# Patient Record
Sex: Female | Born: 1990
Health system: Southern US, Community
[De-identification: ages and names within clinical notes are randomized; demographics above are authoritative.]

## PROBLEM LIST (undated history)

## (undated) DIAGNOSIS — J45909 Unspecified asthma, uncomplicated: Secondary | ICD-10-CM

## (undated) DIAGNOSIS — G43909 Migraine, unspecified, not intractable, without status migrainosus: Secondary | ICD-10-CM

## (undated) DIAGNOSIS — J329 Chronic sinusitis, unspecified: Secondary | ICD-10-CM

## (undated) DIAGNOSIS — J189 Pneumonia, unspecified organism: Secondary | ICD-10-CM

## (undated) DIAGNOSIS — R569 Unspecified convulsions: Secondary | ICD-10-CM

## (undated) DIAGNOSIS — I509 Heart failure, unspecified: Secondary | ICD-10-CM

## (undated) DIAGNOSIS — R112 Nausea with vomiting, unspecified: Secondary | ICD-10-CM

## (undated) DIAGNOSIS — F419 Anxiety disorder, unspecified: Secondary | ICD-10-CM

## (undated) DIAGNOSIS — Z9889 Other specified postprocedural states: Secondary | ICD-10-CM

## (undated) DIAGNOSIS — K589 Irritable bowel syndrome without diarrhea: Secondary | ICD-10-CM

## (undated) DIAGNOSIS — I639 Cerebral infarction, unspecified: Secondary | ICD-10-CM

## (undated) DIAGNOSIS — F32A Depression, unspecified: Secondary | ICD-10-CM

## (undated) DIAGNOSIS — Z8679 Personal history of other diseases of the circulatory system: Secondary | ICD-10-CM

## (undated) DIAGNOSIS — D509 Iron deficiency anemia, unspecified: Secondary | ICD-10-CM

## (undated) DIAGNOSIS — Z8619 Personal history of other infectious and parasitic diseases: Secondary | ICD-10-CM

## (undated) DIAGNOSIS — N9489 Other specified conditions associated with female genital organs and menstrual cycle: Secondary | ICD-10-CM

## (undated) HISTORY — DX: Personal history of other diseases of the circulatory system: Z86.79

## (undated) HISTORY — PX: OTHER SURGICAL HISTORY: SHX169

## (undated) HISTORY — DX: Irritable bowel syndrome, unspecified: K58.9

## (undated) HISTORY — DX: Chronic sinusitis, unspecified: J32.9

## (undated) HISTORY — DX: Unspecified convulsions: R56.9

## (undated) HISTORY — PX: NO PAST SURGERIES: SHX2092

---

## 2013-10-22 ENCOUNTER — Emergency Department: Payer: No Typology Code available for payment source

## 2013-10-22 ENCOUNTER — Emergency Department
Admission: EM | Admit: 2013-10-22 | Discharge: 2013-10-22 | Disposition: A | Discharge status: Home / Self Care | Payer: No Typology Code available for payment source | Attending: Emergency Medicine | Admitting: Emergency Medicine

## 2013-10-22 DIAGNOSIS — F17200 Nicotine dependence, unspecified, uncomplicated: Secondary | ICD-10-CM | POA: Insufficient documentation

## 2013-10-22 DIAGNOSIS — N83209 Unspecified ovarian cyst, unspecified side: Secondary | ICD-10-CM | POA: Insufficient documentation

## 2013-10-22 DIAGNOSIS — N739 Female pelvic inflammatory disease, unspecified: Secondary | ICD-10-CM | POA: Insufficient documentation

## 2013-10-22 DIAGNOSIS — N949 Unspecified condition associated with female genital organs and menstrual cycle: Secondary | ICD-10-CM | POA: Insufficient documentation

## 2013-10-22 LAB — URINE HCG QUALITATIVE: Urine HCG Qualitative: NEGATIVE

## 2013-10-22 LAB — URINALYSIS, REFLEX TO MICROSCOPIC EXAM IF INDICATED
Bilirubin, UA: NEGATIVE
Blood, UA: NEGATIVE
Glucose, UA: NEGATIVE
Ketones UA: NEGATIVE
Leukocyte Esterase, UA: NEGATIVE
Nitrite, UA: NEGATIVE
Protein, UR: 30 — AB
Specific Gravity UA: 1.025 (ref 1.001–1.035)
Urine pH: 6 (ref 5.0–8.0)
Urobilinogen, UA: NORMAL mg/dL

## 2013-10-22 LAB — CBC AND DIFFERENTIAL
Basophils Absolute Automated: 0.03 (ref 0.00–0.20)
Basophils Automated: 0 %
Eosinophils Absolute Automated: 0.09 (ref 0.00–0.70)
Eosinophils Automated: 1 %
Hematocrit: 37.4 % (ref 37.0–47.0)
Hgb: 12.6 g/dL (ref 12.0–16.0)
Lymphocytes Absolute Automated: 2.46 (ref 0.50–4.40)
Lymphocytes Automated: 30 %
MCH: 30.1 pg (ref 28.0–32.0)
MCHC: 33.7 g/dL (ref 32.0–36.0)
MCV: 89.3 fL (ref 80.0–100.0)
MPV: 11.4 fL (ref 9.4–12.3)
Monocytes Absolute Automated: 0.79 (ref 0.00–1.20)
Monocytes: 10 %
Neutrophils Absolute: 4.75 (ref 1.80–8.10)
Neutrophils: 58 %
Platelets: 237 (ref 140–400)
RBC: 4.19 — ABNORMAL LOW (ref 4.20–5.40)
RDW: 13 % (ref 12–15)
WBC: 8.12 (ref 3.50–10.80)

## 2013-10-22 LAB — LIPASE: Lipase: 28 U/L (ref 8–78)

## 2013-10-22 LAB — COMPREHENSIVE METABOLIC PANEL
ALT: 18 U/L (ref 0–55)
AST (SGOT): 22 U/L (ref 5–34)
Albumin/Globulin Ratio: 1.3 (ref 0.9–2.2)
Albumin: 4.1 g/dL (ref 3.5–5.0)
Alkaline Phosphatase: 45 U/L (ref 40–150)
Anion Gap: 10 (ref 5.0–15.0)
BUN: 8 mg/dL (ref 7.0–19.0)
Bilirubin, Total: 0.3 mg/dL (ref 0.2–1.2)
CO2: 26 mEq/L (ref 22–29)
Calcium: 9.5 mg/dL (ref 8.5–10.5)
Chloride: 104 mEq/L (ref 98–107)
Creatinine: 0.9 mg/dL (ref 0.6–1.0)
Globulin: 3.1 g/dL (ref 2.0–3.6)
Glucose: 84 mg/dL (ref 70–100)
Potassium: 3.6 mEq/L (ref 3.5–5.1)
Protein, Total: 7.2 g/dL (ref 6.0–8.3)
Sodium: 140 mEq/L (ref 136–145)

## 2013-10-22 LAB — GFR: EGFR: >60

## 2013-10-22 MED ORDER — DOXYCYCLINE HYCLATE 100 MG PO TABS
100.0000 mg | ORAL_TABLET | Freq: Once | ORAL | Status: AC
Start: 2013-10-22 — End: 2013-10-22
  Administered 2013-10-22: 100 mg via ORAL
  Filled 2013-10-22: qty 1

## 2013-10-22 MED ORDER — IOHEXOL 350 MG/ML IV SOLN
100.0000 mL | Freq: Once | INTRAVENOUS | Status: AC | PRN
Start: 2013-10-22 — End: 2013-10-22
  Administered 2013-10-22: 100 mL via INTRAVENOUS

## 2013-10-22 MED ORDER — HYDROMORPHONE HCL PF 1 MG/ML IJ SOLN
1.0000 mg | Freq: Once | INTRAMUSCULAR | Status: AC
Start: 2013-10-22 — End: 2013-10-22
  Administered 2013-10-22: 1 mg via INTRAVENOUS
  Filled 2013-10-22: qty 1

## 2013-10-22 MED ORDER — DOXYCYCLINE HYCLATE 100 MG PO TABS
100.0000 mg | ORAL_TABLET | Freq: Two times a day (BID) | ORAL | Status: AC
Start: 2013-10-22 — End: 2013-11-05

## 2013-10-22 MED ORDER — ONDANSETRON HCL 4 MG/2ML IJ SOLN
4.0000 mg | Freq: Once | INTRAMUSCULAR | Status: AC
Start: 2013-10-22 — End: 2013-10-22
  Administered 2013-10-22: 4 mg via INTRAVENOUS
  Filled 2013-10-22: qty 2

## 2013-10-22 MED ORDER — IBUPROFEN 600 MG PO TABS
600.0000 mg | ORAL_TABLET | Freq: Four times a day (QID) | ORAL | Status: DC | PRN
Start: 2013-10-22 — End: 2014-09-20

## 2013-10-22 MED ORDER — SODIUM CHLORIDE 0.9 % IV BOLUS
1000.0000 mL | Freq: Once | INTRAVENOUS | Status: AC
Start: 2013-10-22 — End: 2013-10-22
  Administered 2013-10-22: 1000 mL via INTRAVENOUS
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
## 2013-10-22 NOTE — ED Notes (Signed)
Rectal pain for 3 days, last BM on Friday or Saturday,no rectal bleeding, pain in right lower abd for 2 days,, vomiting x1, LMP dec 13, pain with urination

## 2013-10-22 NOTE — Discharge Instructions (Signed)
Please follow up with OB/GYN on Monday.   Please return to ED in case of new or worsening symptoms.       Ovarian Cyst    You have been diagnosed with an ovarian cyst.    Ovarian cysts are a common problem in women with abdominal (belly) pain, pelvic pain, or cramping.    Ovarian cysts are a like small bubbles filled with fluid. These cysts, also known as ovarian follicles, form in the ovary during a normal menstrual cycle (period). During your cycle, an egg ripens inside the cyst to get ready for ovulation. Sometimes a follicle becomes large or many follicles form. This can cause pelvic pain or cramps.    If the cyst gets too big, it may break open. When this happens, it releases blood and fluid into the abdomen (belly). This causes sudden severe pain. This pain usually improves within a few hours without treatment. Some people need pain medication.    A few women have a condition called polycystic ovarian disease. In these women, multiple cysts form on both ovaries during the menstrual cycle. This causes irregular (unpredictable) menstrual periods. This condition is caused by hormones.    To diagnose polycystic ovarian disease, a doctor usually will examine a woman's pelvis and ask questions about her symptoms. If the doctor is unable to feel the cysts with his or her hands, an ultrasound may be necessary.    Ultrasound testing for cysts is rarely needed on an emergency basis. Instead, the test is usually done in the radiology department during regular weekday office hours. The medical staff will schedule this test in the next few days. If you prefer, you may see your regular doctor to schedule the ultrasound.    The physician has determined that it is safe for you to go home.    You may need to return here or go to the nearest Emergency Department if your symptoms signal that you are having complications like infection or bleeding.    Be sure to follow up with your gynecologist or regular doctor in the  next few days. Tell your doctor about this visit.   If you do not have a doctor or clinic to follow up with, make sure you tell the medical staff prior to leaving here today. We can help make these arrangements for you.    YOU SHOULD SEEK MEDICAL ATTENTION IMMEDIATELY, EITHER HERE OR AT THE NEAREST EMERGENCY DEPARTMENT, IF ANY OF THE FOLLOWING OCCURS:   Increasingly severe pain in the abdomen (belly), pelvis or back.   Increasingly large amounts of vaginal discharge or bleeding, or passage of large blood clots.   Fever, chills, nausea, vomiting.   Dizziness, lightheadedness, or passing out.    Pelvic Pain    You have been diagnosed with pelvic pain.    Pelvic pain affects many women who come here for evaluation. Your doctor has checked for the most dangerous causes of pelvic pain, such as appendicitis, pelvic abscess ectopic (tubal) pregnancy, or other complications of pregnancy. You do not have any of these problems.    There are many other causes of pelvic pain that cannot be detected with the type of testing that can be done here today. Some diseases, such as uterine fibroids or endometriosis, may cause abnormal vaginal bleeding along with pain. These problems often require the doctor to look at the pelvic organs in the operating room under general anesthesia.    After examining you today and conducting a few tests,  the doctor may suspect a specific cause of your pelvic pain, like infection of the uterus or fallopian tubes (known as pelvic inflammatory disease or PID). If so, treatment can be started immediately, even though it might take a few days to get all the test results back.    Cancer is a rare cause of acute pelvic pain, but it still needs to be considered as a possible cause of your symptoms. The emergency department or urgent care clinic is rarely able to diagnose or rule out cancer as the cause of pelvic pain. Cancer screening is not part of a routine emergency evaluation.    It is VERY  IMPORTANT that you follow up with your regular doctor who can evaluate all possible causes of your pain.    At this time it is safe for you to go home.    You may need to return here or go to the nearest Emergency Department if you develop symptoms that might indicate you are having complications, such as worsening infection, vaginal bleeding, or some other problem.    Be sure to follow up with your gynecologist or regular doctor in the next few days. Tell your doctor about this visit.   If you do not have a regular doctor, make sure you tell the medical staff prior to leaving here, so that we can help make these arrangements for you.    YOU SHOULD SEEK MEDICAL ATTENTION IMMEDIATELY, EITHER HERE OR AT THE NEAREST EMERGENCY DEPARTMENT, IF ANY OF THE FOLLOWING OCCURS:   Increasingly severe pain in the abdomen (belly), pelvis or back.   Increasingly large amounts of vaginal discharge or bleeding (more than one pad per hour), or passage of large blood clots.   Fever, chills, nausea, vomiting.   Dizziness, lightheadedness, or passing out.        Pelvic Inflammatory Disease    You have been diagnosed with Pelvic Inflammatory Disease, or "PID."    This is a common infection of the pelvic organs, including the uterus, fallopian tubes, ovaries and surrounding tissues.    Symptoms include irregular or no periods, pelvic pain or cramps, back pain, vaginal discharge or bleeding, nausea, vomiting, fever, chills, and urinary problems. After the infection is over you may still experience scarring of the fallopian tubes (the tubes that connect the ovaries to the uterus), ongoing pelvic pain, infertility (inability to get pregnant), or increased risk for ectopic (tubal) pregnancy.    The diagnosis is based on your symptoms and physical exam, especially your pelvic examination. Lab tests may be done to confirm the diagnosis, but often the results of these tests will not be available for several days. It is common for the  medical staff to start treatment before all lab test results are known. This is done in order to avoid a delay in your recovery. The final cause of infection will be known when all lab tests have come back.    The most common cause of "PID" is a sexually transmitted disease (STD) such as chlamydia or gonorrhea. These diseases are spread during sexual intercourse with a partner who already has the disease. If you have a female partner, he may have no symptoms or only a mild discharge from his penis. He may not even know he is infected!    Some women must be treated more than once to cure PID. Sometimes the PID is cured, but the woman becomes sick again because her sexual partner is still infected or she becomes involved with  a new partner who is also infected. If your tests indicate you do not have an STD, or if you do not get better after treatment, you may have another, less common cause of PID. It is VERY IMPORTANT that you get follow-up care. This will ensure your full recovery and allow you to discuss all the lab test results with your doctor.    If the tests confirm that you do have an STD, you will need to tell all current or recent sexual partners that they need to be treated. Avoid any sexual contact with an infected partner until he has been fully treated. Otherwise, you may become infected again.    Use "safe sex" precautions during sexual intercourse. Use a condom that contains a spermicidal jelly, in order to reduce the chance of further infections. Remember that condoms help but they cannot guarantee you will not get an infection or become pregnant.    You should be tested for other STDs that may not have been checked for today. These include syphilis, hepatitis B, and HIV (AIDS virus). You can have this testing done by your regular doctor or clinic or by going to your local community health department.    At this time it is safe for you to go home.    You may need to return here or go to the  nearest Emergency Department if you develop further symptoms, including worsening of the infection, dehydration, fevers or bleeding.    Be sure to follow up with your gynecologist or family doctor in a few days and tell your doctor about this visit.   If you do not have a regular doctor, make sure you tell the medical staff prior to leaving here today. We can help make these arrangements for you.    YOU SHOULD SEEK MEDICAL ATTENTION IMMEDIATELY, EITHER HERE OR AT THE NEAREST EMERGENCY DEPARTMENT, IF ANY OF THE FOLLOWING OCCURS:   Severe pain in the abdomen (belly), pelvis or back.   Large amounts of vaginal bleeding, soaking of pads/tampons (more than one pad per hour), or passage of large clots.   Fever, chills, nausea and vomiting.   Dizziness, lightheadedness, or passing out.

## 2013-10-27 NOTE — Progress Notes (Signed)
Quick Note:    Negative culture. No follow-up needed.  ______

## 2014-01-06 ENCOUNTER — Inpatient Hospital Stay: Payer: No Typology Code available for payment source | Admitting: Cardiovascular Disease

## 2014-01-06 ENCOUNTER — Observation Stay
Admission: EM | Admit: 2014-01-06 | Discharge: 2014-01-09 | DRG: 552 | Disposition: A | Discharge status: Home / Self Care | Payer: No Typology Code available for payment source | Attending: Internal Medicine | Admitting: Internal Medicine

## 2014-01-06 DIAGNOSIS — M79609 Pain in unspecified limb: Secondary | ICD-10-CM | POA: Diagnosis present

## 2014-01-06 DIAGNOSIS — W208XXA Other cause of strike by thrown, projected or falling object, initial encounter: Secondary | ICD-10-CM | POA: Diagnosis present

## 2014-01-06 DIAGNOSIS — G43909 Migraine, unspecified, not intractable, without status migrainosus: Secondary | ICD-10-CM | POA: Diagnosis present

## 2014-01-06 DIAGNOSIS — Y9229 Other specified public building as the place of occurrence of the external cause: Secondary | ICD-10-CM

## 2014-01-06 DIAGNOSIS — F17200 Nicotine dependence, unspecified, uncomplicated: Secondary | ICD-10-CM | POA: Diagnosis present

## 2014-01-06 DIAGNOSIS — R112 Nausea with vomiting, unspecified: Secondary | ICD-10-CM | POA: Diagnosis present

## 2014-01-06 DIAGNOSIS — R519 Headache, unspecified: Secondary | ICD-10-CM

## 2014-01-06 DIAGNOSIS — S139XXA Sprain of joints and ligaments of unspecified parts of neck, initial encounter: Principal | ICD-10-CM | POA: Diagnosis present

## 2014-01-06 DIAGNOSIS — H532 Diplopia: Secondary | ICD-10-CM | POA: Diagnosis present

## 2014-01-06 DIAGNOSIS — Z880 Allergy status to penicillin: Secondary | ICD-10-CM

## 2014-01-06 DIAGNOSIS — S0990XA Unspecified injury of head, initial encounter: Secondary | ICD-10-CM | POA: Diagnosis present

## 2014-01-06 NOTE — ED Notes (Signed)
Frontal headache, started on Saturday, + nausea, vomited on Sunday, pt reports left sided neck pain that shoots down to shoulder started today, noted episode of double vision around 1200

## 2014-01-07 ENCOUNTER — Inpatient Hospital Stay: Payer: No Typology Code available for payment source

## 2014-01-07 ENCOUNTER — Emergency Department: Payer: No Typology Code available for payment source

## 2014-01-07 DIAGNOSIS — H532 Diplopia: Secondary | ICD-10-CM | POA: Diagnosis present

## 2014-01-07 LAB — CBC AND DIFFERENTIAL
Basophils Absolute Automated: 0.03 10*3/uL (ref 0.00–0.20)
Basophils Automated: 0 %
Eosinophils Absolute Automated: 0.2 10*3/uL (ref 0.00–0.70)
Eosinophils Automated: 2 %
Hematocrit: 35.2 % — ABNORMAL LOW (ref 37.0–47.0)
Hgb: 12.3 g/dL (ref 12.0–16.0)
Lymphocytes Absolute Automated: 4.06 10*3/uL (ref 0.50–4.40)
Lymphocytes Automated: 51 %
MCH: 31.2 pg (ref 28.0–32.0)
MCHC: 34.9 g/dL (ref 32.0–36.0)
MCV: 89.3 fL (ref 80.0–100.0)
MPV: 11.8 fL (ref 9.4–12.3)
Monocytes Absolute Automated: 0.66 10*3/uL (ref 0.00–1.20)
Monocytes: 8 %
Neutrophils Absolute: 2.97 10*3/uL (ref 1.80–8.10)
Neutrophils: 38 %
Platelets: 205 10*3/uL (ref 140–400)
RBC: 3.94 10*6/uL — ABNORMAL LOW (ref 4.20–5.40)
RDW: 13 % (ref 12–15)
WBC: 7.92 10*3/uL (ref 3.50–10.80)

## 2014-01-07 LAB — URINALYSIS, REFLEX TO MICROSCOPIC EXAM IF INDICATED
Bilirubin, UA: NEGATIVE
Blood, UA: NEGATIVE
Glucose, UA: NEGATIVE
Ketones UA: NEGATIVE
Leukocyte Esterase, UA: NEGATIVE
Nitrite, UA: NEGATIVE
Protein, UR: NEGATIVE
Specific Gravity UA: 1.015 (ref 1.001–1.035)
Urine pH: 6 (ref 5.0–8.0)
Urobilinogen, UA: NORMAL mg/dL

## 2014-01-07 LAB — COMPREHENSIVE METABOLIC PANEL
ALT: 14 U/L (ref 0–55)
AST (SGOT): 17 U/L (ref 5–34)
Albumin/Globulin Ratio: 1.3 (ref 0.9–2.2)
Albumin: 3.7 g/dL (ref 3.5–5.0)
Alkaline Phosphatase: 50 U/L (ref 40–150)
Anion Gap: 7 (ref 5.0–15.0)
BUN: 13 mg/dL (ref 7.0–19.0)
Bilirubin, Total: 0.2 mg/dL (ref 0.2–1.2)
CO2: 25 mEq/L (ref 22–29)
Calcium: 8.9 mg/dL (ref 8.5–10.5)
Chloride: 108 mEq/L — ABNORMAL HIGH (ref 98–107)
Creatinine: 0.9 mg/dL (ref 0.6–1.0)
Globulin: 2.8 g/dL (ref 2.0–3.6)
Glucose: 85 mg/dL (ref 70–100)
Potassium: 3.9 mEq/L (ref 3.5–5.1)
Protein, Total: 6.5 g/dL (ref 6.0–8.3)
Sodium: 140 mEq/L (ref 136–145)

## 2014-01-07 LAB — HCG, SERUM, QUALITATIVE: Hcg Qualitative: NEGATIVE

## 2014-01-07 LAB — GFR: EGFR: >60

## 2014-01-07 MED ORDER — ONDANSETRON HCL 4 MG/2ML IJ SOLN
4.0000 mg | Freq: Once | INTRAMUSCULAR | Status: AC
Start: 2014-01-07 — End: 2014-01-07
  Administered 2014-01-07: 4 mg via INTRAVENOUS
  Filled 2014-01-07: qty 2

## 2014-01-07 MED ORDER — GADOBUTROL 1 MMOL/ML IV SOLN
6.0000 mL | Freq: Once | INTRAVENOUS | Status: AC | PRN
Start: 2014-01-07 — End: 2014-01-07
  Administered 2014-01-07: 6 mmol via INTRAVENOUS

## 2014-01-07 MED ORDER — IOHEXOL 350 MG/ML IV SOLN
100.0000 mL | Freq: Once | INTRAVENOUS | Status: AC | PRN
Start: 2014-01-07 — End: 2014-01-07
  Administered 2014-01-07: 100 mL via INTRAVENOUS

## 2014-01-07 MED ORDER — ONDANSETRON HCL 4 MG/2ML IJ SOLN
4.0000 mg | Freq: Three times a day (TID) | INTRAMUSCULAR | Status: AC | PRN
Start: 2014-01-07 — End: 2014-01-08
  Administered 2014-01-07 – 2014-01-08 (×2): 4 mg via INTRAVENOUS
  Filled 2014-01-07 (×2): qty 2

## 2014-01-07 MED ORDER — HYDROMORPHONE HCL PF 1 MG/ML IJ SOLN
0.5000 mg | Freq: Once | INTRAMUSCULAR | Status: AC
Start: 2014-01-07 — End: 2014-01-07
  Administered 2014-01-07: 0.5 mg via INTRAVENOUS
  Filled 2014-01-07: qty 1

## 2014-01-07 MED ORDER — HYDROMORPHONE HCL PF 1 MG/ML IJ SOLN
1.0000 mg | INTRAMUSCULAR | Status: AC | PRN
Start: 2014-01-07 — End: 2014-01-08
  Administered 2014-01-07 – 2014-01-08 (×3): 1 mg via INTRAVENOUS
  Filled 2014-01-07 (×3): qty 1

## 2014-01-07 MED ORDER — BUTALBITAL-APAP-CAFFEINE 50-325-40 MG PO TABS
1.0000 | ORAL_TABLET | Freq: Four times a day (QID) | ORAL | Status: DC | PRN
Start: 2014-01-07 — End: 2014-01-09
  Administered 2014-01-07 – 2014-01-09 (×4): 1 via ORAL
  Filled 2014-01-07 (×5): qty 1

## 2014-01-07 MED ORDER — HYDROMORPHONE HCL PF 1 MG/ML IJ SOLN
1.0000 mg | Freq: Once | INTRAMUSCULAR | Status: AC
Start: 2014-01-07 — End: 2014-01-07
  Administered 2014-01-07: 1 mg via INTRAVENOUS
  Filled 2014-01-07: qty 1

## 2014-01-07 MED ORDER — KETOROLAC TROMETHAMINE 30 MG/ML IJ SOLN
30.0000 mg | Freq: Once | INTRAMUSCULAR | Status: AC
Start: 2014-01-07 — End: 2014-01-07
  Administered 2014-01-07: 30 mg via INTRAVENOUS
  Filled 2014-01-07: qty 1

## 2014-01-07 NOTE — Treatment Plan (Signed)
VTE/PE Risk Screening  Complete Upon Admission and Transfer to Different Level of Care  Completed by nurse: Bevely Hackbart 01/07/2014 4:34 AM   -----------------------------------------------------------------------------------------------------------  SECTION 1 - Risk Screening     [x]   Patient currently receiving anticoagulation therapy (Heparin, Lovenox, Coumadin, Pradaxa, Xarelto, Eliquis or Arixtra Only) and received 1 dose within 24 hours of admission STOP HERE   []   VTE/PE prophylaxis currently prescribed elsewhere - STOP HERE   []   Comfort Care - STOP HERE   []   Clinical Trials - STOP HERE    Contraindications: Patients with a history of the following conditions cannot haveSequential compression devices (SCD     []  Any of these conditions present , Call MD for pharmacological prophylaxis or ask MD to document reason for not having both mechanical and pharmacologic VTE prophylaxis   []  Post-op vein ligation   []  Suspected VTE   []  Cellulitis/Dermatitis of the leg   []  Severe ischemic Vascular disease   []  Edema related to Congestive Heart Faliure   []  Gangrene   []  Recent skin graft  -----------------------------------------------------------------------------------------------------------  SECTION 2 - Risk Factors (Check all that apply)    Moderate Risk Factors   []   Heart Failure (current or history of)   []   Respiratory Failure   []   Acute Myocardial Infarction (AMI)   []   Acute Infection   []   Rheumatologic Disorder   []   Elderly age (23 years old)   []   Ongoing hormonal treatment / estrogen use (including Tamoxifen, Raloxifene)   []   Obesity (BMI >/= 30kg/m2)    High Risk Factors   []   Recent (</= 1 month) trauma/surgery    Highest Risk Factors   []   Active Cancer   []   Previous VTE   [x]   Reduced mobility (>24 hrs; current or anticipated)   []   Known thrombophilic condition (hematological disorders that promote thrombosis)      * No surgery found * []   No boxes checked in this section indicate patient is  at low risk for VTE. No VTE Prophylaxis indicated.      * No surgery found * [x]   One or more risk factors present, enter an EPIC order for Sequential compression devices (SCD). Use per protocol, MD signature required.

## 2014-01-07 NOTE — Consults (Signed)
Service Date: 01/07/2014     Patient Type: I     CONSULTING PHYSICIAN: Cathe Mons MD     REFERRING PHYSICIAN:      REASON FOR CONSULTATION:  1.  Diplopia  2.  Headache.  3.  Neck pain.  4.  Arm pain, all after recent head trauma.     HISTORY OF PRESENT ILLNESS:  The patient is a 23 year old African American lady, who got injured at her  workplace after about 10 to 12 shoe boxes fell onto her head.  At that  time, she did not feel much of symptoms.  She felt fine for the rest of the  day.  She did not lose consciousness.  The next day she started developing  bitemporal severe throbbing headache, which persistently worsened, and then  she started developing neck pain shooting into her left arm.  In addition,  she started getting diplopia and some balance and gait issues, and she came  into the emergency department.  Currently she reports no numbness or  tingling, paresthesias.  She continues to experience all the above symptoms  that I have detailed.  She reports no bowel or bladder incontinence.  She  also has some nausea and vomiting.     PAST SURGICAL HISTORY:  Nothing significant.     PAST SURGICAL HISTORY:  Nothing significant.     DRUG ALLERGIES:  PENICILLIN.     FAMILY HISTORY:  No family history of stroke or seizures.     SOCIAL HISTORY:  Light smoker.     REVIEW OF SYSTEMS:  CONSTITUTIONAL:  Fatigue.  OPHTHALMOLOGICAL:  Diplopia.  ENT:  Neck pain.  CARDIAC:  No chest pain.  RESPIRATORY:  No shortness of breath.  GASTROINTESTINAL:  Nausea, vomiting.  GENITOURINARY:  No incontinence.  MUSCULOSKELETAL:  Neck pain.  DERMATOLOGICAL:  No rashes.  HEMATOLOGICAL:  No bleeding.  ONCOLOGICAL:  Denies.  PSYCHIATRIC:  Denies.  NEUROLOGIC:  As above.     PHYSICAL EXAMINATION:  GENERAL:  Pleasant lady lying in bed in no distress.  VITAL SIGNS:  Blood pressure 122/77, pulse rate is 62, temperature 98.4,  pulse oximetry 97%.  HEENT:  No bruits.  LUNGS:  Clear.  HEART:  Heart sounds normal.  ABDOMEN:   Soft.  EXTREMITIES:  No edema.  Fundus examination was attempted, could not be  done because of photophobia.  NEUROLOGIC:  Awake, alert, oriented x3.  Speech is fluent.  Repetition and  comprehension normal.  Cranial nerves:  Eye movements normal.  Pupils  equal, reactive, though difficult to see the reaction because of her  photophobia.  Tongue midline.  Facial sensation reduced on the left side.   Motor examination reveals no drift, no dysmetria.  She was missing the  target though when checking for finger-to-nose testing.  Good strength in  the lower extremities.  Sensory exam reduced to light touch, temperature in  the left arm and the left leg.  Deep tendon reflexes are much brisker on  the left as compared to the right side.  Gait was deferred.  Toes were  downgoing.  MUSCULOSKELETAL:  Reveals mild tenderness on palpation of the paracervical  regions on the left side.  Spurling sign was negative.     IMPRESSION:  1.  Minor head trauma, closed head injury resulting in multiple  neurological symptoms.  Could be concussion, could be due to focal  neurological issue such as dissection resulting in stroke, could also be  due to HNP resulting in spinal  radiculopathy, cervical radiculopathy  primarily.  2.  Diplopia.  3.  Headaches.     RECOMMENDATIONS:  1.  Symptomatic therapy for nausea, vomiting, and headache.  2.  We will obtain CT angiogram of the head and neck to rule out  dissection.  3.  We will obtain MRI of the cervical spine to rule out HNP, resulting in  radiculopathy, or disk herniation resulting in nerve root compression.  4.  MRI of the brain has been done, and results are still pending.  We will  follow up on those results.     Thank you for the consultation.           D:  01/07/2014 08:53 AM by Dr. Cathe Mons, MD ([id number])  T:  01/07/2014 09:17 AM by Netta Cedars      Everlean Cherry: [id number]) (Doc ID: [id number])

## 2014-01-07 NOTE — ED Provider Notes (Signed)
EMERGENCY DEPARTMENT HISTORY AND PHYSICAL EXAM     Physician/Midlevel provider first contact with patient: 01/07/14 0121         Date: 01/06/2014  Patient Name: Wendy Ortiz    History of Presenting Illness     Chief Complaint   Patient presents with   . Headache   . Neck Pain       History Provided By: pt    Chief Complaint: HA  Onset: x3 nights   Timing: constant  Location: bilateral temporal area  Severity: moderate  Modifying Factors: none  Associated Symptoms: nv, double vision, photophobia    Additional History: Wendy Ortiz is a 23 y.o. female presenting to the ED c/o a bilateral temporal HA x3 days. While the pt was at work 3 days ago, a shelf full of ~10 shoeboxes fell on her head. She denies LOC and states she didn't have pain immediately after the incident. Her HA developed later that night and has been constant ever since. Her sxs are also associated with pain shooting down the L side of her neck, photophobia, intermittent double vision, and 1 episode of emesis 2 days ago. She denies back pain, numbness or weakness in her extremities, CP, SOB, abdominal pain, or any other sxs. Pt took Tylenol yesterday with no relief from her pain.      PCP: Pcp, Noneorunknown, MD      Current Facility-Administered Medications   Medication Dose Route Frequency Provider Last Rate Last Dose   . [COMPLETED] HYDROmorphone (DILAUDID) injection 0.5 mg  0.5 mg Intravenous Once Elliot Cousin, MD   0.5 mg at 01/07/14 0206   . [COMPLETED] HYDROmorphone (DILAUDID) injection 1 mg  1 mg Intravenous Once Elliot Cousin, MD   1 mg at 01/07/14 0232   . [COMPLETED] ketorolac (TORADOL) injection 30 mg  30 mg Intravenous Once Elliot Cousin, MD   30 mg at 01/07/14 0232   . [COMPLETED] ondansetron (ZOFRAN) injection 4 mg  4 mg Intravenous Once Elliot Cousin, MD   4 mg at 01/07/14 0206     Current Outpatient Prescriptions   Medication Sig Dispense Refill   . HydrOXYzine Pamoate (VISTARIL PO) Take by mouth.       Marland Kitchen ibuprofen  (ADVIL,MOTRIN) 600 MG tablet Take 1 tablet (600 mg total) by mouth every 6 (six) hours as needed for Pain or Fever.  30 tablet  0       Past History     Past Medical History:  History reviewed. No pertinent past medical history.    Past Surgical History:  History reviewed. No pertinent past surgical history.    Family History:  History reviewed. No pertinent family history.    Social History:  History   Substance Use Topics   . Smoking status: Light Tobacco Smoker   . Smokeless tobacco: Not on file   . Alcohol Use: Yes      Comment: socially        Allergies:  Allergies   Allergen Reactions   . Penicillins Anaphylaxis             Review of Systems     Review of Systems   Constitutional: Negative for fever.   Eyes: Positive for double vision and photophobia.   Respiratory: Negative for shortness of breath.    Cardiovascular: Negative for chest pain.   Gastrointestinal: Positive for nausea and vomiting. Negative for abdominal pain.   Genitourinary: Negative for hematuria.   Musculoskeletal: Negative for neck  pain.   Skin: Negative for rash.   Neurological: Positive for headaches. Negative for loss of consciousness.        -numbness/weakness   Endo/Heme/Allergies:        Penicillin allergy   Psychiatric/Behavioral: Negative for suicidal ideas.     Physical Exam   BP 122/77  Pulse 62  Temp 98.4 F (36.9 C)  Resp 16  Ht 1.651 m  Wt 66.679 kg  BMI 24.46 kg/m2  SpO2 97%  LMP 01/02/2014    Physical Exam   Constitutional: She is oriented to person, place, and time and well-developed, well-nourished, and in no distress. No distress.   HENT:   Head: Normocephalic and atraumatic.   Eyes: Conjunctivae normal and EOM are normal. Pupils are equal, round, and reactive to light. Right eye exhibits no discharge. Left eye exhibits no discharge. No scleral icterus.   Neck:        Left side paraspinal tenderness c-spine. No bony tenderness   Cardiovascular: Normal rate, regular rhythm, normal heart sounds and intact distal  pulses.    Pulmonary/Chest: Effort normal and breath sounds normal. No respiratory distress. She has no wheezes. She has no rales.   Abdominal: Soft. Bowel sounds are normal. She exhibits no distension. There is no tenderness. There is no rebound and no guarding.   Neurological: She is alert and oriented to person, place, and time. GCS score is 15.        Sensorimotor 5/5 b/l upper and lower extremities. Clear and fluent speech, no pronator drift, no facial asymmetry   Skin: Skin is warm and dry. She is not diaphoretic.   Psychiatric: Mood and affect normal.       Diagnostic Study Results     Labs -     Results     Procedure Component Value Units Date/Time    Beta HCG, Drema Dallas, Serum [[id number]] Collected:01/07/14 0150    Specimen Information:Blood Updated:01/07/14 0234     Hcg Qualitative Negative     Comprehensive Metabolic Panel (CMP) [[id number]]  (Abnormal) Collected:01/07/14 0151    Specimen Information:Blood Updated:01/07/14 0229     Glucose 85 mg/dL      BUN 78.2 mg/dL      Creatinine 0.9 mg/dL      Sodium 956 mEq/L      Potassium 3.9 mEq/L      Chloride 108 (H) mEq/L      CO2 25 mEq/L      CALCIUM 8.9 mg/dL      Protein, Total 6.5 g/dL      Albumin 3.7 g/dL      AST (SGOT) 17 U/L      ALT 14 U/L      Alkaline Phosphatase 50 U/L      Bilirubin, Total 0.2 mg/dL      Globulin 2.8 g/dL      Albumin/Globulin Ratio 1.3      Anion Gap 7.0     GFR [[id number]] Collected:01/07/14 0151     EGFR >60.0 Updated:01/07/14 0229    UA, Reflex to Microscopic [[id number]] Collected:01/07/14 0151    Specimen Information:Urine Updated:01/07/14 0222     Urine Type Clean Catch      Color, UA Yellow      Clarity, UA Clear      Specific Gravity UA 1.015      Urine pH 6.0      Leukocyte Esterase, UA Negative      Nitrite, UA Negative      Protein, UR Negative  Glucose, UA Negative      Ketones UA Negative      Urobilinogen, UA Normal mg/dL      Bilirubin, UA Negative      Blood, UA Negative     CBC and differential [[id number]]   (Abnormal) Collected:01/07/14 0151    Specimen Information:Blood / Blood Updated:01/07/14 0217     WBC 7.92 x10 3/uL      RBC 3.94 (L) x10 6/uL      Hgb 12.3 g/dL      Hematocrit 19.1 (L) %      MCV 89.3 fL      MCH 31.2 pg      MCHC 34.9 g/dL      RDW 13 %      Platelets 205 x10 3/uL      MPV 11.8 fL      Neutrophils 38 %      Lymphocytes Automated 51 %      Monocytes 8 %      Eosinophils Automated 2 %      Basophils Automated 0 %      Immature Granulocyte Unmeasured %      Neutrophils Absolute 2.97 x10 3/uL      Abs Lymph Automated 4.06 x10 3/uL      Abs Mono Automated 0.66 x10 3/uL      Abs Eos Automated 0.20 x10 3/uL      Absolute Baso Automated 0.03 x10 3/uL      Absolute Immature Granulocyte Unmeasured x10 3/uL           Radiologic Studies -   Radiology Results (24 Hour)     Procedure Component Value Units Date/Time    CT Cervical Spine without Contrast [[id number]] Collected:01/07/14 0153    Order Status:Completed  Updated:01/07/14 0201    Narrative:    EXAM:    CT Cervical Spine Without Intravenous Contrast.    CLINICAL HISTORY:    23 years old, female; Neck pain    TECHNIQUE:    Axial computed tomography images of the cervical spine without intravenous   contrast.    Coronal and sagittal reformatted images were created and reviewed.    EXAM DATE/TIME:    Exam ordered 01/07/2014 1:53 AM    COMPARISON:    No relevant prior studies available.    FINDINGS:    No acute fracture seen.      No listhesis.    Spinal laminal lines are not disrupted.    Facets anatomically aligned.    Cervical lordosis is maintained.    Mild curve concave right.    C1 posterior midline congenital fusion defect.      Impression:    IMPRESSION:       No acute fracture.      CT Head without Contrast [[id number]] Collected:01/07/14 0152    Order Status:Completed  Updated:01/07/14 0159    Narrative:    EXAM:    CT Head Without Intravenous Contrast.    CLINICAL HISTORY:    23 years old, female; Headache,  neck pain    TECHNIQUE:    Axial  computed tomography images of the head/brain without intravenous   contrast.    EXAM DATE/TIME:    Exam ordered 01/07/2014 1:52 AM    COMPARISON:    No relevant prior studies available.    FINDINGS:    Hemorrhage:  No intracranial hemorrhage.    Brain:  Unremarkable for age.    Ventricles:  No hydrocephalus. Basal cisterns preserved.  No midline shift.  Bones:  Congenital posterior fusion defect C1.    Sinuses:  Visualized portions unremarkable.    Mastoid air cells:  Aerated.      Impression:    IMPRESSION:         No acute findings.        .      Medical Decision Making   I am the first provider for this patient.    I reviewed the vital signs, available nursing notes, past medical history, past surgical history, family history and social history.    Vital Signs-Reviewed the patient's vital signs.     Patient Vitals for the past 12 hrs:   BP Temp Pulse Resp   01/07/14 0241 122/77 mmHg - 62  16    01/06/14 2232 134/72 mmHg 98.4 F (36.9 C) 76  16        Pulse Oximetry Analysis - Normal 98% on RA    Old Medical Records: Old medical records. Nursing notes.     ED Course:   2:51 AM - Spoke with Dr. Delena Bali regarding pt's case. Will accept.    3:05 AM - spoke with Dr. Francesco Sor regarding pt's case. Will consult.    Provider Notes:     Diagnosis     Clinical Impression:   1. Diplopia    2. HA (headache)        _______________________________    Attestations:  This note is prepared by Eugene Gavia, acting as a Scribe for Tomasa Rand, MD.    Tomasa Rand, MD: The scribe's documentation has been prepared under my direction and personally reviewed by me in its entirety.  I confirm that the note above accurately reflects all work, treatment, procedures, and medical decision making performed by me.        _______________________________          Elliot Cousin, MD  01/07/14 [phone]

## 2014-01-07 NOTE — Treatment Plan (Signed)
Severe Sepsis Screen  Date: 01/07/2014 Time: 4:34 AM  Nurse Signature: Chinyere Galiano    A. Infection:    Does your patient have ONE or more of the following infection criteria?     []  Documented Infection - Does the patient have positive culture results (from blood,        sputum, urine, etc)?   []  Anti-Infective Therapy - Is the patient receiving antibiotic, antifungal, or other                anti-infective therapy?   []  Pneumonia - Is there documentation of pneumonia (X Ray, etc)?   []  WBC's - Have WBC's been found in normally sterile fluid (urine, CSF, etc.)?   []  Perforated Viscus -Does the patient have a perforated hollow organ (bowel)?    A.  Did you check any of the boxes above?    [x]  No  If No, Stop Here and Sepsis Screen Negative.               []  Yes, continue to section B      B. SIRS:     Does your patient have TWO or more of the following SIRS criteria (ensure vital signs & temperature are within 1 hour of this screening)?    []  Temperature - Is the patient's temperature: Temp: 98.4 F (36.9 C) (01/06/14 2232)   - Greater than or equal to 38.3 degrees C (greater than 100.9 degrees F)?   - Less than or equal to 36 degrees C (less than or equal to 96.8 degrees F)?    []  Heart Rate: Heart Rate: 62  (01/07/14 0241)   - Is the patient's heart rate greater than or equal to 90 bpm?    []  Respiratory: Resp Rate: 16  (01/07/14 0241)   - Is the patient's respiratory rate greater than or equal to 20?    []  WBC Count - Is the patient's WBC count:   Lab 01/07/14 0151   WBC 7.92      - Greater than or equal to 12,000/mm3 OR   - Less than or equal to 4,000/mm3 OR    - Are there greater than 10% immature neutrophils (bands)?    []  Glucose >140 without diabetes?   Lab 01/07/14 0151   GLU 85       []  Significant edema?    B.  Did you check two or more of the boxes above?     []  No, Stop Here and Sepsis Screen Negative   []  Yes, continue to section C    C.  Acute Organ Dysfunction     Does your patient have ONE or more  of the following organ dysfunction? (May need to wait for lab results for assessment - see below) Organ dysfunction must be a result of the sepsis not chronic conditions.    []  Cardiovascular - Does the patient have a: BP: 122/77 mmHg (01/07/14 0241)   -systolic Blood pressure less than or equal to 90 mmHg OR   -systolic blood pressure has dropped 40 mmHg or more from baseline OR   -mean arterial pressure less than or equal to 70 mmHg (for at least one hour   despite fluid resuscitation OR require vasopressor support?  []  Respiratory - Does the patient have new hypoxia defined by any of the following"   -A sustained increase in oxygen requirements by at least 2L/min on NC or 28%    FiO2 within the last 24 hrs OR   -  A persistent decrease in oxygen saturation of greater than or equal to 5% lasting   at least four or more hours and occurring within the last 24 hours  []  Renal - Does the patient have:   -low urine output (e.g. Less than 0.3mL/kg/HR for one hour despite adequate fluid    resuscitation, OR   -Increased creatinine (greater than 50% increase from baseline) OR   -require acute dialysis?  []  Hematologic - Does the patient have a:   -Low platelet count (less than 100,000 mm3)   Lab 01/07/14 0151   PLT 205   OR   -INR/aPTT greater than upper limit of normal?No results found for this basename: INR:1 in the last 168 hours or                No results found for this basename: APTT:1 in the last 168 hours  []  Metabolic - Does the patient have a high lactate (plasma lactate greater than or equal to 2.4 mMol/L)? No results found for this basename: LACTATE:5 in the last 168 hours    []  Hepatic - Are the patient's liver enzymes elevated (ALT greater than 72 IU/L or Total      Bilirubin greater than 2 MG/dL)?    Lab 01/07/14 0151   BILITOTAL 0.2   ALT 14     []  CNS - Does the patient have altered consciousness or reduced Glasgow Coma      Scale?    C.  Did you check any of the boxes above?     []  No, Sepsis Screen  Negative   []  YES:  A) Infection + B) SIRS + C) Organ Dysfunction = Positive Screen for Severe Sepsis     Notify MD and document in Complex Assessment under provider notification   - Name of physician notified:                                           - Date/Time Notifiied:                                             Document actions: Following must be completed within 1 hour of positive sepsis screen   []  Lactate drawn (if initial lactate > , repeat lactate in 2 hours for goal decrease 10-20%)   []  Blood Cultures obtained (prior to antibiotic administration; if not done within the last 48 hours)   []  Antibiotics initiated or modified    []   IV Fluid administered 0.9% NS __________ mLs given (Initial Bolus of 30 ml/kg if SBP < 90 or MAP < 65 or lactate greater than 4 mmol/dl)  Nursing Comments?:     _________________________________________________________________    Patients meeting the following criteria are excluded from screening (check if applicable):   []  [place] hypothermia protocol   []  Comfort Care orders   []  Surgery- No screening for 24 hours after surgery (48 hours after CV surgery)       -If Yes. Date of surgery:______________________   []  Admitted with sepsis and until 72 hours after admission with documented sepsis (RESUME SEPSIS SCREEN AFTER 72 hour window!!!)      -If Yes, Date of Documented Sepsis:______________________   []  Positive screen AND Completed sepsis bundle during previous 24 hours       -  If Yes, Date/Time of positive severe sepsis screen:_______________________

## 2014-01-07 NOTE — Plan of Care (Signed)
Problem: Neurological Deficit  Goal: Neurological status is stable or improving  Outcome: Progressing  Admitted from [facility] at 0400 with complaint of double vision and headache; light sensitive; assisted to the bathroom; holding on to wall and furnitures; aware not to get out of bed without calling for help; bed alarm activated due to high fall risk; no medical history; for MRI head this morning

## 2014-01-07 NOTE — Plan of Care (Signed)
Problem: Pain  Goal: Patient's pain/discomfort is manageable  Outcome: Progressing  Patient complaint of headache. level of discomfort assessed. Patient rate the pain 7/10. Medicated with Dilaudid 1mg  given at 1124. Pain did not resolved with medication, still 7/10 mostly felt at the ride side of the head. Dr Arletha Pili aware. Verbalized will order new pain medication.

## 2014-01-07 NOTE — Treatment Plan (Signed)
Severe Sepsis Screen  Date: 01/07/2014 Time: 9:01 AM  Nurse Signature: Ethelene Hal Fankem    A. Infection:    Does your patient have ONE or more of the following infection criteria?     []  Documented Infection - Does the patient have positive culture results (from blood,        sputum, urine, etc)?   []  Anti-Infective Therapy - Is the patient receiving antibiotic, antifungal, or other                anti-infective therapy?   []  Pneumonia - Is there documentation of pneumonia (X Ray, etc)?   []  WBC's - Have WBC's been found in normally sterile fluid (urine, CSF, etc.)?   []  Perforated Viscus -Does the patient have a perforated hollow organ (bowel)?    A.  Did you check any of the boxes above?    [x]  No  If No, Stop Here and Sepsis Screen Negative.               []  Yes, continue to section B      B. SIRS:     Does your patient have TWO or more of the following SIRS criteria (ensure vital signs & temperature are within 1 hour of this screening)?    []  Temperature - Is the patient's temperature: Temp: 98.4 F (36.9 C) (01/07/14 0846)   - Greater than or equal to 38.3 degrees C (greater than 100.9 degrees F)?   - Less than or equal to 36 degrees C (less than or equal to 96.8 degrees F)?    []  Heart Rate: Heart Rate: 59  (01/07/14 0846)   - Is the patient's heart rate greater than or equal to 90 bpm?    []  Respiratory: Resp Rate: 18  (01/07/14 0846)   - Is the patient's respiratory rate greater than or equal to 20?    []  WBC Count - Is the patient's WBC count:   Lab 01/07/14 0151   WBC 7.92      - Greater than or equal to 12,000/mm3 OR   - Less than or equal to 4,000/mm3 OR    - Are there greater than 10% immature neutrophils (bands)?    []  Glucose >140 without diabetes?   Lab 01/07/14 0151   GLU 85       []  Significant edema?    B.  Did you check two or more of the boxes above?     []  No, Stop Here and Sepsis Screen Negative   []  Yes, continue to section C    C.  Acute Organ Dysfunction     Does your patient have ONE  or more of the following organ dysfunction? (May need to wait for lab results for assessment - see below) Organ dysfunction must be a result of the sepsis not chronic conditions.    []  Cardiovascular - Does the patient have a: BP: 107/61 mmHg (01/07/14 0846)   -systolic Blood pressure less than or equal to 90 mmHg OR   -systolic blood pressure has dropped 40 mmHg or more from baseline OR   -mean arterial pressure less than or equal to 70 mmHg (for at least one hour   despite fluid resuscitation OR require vasopressor support?  []  Respiratory - Does the patient have new hypoxia defined by any of the following"   -A sustained increase in oxygen requirements by at least 2L/min on NC or 28%    FiO2 within the last 24 hrs OR   -  A persistent decrease in oxygen saturation of greater than or equal to 5% lasting   at least four or more hours and occurring within the last 24 hours  []  Renal - Does the patient have:   -low urine output (e.g. Less than 0.71mL/kg/HR for one hour despite adequate fluid    resuscitation, OR   -Increased creatinine (greater than 50% increase from baseline) OR   -require acute dialysis?  []  Hematologic - Does the patient have a:   -Low platelet count (less than 100,000 mm3)   Lab 01/07/14 0151   PLT 205   OR   -INR/aPTT greater than upper limit of normal?No results found for this basename: INR:1 in the last 168 hours or                No results found for this basename: APTT:1 in the last 168 hours  []  Metabolic - Does the patient have a high lactate (plasma lactate greater than or equal to 2.4 mMol/L)? No results found for this basename: LACTATE:5 in the last 168 hours    []  Hepatic - Are the patient's liver enzymes elevated (ALT greater than 72 IU/L or Total      Bilirubin greater than 2 MG/dL)?    Lab 01/07/14 0151   BILITOTAL 0.2   ALT 14     []  CNS - Does the patient have altered consciousness or reduced Glasgow Coma      Scale?    C.  Did you check any of the boxes above?     []  No, Sepsis  Screen Negative   []  YES:  A) Infection + B) SIRS + C) Organ Dysfunction = Positive Screen for Severe Sepsis     Notify MD and document in Complex Assessment under provider notification   - Name of physician notified:                                           - Date/Time Notifiied:                                             Document actions: Following must be completed within 1 hour of positive sepsis screen   []  Lactate drawn (if initial lactate > , repeat lactate in 2 hours for goal decrease 10-20%)   []  Blood Cultures obtained (prior to antibiotic administration; if not done within the last 48 hours)   []  Antibiotics initiated or modified    []   IV Fluid administered 0.9% NS __________ mLs given (Initial Bolus of 30 ml/kg if SBP < 90 or MAP < 65 or lactate greater than 4 mmol/dl)  Nursing Comments?:     _________________________________________________________________    Patients meeting the following criteria are excluded from screening (check if applicable):   []  [place] hypothermia protocol   []  Comfort Care orders   []  Surgery- No screening for 24 hours after surgery (48 hours after CV surgery)       -If Yes. Date of surgery:______________________   []  Admitted with sepsis and until 72 hours after admission with documented sepsis (RESUME SEPSIS SCREEN AFTER 72 hour window!!!)      -If Yes, Date of Documented Sepsis:______________________   []  Positive screen AND Completed sepsis bundle during previous 24 hours       -  If Yes, Date/Time of positive severe sepsis screen:_______________________

## 2014-01-07 NOTE — H&P (Signed)
History and Physical Exam  Date Time: 01/07/2014 6:48 PM  Patient Name: Wendy Ortiz  Attending Physician: Romelle Starcher, *    Assessment:   1. Diplopia  2. Injury to head with shoe boxes at work  3. Headache  4. Neck pain, likely muscular strain  5. Nausea and vomiting  Plan:   1. Clear liquid diet  2. MRI brain done, MRI neck per Neurology  3. Pain medications  4. Neurology follow up  5. Continue home medications  6. DVT/GI prophylaxis  7. Discussed with patient and staff in detail.    Chief Complaint:   "I am having double vision"    History of Presenting Illness:   Wendy Ortiz is a 23 y.o. female  presented  to the Emergency room with above complaints.  States, the symptoms started 2 days ago. States, she works at the [employer], and got injured while at work, where she got injured by falling [employer]. States, she developed headache and neck pain that evening. Was having nausea and vomiting subsequently. Developed "double vision", the following day. States, she currently has sever headache, which gets worse by light and sound. Has history of headaches as a child. No history of migraine. No fevers or chills. Poor appetite.  No sick contacts.  Denies cough, cold, chest pain, shortness of breath. Some dizziness on standing.  Was seen and evaluated in the ER. Seen by Neurology and admitted for further workup and management.      Past Medical History:   History reviewed. No pertinent past medical history.    Past Surgical History:   History reviewed. No pertinent past surgical history.    Family History:   History reviewed. No pertinent family history.    Social History:     History     Social History   . Marital Status: Single     Spouse Name: N/A     Number of Children: N/A   . Years of Education: N/A     Social History Main Topics   . Smoking status: Light Tobacco Smoker   . Smokeless tobacco: Not on file   . Alcohol Use: Yes      Comment: socially    . Drug Use: No   . Sexually Active: Not  on file     Other Topics Concern   . Not on file     Social History Narrative   . No narrative on file     History   Smoking status   . Light Tobacco Smoker   Smokeless tobacco   . Not on file     History   Alcohol Use   . Yes     Comment: socially      History   Drug Use No       Allergies:     Allergies   Allergen Reactions   . Penicillins Anaphylaxis             Medications:     Prescriptions prior to admission   Medication Sig   . HydrOXYzine Pamoate (VISTARIL PO) Take by mouth.   Marland Kitchen ibuprofen (ADVIL,MOTRIN) 600 MG tablet Take 1 tablet (600 mg total) by mouth every 6 (six) hours as needed for Pain or Fever.  Current/Home Medications    HYDROXYZINE PAMOATE (VISTARIL PO)    Take by mouth.    IBUPROFEN (ADVIL,MOTRIN) 600 MG TABLET    Take 1 tablet (600 mg total) by mouth every 6 (six) hours as needed for Pain or Fever.      Review of Systems:   Constitutional: Negative for fever, chills, weight loss, malaise/fatigue and diaphoresis.   HEENT: double vision, headache, neck pain  Respiratory: Denies cough or shortness of breath  Cardiovascular: No Chest pain. No shortness of breath. Negative for palpitations.   Gastrointestinal: No abdominal pain. Nausea, vomiting, 2 days ago. No diarrhea or  constipation.   Genitourinary: No burning micturition.   Musculoskeletal: Negative.   Skin: Negative.   Neurological: headache, nausea and double vision. Negative for focal  weakness.   Endo/Heme/Allergies: Does not bruise/bleed easily.  Physical Exam:     Filed Vitals:    01/07/14 1834   BP: 109/52   Pulse: 48   Temp: 97.3 F (36.3 C)   Resp: 19   SpO2: 99%        Temp (24hrs), Avg:97.6 F (36.4 C), Min:96.8 F (36 C), Max:98.4 F (36.9 C)    Patient Vitals for the past 24 hrs:   BP Temp Temp src Pulse Resp SpO2 Height Weight   01/07/14 1834 109/52 mmHg 97.3 F (36.3 C) Oral 48  19  99 % - -   01/07/14 1537 104/59 mmHg 96.8 F (36 C) Oral 53  18  96 % - -   01/07/14 1106  110/59 mmHg 97.2 F (36.2 C) Oral 66  18  100 % - -   01/07/14 0846 107/61 mmHg 98.4 F (36.9 C) Oral 59  18  98 % - -   01/07/14 0519 120/68 mmHg 97.2 F (36.2 C) - 56  18  97 % - -   01/07/14 0241 122/77 mmHg - - 62  16  97 % - -   01/06/14 2232 134/72 mmHg 98.4 F (36.9 C) - 76  16  98 % 1.651 m (5\' 5" ) 66.679 kg (147 lb)     Body mass index is 24.46 kg/(m^2).  No intake or output data in the 24 hours ending 01/07/14 1848  General: awake, alert, oriented x 3; no acute distress.  HEENT: perrla, eomi, sclera anicteric  oropharynx clear without lesions, mucous membranes moist  Neck: supple, no lymphadenopathy, no thyromegaly, no JVD, no carotid bruits  Cardiovascular: regular rate and rhythm, no murmurs, rubs or gallops  Lungs: clear to auscultation bilaterally, without wheezing, rhonchi, or rales  Abdomen: soft, non-tender, non-distended; no palpable masses.  Extremities: no clubbing, cyanosis, or edema  Neuro: cranial nerves grossly intact, strength 5/5 in upper and lower extremities, sensation intact,   Skin: no rashes or lesions noted  Labs:.      Results     Procedure Component Value Units Date/Time    Beta HCG, Drema Dallas, Serum [[id number]] Collected:01/07/14 0150    Specimen Information:Blood Updated:01/07/14 0234     Hcg Qualitative Negative     Comprehensive Metabolic Panel (CMP) [[id number]]  (Abnormal) Collected:01/07/14 0151    Specimen Information:Blood Updated:01/07/14 0229     Glucose 85 mg/dL      BUN 78.2 mg/dL      Creatinine 0.9 mg/dL      Sodium 956 mEq/L      Potassium 3.9 mEq/L      Chloride 108 (H) mEq/L      CO2 25 mEq/L      CALCIUM  8.9 mg/dL      Protein, Total 6.5 g/dL      Albumin 3.7 g/dL      AST (SGOT) 17 U/L      ALT 14 U/L      Alkaline Phosphatase 50 U/L      Bilirubin, Total 0.2 mg/dL      Globulin 2.8 g/dL      Albumin/Globulin Ratio 1.3      Anion Gap 7.0     GFR [[id number]] Collected:01/07/14 0151     EGFR >60.0 Updated:01/07/14 0229    UA, Reflex to Microscopic [[id number]]  Collected:01/07/14 0151    Specimen Information:Urine Updated:01/07/14 0222     Urine Type Clean Catch      Color, UA Yellow      Clarity, UA Clear      Specific Gravity UA 1.015      Urine pH 6.0      Leukocyte Esterase, UA Negative      Nitrite, UA Negative      Protein, UR Negative      Glucose, UA Negative      Ketones UA Negative      Urobilinogen, UA Normal mg/dL      Bilirubin, UA Negative      Blood, UA Negative     CBC and differential [[id number]]  (Abnormal) Collected:01/07/14 0151    Specimen Information:Blood / Blood Updated:01/07/14 0217     WBC 7.92 x10 3/uL      RBC 3.94 (L) x10 6/uL      Hgb 12.3 g/dL      Hematocrit 08.6 (L) %      MCV 89.3 fL      MCH 31.2 pg      MCHC 34.9 g/dL      RDW 13 %      Platelets 205 x10 3/uL      MPV 11.8 fL      Neutrophils 38 %      Lymphocytes Automated 51 %      Monocytes 8 %      Eosinophils Automated 2 %      Basophils Automated 0 %      Immature Granulocyte Unmeasured %      Neutrophils Absolute 2.97 x10 3/uL      Abs Lymph Automated 4.06 x10 3/uL      Abs Mono Automated 0.66 x10 3/uL      Abs Eos Automated 0.20 x10 3/uL      Absolute Baso Automated 0.03 x10 3/uL      Absolute Immature Granulocyte Unmeasured x10 3/uL         Rads:     Radiology Results (24 Hour)     Procedure Component Value Units Date/Time    CT Angiogram Head [[id number]] Collected:01/07/14 1124    Order Status:Completed  Updated:01/07/14 1130    Narrative:    INDICATION: Head trauma. Headache. Diplopia. Possible dissection.    TECHNIQUE: A spiral acquisition of the head was obtained following  administration of intravenous contrast. The dataset was reconstructed  into a CT angiogram. 3-Ortiz volume-rendered images were obtained . The  patient was injected with 100 cc of contrast.    FINDINGS: The anterior and middle cerebral arteries are within normal  limits. The basilar artery and posterior cerebral arteries are  unremarkable. No vascular stenosis or occlusion is noted. No aneurysm  or  arteriovenous malformation is identified.      Impression:     Unremarkable CTA of the head. No dissection is  demonstrated.    Merri Ray, MD   01/07/2014 11:26 AM      CT Angiogram Neck [[id number]] Collected:01/07/14 1122    Order Status:Completed  Updated:01/07/14 1128    Narrative:    INDICATION: Neck pain. Diplopia. Possible dissection.    TECHNIQUE: A spiral acquisition of the neck was obtained following the  administration of intravenous contrast. The data set was reconstructed  into a CT angiogram. 3-Ortiz volume-rendered images were obtained. The  patient was injected with 100 cc of contrast.    FINDINGS: No stenosis is demonstrated involving the right common carotid  artery, right internal carotid artery and right external carotid artery.    There is no evidence of stenosis involving the left common carotid  artery, left internal carotid artery and left external carotid artery.    The vertebral arteries are similar in size bilaterally. No vertebral  artery stenosis is noted.      Impression:     No vascular stenosis is demonstrated. No dissection is  identified.    Merri Ray, MD   01/07/2014 11:24 AM      CT Head W WO Contrast [[id number]] Collected:01/07/14 1058    Order Status:Completed  Updated:01/07/14 1105    Narrative:    INDICATION: Diplopia.    TECHNIQUE: Multiple transaxial images of head were obtained before and  after the administration intravenous contrast.  The patient was injected  with 100 cc of contrast.    FINDINGS: No intracranial mass lesion, hemorrhage, or territorial  infarction is demonstrated. The ventricular system and subarachnoid  spaces are within normal limits. No regions of abnormal density are  noted within the brain parenchyma. Following contrast administration, no  abnormal enhancement is identified.     There is a 7 mm soft tissue density in the right sphenoid sinus. This  may represent a mucous retention cyst or polyp.      Impression:     Unremarkable CT of the brain  with and without contrast.     Merri Ray, MD   01/07/2014 11:00 AM      MRI Brain with/without Contrast [[id number]] Collected:01/07/14 0847    Order Status:Completed  Updated:01/07/14 2355    Narrative:    INDICATION: Headache. Diplopia.    TECHNIQUE: Axial T1-weighted, FLAIR, T2*, and  T2-weighted images of the  brain were obtained along with sagittal T1-weighted images. An axial  diffusion-weighted sequence was obtained along with an ADC map.  Following  the administration of 6 cc of gadolinium, T1-weighted axial  and coronal images were obtained.    FINDINGS: No intracranial mass lesion, hemorrhage, or territorial  infarction is demonstrated. The ventricular system and subarachnoid  spaces are within normal limits. No regions of abnormal signal intensity  are noted within the brain parenchyma. Diffusion-weighted images show no  regions of restricted diffusion.  Following contrast administration, no  abnormal enhancement is noted. Mucous retention cysts are noted in the  right maxillary sinus and the right sphenoid sinus.      Impression:     Unremarkable MRI of the brain with and without contrast.    Merri Ray, MD   01/07/2014 8:52 AM      CT Cervical Spine without Contrast [[id number]] Collected:01/07/14 0153    Order Status:Completed  Updated:01/07/14 0757    Narrative:    Clinical history:neck pain    TECHNIQUE: CT images of the cervical spine were performed with  contiguous sections and multiplanar reconstructions were performed.    FINDINGS: CT  scan of the cervical spine demonstrates good alignment  cervical vertebral bodies without evidence of fracture dislocation or  subluxation. The precervical soft tissues appear to be unremarkable. The  morphology of the thyroid appears to be normal. 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      Impression:      Normal CT scan of the cervical spine and multiplanar reconstructions  were included in the analysis.  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    Hal Hope, MD   01/07/2014 7:53 AM      CT Head without Contrast [[id number]] Collected:01/07/14 0152    Order Status:Completed  Updated:01/07/14 5366    Narrative:    CT HEAD    CLINICAL STATEMENT:headache, neck pain     COMPARISON: No prior studies are available for comparison.     TECHNIQUE: Multiple 5 mm thickness axial images were obtained from the  skull base to the vertex without IV contrast administration.    FINDINGS: Gray-white matter junction differentiation is appropriate. No  intracranial hemorrhage is identified. The ventricles are appropriate in  size, shape and are midline. There is no intracranial mass, mass-effect  or midline shift. No abnormal intra or extra-axial fluid collections are  identified.     The visualized paranasal sinuses and mastoid air cells are well-aerated.  The osseous calvarium is intact.      Impression:      No CT evidence of an acute intracranial abnormality.    Hal Hope, MD   01/07/2014 7:50 AM            Signed by: Romelle Starcher, MD

## 2014-01-07 NOTE — Treatment Plan (Signed)
Severe Sepsis Screen  Date: 01/07/2014 Time: 7:44 PM  Nurse Signature: Bernhardt Riemenschneider    A. Infection:    Does your patient have ONE or more of the following infection criteria?     []  Documented Infection - Does the patient have positive culture results (from blood,        sputum, urine, etc)?   []  Anti-Infective Therapy - Is the patient receiving antibiotic, antifungal, or other                anti-infective therapy?   []  Pneumonia - Is there documentation of pneumonia (X Ray, etc)?   []  WBC's - Have WBC's been found in normally sterile fluid (urine, CSF, etc.)?   []  Perforated Viscus -Does the patient have a perforated hollow organ (bowel)?    A.  Did you check any of the boxes above?    [x]  No  If No, Stop Here and Sepsis Screen Negative.               []  Yes, continue to section B      B. SIRS:     Does your patient have TWO or more of the following SIRS criteria (ensure vital signs & temperature are within 1 hour of this screening)?    []  Temperature - Is the patient's temperature: Temp: 97.3 F (36.3 C) (01/07/14 1834)   - Greater than or equal to 38.3 degrees C (greater than 100.9 degrees F)?   - Less than or equal to 36 degrees C (less than or equal to 96.8 degrees F)?    []  Heart Rate: Heart Rate: 48  (01/07/14 1834)   - Is the patient's heart rate greater than or equal to 90 bpm?    []  Respiratory: Resp Rate: 19  (01/07/14 1834)   - Is the patient's respiratory rate greater than or equal to 20?    []  WBC Count - Is the patient's WBC count:   Lab 01/07/14 0151   WBC 7.92      - Greater than or equal to 12,000/mm3 OR   - Less than or equal to 4,000/mm3 OR    - Are there greater than 10% immature neutrophils (bands)?    []  Glucose >140 without diabetes?   Lab 01/07/14 0151   GLU 85       []  Significant edema?    B.  Did you check two or more of the boxes above?     []  No, Stop Here and Sepsis Screen Negative   []  Yes, continue to section C    C.  Acute Organ Dysfunction     Does your patient have ONE or more  of the following organ dysfunction? (May need to wait for lab results for assessment - see below) Organ dysfunction must be a result of the sepsis not chronic conditions.    []  Cardiovascular - Does the patient have a: BP: 109/52 mmHg (01/07/14 1834)   -systolic Blood pressure less than or equal to 90 mmHg OR   -systolic blood pressure has dropped 40 mmHg or more from baseline OR   -mean arterial pressure less than or equal to 70 mmHg (for at least one hour   despite fluid resuscitation OR require vasopressor support?  []  Respiratory - Does the patient have new hypoxia defined by any of the following"   -A sustained increase in oxygen requirements by at least 2L/min on NC or 28%    FiO2 within the last 24 hrs OR   -  A persistent decrease in oxygen saturation of greater than or equal to 5% lasting   at least four or more hours and occurring within the last 24 hours  []  Renal - Does the patient have:   -low urine output (e.g. Less than 0.76mL/kg/HR for one hour despite adequate fluid    resuscitation, OR   -Increased creatinine (greater than 50% increase from baseline) OR   -require acute dialysis?  []  Hematologic - Does the patient have a:   -Low platelet count (less than 100,000 mm3)   Lab 01/07/14 0151   PLT 205   OR   -INR/aPTT greater than upper limit of normal?No results found for this basename: INR:1 in the last 168 hours or                No results found for this basename: APTT:1 in the last 168 hours  []  Metabolic - Does the patient have a high lactate (plasma lactate greater than or equal to 2.4 mMol/L)? No results found for this basename: LACTATE:5 in the last 168 hours    []  Hepatic - Are the patient's liver enzymes elevated (ALT greater than 72 IU/L or Total      Bilirubin greater than 2 MG/dL)?    Lab 01/07/14 0151   BILITOTAL 0.2   ALT 14     []  CNS - Does the patient have altered consciousness or reduced Glasgow Coma      Scale?    C.  Did you check any of the boxes above?     []  No, Sepsis Screen  Negative   []  YES:  A) Infection + B) SIRS + C) Organ Dysfunction = Positive Screen for Severe Sepsis     Notify MD and document in Complex Assessment under provider notification   - Name of physician notified:                                           - Date/Time Notifiied:                                             Document actions: Following must be completed within 1 hour of positive sepsis screen   []  Lactate drawn (if initial lactate > , repeat lactate in 2 hours for goal decrease 10-20%)   []  Blood Cultures obtained (prior to antibiotic administration; if not done within the last 48 hours)   []  Antibiotics initiated or modified    []   IV Fluid administered 0.9% NS __________ mLs given (Initial Bolus of 30 ml/kg if SBP < 90 or MAP < 65 or lactate greater than 4 mmol/dl)  Nursing Comments?:     _________________________________________________________________    Patients meeting the following criteria are excluded from screening (check if applicable):   []  [place] hypothermia protocol   []  Comfort Care orders   []  Surgery- No screening for 24 hours after surgery (48 hours after CV surgery)       -If Yes. Date of surgery:______________________   []  Admitted with sepsis and until 72 hours after admission with documented sepsis (RESUME SEPSIS SCREEN AFTER 72 hour window!!!)      -If Yes, Date of Documented Sepsis:______________________   []  Positive screen AND Completed sepsis bundle during previous 24 hours       -  If Yes, Date/Time of positive severe sepsis screen:_______________________

## 2014-01-07 NOTE — Progress Notes (Signed)
Patient seen and examined. Full consult dictated.pt with multiple neur symptms and abnormal neurological exam --will need CTA head and neck and C spine MRI as well       Cathe Mons, MD

## 2014-01-08 MED ORDER — MAGNESIUM SULFATE IN D5W 10-5 MG/ML-% IV SOLN
1.0000 g | Freq: Two times a day (BID) | INTRAVENOUS | Status: DC
Start: 2014-01-08 — End: 2014-01-09
  Administered 2014-01-08 – 2014-01-09 (×3): 1 g via INTRAVENOUS
  Filled 2014-01-08 (×3): qty 100

## 2014-01-08 MED ORDER — VALPROATE SODIUM 100 MG/ML IV SOLN
500.0000 mg | Freq: Three times a day (TID) | INTRAVENOUS | Status: DC
Start: 2014-01-08 — End: 2014-01-09
  Administered 2014-01-08 – 2014-01-09 (×3): 500 mg via INTRAVENOUS
  Filled 2014-01-08 (×4): qty 5

## 2014-01-08 NOTE — Plan of Care (Signed)
Problem: Safety  Goal: Patient will be free from injury during hospitalization  Outcome: Progressing  Repeatedly instructed not to get out of bed without calling for help; bed alarm is on; assisted to the bathroom x2; reports dizziness; went for cervical MRI last night    Problem: Neurological Deficit  Goal: Neurological status is stable or improving  Outcome: Progressing  Medicated 2x with fioricet and dilaudid x1 for headache;  zofran given for nausea from dilaudid; states vision still blurry but double vision is getting better; still gets lightheaded when getting up or laying down

## 2014-01-08 NOTE — Progress Notes (Signed)
Progress Note    Date Time: 01/08/2014 11:29 AM  Patient Name: Wendy Ortiz  Attending Physician: Romelle Starcher, *      Assessment & Plan:   23 yo female with hx migraine headaches now s/p head trauma with associated headaches, diplopia, poor balance.  Pt is now c/o photophobia as well.  MRI brain and cervical spine are normal    -  Will treat as migraine given now photophobia  -  Start Depacon and magnesium sulfate  -  Check standing BP    Subjective:   Patient Seen and Examined. The notes from the last 24 hours were reviewed.   Pt still with diplopia.  Also says she is LH upon standing    Review of Systems:   No headache, eye, ear nose, throat problems; no coughing or wheezing or shortness of breath, No chest pain or orthopnea, no abdominal pain, nausea or vomiting, No pain in the body or extremities, no psychiatric, neurological, endocrine, hematological or cardiac complaints except as noted above.     Physical Exam:   Blood pressure 109/69, pulse 48, temperature 97.3 F (36.3 C), temperature source Oral, resp. rate 18, height 1.651 m (5\' 5" ), weight 66.679 kg (147 lb), last menstrual period 01/02/2014, SpO2 99.00%.    HEENT: Normocephalic. No icter or congestion  Neck: supple, no lymphadenopathy, no thyromegaly, no JVD  Extremities: no clubbing, cyanosis, or edema  Skin: no rashes or lesions noted    Neuro:  Level of consciousness:  Alert but hypophonic  C/o diplopia with gaze in all direction - no change in degree  Facial Movements: symmetric  Strength:  No upper extremity drift  Sensation to light touch: Intact bilaterally      Meds:      Scheduled Meds: PRN Meds:         Continuous Infusions:         butalbital-acetaminophen-caffeine 1 tablet Q6H PRN   [EXPIRED] HYDROmorphone 1 mg Q4H PRN   [EXPIRED] ondansetron 4 mg Q8H PRN           I personally reviewed all of the medications    Labs:     Lab 01/07/14 0151   GLU 85   BUN 13.0   CREAT 0.9   CA 8.9   NA 140   K 3.9   CL 108*   CO2 25   ALB  3.7   PHOS --   MG --   AST 17   ALT 14   BILITOTAL 0.2   ALKPHOS 50       Lab 01/07/14 0151   WBC 7.92   HGB 12.3   HCT 35.2*   MCV 89.3   MCH 31.2   MCHC 34.9   PLT 205         No results found for this basename: PTT:2,PT:2,INR:2 in the last 72 hours       Radiology Results (24 Hour)     Procedure Component Value Units Date/Time    MRI Cervical Spine WO Contrast [[id number]] Collected:01/08/14 0840    Order Status:Completed  Updated:01/08/14 0846    Narrative:    MRI CERVICAL SPINE:     INDICATION: head trauma and neck pain    TECHNIQUE: Multisequence, multiplanar MRI of the cervical spine was  performed without intravenous contrast administration.  Obtained  sequences include sagittal T1, sagittal T2, sagittal STIR, and axial T2  weighted.      COMPARISON: None.     CORRELATION: CT cervical spine dated  01/07/2014.    FINDINGS: The study is limited by artifacts related patient motion.  There is diffusely intermediate T1 marrow signal, nonspecific, but most  commonly reflects prominent red marrow which can be seen in a variety of  clinical states such as obesity and anemia.  Straightening of the cervical spine is noted and may be positional or  due to spasm. No significant disc height loss is demonstrated. No cord  signal abnormality is present. No significant neuroforaminal narrowing  or central canal stenosis demonstrated. No significant disc protrusion  or disc bulges are present. The vertebral body heights are maintained.  Visualized portions of the posterior fossa are unremarkable. Retention  cyst or polyp is noted within the right maxillary sinus.      Impression:     No acute findings. No disc protrusions, neuroforaminal  narrowing, or central canal stenosis.      CT Angiogram Head [[id number]] Collected:01/07/14 1124    Order Status:Completed  Updated:01/07/14 1130    Narrative:    INDICATION: Head trauma. Headache. Diplopia. Possible dissection.    TECHNIQUE: A spiral acquisition of the head was obtained  following  administration of intravenous contrast. The dataset was reconstructed  into a CT angiogram. 3-Ortiz volume-rendered images were obtained . The  patient was injected with 100 cc of contrast.    FINDINGS: The anterior and middle cerebral arteries are within normal  limits. The basilar artery and posterior cerebral arteries are  unremarkable. No vascular stenosis or occlusion is noted. No aneurysm or  arteriovenous malformation is identified.      Impression:     Unremarkable CTA of the head. No dissection is demonstrated.    Merri Ray, MD   01/07/2014 11:26 AM             All recent brain and spine imaging (MRI, CT) personally reviewed.    Case discussed with: Dr. Francesco Sor, patient, family    20 minutes; >50% time spent in counseling or coordination of care    Signed by: Ardelle Anton, MD  Spectralink: [phone]       Answering Service: [phone]

## 2014-01-08 NOTE — Progress Notes (Signed)
Progress Notes    Date Time: 01/08/2014 8:30 PM  Patient Name: Wendy Ortiz, Wendy Ortiz     Patient Active Problem List   Diagnosis   . Diplopia     Assessment/Plan:   1. Migraine headaches  2. Diplopia/Photophobia  3. Injury to head with shoe boxes at work  4. Neck pain, likely muscular strain  5. Nausea and vomiting, resolved  Plan:    1. Clear liquid diet  2. MRI brain and  neck, negative  3. Neurology follow up  4. Continue home medications  5. DVT/GI prophylaxis  6. Discussed with patient and staff in detail.      Plan discussed with Patient/Family/Staff/Consultants involved.  Subjective:   Doing well  Headache better  Appetite poor    Review of Systems:   Constitutional: Negative for fever, chills, weight loss, malaise/fatigue and diaphoresis.   HEENT: No headache, dizziness, nausea or  vomiting.   Respiratory: Denies cough, shortness of breath.   Cardiovascular: No Chest pain. No shortness of breath. Negative for palpitations.   Gastrointestinal: No abdominal pain, No nausea, vomiting, diarrhea or  constipation.   Genitourinary: No burning micturition.   Musculoskeletal: Negative.   Neurological:  Headache +, no nausea.Negative for weakness.   Endo/Heme/Allergies: Does not bruise/bleed easily.  Medications:     Current Facility-Administered Medications   Medication Dose Route Frequency Provider Last Rate Last Dose   . butalbital-acetaminophen-caffeine (FIORICET, ESGIC) per tablet 1 tablet  1 tablet Oral Q6H PRN Fawaz Borquez R, MD   1 tablet at 01/08/14 1017   . [EXPIRED] HYDROmorphone (DILAUDID) injection 1 mg  1 mg Intravenous Q4H PRN Elliot Cousin, MD   1 mg at 01/08/14 0039   . magnesium sulfate 1g in dextrose 5% IVPB (premix)  1 g Intravenous Q12H [facility] Ardelle Anton, MD 100 mL/hr at 01/08/14 1255 1 g at 01/08/14 1255   . [EXPIRED] ondansetron (ZOFRAN) injection 4 mg  4 mg Intravenous Q8H PRN Elliot Cousin, MD   4 mg at 01/08/14 0039   . valproate (DEPACON) 500 mg in sodium chloride 0.9 % 100 mL  IVPB  500 mg Intravenous Q8H Ardelle Anton, MD 100 mL/hr at 01/08/14 1438 500 mg at 01/08/14 1438       Physical Exam:     Filed Vitals:    01/08/14 1943   BP: 117/59   Pulse: 53   Temp: 97 F (36.1 C)   Resp: 18   SpO2: 100%     Temp (24hrs), Avg:97.2 F (36.2 C), Min:96.7 F (35.9 C), Max:98.1 F (36.7 C)    Intake and Output Summary (Last 24 hours) at Date Time  No intake or output data in the 24 hours ending 01/08/14 2030  General Appearance: No acute distress.  Head:  Normocephalic  Eyes:  No pallor, cyanosis, icterus, PERLA, EOM's intact.  Neck:  Supple, No JVD  Lungs:  Clear to auscultation bilaterally, no wheezes, rhonchi or rales.  Heart:  S1, S2 normal, no murmurs.  Abdomen:  Soft, non-tender, non-distended, positive bowel sounds.  Extremities:  No cyanosis, clubbing or edema.  Neurologic:  Alert and oriented x3, mood and affect normal. Cranial Nerves II-XII grossly intact.    Labs:     Results     ** No Results found for the last 24 hours. **          Rads:     Radiology Results (24 Hour)     Procedure Component Value Units Date/Time  MRI Cervical Spine WO Contrast [[id number]] Collected:01/08/14 0840    Order Status:Completed  Updated:01/08/14 1614    Narrative:    MRI CERVICAL SPINE:     INDICATION: head trauma and neck pain    TECHNIQUE: Multisequence, multiplanar MRI of the cervical spine was  performed without intravenous contrast administration.  Obtained  sequences include sagittal T1, sagittal T2, sagittal STIR, and axial T2  weighted.      COMPARISON: None.     CORRELATION: CT cervical spine dated 01/07/2014.    FINDINGS: The study is limited by artifacts related patient motion.  There is diffusely intermediate T1 marrow signal, nonspecific, but most  commonly reflects prominent red marrow which can be seen in a variety of  clinical states such as obesity and anemia.  Straightening of the cervical spine is noted and may be positional or  due to spasm. No significant disc height loss is  demonstrated. No cord  signal abnormality is present. No significant neuroforaminal narrowing  or central canal stenosis demonstrated. No significant disc protrusion  or disc bulges are present. The vertebral body heights are maintained.  Visualized portions of the posterior fossa are unremarkable. Retention  cyst or polyp is noted within the right maxillary sinus.      Impression:     No acute findings. No disc protrusions, neuroforaminal  narrowing, or central canal stenosis. Reversal of the cervical lordosis  which may be positional or due to spasm.    Gustavus Messing, MD   01/08/2014 4:10 PM            Signed by: Romelle Starcher

## 2014-01-08 NOTE — Plan of Care (Signed)
Problem: Health Promotion  Goal: Vaccination Screening  All patients will be screened for current vaccination status on each admission.   Outcome: Completed Date Met:  01/08/14  Vaccine screening completed.  No vaccines needed.    Problem: Pain  Goal: Patient's pain/discomfort is manageable  Outcome: Progressing  Reports continued headache.    Problem: Moderate/High Fall Risk Score >/=15  Goal: Patient will remain free of falls  Outcome: Progressing  Free from falls.  Bed alarm on.  Intentional hourly rounding used.  Call bell within reach    Problem: Neurological Deficit  Goal: Neurological status is stable or improving  Outcome: Progressing  Reports continued light sensitivity, double vission and headache.  Balance off.  Will continue to monitor.

## 2014-01-08 NOTE — Progress Notes (Signed)
Case Management Initial Discharge Planning Assessment    PLAN OF CARE:   3/11 Note from yesterday: Pt from home/self-care, admitted for headache and neck pain. Per initial assessment, pt appears to have child-like behaviors, possible underlying psych issues? According to the pt she works 1 full time and 2 part-time jobs and currently resides with her mother. Pt does not have a PCP, but lives in [place]. CM will investigate to see which PCP in her area is closest and accepts her insurance. CM will continue to follow.       Psychosocial/Demographic Information   Name of interviewee: Wendy Ortiz, Wendy Ortiz   Healthcare Decision Maker (HDM) (if other than the patient) Riesa Pope   HDM - Relationship to Patient Mother   HDM - Contact Information [phone]   Pt lives with Mother   Type of residence where patient lives Living Arrangements: Parent]   ]   ]  House w/mother   Prior level of functioning (ambulation & ADLs)  ]   ]  Ambulatory   Correct Insurance listed on face sheet - verified with the patient/HDM [facility]   Any additional emergency contacts? Extended Emergency Contact Information  Primary Emergency Contact: Riesa Pope  Address: [address]           [place]  [address] [place]  Home Phone: [phone]  Relation: Mother  [phone]   Does the patient have an Advance Directive? <no information>  Advance Directive: Patient does not have advance directive]     Is the POA/Guardianship documentation in shadow chart? (if applicable)  Healthcare Agent Appointed: Yes]  Healthcare Agent's Name: Agustin Cree (mom)]  Healthcare Agent's Phone Number: [phone]]   Source of Income (SSDI. SSI. Social Security, pension, employment, Catering manager) Full time and 2 part-time jobs     [employer]  Name of Primary Care Physician verified in patient banner (update in patient banner if not listed).  If no PCP, call [phone] with patient in room to get them connected with a  PCP. Pcp, Noneorunknown, [place]  None  Pt states she does not have a PCP, CM will investigate a PCP under her insurance and schedule a follow up appointment.    What DME does the patient currently own/have at home? (rolling walker, hospital bed, home O2, BiPAP/CPAP, bedside commode, cane, hoyer lift)  ]  Assistive Devices: None]   ]   Has the patient been to an Acute Rehab or SNF in the past?  If so, where? No   Does the patient currently have home health or hospice/palliative services in place?  If so, list agency name. No   Would the patient benefit from a PT/OT order? If so, is it ordered? No   Does the patient already have community dialysis set up?  If so, where? No     Readmission Assessment  Current LACE Score Reviewed? Yes/No Yes 8   Is this patient an inpatient to inpatient 30 day readmission? No   Does the patient have difficulty obtaining his/her medications? No   Follow-up appt made with: [facility], [facility] or private pcp? CM will make an appt in [place] for the pt to follow up.   Does the patient have difficulty getting to his/her appointment? No       Anticipated Discharge Plan  Discussed Anticipated Discharge Date and Discharge Disposition Possibilities with: _X__Patient   ___Healthcare Decision Maker  ___Other   Anticipated Disposition: Option A Return to home  Anticipated Disposition: Option B    Who will transport the patient when ready for discharge? (offer wheelchair Zenaida Niece service if patient/family cannot identify transport plan) Pt's mother.    If applicable, were SNF or Hospice choices provided? No   Palliative Care Consult needed? (if yes, contact attending [place])  No   Geriatrics Consult needed? (if yes, contact attending [place]) No   Elderlink Referral needed? (if yes, refer through [facility]) No   TCM Referral needed? (if yes, refer through [facility]) No   PACE Referral needed? (if yes, refer through [facility]) No   Are there any potential barriers to discharge identified?      ___Lack of  Insurance  ___Lack of Health Literacy  ___Undocumented  ___No resources for meds or medical care  ___Transportation issues  ___Language/Cultural/Spiritual  _X__Cognitive level / capacity  ___Psychiatric or substance abuse issues  _X__Co-morbidities  ___Potential abuse or neglect  ___Safety issues in the home  ___Potential placement issues  ___Pt / family disagreement with d/c plan  ___Lack of family support  ___Lack of extended family / friend support  ___Home Estate agent (multi-level home/access          issues)   ___ NONE     Inpatient Medicare/Medicare HMO Patients Only  Was an initial IMM signed within 24 hours of admission?  (Look in Media Tab, Documents Table or Shadow Chart)  ]       Uninsured Patients Only  If patient has a spouse, does your spouse have insurance under his/her place of employment?    Did the patient sign up for insurance through the Affordable Care Act?        Wendi Maya, MSW  Ext. [phone]

## 2014-01-09 NOTE — Plan of Care (Signed)
Problem: Physical Therapy  Goal: Patient condition is improving per Physical Therapy Treatment Plan  Outcome: Progressing  Discharge Recommendation: Home with home health PT (if pt is able to have assistance at home.)  DME Recommended for Discharge: Front wheel walker    Treatment/Interventions: Exercise;Gait training;Functional transfer training;LE strengthening/ROM;Endurance training;Patient/family training;Equipment eval/education;Bed mobility  PT Frequency: 4-5x/wk     Early/Progressive Mobility Protocol Level: Step 8: Ambulate in Hallway (2x a day with assistance)    Goals:   Goals  Goal Formulation: With patient  Time for Goal Acheivement: 7 visits  Goals: Select goal  Pt Will Ambulate: > 200 feet;with rolling walker;Independently  Pt Will Go Up / Down Stairs: 6-10 stairs;Independently (Pt has two flights of stairs at home.)  Pt Will Perform Home Exer Program: Independently

## 2014-01-09 NOTE — Progress Notes (Signed)
Progress Note    Date Time: 01/09/2014 1:35 PM  Patient Name: Wendy Ortiz  Attending Physician: Romelle Starcher, *      Assessment & Plan:   23 yo female with hx migraine headaches now s/p head trauma with associated headaches, diplopia, poor balance.  Pt is now c/o photophobia as well.  MRI brain and cervical spine are normal.  Pt with no further diplopia but is c/o gait disorder.  Walking with PT has very slow, methodical gait (using walker)    -  [place] Depakote as this can worsen gait  -  OK to [place] [facility] with 24 hour supervision versus rehab placement  -  Prednisone taper (60mg  x 3 days, 40mg  x 3 days, 20mg  x 3 days)    Subjective:   Patient Seen and Examined. The notes from the last 24 hours were reviewed.   Pt denies diplopia - c/o poor balance.    Review of Systems:   No headache, eye, ear nose, throat problems; no coughing or wheezing or shortness of breath, No chest pain or orthopnea, no abdominal pain, nausea or vomiting, No pain in the body or extremities, no psychiatric, neurological, endocrine, hematological or cardiac complaints except as noted above.     Physical Exam:   Blood pressure 123/67, pulse 53, temperature 97.3 F (36.3 C), temperature source Oral, resp. rate 18, height 1.651 m (5\' 5" ), weight 66.679 kg (147 lb), last menstrual period 01/02/2014, SpO2 99.00%.    HEENT: Normocephalic. No icter or congestion  Neck: supple, no lymphadenopathy, no thyromegaly, no JVD  Extremities: no clubbing, cyanosis, or edema  Skin: no rashes or lesions noted    Neuro:  Level of consciousness:    Alert, moving all 4 ext  EOMI  Walking with PT and walker - very slow, methodical gait      Meds:      Scheduled Meds: PRN Meds:           magnesium sulfate 1 g Intravenous Q12H [facility]   [DISCONTINUED] valproate sodium 500 mg Intravenous Q8H       Continuous Infusions:         butalbital-acetaminophen-caffeine 1 tablet Q6H PRN           I personally reviewed all of the medications    Labs:       Lab 01/07/14  0151   GLU 85   BUN 13.0   CREAT 0.9   CA 8.9   NA 140   K 3.9   CL 108*   CO2 25   ALB 3.7   PHOS --   MG --   AST 17   ALT 14   BILITOTAL 0.2   ALKPHOS 50       Lab 01/07/14 0151   WBC 7.92   HGB 12.3   HCT 35.2*   MCV 89.3   MCH 31.2   MCHC 34.9   PLT 205         No results found for this basename: PTT:2,PT:2,INR:2 in the last 72 hours       Radiology Results (24 Hour)     Procedure Component Value Units Date/Time    MRI Cervical Spine WO Contrast [[id number]] Collected:01/08/14 0840    Order Status:Completed  Updated:01/08/14 1614    Narrative:    MRI CERVICAL SPINE:     INDICATION: head trauma and neck pain    TECHNIQUE: Multisequence, multiplanar MRI of the cervical spine was  performed without intravenous contrast administration.  Obtained  sequences include sagittal T1, sagittal T2, sagittal STIR, and axial T2  weighted.      COMPARISON: None.     CORRELATION: CT cervical spine dated 01/07/2014.    FINDINGS: The study is limited by artifacts related patient motion.  There is diffusely intermediate T1 marrow signal, nonspecific, but most  commonly reflects prominent red marrow which can be seen in a variety of  clinical states such as obesity and anemia.  Straightening of the cervical spine is noted and may be positional or  due to spasm. No significant disc height loss is demonstrated. No cord  signal abnormality is present. No significant neuroforaminal narrowing  or central canal stenosis demonstrated. No significant disc protrusion  or disc bulges are present. The vertebral body heights are maintained.  Visualized portions of the posterior fossa are unremarkable. Retention  cyst or polyp is noted within the right maxillary sinus.      Impression:     No acute findings. No disc protrusions, neuroforaminal  narrowing, or central canal stenosis. Reversal of the cervical lordosis  which may be positional or due to spasm.    Gustavus Messing, MD   01/08/2014 4:10 PM             All recent brain and spine imaging  (MRI, CT) personally reviewed.    Case discussed with: Dr. Arletha Pili, patient, family    20 minutes; >50% time spent in counseling or coordination of care    Signed by: Ardelle Anton, MD  Spectralink: [phone]       Answering Service: [phone]

## 2014-01-09 NOTE — Plan of Care (Signed)
Patient alert and oriented X 4, vitals stable, stated feeling less pain due to earlier headache. No PRN medications given. Orthostatic BP -, pt reported feeling dizzy with change of position from sitting to standing and with ambulation. Gait ataxic, contact guard provided for safety. Pt verbalized not feeling stressed, currently having 3 jobs and caring for retired mother. Stated seeing therapist once a week due to feeling of anxiety. Light sensitivity improving. Bed alarm on for continued safety. Will continue to monitor

## 2014-01-09 NOTE — Discharge Instructions (Signed)
Double Vision  Double Vision means that you are seeing one object as two. There are many causes for this condition including:   Disease inside the eye   Problem with the muscles and nerves that control the movement of the eye   Injury to the eye or bones around the eye   Disease in the brain   Other causes  Further testing is needed to determine the cause of your double vision. Because a serious disease may be present, it is important that you follow up as directed.  Home Care:  1) Double vision will affect your ability to judge distance. This means it will be harder to drive. Do not drive until the problem is corrected.  2) If you were given a removable eye patch, wear it while awake. You may remove it at night to sleep.  Follow Up  with your doctor or as advised by our staff.  Get Prompt Medical Attention  if any of the following occur:  -- Redness, rash or swelling around the eye  -- Sinus or facial pain  -- Severe headache, drowsiness or confusion  -- Vertigo (spinning sensation)  -- Weakness in the muscles of the face, arms or legs; or, difficulty with vision, speech or walking   [address], [address], [place], [place] [address]. All rights reserved. This information is not intended as a substitute for professional medical care. Always follow your healthcare professional's instructions.      Headache [Unspecified]    The cause of your headache today is not clear, but it does not appear to be the sign of any serious illness.  Under stress, some people tense the muscles of their shoulder, neck and scalp without knowing it. If this condition lasts long enough, a TENSION HEADACHE can occur.  A MIGRAINE HEADACHE is caused by changes in blood flow to the brain. A migraine attack may be triggered by emotional stress, hormone changes during the menstrual cycle, oral contraceptives, alcohol use, certain foods containing tyramine, eye strain, weather changes, missing meals, lack of sleep or  oversleeping.  Other causes of headache include a viral illness with high fever, head injury with concussion, sinus, ear or throat infection, dental pain and TMJ (jaw joint) pain. More serious but less common causes of headache include stroke, brain hemorrhage, brain tumor, meningitis and encephalitis.  Home Care:   If you were given pain medicine for this headache, do not drive yourself home. Arrange for a ride, instead. When you get home, try to sleep. You should feel much better when you wake up.   Apply heat to the back of your neck to relieve neck muscle spasm. Migraine headaches may respond best to an ice pack on the forehead or at the base of the skull.   If you are having nausea or vomiting, follow a light diet until your headache is relieved.   If you have a migraine type headache, use sunglasses when in the daylight or around bright indoor lighting until symptoms improve. Bright glaring light can worsen this kind of headache.  Follow Up  with your doctor if the headache is not better within the next 24 hours. If you have frequent headaches you should discuss a treatment plan with your primary care doctor. By being aware of the earliest signs of headache, and starting treatment right away, you may be able to stop the pain yourself.  Get Prompt Medical Attention  if any of the following occur:   Worsening of  your head pain or no improvement within 24 hours   Repeated vomiting (unable to keep liquids down)   Fever of 100.110F (38C) or higher, or as directed by your healthcare provider   Stiff neck   Extreme drowsiness, confusion or fainting   Dizziness, vertigo (dizziness with spinning sensation)   Weakness of an arm or leg or one side of the face   Difficulty with speech or vision   [address] [place], [place] [address]. All rights reserved. This information is not intended as a substitute for professional medical care. Always follow your healthcare professional's  instructions.      Self-Care for Headaches  Most headaches aren't serious and can be relieved with self-care. But some headaches may be a sign of another health problem like eye trouble or high blood pressure. To find the best treatment, learn what kind of headaches you get. For tension headaches, self-care will usually help. To treat migraines, ask your doctor for advice. It is also possible to get both tension and migraine headaches. Self-care involves relieving the pain and avoiding headache "triggers" if you can.    Ways to Reduce Pain and Tension   Apply a cold compress or ice pack to the pain site.   Drink fluids. If nausea makes it hard to drink, try sucking on ice.   Rest. Protect yourself from bright light and loud noises.   Calm your emotions by imagining a peaceful scene.   Massage tight neck, shoulder, and head muscles.   To relax muscles, soak in a hot bath or use a hot shower.  Use Medications  Aspirin or aspirin substitutes, such as ibuprofen and acetaminophen, can relieve headache. Remember: Never give aspirin to anyone 59 or younger.  Track Your Headaches  Keeping a headache diary can help you and your doctor identify what's causing your headaches.   Note when each headache occurs.   Identify your activities and the foods you've eaten 6-8 hours before the headache began.   Look for any trends or "triggers."  Signs of Tension Headache   Dull pain or feeling of pressure in a tight band around your head   Pain in your neck or shoulders   Headache without a definite beginning or end   Headache after an activity such as driving or working on a computer  Signs of Migraine   Throbbing pain on one or both sides of your head   Nausea or vomiting   Extreme sensitivity to light, sound, and smells   Bright spots, flashes, or other visual changes   Pain or nausea so severe that you can't continue your daily activities  Call Your Doctor If You Have:   A headache that lingers after a recent  injury or bump to the head.   A fever with a stiff neck or pain when you bend your head toward your chest.   A headache along with slurred speech, changes in your vision, or numbness or weakness in your arms or legs.   A headache for longer than 3 days.   Headaches often, especially in the morning.    [address], [address], [place], [place] [address]. All rights reserved. This information is not intended as a substitute for professional medical care. Always follow your healthcare professional's instructions.      Understanding Headache Pain   Headache pain can start in different structures in the head. The brain itself doesn't hurt, but other parts of the head  do. Headache is a common symptom of illness, such as a cold or the flu. At other times, headaches occur without seeming to be connected to any illness. Very rarely are headaches a sign of a serious medical problem.     What Is Referred Pain?   Referred pain has its source in one place but is felt in another. For example, pain behind the eyes may actually be caused by tense muscles in the neck and shoulders. This means that the place that hurts may not be the part of the head that needs treatment.    [address], [place], [place] [address]. All rights reserved. This information is not intended as a substitute for professional medical care. Always follow your healthcare professional's instructions.      Migraine Headache: Stages and Treatment  A migraine headache tends to progress in stages. Learning these stages can help you better understand what is happening. Then you can learn ways to reduce pain and relieve other symptoms. Methods for relieving your symptoms include self-care and medications.  Migraine Stages  Migraines tend to progress through 4 stages. Many people don't have all stages, and stages may differ with each headache.   Prodrome. A few hours to a day or so before the headache, you may feel  tired, uneasy, or moody. You may also feel bloated or crave certain foods.   Aura. Up to an hour before the headache starts, some migraine sufferers experience aura--flashing lights, blind spots, other vision problems, confusion, or difficulty speaking.   Headache. Mild to severe pain affects one or both sides of the head, often along with nausea. You may be highly sensitive to light, sound, and odors. Vomiting or diarrhea may also occur. This stage lasts 4 to 72 hours.   Postdrome. After your headache ends, you may feel tired, achy, and "washed out." This may last for a day or so.    Self-Care During a Migraine   Use a cold compress. Wrap a thin cloth around a cold pack, a cold can of soda, or a bag of frozen vegetables. Apply this to your temple or other pain site.   Drink fluids. If nausea makes it hard to drink, try sucking on ice.   Rest. If possible, lie down. Try not to bend over, as this may increase your pain.   Try caffeine. Some people find that drinking fluids with caffeine, such as coffee or tea, helps to lessen migraine pain.  Using Medications  Work with your healthcare provider to find the right medications for you. Medications for migraine may relieve pain (analgesics), relieve nausea, or attack the migraine's root causes (migraine-specific medications).  Rebound Headache  Taking analgesics each day, or even several times a week, may lead to more frequent and severe headaches. These are called rebound headaches. If you think you're having rebound headaches, tell your healthcare provider. He or she can help you safely decrease your medication.    [address], [address], [place], [place] [address]. All rights reserved. This information is not intended as a substitute for professional medical care. Always follow your healthcare professional's instructions.

## 2014-01-09 NOTE — Plan of Care (Signed)
At around 2010 hrs, discharged home via wheelchair with mother.

## 2014-01-09 NOTE — Progress Notes (Signed)
Home health referral received and discussed with patient. Per agreement with patient, HHPT orders sent out via [facility].  Will follow up with patient when agency identified.

## 2014-01-09 NOTE — Treatment Plan (Signed)
Severe Sepsis Screen  Date: 01/09/2014 Time: 1:10 PM  Nurse Signature: Alizea Pell    A. Infection:    Does your patient have ONE or more of the following infection criteria?     []  Documented Infection - Does the patient have positive culture results (from blood,        sputum, urine, etc)?   []  Anti-Infective Therapy - Is the patient receiving antibiotic, antifungal, or other                anti-infective therapy?   []  Pneumonia - Is there documentation of pneumonia (X Ray, etc)?   []  WBC's - Have WBC's been found in normally sterile fluid (urine, CSF, etc.)?   []  Perforated Viscus -Does the patient have a perforated hollow organ (bowel)?    A.  Did you check any of the boxes above?    [x]  No  If No, Stop Here and Sepsis Screen Negative.               []  Yes, continue to section B      B. SIRS:     Does your patient have TWO or more of the following SIRS criteria (ensure vital signs & temperature are within 1 hour of this screening)?    []  Temperature - Is the patient's temperature: Temp: 97.3 F (36.3 C) (01/09/14 1500)   - Greater than or equal to 38.3 degrees C (greater than 100.9 degrees F)?   - Less than or equal to 36 degrees C (less than or equal to 96.8 degrees F)?    []  Heart Rate: Heart Rate: 57  (01/09/14 1500)   - Is the patient's heart rate greater than or equal to 90 bpm?    []  Respiratory: Resp Rate: 18  (01/09/14 1500)   - Is the patient's respiratory rate greater than or equal to 20?    []  WBC Count - Is the patient's WBC count:   Lab 01/07/14 0151   WBC 7.92      - Greater than or equal to 12,000/mm3 OR   - Less than or equal to 4,000/mm3 OR    - Are there greater than 10% immature neutrophils (bands)?    []  Glucose >140 without diabetes?   Lab 01/07/14 0151   GLU 85       []  Significant edema?    B.  Did you check two or more of the boxes above?     []  No, Stop Here and Sepsis Screen Negative   []  Yes, continue to section C    C.  Acute Organ Dysfunction     Does your patient have ONE or more  of the following organ dysfunction? (May need to wait for lab results for assessment - see below) Organ dysfunction must be a result of the sepsis not chronic conditions.    []  Cardiovascular - Does the patient have a: BP: 126/71 mmHg (01/09/14 1500)   -systolic Blood pressure less than or equal to 90 mmHg OR   -systolic blood pressure has dropped 40 mmHg or more from baseline OR   -mean arterial pressure less than or equal to 70 mmHg (for at least one hour   despite fluid resuscitation OR require vasopressor support?  []  Respiratory - Does the patient have new hypoxia defined by any of the following"   -A sustained increase in oxygen requirements by at least 2L/min on NC or 28%    FiO2 within the last 24 hrs OR   -  A persistent decrease in oxygen saturation of greater than or equal to 5% lasting   at least four or more hours and occurring within the last 24 hours  []  Renal - Does the patient have:   -low urine output (e.g. Less than 0.48mL/kg/HR for one hour despite adequate fluid    resuscitation, OR   -Increased creatinine (greater than 50% increase from baseline) OR   -require acute dialysis?  []  Hematologic - Does the patient have a:   -Low platelet count (less than 100,000 mm3)   Lab 01/07/14 0151   PLT 205   OR   -INR/aPTT greater than upper limit of normal?No results found for this basename: INR:1 in the last 168 hours or                No results found for this basename: APTT:1 in the last 168 hours  []  Metabolic - Does the patient have a high lactate (plasma lactate greater than or equal to 2.4 mMol/L)? No results found for this basename: LACTATE:5 in the last 168 hours    []  Hepatic - Are the patient's liver enzymes elevated (ALT greater than 72 IU/L or Total      Bilirubin greater than 2 MG/dL)?    Lab 01/07/14 0151   BILITOTAL 0.2   ALT 14     []  CNS - Does the patient have altered consciousness or reduced Glasgow Coma      Scale?    C.  Did you check any of the boxes above?     []  No, Sepsis Screen  Negative   []  YES:  A) Infection + B) SIRS + C) Organ Dysfunction = Positive Screen for Severe Sepsis     Notify MD and document in Complex Assessment under provider notification   - Name of physician notified:                                           - Date/Time Notifiied:                                             Document actions: Following must be completed within 1 hour of positive sepsis screen   []  Lactate drawn (if initial lactate > , repeat lactate in 2 hours for goal decrease 10-20%)   []  Blood Cultures obtained (prior to antibiotic administration; if not done within the last 48 hours)   []  Antibiotics initiated or modified    []   IV Fluid administered 0.9% NS __________ mLs given (Initial Bolus of 30 ml/kg if SBP < 90 or MAP < 65 or lactate greater than 4 mmol/dl)  Nursing Comments?:     _________________________________________________________________    Patients meeting the following criteria are excluded from screening (check if applicable):   []  [place] hypothermia protocol   []  Comfort Care orders   []  Surgery- No screening for 24 hours after surgery (48 hours after CV surgery)       -If Yes. Date of surgery:______________________   []  Admitted with sepsis and until 72 hours after admission with documented sepsis (RESUME SEPSIS SCREEN AFTER 72 hour window!!!)      -If Yes, Date of Documented Sepsis:______________________   []  Positive screen AND Completed sepsis bundle during previous 24 hours       -  If Yes, Date/Time of positive severe sepsis screen:_______________________

## 2014-01-09 NOTE — OT Eval Note (Signed)
[facility]   Occupational Therapy Evaluation and Treatment    Patient: Wendy Ortiz     MRN#: [id number]   Unit: [facility] INTERMEDIATE CARE  Bed: A2820/A2820-01    Time of treatment: Time Calculation  OT Received On: 01/09/14  Start Time: 8416  Stop Time: 1014  Time Calculation (min): 32 min    Eval: 11 minutes  Treat 21 minutes (TA 10, SC 11)    Consult received for Rafael Bihari for OT Evaluation and Treatment.  Patient's medical condition is appropriate for Occupational therapy intervention at this time.    Activity Orders: BR orders EXP 3/12, RN okay'ed eval  Precautions and Contraindications:   Precautions  Weight Bearing Status: no restrictions  Other Precautions: Falls    Medical Diagnosis: Diplopia [368.2]  HA (headache) [784.0]  Diplopia    History of Present Illness: Wendy Ortiz is a 23 y.o. female admitted on 01/06/2014 with complaint of double vision and headache; light sensitivity. Patient with recent head injury. Reports multiple boxes of shoes fell off shelf onto head.     Patient Active Problem List   Diagnosis   . Diplopia        Past Medical/Surgical History:  History reviewed. No pertinent past medical history.   History reviewed. No pertinent past surgical history.      X-Rays/Tests  Lab Results   Component Value Date/Time    HGB 12.3 01/07/2014  1:51 AM    HCT 35.2* 01/07/2014  1:51 AM    K 3.9 01/07/2014  1:51 AM    NA 140 01/07/2014  1:51 AM     CT Head (-)  CT C-Spine: Negative for acute  MRI Brain (-)  CTA Head (-)  CT Angio neck (-)  MRI C spine: reversal of cervical lordosis    Social History:  Prior Level of Function  Prior level of function: Ambulates independently;Independent with ADLs  Baseline Activity Level: Community ambulation  Driving: independent  Dressing - Upper Body: independent  Dressing - Lower Body: independent  Cooking: Yes  Feeding: independent  Bathing: independent  Grooming: independent  Toileting: independent  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 Nursing clears patient for therapy.   Patient Goal: Get back to work  Pain Assessment  Pain Assessment: No/denies pain      Objective:      Observation of Patient/Vital Signs:VSS     Patient received in bed with IV, Sequential Compression Device (SCD) and bed alarm in place.    Cognitive Status and Neuro Exam:  Cognition  Arousal/Alertness: Appropriate responses to stimuli  Attention Span: Appears intact  Orientation Level: Oriented X4  Memory: Appears intact  Following Commands: independent  Safety Awareness: minimal verbal instruction  Insights: Fully aware of deficits  Neuro Status  Behavior: cooperative;flat affect  Motor Planning: intact  Coordination: GMC impaired       Musculoskeletal Examination  Gross ROM  Right Upper Extremity ROM: within functional limits  Left Upper Extremity ROM: within functional  limits  Gross Strength  Right Upper Extremity Strength: within functional limits  Left Upper Extremity Strength: within functional limits       Sensory/Oculomotor Examination  Sensory  Auditory: intact  Tactile - Light Touch: intact  Visual Acuity: intact         Activities of Daily Living  Self-care and Home Management  Eating: independently  Grooming:  (CGA at sink)  UE Dressing: stand by assistance  LE Dressing: standby assistance  Toileting: supervison/safety  Functional Transfers:  (CGA)    Functional Mobility:  Mobility and Transfers  Supine to Sit: Stand by assistance  Sit to Supine: Stand by assistance  Sit to Stand:   (CGA)  Bed to Chair:  (CGA)     Balance  Balance  Static Sitting Balance: good  Dyanamic Sitting Balance: fair    Participation and Activity Tolerance  Participation and Endurance  Participation Effort: fair  Endurance: Tolerates < 10 min exercise, no significant change in vital signs    G codes: not indicated                 Educated the patient to role of occupational therapy, plan of care, goals of therapy and discharge planning. Patient verbalized understanding.      Patient left in bed with SCDs and bed alarm in place and call bell within reach. RN notified of session outcome    Assessment: Wendy Ortiz is a 23 y.o. female admitted on 01/06/2014. Pt presents with Assessment: balance deficits;decreased independence with ADLs  Pt would benefit from skilled Occupational Therapy to maximize safety and return to prior level of independence with ADLs.    Treatment: Patient requesting to ambulate to rest room to "get cleaned up." Patient required CGA for mobility 2/2 to mutiple LOBs. Patient attempts to self correct by holding onto walls. Patient stood at sink to brush teeth and wash face, leaned trunk against sink, but still had occasional buckle. Patient returned to bed, however therapist noted with incidental cervical extension patients symptoms increased, patient closed her eyes. Attempted to assess for nystagmus however was unable to do so. Patient describes symptoms as a sudden "rush", possibly vertigo. Because of patients frequent LOBs recommending 24' supervision and PT evaluation. Discussed with RN and CM.     Rehabilitation Potential: Prognosis: Ongoing OT assessment needed;24 hour supervision recommended    Plan: OT Frequency Recommended: 2-3x/wk   Treatment Interventions: ADL retraining;Functional transfer training;Patient/Family training;Compensatory technique education     Risks/benefits/POC discussed with patient    Goals: Time For Goal Achievement: 2 visits  ADL Goals  Patient will groom self:  modified independently;at sinkside  Patient will dress lower body: modified independently  Patient will toilet: modified independently  Mobility and Transfer Goals  Pt will perform functional transfers: modified independently     DME Recommended for Discharge:  (defer to PT at this time)  Discharge Recommendation: Home with supervision (24 hour supervision, PT evaluation 2/2 to frequent LOBs)    Midge Aver OTR/L #[id number]  01/09/2014 10:54 AM

## 2014-01-09 NOTE — Progress Notes (Signed)
CM set up pt w/f/u appointment w/a new PCP since pt does not have a PCP:      Date: 01/09/14    Dear Wendy Ortiz Patient,    In order to assist with your transition from the hospital to home, we have made an appointment for you at the [facility] for further follow-up.        Appointment date & time:  Wednesday January 14, 2014 at 12:30pm    Discharge Clinic address & phone:  [address]  [address] [place]  [place]  [address]  [phone]    If you are unable to make your scheduled appointment, please call the number above to reschedule.  Otherwise, please arrive 20 minutes early and brings all medications.      Thank you for choosing [facility] for your health care needs.          __ _________  Case Manager    Unit [address]_  [facility]  [phone]

## 2014-01-09 NOTE — Progress Notes (Signed)
CM contacted pt's mother, Agustin Cree who confirmed that she will provide 24 hours supervision for the pt once she returns home, at least for the weekend.     Pt will speak w/pt's mother to coordinate transportation.     Wendi Maya, MSW  Ext. [phone]

## 2014-01-09 NOTE — Plan of Care (Signed)
Problem: Occupational Therapy  Goal: Patient condition is improving per Occupational Therapy Treatment Plan  Outcome: Progressing  Discharge Recommendation: Home with supervision (24 hour supervision, PT evaluation 2/2 to frequent LOBs)   DME Recommended for Discharge:  (defer to PT at this time)    OT Frequency Recommended: 2-3x/wk     Early/Progressive Mobility Protocol Level: Step 6: Ambulate in Room with supervision    Treatment Interventions: ADL retraining;Functional transfer training;Patient/Family training;Compensatory technique education       Goal Formulation: Patient  Time For Goal Achievement: 2 visits  ADL Goals  Patient will groom self: modified independently;at sinkside  Patient will dress lower body: modified independently  Patient will toilet: modified independently  Mobility and Transfer Goals  Pt will perform functional transfers: modified independently

## 2014-01-09 NOTE — PT Eval Note (Signed)
[facility]  Physical Therapy Evaluation and Treatment    Patient: Wendy Ortiz     MRN#: [id number]   Unit: [place] [facility] INTERMEDIATE CARE  Bed: A2820/A2820-01    Time of Evaluation:  Time Calculation  PT Received On: 01/09/2014  Start Time: 1310  Stop Time: 1320  Time Calculation (min): 10    Time of Treatment:  Time Calculation  PT Received On: 01/09/2014  Start Time: 1320  Stop Time: 1340  Time Calculation (min): 20    Consult received for Rafael Bihari for PT Evaluation and Treatment.  Patient's medical condition is appropriate for Physical therapy intervention at this time.    Activity Orders: None specified.    Precautions and Contraindications:   Precautions  Weight Bearing Status: no restrictions  Other Precautions: Falls    Medical Diagnosis: Diplopia [368.2]  HA (headache) [784.0]  Diplopia    History of Present Illness: Wendy Ortiz is a 23 y.o. female admitted on 01/06/2014 with s/p head trauma with associated headaches, diplopia, poor balance.       Patient Active Problem List   Diagnosis   . Diplopia        Past Medical/Surgical History:  History reviewed. No pertinent past medical history.   History reviewed. No pertinent past surgical history.      X-Rays/Tests/Labs:  Lab Results   Component Value Date/Time    HGB 12.3 01/07/2014  1:51 AM    HCT 35.2* 01/07/2014  1:51 AM    K 3.9 01/07/2014  1:51 AM    NA 140 01/07/2014  1:51 AM     MRI Cervical Spine 01/07/2014: No acute findings. No disc protrusions, neuroforaminal narrowing, or central canal stenosis. Reversal of the cervical lordosis which may be positional or due to spasm.    CT Angiogram 01/07/2014:   Unremarkable CTA of the head. No dissection is demonstrated.    CT Head 01/07/2014: No vascular stenosis is demonstrated. No dissection is identified.       Social History:  Prior Level of Function  Prior level of function: Ambulates independently;Independent with ADLs  Baseline Activity Level: Community ambulation  Driving:  independent  Cooking: Yes  Employment: FT    Home Living Arrangements  Living Arrangements: Parent (Lives with mom)  Type of Home: House  Home Layout: Multi-level;Stairs to enter with rails (add number in comment) (Would be able to live on 1 floor except kitchen.)  Bathroom Shower/Tub: Tub/shower unit    Subjective:  Pt states "Doing ok right now."  During ambulation c/o "flash feeling."   Patient is agreeable to participation in the therapy session. Nursing clears patient for therapy.      Pain Assessment  Pain Assessment: No/denies pain    Objective:  Observation of Patient/Vital Signs:  Filed Vitals:    01/09/14 1100   BP: 123/67   Pulse: 53   Temp: 97.3 F (36.3 C)   Resp: 18         Patient received in bed with Telemetry, SCD's and Intravenous (IV) in place.    Inspection/Posture: Supine in reclined bed    Cognitive Status and Neuro Exam:  Cognition  Arousal/Alertness: Appropriate responses to stimuli  Attention Span: Appears intact  Orientation Level: Oriented X4  Memory: Appears intact  Following Commands: Follows all commands and directions without difficulty  Safety Awareness: minimal verbal instruction  Insights: Decreased awareness of deficits  Problem Solving: Assistance required to identify errors made;Assistance required to generate solutions  Neuro Status  Behavior: attentive;calm;cooperative  Motor Planning: decreased processing speed;decreased initiation  Coordination: intact  Safety Awareness: intact    Musculoskeletal Examination  Gross ROM  Neck/Trunk ROM: within functional limits  Right Lower Extremity ROM: within functional limits  Left Lower Extremity ROM: within functional limits  Gross Strength  Neck/Trunk Strength: 5/5  Right Lower Extremity Strength: within functional limits  Left Lower Extremity Strength: within functional limits       Functional Mobility:  Functional Mobility  Supine to Sit: Independent  Scooting to EOB: Independent  Sit to Stand: Stand by assistance (Cues to not grab  walker, but push from bed.)  Stand to Sit: Stand by assistance (Cues to reach for bed not RW, keep COG on BOS to lower safe)     Locomotion  Ambulation: with front-wheeled walker (CGA to Min assist. cues to swing foot with toes up)  Ambulation Distance (Feet): 20 Feet  Pattern: scissoring;Step through;L foot drop;R foot drop     Balance  Balance  Balance: needs focused assessment  Sitting - Static: Good  Sitting - Dynamic: Good  Standing - Static: Good  Standing - Dynamic: Poor    Participation and Activity Tolerance  Participation and Endurance  Participation Effort: excellent  Endurance: Tolerates 10 - 20 min exercise with multiple rests    Educated the patient to role of physical therapy, plan of care, goals of therapy and HEP, safety with mobility and ADLs, home safety.   Patient left in bed with SCDs in place and call bell within reach. RN notified of session outcome.     Assessment: Wendy Ortiz is a 23 y.o. female admitted 01/06/2014 .    Pt presents with the following impairments: Assessment: Decreased LE strength;Decreased safety/judgement during functional mobility;Decreased endurance/activity tolerance;Impaired coordination;Impaired motor control;Decreased functional mobility;Decreased balance;Gait impairment.  Pt gait with scissoring, and apparent foot drop that is correctable with cues.  Pt needs frequent reminders of gait cues.   Pt would benefit from Physical Therapy to increase strength, stability and coordination with ambulation and functional activities safely.    Treatment: Sit to stand with SBA and cues to push from bed, not grab RW.  Ambulation with cues to shorten stride, focus on dorsiflexion and toe extension with swing of gait for BLE.  Cues to stand b/w back legs of RW to erect posture.  When pt c/o "flash" cued to stop and rest.  Pt able to return to room without return of "flash".  Stand to sit with cues to reach back for bed and release RW for safety.      Plan:  Treatment/Interventions: Exercise;Gait training;Functional transfer training;LE strengthening/ROM;Endurance training;Patient/family training;Equipment eval/education;Bed mobility   PT Frequency: 4-5x/wk   Risks/Benefits/POC Discussed with Pt/Family: With patient     G codes: not indicated             Goals:   Goals  Goal Formulation: With patient  Time for Goal Acheivement: 7 visits  Goals: Select goal  Pt Will Ambulate: > 200 feet;with rolling walker;Independently  Pt Will Go Up / Down Stairs: 6-10 stairs;Independently (Pt has two flights of stairs at home.)  Pt Will Perform Home Exer Program: Independently        DME Recommended for Discharge: Front wheel walker  Discharge Recommendation: Home with home health PT (if pt is able to have assistance at home.)    Marland Kitchen, SPT  Student Physical Therapist    Inda Castle, PT, DPT  Spectralink [phone]  3:10 PM 01/09/2014

## 2014-01-09 NOTE — Discharge Summary (Signed)
DISCHARGE SUMMARY    Date Time: 01/09/2014 11:16 AM  Patient Name: Wendy Ortiz  Attending Physician: Romelle Starcher, *    Date of Admission - Discharge:   01/06/2014 - 01/09/2014     Discharge Dx:   1. Migraine headaches  2. Diplopia/Photophobia  3. Head Injury  4. Neck pain  5. Nausea and vomiting    Consultations:   Neurology Dr. Francesco Sor    Admission H&P:   Please see the initial H& P notes for this admission.  Hospital Course:   Wendy Ortiz is a 23 y.o. female  admitted  with headache and diplopia. Was consulted to Neurology. Had extensive workup done including MRI/MRA of brain, neck. Was given pain medications and depacon in the hospital for suspected migraine. Improved symptomatically. Discharged home to follow up with neurology as outpatient.     Patient was discharged home in stable vital condition with recommendation to follow up with PMD and above consultants as recommended in the medication reconciliation.     Discharge Medications:     Current Discharge Medication List      CONTINUE these medications which have NOT CHANGED    Details   HydrOXYzine Pamoate (VISTARIL PO) Take by mouth.      ibuprofen (ADVIL,MOTRIN) 600 MG tablet Take 1 tablet (600 mg total) by mouth every 6 (six) hours as needed for Pain or Fever.  Qty: 30 tablet, Refills: 0               Discharge Instructions:   Follow-up with Consultants and Primary care MD , including outpatient lab work and or specific tests as recommended in the specific follow up instructions.      Signed by: Romelle Starcher, MD  CC: PCP, Consultants for this admission.

## 2014-01-12 NOTE — Progress Notes (Signed)
No home health agency will accept pt without a PCP.  [facility] noted that pt has appt with a new MD on 3/18.

## 2014-09-19 ENCOUNTER — Emergency Department: Payer: No Typology Code available for payment source

## 2014-09-19 ENCOUNTER — Emergency Department
Admission: EM | Admit: 2014-09-19 | Discharge: 2014-09-19 | Disposition: A | Discharge status: Home / Self Care | Payer: No Typology Code available for payment source | Attending: Emergency Medicine | Admitting: Emergency Medicine

## 2014-09-19 DIAGNOSIS — R519 Headache, unspecified: Secondary | ICD-10-CM

## 2014-09-19 DIAGNOSIS — R51 Headache: Secondary | ICD-10-CM | POA: Insufficient documentation

## 2014-09-19 HISTORY — DX: Unspecified asthma, uncomplicated: J45.909

## 2014-09-19 MED ORDER — KETOROLAC TROMETHAMINE 30 MG/ML IJ SOLN
30.0000 mg | Freq: Once | INTRAMUSCULAR | Status: AC
Start: 2014-09-19 — End: 2014-09-19
  Administered 2014-09-19: 30 mg via INTRAVENOUS
  Filled 2014-09-19: qty 1

## 2014-09-19 MED ORDER — METOCLOPRAMIDE HCL 5 MG/ML IJ SOLN
10.0000 mg | Freq: Once | INTRAMUSCULAR | Status: AC
Start: 2014-09-19 — End: 2014-09-19
  Administered 2014-09-19: 21:00:00 10 mg via INTRAVENOUS
  Filled 2014-09-19: qty 2

## 2014-09-19 MED ORDER — MAGNESIUM SULFATE IN D5W 10-5 MG/ML-% IV SOLN
1.0000 g | Freq: Once | INTRAVENOUS | Status: AC
Start: 2014-09-19 — End: 2014-09-19
  Administered 2014-09-19: 1 g via INTRAVENOUS
  Filled 2014-09-19: qty 100

## 2014-09-19 MED ORDER — METOCLOPRAMIDE HCL 10 MG PO TABS
10.0000 mg | ORAL_TABLET | Freq: Three times a day (TID) | ORAL | Status: DC | PRN
Start: 2014-09-19 — End: 2014-12-18

## 2014-09-19 MED ORDER — SODIUM CHLORIDE 0.9 % IV BOLUS
1000.0000 mL | Freq: Once | INTRAVENOUS | Status: AC
Start: 2014-09-19 — End: 2014-09-19
  Administered 2014-09-19: 1000 mL via INTRAVENOUS

## 2014-09-19 MED ORDER — IBUPROFEN 600 MG PO TABS
600.0000 mg | ORAL_TABLET | Freq: Four times a day (QID) | ORAL | Status: DC | PRN
Start: 2014-09-19 — End: 2014-12-18

## 2014-09-19 NOTE — ED Provider Notes (Signed)
Physician/Midlevel provider first contact with patient: 09/19/14 2100           EMERGENCY DEPARTMENT NOTE    Physician/Midlevel provider first contact with patient: 09/19/14 2100         HISTORY OF PRESENT ILLNESS   Historian: Patient  Translator Used: None    23 y.o. female reports frontal HA beginning today with associated photophobia, phonophobia and nausea. Denies vomiting, focal weakness, speech changes, f/c, neck pain/stiffness. Pt reports prior headaches which were different due to light sensitivity and this was more severe. No known history or FH of migraine    1. Location of symptoms: Head  2. Onset of symptoms: today  3. What was patient doing when symptoms started (Context): At work  4. Severity: Moderate  5. Timing: Constant  6. Activities that worsen symptoms: Light, Sound  7. Activities that improve symptoms: None. Pt tried motrin without improvement  8. Quality: Ache   9. Radiation of symptoms: None  10. Associated signs and Symptoms: As above   11. Are symptoms worsening? Yes          MEDICAL HISTORY     Past Medical History:  Past Medical History   Diagnosis Date   . Asthma        Past Surgical History:  History reviewed. No pertinent past surgical history.    Social History:  History     Social History   . Marital Status: Single     Spouse Name: N/A     Number of Children: N/A   . Years of Education: N/A     Occupational History   . Not on file.     Social History Main Topics   . Smoking status: Light Tobacco Smoker   . Smokeless tobacco: Not on file   . Alcohol Use: Yes      Comment: socially    . Drug Use: No   . Sexual Activity: Not on file     Other Topics Concern   . Not on file     Social History Narrative       Family History:  No family history on file.    Outpatient Medication:  Discharge Medication List as of 09/19/2014 11:35 PM      CONTINUE these medications which have NOT CHANGED    Details   HydrOXYzine Pamoate (VISTARIL PO) Take by mouth., Until Discontinued, Historical Med              Allergies:  Allergies   Allergen Reactions   . Penicillins Anaphylaxis               REVIEW OF SYSTEMS   Review of Systems   Constitutional: Negative for fever and chills.   Eyes: Positive for photophobia.   Gastrointestinal: Positive for nausea. Negative for vomiting and abdominal pain.   Neurological: Positive for headaches. Negative for dizziness, sensory change, speech change and focal weakness.   All other systems reviewed and are negative.        PHYSICAL EXAM     Filed Vitals:    09/19/14 2026   BP: 131/73   Pulse: 76   Temp: 98.6 F (37 C)   Resp: 17   SpO2: 99%         Nursing note and vitals reviewed.  Constitutional:  Well developed, well nourished. Awake & Oriented x3. Painful distress  Head:  Atraumatic. Normocephalic.    Eyes:  PERRL. EOMI. Conjunctivae are not pale.  ENT:  Mucous membranes are  moist and intact. Oropharynx is clear and symmetric.  Patent airway.  Neck:  Supple. Full ROM.  No nuchal rigidity   Cardiovascular:  Regular rate. Regular rhythm. No murmurs, rubs, or gallops.  Pulmonary/Chest:  No evidence of respiratory distress. Clear to auscultation bilaterally.  No wheezing, rales or rhonchi.   Abdominal:  Soft and non-distended. There is no tenderness. No rebound, guarding, or rigidity.  Extremities:  No edema. No cyanosis. No clubbing. Full range of motion in all extremities.  Skin:  Skin is warm and dry.  No diaphoresis. No rash.   Neurological:  Alert, awake, and appropriate. Normal speech. Motor normal. CN 2-12 intact No facial droop   Psychiatric:  Good eye contact. Normal interaction, affect, and behavior.          MEDICAL DECISION MAKING     Patient denies sudden onset, neck pain/stiffness, head injury, anticoagulation use, focal weakness, f/c. Symptoms sound like potential migraine HA. Denies prior history of similar. Pt ordered for IVF, Reglan, Toradol    1020P Pt re-evaluated and appears in no distress, reports HA is improving however still with mild pain. Magnesium 1G  added     On re-evaluation after meds, ivf pt is sleep, appears in no distress. Follow up with a primary physician recommended. Return if sudden onset HA, worsening symptoms, weakness      DISCUSSION      Vital Signs: Reviewed the patient?s vital signs.   Nursing Notes: Reviewed and utilized available nursing notes.  Medical Records Reviewed: Reviewed available past medical records.  Counseling: The emergency provider has spoken with the patient and discussed today?s findings, in addition to providing specific details for the plan of care.  Questions are answered and there is agreement with the plan.    IMAGING STUDIES    The following imaging studies were independently interpreted by the Emergency Medicine Physician.  For full imaging study results please see chart.    CARDIAC STUDIES     The following cardiac studies were independently interpreted by the Emergency Medicine Physician. For full cardiac study results please see chart       PULSE OXIMETRY    Oxygen Saturation by Pulse Oximetry: 99%  Interventions:   Interpretation: Normal    EMERGENCY DEPT. MEDICATIONS      ED Medication Orders     Start     Status Ordering Provider    09/19/14 2224  magnesium sulfate 1g in dextrose 5% IVPB (premix)   Once     Route: Intravenous  Ordered Dose: 1 g     Last Wendy action:  Stopped Wendy Ortiz    09/19/14 2103  sodium chloride 0.9 % bolus 1,000 mL   Once     Route: Intravenous  Ordered Dose: 1,000 mL     Last Wendy action:  Stopped Wendy Ortiz    09/19/14 2103  metoclopramide (REGLAN) injection 10 mg   Once     Route: Intravenous  Ordered Dose: 10 mg     Last Wendy action:  Given Wendy Ortiz    09/19/14 2103  ketorolac (TORADOL) injection 30 mg   Once     Route: Intravenous  Ordered Dose: 30 mg     Last Wendy action:  Given Wendy Ortiz          LABORATORY RESULTS    Ordered and independently interpreted AVAILABLE laboratory tests. Please see results section in chart for full  details.  Results for orders placed or performed  during the hospital encounter of 01/06/14   CBC and differential   Result Value Ref Range    WBC 7.92 3.50 - 10.80 x10 3/uL    RBC 3.94 (L) 4.20 - 5.40 x10 6/uL    Hgb 12.3 12.0 - 16.0 g/dL    Hematocrit 16.1 (L) 37.0 - 47.0 %    MCV 89.3 80.0 - 100.0 fL    MCH 31.2 28.0 - 32.0 pg    MCHC 34.9 32.0 - 36.0 g/dL    RDW 13 12 - 15 %    Platelets 205 140 - 400 x10 3/uL    MPV 11.8 9.4 - 12.3 fL    Neutrophils 38 None %    Lymphocytes Automated 51 None %    Monocytes 8 None %    Eosinophils Automated 2 None %    Basophils Automated 0 None %    Immature Granulocyte Unmeasured None %    Neutrophils Absolute 2.97 1.80 - 8.10 x10 3/uL    Abs Lymph Automated 4.06 0.50 - 4.40 x10 3/uL    Abs Mono Automated 0.66 0.00 - 1.20 x10 3/uL    Abs Eos Automated 0.20 0.00 - 0.70 x10 3/uL    Absolute Baso Automated 0.03 0.00 - 0.20 x10 3/uL    Absolute Immature Granulocyte Unmeasured 0 x10 3/uL   Comprehensive Metabolic Panel (CMP)   Result Value Ref Range    Glucose 85 70 - 100 mg/dL    BUN 09.6 7.0 - 04.5 mg/dL    Creatinine 0.9 0.6 - 1.0 mg/dL    Sodium 409 811 - 914 mEq/L    Potassium 3.9 3.5 - 5.1 mEq/L    Chloride 108 (H) 98 - 107 mEq/L    CO2 25 22 - 29 mEq/L    CALCIUM 8.9 8.5 - 10.5 mg/dL    Protein, Total 6.5 6.0 - 8.3 g/dL    Albumin 3.7 3.5 - 5.0 g/dL    AST (SGOT) 17 5 - 34 U/L    ALT 14 0 - 55 U/L    Alkaline Phosphatase 50 40 - 150 U/L    Bilirubin, Total 0.2 0.2 - 1.2 mg/dL    Globulin 2.8 2.0 - 3.6 g/dL    Albumin/Globulin Ratio 1.3 0.9 - 2.2    Anion Gap 7.0 5.0 - 15.0   UA, Reflex to Microscopic   Result Value Ref Range    Urine Type Clean Catch     Color, UA Yellow Clear - Yellow    Clarity, UA Clear Clear - Hazy    Specific Gravity UA 1.015 1.001-1.035    Urine pH 6.0 5.0-8.0    Leukocyte Esterase, UA Negative Negative    Nitrite, UA Negative Negative    Protein, UR Negative Negative    Glucose, UA Negative Negative    Ketones UA Negative Negative    Urobilinogen, UA  Normal 0.2  -  2.0 mg/dL    Bilirubin, UA Negative Negative    Blood, UA Negative Negative   GFR   Result Value Ref Range    EGFR >60.0    Beta HCG, Qual, Serum   Result Value Ref Range    Hcg Qualitative Negative Negative       CONSULTATIONS        CRITICAL CARE        ATTESTATIONS        Physician Attestation: I, Dr Marnee Spring DO, have been the primary provider for Wendy Ortiz during this Emergency Dept visit and  have reviewed the chart for accuracy and agree with its content.       DIAGNOSIS      Diagnosis:  Final diagnoses:   Nonintractable headache, unspecified chronicity pattern, unspecified headache type       Disposition:  ED Disposition     Discharge Wendy Ortiz discharge to home/self care.    Condition at disposition: Stable            Prescriptions:  Discharge Medication List as of 09/19/2014 11:35 PM      START taking these medications    Details   metoclopramide (REGLAN) 10 MG tablet Take 1 tablet (10 mg total) by mouth 3 (three) times daily as needed (Nausea, vomiting, headache)., Starting 09/19/2014, Until Discontinued, Print         CONTINUE these medications which have CHANGED    Details   ibuprofen (ADVIL,MOTRIN) 600 MG tablet Take 1 tablet (600 mg total) by mouth every 6 (six) hours as needed for Pain or Fever., Starting 09/19/2014, Until Discontinued, Print         CONTINUE these medications which have NOT CHANGED    Details   HydrOXYzine Pamoate (VISTARIL PO) Take by mouth., Until Discontinued, Historical Med               Wendy Ip, DO  09/20/14 [phone]

## 2014-09-19 NOTE — Discharge Instructions (Signed)
Headache    You have been treated for a headache.    Headaches are very common. Most of the time they are benign (not harmful). Some headaches can be very serious. Your headache appears to be benign. The doctor feels it is OK for you to go home.    If you continue to have headaches, or if this headache does not resolve over the next few days, you should be evaluated by your regular doctor or a neurologist. Keep a "headache diary." This may help your doctor learn the cause of your headaches.    Take your headache medication as directed. This is especially important if your doctor has placed you on a daily medication to prevent headaches.    YOU SHOULD SEEK MEDICAL ATTENTION IMMEDIATELY, EITHER HERE OR AT THE NEAREST EMERGENCY DEPARTMENT, IF ANY OF THE FOLLOWING OCCURS:   Your headache gets worse.   You have a severe headache that occurs suddenly.   Your head pain is different from your normal headache.   You have a fever (temperature higher than 100.4F / 38C), especially with a stiff neck.   You feel numbness, tingling, or weakness in your arms or legs.   You pass out.   You have problems with your vision.   You vomit and have trouble taking medication or keeping it down.            [facility]  [address]  [place], [place] [address]  [phone]  Call for 1st available appt    [facility] [place] [facility]  [phone]   [address],  [address]  [place], [place] [address]  (FAX: [phone])    [facility] [place] ([place]  [phone]   [address],  [address]  [place], [place] [address]  (FAX: [phone])    [facility]  [phone]   [address]  [place], [place] [address]  (FAX: [phone])    Clinics: [facility] [place].  FREE CLINICS  (most have residency or income requirements)  [place]  [facility]  (must be a [place] resident for 9 months)  [facility] - [phone]  [place] - [phone]   [place] - [phone]  Women's Health Clinic available at [place] [facility] [place] only  [facility] - [phone]  Clinic in [facility] in [place]  [facility] ([address] [place]) - [phone]  [email or web address]; $45.00 fee  [facility], [place], [place] - [phone]  [facility]  Adult Medicine and Women's Health - [phone]  Pediatrics - [phone]  Family Support & Mental Health Services - [phone]  [facility] [employer] ([address] [phone]  [email or web address]; $20 initial visit; dental hygiene available  [phone]    [place]  [facility] - [phone]  [email or web address]; must be an [place] resident for 1 year    [place]  [facility] - [phone]  [place]  [facility]  Must live in [place]  [place] ([address] - [phone]  open Thursdays 4:30PM-9:00PM (may line up at 4:15PM)  dental clinic available  [place] ([address] - [phone]  open Tuesdays 4:30PM-9:00PM  [facility] - [phone]  Must live in [place]    [facility]    [facility]  [address]    [place], [place] [address]  Phone: [phone]      Adult Medicine & Women's Health, Mental Health & Family Support, Medication Assistance Program  [address]  [place], [place] [address]  Phone: [phone]    [facility] Clinic, Child Health Insurance Enrollment, Mental Health & Family Support  [address]  [place], [place] [address]  Phone: [phone]      Primary Care for Adults & Newborns at the [facility], [facility], [facility], Medication Assistance  Program  [address]  [place], [place] [address]  Phone:[phone]      Primary Care for Adults AT [place], Behavioral Health Division  [place], [address]  [place], [place] [address]  Phone: [phone]      Primary Care for Adults at the [facility]  [address]  [place], [place] [address]  Phone: [phone]    Dental Care at the [facility]  [address]  , [place] [address]  Phone: [phone]    Dental Care at the [place], Division of Public Health  [address]  [place], [place] [address]  Phone: [phone]

## 2014-09-19 NOTE — ED Notes (Signed)
Pt. C/o headache pain with nausea for 1 day. Unrelieved with ibuprofen

## 2014-12-18 ENCOUNTER — Emergency Department: Payer: Medicaid Other

## 2014-12-18 ENCOUNTER — Emergency Department: Payer: No Typology Code available for payment source

## 2014-12-18 ENCOUNTER — Emergency Department
Admission: EM | Admit: 2014-12-18 | Discharge: 2014-12-18 | Disposition: A | Discharge status: Home / Self Care | Payer: Medicaid Other | Attending: Emergency Medicine | Admitting: Emergency Medicine

## 2014-12-18 DIAGNOSIS — R197 Diarrhea, unspecified: Secondary | ICD-10-CM | POA: Insufficient documentation

## 2014-12-18 DIAGNOSIS — J45909 Unspecified asthma, uncomplicated: Secondary | ICD-10-CM | POA: Insufficient documentation

## 2014-12-18 DIAGNOSIS — F17210 Nicotine dependence, cigarettes, uncomplicated: Secondary | ICD-10-CM | POA: Insufficient documentation

## 2014-12-18 DIAGNOSIS — R1110 Vomiting, unspecified: Secondary | ICD-10-CM | POA: Insufficient documentation

## 2014-12-18 LAB — CBC AND DIFFERENTIAL
Basophils Absolute Automated: 0.01 10*3/uL (ref 0.00–0.20)
Basophils Automated: 0 %
Eosinophils Absolute Automated: 0 10*3/uL (ref 0.00–0.70)
Eosinophils Automated: 0 %
Hematocrit: 36 % — ABNORMAL LOW (ref 37.0–47.0)
Hgb: 12.3 g/dL (ref 12.0–16.0)
Lymphocytes Absolute Automated: 0.54 10*3/uL (ref 0.50–4.40)
Lymphocytes Automated: 7 %
MCH: 30.2 pg (ref 28.0–32.0)
MCHC: 34.2 g/dL (ref 32.0–36.0)
MCV: 88.5 fL (ref 80.0–100.0)
MPV: 11.6 fL (ref 9.4–12.3)
Monocytes Absolute Automated: 0.38 10*3/uL (ref 0.00–1.20)
Monocytes: 5 %
Neutrophils Absolute: 6.61 10*3/uL (ref 1.80–8.10)
Neutrophils: 88 %
Platelets: 204 10*3/uL (ref 140–400)
RBC: 4.07 10*6/uL — ABNORMAL LOW (ref 4.20–5.40)
RDW: 13 % (ref 12–15)
WBC: 7.54 10*3/uL (ref 3.50–10.80)

## 2014-12-18 LAB — COMPREHENSIVE METABOLIC PANEL
ALT: 18 U/L (ref 0–55)
AST (SGOT): 27 U/L (ref 5–34)
Albumin/Globulin Ratio: 1.1 (ref 0.9–2.2)
Albumin: 3.6 g/dL (ref 3.5–5.0)
Alkaline Phosphatase: 57 U/L (ref 37–106)
Anion Gap: 10 (ref 5.0–15.0)
BUN: 8 mg/dL (ref 7.0–19.0)
Bilirubin, Total: 0.2 mg/dL (ref 0.2–1.2)
CO2: 23 mEq/L (ref 22–29)
Calcium: 9.1 mg/dL (ref 8.5–10.5)
Chloride: 102 mEq/L (ref 100–111)
Creatinine: 0.8 mg/dL (ref 0.6–1.0)
Globulin: 3.2 g/dL (ref 2.0–3.6)
Glucose: 116 mg/dL — ABNORMAL HIGH (ref 70–100)
Potassium: 3.5 mEq/L (ref 3.5–5.1)
Protein, Total: 6.8 g/dL (ref 6.0–8.3)
Sodium: 135 mEq/L — ABNORMAL LOW (ref 136–145)

## 2014-12-18 LAB — URINALYSIS, REFLEX TO MICROSCOPIC EXAM IF INDICATED
Bilirubin, UA: NEGATIVE
Blood, UA: NEGATIVE
Glucose, UA: NEGATIVE
Ketones UA: NEGATIVE
Leukocyte Esterase, UA: NEGATIVE
Nitrite, UA: NEGATIVE
Protein, UR: NEGATIVE
Specific Gravity UA: 1.005 (ref 1.001–1.035)
Urine pH: 6 (ref 5.0–8.0)
Urobilinogen, UA: NORMAL mg/dL

## 2014-12-18 LAB — I-STAT LACTIC ACID: Lactic Acid I-Stat: 0.7 mmol/L (ref 0.2–2.0)

## 2014-12-18 LAB — PT AND APTT
PT INR: 0.9 (ref 0.9–1.1)
PT: 12.3 s — ABNORMAL LOW (ref 12.6–15.0)
PTT: 28 s (ref 23–37)

## 2014-12-18 LAB — GFR: EGFR: >60

## 2014-12-18 LAB — URINE HCG QUALITATIVE: Urine HCG Qualitative: NEGATIVE

## 2014-12-18 MED ORDER — METRONIDAZOLE 500 MG PO TABS
500.0000 mg | ORAL_TABLET | Freq: Three times a day (TID) | ORAL | Status: AC
Start: 2014-12-18 — End: 2014-12-28

## 2014-12-18 MED ORDER — ONDANSETRON HCL 4 MG/2ML IJ SOLN
4.0000 mg | Freq: Once | INTRAMUSCULAR | Status: AC
Start: 2014-12-18 — End: 2014-12-18
  Administered 2014-12-18: 4 mg via INTRAVENOUS
  Filled 2014-12-18: qty 2

## 2014-12-18 MED ORDER — SODIUM CHLORIDE 0.9 % IV BOLUS
30.0000 mL/kg | Freq: Once | INTRAVENOUS | Status: AC
Start: 2014-12-18 — End: 2014-12-18
  Administered 2014-12-18: 2244 mL via INTRAVENOUS

## 2014-12-18 MED ORDER — ACETAMINOPHEN 500 MG PO TABS
1000.0000 mg | ORAL_TABLET | Freq: Once | ORAL | Status: AC
Start: 2014-12-18 — End: 2014-12-18
  Administered 2014-12-18: 1000 mg via ORAL
  Filled 2014-12-18: qty 2

## 2014-12-18 MED ORDER — CIPROFLOXACIN HCL 500 MG PO TABS
500.0000 mg | ORAL_TABLET | Freq: Two times a day (BID) | ORAL | Status: AC
Start: 2014-12-18 — End: 2014-12-28

## 2014-12-18 MED ORDER — METRONIDAZOLE 250 MG PO TABS
500.0000 mg | ORAL_TABLET | Freq: Once | ORAL | Status: AC
Start: 2014-12-18 — End: 2014-12-18
  Administered 2014-12-18: 500 mg via ORAL
  Filled 2014-12-18: qty 2

## 2014-12-18 MED ORDER — DICYCLOMINE HCL 10 MG PO CAPS
10.0000 mg | ORAL_CAPSULE | Freq: Four times a day (QID) | ORAL | Status: AC | PRN
Start: 2014-12-18 — End: 2015-01-02

## 2014-12-18 MED ORDER — CIPROFLOXACIN HCL 500 MG PO TABS
500.0000 mg | ORAL_TABLET | Freq: Once | ORAL | Status: AC
Start: 2014-12-18 — End: 2014-12-18
  Administered 2014-12-18: 500 mg via ORAL
  Filled 2014-12-18: qty 1

## 2014-12-18 MED ORDER — MORPHINE SULFATE 2 MG/ML IJ/IV SOLN (WRAP)
4.0000 mg | Freq: Once | Status: DC
Start: 2014-12-18 — End: 2014-12-19
  Filled 2014-12-18: qty 2

## 2014-12-18 MED ORDER — SODIUM CHLORIDE 0.9 % IV BOLUS
30.0000 mL/kg | Freq: Once | INTRAVENOUS | Status: DC
Start: 2014-12-18 — End: 2014-12-18

## 2014-12-18 MED ORDER — ONDANSETRON 4 MG PO TBDP
4.0000 mg | ORAL_TABLET | Freq: Four times a day (QID) | ORAL | Status: DC | PRN
Start: 2014-12-18 — End: 2015-03-09

## 2014-12-18 NOTE — Discharge Instructions (Signed)
Diarrhea    You have been diagnosed with diarrhea.    You have diarrhea when you have stools (bowel movements) that are soft or liquid, or when you have too many stools in a day. You may also have stomach cramps/ pains, nausea (feeling sick to your stomach), vomiting and fever (temperature higher than 100.51F / 38C).    Diarrhea can be caused by bacteria, viruses and parasites. People sometimes get "traveler s diarrhea." They get it when they go to other countries. It is caused by bacteria.    People with diarrhea often lose a lot of body fluids. This causes dehydration. Drink a lot of water or other fluids to stay hydrated. You may also be given medicine for your diarrhea. It will slow the diarrhea and stop the vomiting (if you have this symptom).    You should drink lots of natural juices or a sports-type drink that has electrolytes (sodium, potassium, etc). Do not drink a lot of plain water. Sugary drinks like apple and pear juice might make the diarrhea worse.    You might be given medicine to help you with your diarrhea, nausea (feeling sick), vomiting and stomach cramps. This will depend on your age, medical history and symptoms.    It is OK for you to go home today.    Good hygiene (keeping clean) helps to keep the problem from spreading. Please wash your hands often, using soap and water, especially after using the bathroom. Do not prepare food or share food, drinks or utensils (forks, knives, etc.) with other people.    While you were here today your stool may have been sent for special tests. In a few days, you will need to follow up with your regular doctor. You will get the test results and find out if you need any changes in your treatment. We will try to reach you if there are any results that need attention right away.    It is very important that you follow up with your regular doctor or a specialist. The doctor will tell you how soon this follow-up needs to be.    YOU SHOULD SEEK MEDICAL  ATTENTION IMMEDIATELY, EITHER HERE OR AT THE NEAREST EMERGENCY DEPARTMENT, IF ANY OF THE FOLLOWING OCCURS:   You are vomiting and cannot keep down fluids.   There is blood, pus or mucous in your stool.   You have symptoms of dehydration. These include dry mouth, not urinating (peeing) at least once every eight hours, feeling dizzy/lightheaded, severe weakness or passing out.              Dehydration    You have been diagnosed with dehydration.    Dehydration is when your body is low on fluids (liquids). Dehydration has a variety of causes. These range from vomiting and diarrhea, to excessive (a lot of) sweating and poor appetite.    You have received intravenous (IV) fluids. These are to help fix your dehydration. It is important to keep hydrating yourself at home. Drink plenty of fluids frequently. Drink fluids that won t upset your stomach. This includes water and juice. It also includes drinks like Gatorade. Stay away from beverages like soda pop and tea. They may make you worse. Avoid caffeine and alcohol. They may cause you to lose more fluid.    YOU SHOULD SEEK MEDICAL ATTENTION IMMEDIATELY, EITHER HERE OR AT THE NEAREST EMERGENCY DEPARTMENT, IF ANY OF THE FOLLOWING OCCURS:   Fever (temperature higher than 100.51F / 38C) or shaking  chills.   Constant vomiting and/or diarrhea.   Lightheadedness or fainting.   If you do not urinate (pee) for 8 or more hours.              Vomiting    You have been seen for vomiting.    Vomiting (throwing-up) can be caused by many different things. Most of the time the cause IS NOT serious. The doctor feels it is OK for you to go home today.    Common causes of vomiting include the following:   Gastroenteritis (stomach flu), usually with diarrhea.   Other illnesses. Sometimes medical conditions like diabetes, heart problems, headaches, or infections can make someone throw up.    Bowel obstructions (blockages) can cause vomiting and make patients unable to have  bowel movements (stool) or pass gas.   Vomiting can be a symptom of appendicitis, especially if there is also pain in the right lower abdomen (belly).    Sometimes it is hard to find out what is causing the vomiting. Vomiting can be treated with anti-nausea medicines like promethazine (Phenergan), prochlorperazine (Compazine) or ondansetron (Zofran).    Try to drink liquids to avoid dehydration. Don't drink a lot of fluid all at once. Take small sips throughout the day.    YOU SHOULD SEEK MEDICAL ATTENTION IMMEDIATELY, EITHER HERE OR AT THE NEAREST EMERGENCY DEPARTMENT, IF ANY OF THE FOLLOWING OCCURS:   You can't stop vomiting or your vomiting doesn't get better with medication.   You cannot keep liquids down.   You have severe sudden chest or belly pain after vomiting.   You have abdominal pain.          Abdominal Pain, Appendicitis     You have been seen for Abdominal Pain.    The doctor that saw you today was worried that your abdominal pain could be from appendicitis. Appendicitis is when a small part of your bowel gets inflamed and infected. It can happen at any age, but often happens to people 7-53 years old. People with appendicitis usually need surgery to get the appendix out. Not getting a surgery can be dangerous.    The tests you had today were not able to tell us if you have appendicitis or not. Appendicitis develops along a spectrum. This means that appendicitis may look like a minor problem at first but then it progresses and gets worse over time. You will need another exam within 12-24 hours to see how your symptoms change over time. At this time you do not need to stay in the hospital.    We don't believe your condition is dangerous right now. However, you need to be careful. Symptoms of appendicitis can progress. If you have these symptoms, come back sooner before your scheduled reexamination.     Abdominal pain that centers in the right lower part of the belly.   Loss of  appetite.   Throwing up a lot.   You have a fever (temperature higher than 100.18F or 38C) or chills.   Abdominal Pain that gets worse with coughing or any sudden movement.    Follow the instructions for any medication you get prescribed.     It is VERY IMPORTANT to come back here (or to your doctor if instructed) to get your belly re-checked. The doctor should tell you if he/she wants you to return in 12 or 24 hours.    YOU SHOULD SEEK MEDICAL ATTENTION IMMEDIATELY, EITHER HERE OR AT THE NEAREST EMERGENCY DEPARTMENT, IF ANY OF  THE FOLLOWING OCCURS:     You have belly pain that centers in the right lower part of the belly.   Your pain does not go away or gets worse.   You have a loss of appetite.   Throwing up a lot where you cannot keep fluids down or your vomit is dark green.    Any other symptoms, concerns, or not getting better as expected.   You get worse or feel you can t wait until your follow-up appointment to get treated.    If you can t follow up with your doctor, or if at any time you feel you need to be rechecked or seen again, come back here or go to the nearest emergency department.

## 2014-12-18 NOTE — ED Notes (Signed)
See quick triage

## 2014-12-18 NOTE — ED Provider Notes (Signed)
EMERGENCY DEPARTMENT HISTORY AND PHYSICAL EXAM     Physician/Midlevel provider first contact with patient: 12/18/14 2031         Date: 12/18/2014  Patient Name: Wendy Ortiz    History of Presenting Illness     Chief Complaint   Patient presents with   . Nausea   . Diarrhea       History Provided By: Pt    Chief Complaint: Diarrhea  Onset: 3 weeks ago  Timing: Intermittent  Location: GI  Quality: Watery, today with small amount of blood   Severity: Moderate  Modifying Factors:  Right after eating or drinking   Associated Symptoms: Nausea, vomiting, one noted occurrence of hematochezia.     Additional History: Wendy Ortiz is a 24 y.o. female c/o diarrhea with nausea and vomiting since 3 weeks ago. She also states she saw a small amount of blood in her stool today. Pt states she has diarrhea any time after eating or drinking and vomiting after any positional change. Pt was found to have C.diff in October 2013 after being admitted for psychiatric issues. She had been on abx at the time but states her sx do not resemble those from when she had C.diff. She does state that she has had intermittent, on and off diarrhea since being d/c'd from the hospital at that time.     PCP: Wendy See, MD   PCP - Dr. Vanna Ortiz (in [place].)       Current Facility-Administered Medications   Medication Dose Route Frequency Provider Last Rate Last Dose   . morphine injection 4 mg  4 mg Intravenous Once Louis Matte, MD   4 mg at 12/18/14 2110     Current Outpatient Prescriptions   Medication Sig Dispense Refill   . FLUoxetine HCl (PROZAC PO) Take by mouth.     Marland Kitchen LORazepam (ATIVAN PO) Take by mouth.     . Zolpidem Tartrate (AMBIEN PO) Take by mouth.     . ciprofloxacin (CIPRO) 500 MG tablet Take 1 tablet (500 mg total) by mouth 2 (two) times daily. 20 tablet 0   . dicyclomine (BENTYL) 10 MG capsule Take 1 capsule (10 mg total) by mouth every 6 (six) hours as needed (abdominal pain). 18 capsule 0   . metroNIDAZOLE  (FLAGYL) 500 MG tablet Take 1 tablet (500 mg total) by mouth 3 (three) times daily. 30 tablet 0   . ondansetron (ZOFRAN ODT) 4 MG disintegrating tablet Take 1 tablet (4 mg total) by mouth every 6 (six) hours as needed for Nausea. 15 tablet 0       Past History     Past Medical History:  Past Medical History   Diagnosis Date   . Asthma        Past Surgical History:  History reviewed. No pertinent past surgical history.    Family History:  History reviewed. No pertinent family history.    Social History:  History   Substance Use Topics   . Smoking status: Heavy Tobacco Smoker -- 0.50 packs/day     Types: Cigarettes   . Smokeless tobacco: Not on file   . Alcohol Use: Yes      Comment: socially        Allergies:  Allergies   Allergen Reactions   . Penicillins Anaphylaxis             Review of Systems   Review of Systems   Constitutional: Negative for chills and diaphoresis.   HENT:  Negative for congestion.    Respiratory: Negative for shortness of breath.    Cardiovascular: Negative for leg swelling.   Gastrointestinal: Positive for nausea, vomiting, diarrhea and blood in stool (1 noted occurrence).   Genitourinary: Negative for hematuria and difficulty urinating.   Musculoskeletal: Negative for arthralgias.   Skin: Negative for rash.   Neurological: Negative for dizziness.   Hematological: Does not bruise/bleed easily.          Physical Exam   BP 115/63 mmHg  Pulse 84  Temp(Src) 99.4 F (37.4 C) (Tympanic)  Resp 14  Ht 5\' 4"  (1.626 m)  Wt 74.844 kg  BMI 28.31 kg/m2  SpO2 100%  LMP 11/30/2014 (Approximate)    Physical Exam   Constitutional: She is oriented to person, place, and time and well-developed, well-nourished, and in no distress. No distress.   HENT:   Head: Normocephalic and atraumatic.   Nose: Nose normal.   Mouth/Throat: Oropharynx is clear and moist.   Eyes: Conjunctivae are normal. Right eye exhibits no discharge. Left eye exhibits no discharge.   Neck: Normal range of motion. Neck supple.    Cardiovascular: Normal rate, regular rhythm, normal heart sounds and intact distal pulses.  Exam reveals no gallop and no friction rub.    No murmur heard.  Pulmonary/Chest: Effort normal and breath sounds normal. No respiratory distress. She has no wheezes. She has no rales. She exhibits no tenderness.   Abdominal: Soft. Bowel sounds are normal. She exhibits no distension and no mass. There is no tenderness. There is no rebound and no guarding.   Musculoskeletal: Normal range of motion. She exhibits no edema or tenderness.   Lymphadenopathy:     She has no cervical adenopathy.   Neurological: She is alert and oriented to person, place, and time. Gait normal. GCS score is 15.   Skin: Skin is warm and dry. No rash noted. She is not diaphoretic. No erythema.   Psychiatric: Memory and affect normal.   Nursing note and vitals reviewed.        Diagnostic Study Results     Labs -     Results     Procedure Component Value Units Date/Time    Stool Culture [[id number]] Collected:  12/18/14 2202    Specimen Information:  Stool / Stool Updated:  12/18/14 2202    Narrative:      DELAY AFFECTS RESULT    Stool for C. Diff. [[id number]] Collected:  12/18/14 2202    Specimen Information:  Stool / Stool Updated:  12/18/14 2202    Narrative:      Patient's current admission began:->less than or equal to 3  days ago  Indication:->History of C diff  DELAY AFFECTS RESULT    Beta HCG, Qual, Urine [[id number]] Collected:  12/18/14 2052    Specimen Information:  Urine Updated:  12/18/14 2128     Urine HCG Qualitative Negative     PT/APTT [[id number]]  (Abnormal) Collected:  12/18/14 2051     PT 12.3 (L) sec Updated:  12/18/14 2128     PT INR 0.9      PT Anticoag. Given Within 48 hrs. None      PTT 28 sec     UA with reflex to micro (all hospital ED's and [facility]) [[id number]] Collected:  12/18/14 2043    Specimen Information:  Urine Updated:  12/18/14 2128     Urine Type Clean Catch      Color, UA Straw      Clarity,  UA Clear       Specific Gravity UA 1.005      Urine pH 6.0      Leukocyte Esterase, UA Negative      Nitrite, UA Negative      Protein, UR Negative      Glucose, UA Negative      Ketones UA Negative      Urobilinogen, UA Normal mg/dL      Bilirubin, UA Negative      Blood, UA Negative     Comprehensive metabolic panel (CMP) [[id number]]  (Abnormal) Collected:  12/18/14 2051    Specimen Information:  Blood Updated:  12/18/14 2119     Glucose 116 (H) mg/dL      BUN 8.0 mg/dL      Creatinine 0.8 mg/dL      Sodium 644 (L) mEq/L      Potassium 3.5 mEq/L      Chloride 102 mEq/L      CO2 23 mEq/L      CALCIUM 9.1 mg/dL      Protein, Total 6.8 g/dL      Albumin 3.6 g/dL      AST (SGOT) 27 U/L      ALT 18 U/L      Alkaline Phosphatase 57 U/L      Bilirubin, Total 0.2 mg/dL      Globulin 3.2 g/dL      Albumin/Globulin Ratio 1.1      Anion Gap 10.0     GFR [[id number]] Collected:  12/18/14 2051     EGFR >60.0 Updated:  12/18/14 2119    CBC with differential [[id number]]  (Abnormal) Collected:  12/18/14 2051    Specimen Information:  Blood / Blood Updated:  12/18/14 2113     WBC 7.54 x10 3/uL      Hgb 12.3 g/dL      Hematocrit 29.5 (L) %      Platelets 204 x10 3/uL      RBC 4.07 (L) x10 6/uL      MCV 88.5 fL      MCH 30.2 pg      MCHC 34.2 g/dL      RDW 13 %      MPV 11.6 fL      Neutrophils 88 %      Lymphocytes Automated 7 %      Monocytes 5 %      Eosinophils Automated 0 %      Basophils Automated 0 %      Immature Granulocyte Unmeasured %      Neutrophils Absolute 6.61 x10 3/uL      Abs Lymph Automated 0.54 x10 3/uL      Abs Mono Automated 0.38 x10 3/uL      Abs Eos Automated 0.00 x10 3/uL      Absolute Baso Automated 0.01 x10 3/uL      Absolute Immature Granulocyte Unmeasured x10 3/uL     i-Stat Lactic AcID [[id number]] Collected:  12/18/14 2042     i-STAT Lactic acid 0.7 mmol/L Updated:  12/18/14 2111    Blood Culture #2 [[id number]] Collected:  12/18/14 2108    Specimen Information:  Blood, Intravenous Line Updated:  12/18/14 2108     Narrative:      1 BLUE+1 PURPLE    Blood Culture #1 [[id number]] Collected:  12/18/14 2051    Specimen Information:  Blood, Intravenous Line Updated:  12/18/14 2051    Narrative:      1 BLUE+1 PURPLE  Urine culture [[id number]] Collected:  12/18/14 2043    Specimen Information:  Urine / Urine, Clean Catch Updated:  12/18/14 2044          Radiologic Studies -   Radiology Results (24 Hour)     Procedure Component Value Units Date/Time    Chest AP Portable [[id number]] Collected:  12/18/14 2157    Order Status:  Completed Updated:  12/18/14 2201    Narrative:      CLINICAL INDICATION: vomiting and diarrhea     COMPARISON: None available    INTERPRETATION:   A single frontal view of the chest was obtained. .     The cardiomediastinal contour  is within normal limits for age. The  lungs are clear and the sulci are sharp.         Impression:        No acute cardiopulmonary process.    Wyatt Portela, MD   12/18/2014 9:57 PM        .      Medical Decision Making   I am the first provider for this patient.    I reviewed the vital signs, available nursing notes, past medical history, past surgical history, family history and social history.    Vital Signs-Reviewed the patient's vital signs.     Patient Vitals for the past 12 hrs:   BP Temp Pulse Resp   12/18/14 2224 115/63 mmHg 99.4 F (37.4 C) 84 14   12/18/14 2105 130/68 mmHg - 88 (!) 33   12/18/14 2012 123/71 mmHg (!) 102.2 F (39 C) (!) 107 16       Pulse Oximetry Analysis - Normal 100% on RA    Cardiac Monitor:  Rate: 93  Rhythm:  Normal Sinus Rhythm     EKG:  Interpreted by the EP.   Time Interpreted:  2059   Rate: 88   Rhythm: Normal Sinus Rhythm    Interpretation: Normal axis. Normal intervals. Normal ST segments. Normal t-waves. Normal EKG.   Comparison: No prior study is available for comparison.    Old Medical Records: Nursing notes.     ED Course: 2026 - Discussed with pt plan for labs, CXR, EKG, urine. Pt agreeable.   10:22 PM - Pt tolerated PO well.  10:44 PM -  Pt informed of all results. Discussed with pt plan for medication use at home, f/u plans, return precautions. She agrees with plan and is stable and amenable to discharge. Also discussed results of stool culture when they come back.     Provider Notes:   23yo female with several weeks of vomiting and diarrhea. She was febrile and tachycardic in triage which initiated a sepsis work up. She is actually very well appearing on exam. She has reassuring labs including lactate here. She provided stool sample which was sent to the lab. She was tolerating PO here without difficulty. Will treat her diarrhea with abx given her length of symptoms and fever here. Her abdomen was soft and not tender. She does not have clinical appendicitis, but I can not completely exclude early disease. I discussed this with her and gave strict return precautions. [place] home. PCP follow up directed. GI referral given.    Diagnosis     Clinical Impression:   1. Vomiting and diarrhea        Disposition:   ED Disposition     Discharge Wendy Ortiz discharge to home/self care.    Condition at disposition: Stable  _______________________________    Attestations:  This note is prepared by Latrelle Dodrill acting as scribe for Louis Matte, MD.   Louis Matte, MD - The scribe's documentation has been prepared under my direction and personally reviewed by me in its entirety.  I confirm that the note above accurately reflects all work, treatment, procedures, and medical decision making performed by me.    _______________________________          Louis Matte, MD  12/19/14 [id number]

## 2014-12-19 NOTE — Progress Notes (Signed)
Quick Note:    Negative culture. No follow-up needed.  ______

## 2014-12-20 LAB — ECG 12-LEAD
Atrial Rate: 88 {beats}/min
P Axis: 44 degrees
P-R Interval: 148 ms
Q-T Interval: 344 ms
QRS Duration: 92 ms
QTC Calculation (Bezet): 416 ms
R Axis: -20 degrees
T Axis: 61 degrees
Ventricular Rate: 88 {beats}/min

## 2014-12-20 NOTE — Progress Notes (Signed)
Quick Note:    Negative culture. No follow-up needed.  ______

## 2015-03-09 ENCOUNTER — Emergency Department: Payer: Self-pay

## 2015-03-09 ENCOUNTER — Emergency Department
Admission: EM | Admit: 2015-03-09 | Discharge: 2015-03-09 | Disposition: A | Discharge status: Home / Self Care | Payer: Medicaid Other | Attending: Emergency Medicine | Admitting: Emergency Medicine

## 2015-03-09 DIAGNOSIS — F17210 Nicotine dependence, cigarettes, uncomplicated: Secondary | ICD-10-CM | POA: Insufficient documentation

## 2015-03-09 DIAGNOSIS — M2662 Arthralgia of temporomandibular joint: Secondary | ICD-10-CM | POA: Insufficient documentation

## 2015-03-09 DIAGNOSIS — M26629 Arthralgia of temporomandibular joint, unspecified side: Secondary | ICD-10-CM

## 2015-03-09 DIAGNOSIS — R197 Diarrhea, unspecified: Secondary | ICD-10-CM | POA: Insufficient documentation

## 2015-03-09 HISTORY — DX: Migraine, unspecified, not intractable, without status migrainosus: G43.909

## 2015-03-09 MED ORDER — HYDROCODONE-ACETAMINOPHEN 5-325 MG PO TABS
1.0000 | ORAL_TABLET | Freq: Once | ORAL | Status: AC
Start: 2015-03-09 — End: 2015-03-09
  Administered 2015-03-09: 1 via ORAL
  Filled 2015-03-09: qty 1

## 2015-03-09 MED ORDER — IBUPROFEN 600 MG PO TABS
600.0000 mg | ORAL_TABLET | Freq: Once | ORAL | Status: AC
Start: 2015-03-09 — End: 2015-03-09
  Administered 2015-03-09: 600 mg via ORAL
  Filled 2015-03-09: qty 1

## 2015-03-09 MED ORDER — IBUPROFEN 600 MG PO TABS
600.0000 mg | ORAL_TABLET | Freq: Four times a day (QID) | ORAL | Status: DC | PRN
Start: 2015-03-09 — End: 2016-04-25

## 2015-03-09 MED ORDER — HYDROCODONE-ACETAMINOPHEN 5-325 MG PO TABS
1.0000 | ORAL_TABLET | Freq: Four times a day (QID) | ORAL | Status: DC | PRN
Start: 2015-03-09 — End: 2015-07-24

## 2015-03-09 NOTE — ED Notes (Signed)
Patient c/o migraine x 4 days. Reports photosensitivity/n/v/decreased appetite. Also reports L otalgia and fever at home. Reports taking tylenol around 1900 and Imitrex for migraine with no relief. BP 127/74 mmHg  Pulse 88  Temp(Src) 99.3 F (37.4 C)  Resp 16  SpO2 98%

## 2015-03-09 NOTE — Discharge Instructions (Signed)
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    This is a common condition. Estimates show that it affects up to 10 million people in the [place].    TMJ syndrome is caused by pain in and around the jaw joint. The pain may be caused by overuse (teeth clenching). It can also be caused by teeth grinding, muscle pain or injury to the joint. Jaw dislocation and joint degeneration (breakdown) are also causes.    Some TMJ symptoms are:   Dull pain in the jaw and more pain with chewing. The jaw may click or it may be hard to open the mouth.   Earache, headache and/or neck pain.   The mouth may not open all the way. The TMJ and muscles may be tender (painful).    TMJ is generally treated with pain medicine (like Advil or Motrin) and warm compresses. Narcotic pain medicines and benzodiazepines (Valium-like drugs) are sometimes used to treat more severe (serious) TMJ.   Non-prescription medicines like Advil or Motrin are usually effective. The recommended dose for ibuprofen is 600mg every 6-8 hours (three 200mg tablets). Ibuprofen is the active ingredient in Advil and Motrin.   Avoid stress and teeth clenching or grinding. Moist compresses (such a warm, wet washcloth) and massage of the muscles around the jaw may be helpful.    Follow-up with your family doctor, an Ear, Nose, and Throat, (ENT) doctor or oral surgeon is usually recommended.    YOU SHOULD SEEK MEDICAL ATTENTION IMMEDIATELY, EITHER HERE OR AT THE NEAREST EMERGENCY DEPARTMENT, IF ANY OF THE FOLLOWING OCCURS:   Any major change in your symptoms.   Swelling and redness to the side of your face, severe (very bad) headache, neck stiffness, fever (temperature higher than 100.4F / 38C).

## 2015-03-09 NOTE — ED Provider Notes (Signed)
EMERGENCY DEPARTMENT HISTORY AND PHYSICAL EXAM    Date: 03/09/2015   Physician/Midlevel provider first contact with patient: 03/09/15 2227       Patient Name: Wendy Ortiz  Attending Physician: Harless Litten, MD  Mid-level: Tula Nakayama, PA-C      History of Presenting Illness     Chief Complaint   Patient presents with   . Migraine   . Otalgia     Left       History Provided By: patient    Chief Complaint: Lt ear pain     Wendy Ortiz is a 24 y.o. female complaining of Lt ear and head pain for the past 4 days, worse with chewing.  Pt states she has had a clicking in her jaw with the pain.  Pt states she thought she was having a migraine and took Imitrex with no change in her sx.  Pt has had some congestion and has taken Motrin and Tylenol with no change in pain.  Pt with ongoing diarrhea and has scheduled colonoscopy next week.  Pt denies any MS or neuro changes, neck, back, or joint pain.  Pt denies any injury or trauma.    PCP: Pcp, Noneorunknown, MD    No current facility-administered medications for this encounter.     Current Outpatient Prescriptions   Medication Sig Dispense Refill   . FLUoxetine HCl (PROZAC PO) Take 40 mg by mouth.        Marland Kitchen HYDROcodone-acetaminophen (NORCO) 5-325 MG per tablet Take 1 tablet by mouth every 6 (six) hours as needed for Pain. 20 tablet 0   . ibuprofen (ADVIL,MOTRIN) 600 MG tablet Take 1 tablet (600 mg total) by mouth every 6 (six) hours as needed for Pain. 60 tablet 1   . LORazepam (ATIVAN PO) Take 0.5 mg by mouth.        . Zolpidem Tartrate (AMBIEN PO) Take 10 mg by mouth.            Past Medical History     Past Medical History   Diagnosis Date   . Asthma    . Migraines      History reviewed. No pertinent past surgical history.    Family History     History reviewed. No pertinent family history.    Social History     History     Social History   . Marital Status: Single     Spouse Name: N/A   . Number of Children: N/A   . Years of Education: N/A     Social History  Main Topics   . Smoking status: Heavy Tobacco Smoker -- 0.50 packs/day     Types: Cigarettes   . Smokeless tobacco: Not on file   . Alcohol Use: Yes      Comment: socially    . Drug Use: No   . Sexual Activity: Not on file     Other Topics Concern   . Not on file     Social History Narrative         Allergies     Allergies   Allergen Reactions   . Penicillins Anaphylaxis             Review of Systems     Review of Systems   Constitutional: Negative for fever and chills.   HENT: Positive for congestion and ear pain. Negative for ear discharge, hearing loss, sore throat and tinnitus.    Respiratory: Negative for cough, shortness of breath and stridor.  Cardiovascular: Negative for chest pain and leg swelling.   Gastrointestinal: Positive for diarrhea (ongoing). Negative for nausea, vomiting and abdominal pain.   Musculoskeletal: Negative for neck pain.   Skin: Negative for rash.   Neurological: Positive for headaches. Negative for dizziness and weakness.   Endo/Heme/Allergies: Does not bruise/bleed easily.   All other systems reviewed and are negative.          Physical Exam   BP 127/74 mmHg  Pulse 88  Temp(Src) 99.3 F (37.4 C)  Resp 16  SpO2 98%    CONSTITUTIONAL:  Oriented to person, place, and time.  Well-developed and well-nourished.  No acute distress. Non-toxic appearance.  Vital signs reviewed.  EYES:  Bilateral pupils equal and reactive.  No injection, drainage, swelling, or erythema.  RIGHT EAR:  No lacerations. No erythema, drainage, swelling, or tenderness. No foreign bodies. No mastoid tenderness. Tympanic membrane is not injected, not scarred, not perforated, not erythematous, not retracted, and not bulging. No hemotympanum.   LEFT EAR:   No lacerations. No erythema, drainage, swelling, or tenderness. No foreign bodies. MIld to moderate RT TMJ region tenderness, worse with jaw movement.  No mastoid tenderness. Tympanic membrane is not injected, not scarred, not perforated, not erythematous, not  retracted, and not bulging. No hemotympanum.   NOSE:  Mild crusting.  No mucosal edema, rhinorrhea, nose lacerations, nasal deformity, septal deviation, or nasal septal hematoma. No epistaxis.  No foreign bodies. No maxillary or frontal sinus tenderness.  MOUTH/THROAT:  Oropharynx is clear and moist.  Mild posterior pharyngeal erythema without tonsillar exudates, masses, or lesions.  NECK:  Full active and passive range of motion without pain.  Neck supple. No spinous process tenderness and no muscular tenderness present.  No rigidity.  No lymphadenopathy.  No meningeal signs.  RESP:  No respiratory distress.  Breath sounds with no rales, wheezing, or rhonchi. No chest wall tenderness.  CARDIAC:  Regular rate and rhythm.  Heart sounds with no murmurs, gallop, or friction rub.  ABD:  Normal bowel sounds x4 quadrants.  Soft.  Nontender.  Nondistended.  No organomegaly.  No peritoneal signs.  No signs of trauma or contusions.  No palpable pulsatile masses.  BACK:  No CVA tenderness.  MS:  No lacerations, contusions, or deformity.  Full active and passive range of motion. 2+ pulses.  No edema or calf tenderness.   SKIN:  Skin is warm and dry. No rashes, lesions, contusions, or drainage.  NEURO:  A0X3.  CN II-XII grossly intact.  5/5 strength in bilateral upper and lower extremities. 3+ DTRs.  Sensory intact throughout  PSYCH:  Mood, memory, affect, and judgment normal for age.      Diagnostic Study Results     Labs -     Results     ** No results found for the last 24 hours. **          Radiologic Studies -   Radiology Results (24 Hour)     ** No results found for the last 24 hours. **          Clinical Course in the Emergency Department     10:38 PM  NOrco 5mg  PO and Motrin 600mg  PO ordered.  Pt will f/u with her dentist for re-eval.  Will also refer to [facility] for re-eval.  Soft diet, liquids.  Return for worse pain, fevers, SM or neuro changes, or problems.  Reviewed case with Dr Carolynn Comment.      Medical Decision Making  Differential diagnosis considered in this case:  TMJ, otitis      I reviewed the vital signs, available nursing notes, past medical history, past surgical history, family history and social history.    Vital Signs-Reviewed the patient's vital signs.     Patient Vitals for the past 12 hrs:   BP Temp Pulse Resp   03/09/15 2215 127/74 mmHg 99.3 F (37.4 C) 88 16         Diagnosis and Treatment Plan       Clinical Impression:   1. TMJ arthralgia            Attending's Note (MLP) Attestestation           Tula Nakayama, [place]  03/09/15 2245    Harless Litten, MD  03/10/15 [phone]

## 2015-07-24 ENCOUNTER — Emergency Department: Payer: Charity

## 2015-07-24 ENCOUNTER — Emergency Department: Payer: Medicaid Other

## 2015-07-24 ENCOUNTER — Emergency Department: Payer: Medicaid HMO

## 2015-07-24 ENCOUNTER — Emergency Department
Admission: EM | Admit: 2015-07-24 | Discharge: 2015-07-25 | Disposition: A | Discharge status: Home / Self Care | Payer: Medicaid HMO | Attending: Emergency Medicine | Admitting: Emergency Medicine

## 2015-07-24 ENCOUNTER — Emergency Department
Admission: EM | Admit: 2015-07-24 | Discharge: 2015-07-24 | Disposition: A | Discharge status: Other Acute Inpatient Hospital | Payer: Medicaid Other | Attending: Emergency Medicine | Admitting: Emergency Medicine

## 2015-07-24 DIAGNOSIS — Z3A18 18 weeks gestation of pregnancy: Secondary | ICD-10-CM | POA: Insufficient documentation

## 2015-07-24 DIAGNOSIS — O9989 Other specified diseases and conditions complicating pregnancy, childbirth and the puerperium: Secondary | ICD-10-CM | POA: Insufficient documentation

## 2015-07-24 DIAGNOSIS — R1031 Right lower quadrant pain: Secondary | ICD-10-CM | POA: Insufficient documentation

## 2015-07-24 DIAGNOSIS — O99332 Smoking (tobacco) complicating pregnancy, second trimester: Secondary | ICD-10-CM | POA: Insufficient documentation

## 2015-07-24 DIAGNOSIS — F17210 Nicotine dependence, cigarettes, uncomplicated: Secondary | ICD-10-CM | POA: Insufficient documentation

## 2015-07-24 DIAGNOSIS — O26892 Other specified pregnancy related conditions, second trimester: Secondary | ICD-10-CM

## 2015-07-24 DIAGNOSIS — R112 Nausea with vomiting, unspecified: Secondary | ICD-10-CM | POA: Insufficient documentation

## 2015-07-24 DIAGNOSIS — R109 Unspecified abdominal pain: Secondary | ICD-10-CM

## 2015-07-24 LAB — CBC WITH MANUAL DIFFERENTIAL
Band Neutrophils Absolute: 0.59 10*3/uL (ref 0.00–1.00)
Band Neutrophils: 6 %
Basophils Absolute Manual: 0.1 10*3/uL (ref 0.00–0.20)
Basophils Manual: 1 %
Cell Morphology: NORMAL
Eosinophils Absolute Manual: 0.1 10*3/uL (ref 0.00–0.70)
Eosinophils Manual: 1 %
Hematocrit: 31.9 % — ABNORMAL LOW (ref 37.0–47.0)
Hgb: 10.9 g/dL — ABNORMAL LOW (ref 12.0–16.0)
Lymphocytes Absolute Manual: 1.18 10*3/uL (ref 0.50–4.40)
Lymphocytes Manual: 12 %
MCH: 30 pg (ref 28.0–32.0)
MCHC: 34.2 g/dL (ref 32.0–36.0)
MCV: 87.9 fL (ref 80.0–100.0)
MPV: 12.2 fL (ref 9.4–12.3)
Monocytes Absolute: 0.59 10*3/uL (ref 0.00–1.20)
Monocytes Manual: 6 %
Neutrophils Absolute Manual: 7.27 10*3/uL (ref 1.80–8.10)
Nucleated RBC: 0 /100 WBC (ref 0–1)
Platelets: 185 10*3/uL (ref 140–400)
RBC: 3.63 10*6/uL — ABNORMAL LOW (ref 4.20–5.40)
RDW: 13 % (ref 12–15)
Segmented Neutrophils: 74 %
WBC: 9.82 10*3/uL (ref 3.50–10.80)

## 2015-07-24 LAB — COMPREHENSIVE METABOLIC PANEL
ALT: 15 U/L (ref 0–55)
AST (SGOT): 18 U/L (ref 5–34)
Albumin/Globulin Ratio: 1.1 (ref 0.9–2.2)
Albumin: 3.2 g/dL — ABNORMAL LOW (ref 3.5–5.0)
Alkaline Phosphatase: 49 U/L (ref 37–106)
Anion Gap: 8 (ref 5.0–15.0)
BUN: 5 mg/dL — ABNORMAL LOW (ref 7–19)
Bilirubin, Total: 0.1 mg/dL — ABNORMAL LOW (ref 0.2–1.2)
CO2: 21 mEq/L — ABNORMAL LOW (ref 22–29)
Calcium: 8.8 mg/dL (ref 8.5–10.5)
Chloride: 107 mEq/L (ref 100–111)
Creatinine: 0.7 mg/dL (ref 0.6–1.0)
Globulin: 2.8 g/dL (ref 2.0–3.6)
Glucose: 103 mg/dL — ABNORMAL HIGH (ref 70–100)
Potassium: 3.8 mEq/L (ref 3.5–5.1)
Protein, Total: 6 g/dL (ref 6.0–8.3)
Sodium: 136 mEq/L (ref 136–145)

## 2015-07-24 LAB — URINALYSIS, REFLEX TO MICROSCOPIC EXAM IF INDICATED
Bilirubin, UA: NEGATIVE
Blood, UA: NEGATIVE
Glucose, UA: NEGATIVE
Ketones UA: NEGATIVE
Leukocyte Esterase, UA: NEGATIVE
Nitrite, UA: NEGATIVE
Protein, UR: 30 — AB
Specific Gravity UA: 1.021 (ref 1.001–1.035)
Urine pH: 6 (ref 5.0–8.0)
Urobilinogen, UA: NEGATIVE mg/dL

## 2015-07-24 LAB — ABO/RH: ABO Rh: B POS

## 2015-07-24 LAB — LIPASE: Lipase: 23 U/L (ref 8–78)

## 2015-07-24 LAB — GFR: EGFR: >60

## 2015-07-24 MED ORDER — PROMETHAZINE HCL 25 MG/ML IJ SOLN
12.5000 mg | Freq: Once | INTRAMUSCULAR | Status: AC
Start: 2015-07-24 — End: 2015-07-24
  Administered 2015-07-24: 12.5 mg via INTRAVENOUS
  Filled 2015-07-24: qty 1

## 2015-07-24 MED ORDER — SODIUM CHLORIDE 0.9 % IV SOLN
INTRAVENOUS | Status: DC
Start: 2015-07-24 — End: 2015-07-24

## 2015-07-24 MED ORDER — MORPHINE SULFATE 4 MG/ML IJ/IV SOLN (WRAP)
4.0000 mg | Freq: Once | Status: AC
Start: 2015-07-24 — End: 2015-07-24
  Administered 2015-07-24: 4 mg via INTRAVENOUS
  Filled 2015-07-24: qty 1

## 2015-07-24 MED ORDER — HYDROMORPHONE HCL 1 MG/ML IJ SOLN
1.0000 mg | Freq: Once | INTRAMUSCULAR | Status: AC
Start: 2015-07-24 — End: 2015-07-24
  Administered 2015-07-24: 1 mg via INTRAVENOUS
  Filled 2015-07-24: qty 1

## 2015-07-24 NOTE — ED Notes (Signed)
Called MRI to get ETA. Per MRI tech, he has patient on the table now, will be approx to 1 hour before they send for ms. Deiss.

## 2015-07-24 NOTE — Discharge Instructions (Signed)
Study results to go  NPO  Patient to go to [facility] - ED now  Case was discussed with Dr Imogene Burn

## 2015-07-24 NOTE — ED Provider Notes (Addendum)
[facility] EMERGENCY DEPARTMENT ATTENDING PHYSICIAN NOTE     CLINICAL SUMMARY          Diagnosis:    .     Final diagnoses:   Abdominal pain in pregnancy, second trimester         Disposition:       ED Disposition     Discharge Wendy Ortiz discharge to home/self care.    Condition at disposition: Stable               Discharge Prescriptions     None                                            CLINICAL INFORMATION        HPI:       24 y.o. female [redacted] weeks pregnant, transferred from [facility], p/w RLQ abdominal pain, ongoing for the past 3 days, progressively worsening. Pain has been constant, worsens with movement and palpation. Pt with associated vomiting, ongoing for the past 3 days as well. She denies any changes to her back pain (notes that she has had back pain since 11-12 weeks of pregnancy, but told it was normal by her OB). She was evaluated at [facility] ER, underwent an U/S which did not adequately visualize appendix (showed single IUP at 19 weeks), so she was transferred to [facility] for MRI r/o appendicitis.   She has never had any abdominal surgeries.      History obtained from:  Patient and review of prior record      ROS:      All systems are negative except as noted in the HPI.        Physical Exam:      Pulse 72  BP 128/60 mmHg  Resp 17  SpO2 100 %  Temp 98 F (36.7 C)    Nursing note and vitals reviewed.    Constitutional:  Comfortable. Full sentences.  Psych:  Normal affect. Cooperative.   Eyes: No conjunctival injection. No discharge.  Ears, Nose, Mouth and Throat:Tolerating secretions.  No hoarseness.  Respiratory: No respiratory distress. No retractions.    Cardiovascular:  Abdomen: Soft. Gravid. Tender in RLQ.    Musculoskeletal:           Neck:           Back:  No CVA tenderness.           Upper Extremity:           Lower Extremity:   Neurological:   Integumentary:   GU:   Lymphatic:                PAST HISTORY        Primary Care Provider: Christa See, MD        PMH/PSH:       Rafael Bihari has a past medical history of Asthma and Migraines.  She has no past surgical history on file.      Social/Family History:      She reports that she has been smoking Cigarettes.  She has been smoking about 0.50 packs per day. She does not have any smokeless tobacco history on file. She reports that she drinks alcohol. She reports that she does not use illicit drugs.  Her family history is not on file.      Listed Medications on Arrival:    .  Home Medications                   ibuprofen (ADVIL,MOTRIN) 600 MG tablet     Take 1 tablet (600 mg total) by mouth every 6 (six) hours as needed for Pain.                                                Allergies: She is allergic to penicillins.            VISIT INFORMATION        Clinical Course & Medical Decision Making:      9:05 PM:  Discussed with radiologist. MRI approved.    At 9:08 PM:  24 y.o. female p/w abdominal pain in her 2nd trimester as described above.  IUP confirmed by ultrasound. Will check MRI to r/o appendicitis.  NPO. Re-eval.     At 1:38 AM:  MRI shows no signs of appendicitis (although appendix was not visualized w/ certainty)  Afebrile  Abdomen soft, minimal suprapubic tenderness. No RLQ tenderness. No peritoneal signs.   Will d/c  Return instructions given  Pt a/w plan.       Medications Given in the ED:    .     ED Medication Orders     Start Ordered     Status Ordering Provider    07/24/15 2255 07/24/15 2255  HYDROmorphone (DILAUDID) injection 1 mg   Once     Route: Intravenous  Ordered Dose: 1 mg     Last MAR action:  Given Russel Morain            Procedures:      Procedures      Interpretations:                  RESULTS        Lab Results:      Results     ** No results found for the last 24 hours. **              Radiology Results:      MRI Abdomen WO Contrast   Final Result     MR examination of the abdomen for appendicitis shows no   positive evidence of appendicitis.  The appendix is not visualized with   certainty.  There  is no apparent periappendiceal induration.        Minimal nonspecific pelvic ascites is seen.  The examination shows no   definite acute intra-abdominal abnormality. The examination shows no   definite bowel abnormality.           Miguel Dibble, MD    07/25/2015 1:27 AM                     Scribe Attestation:      I was acting as a Neurosurgeon for Delorse Lek, MD on [name] D  Treatment Team: Scribe: Lora Paula     I am the first provider for this patient and I personally performed the services documented. Treatment Team: Scribe: Lora Paula is scribing for me on Maestre,Felipa D. This note and the patient instructions accurately reflect work and decisions made by me.  Delorse Lek, MD               Delorse Lek, MD  07/25/15 [phone]

## 2015-07-24 NOTE — ED Notes (Signed)
Patient states she is [redacted] weeks pregnant and is having right sided sharp abdominal pain that started four days ago. Denies vaginal bleeding

## 2015-07-24 NOTE — ED Provider Notes (Signed)
Physician/Midlevel provider first contact with patient: 07/24/15 1619         History     Chief Complaint   Patient presents with   . Pregnancy Problem     HPI     Time seen: 1622 - #02    Historian: patient    Chief complaint: RLQ pain    HPI: the pt p/w the gradual onset of pain in the RLQ with nausea and vomiting, but no lack of   appetite; she is ~ [redacted] weeks pregnant without vaginal bleeding    Time of onset: three days ago    Severity: moderate-severe    Quality: sharp pain - hurts to move or cough     Improved by: nothing    Worsened by: nothing    Accompanied by: no fever/chills    Complaint experienced before: no    Past Medical History   Diagnosis Date   . Asthma    . Migraines      History reviewed. No pertinent past surgical history.    No family history on file.    Social  Social History   Substance Use Topics   . Smoking status: Heavy Tobacco Smoker -- 0.50 packs/day     Types: Cigarettes   . Smokeless tobacco: None   . Alcohol Use: Yes      Comment: socially    .   Allergies   Allergen Reactions   . Penicillins Anaphylaxis           Home Medications     Last Medication Reconciliation Action:  Complete Ellamae Sia, RN 07/24/2015  4:14 PM                                                                Flagged for Removal             ibuprofen (ADVIL,MOTRIN) 600 MG tablet     Take 1 tablet (600 mg total) by mouth every 6 (six) hours as needed for Pain.         Review of Systems   Constitutional: Negative for fever, chills and diaphoresis.   HENT: Negative for congestion and sore throat.    Respiratory: Negative for shortness of breath.    Cardiovascular: Negative for chest pain and leg swelling.   Gastrointestinal: Positive for nausea, vomiting and abdominal pain. Negative for diarrhea and constipation.   Genitourinary: Negative for dysuria, urgency, frequency, flank pain, vaginal bleeding, vaginal discharge, vaginal pain and pelvic pain.   Musculoskeletal: Negative for back pain, joint swelling,  arthralgias and neck pain.   Skin: Negative for pallor and rash.   Neurological: Negative for dizziness, weakness, light-headedness and headaches.   Psychiatric/Behavioral: Negative for confusion and decreased concentration.     Physical Exam    BP: 108/64 mmHg, Heart Rate: 97, Temp: 97 F (36.1 C), Resp Rate: 16, SpO2: 100 %    Physical Exam   Constitutional: She is oriented to person, place, and time. She appears well-developed. She appears distressed.   Distressed from lower abdominal pain - pain with   position changes on the stretcher   HENT:   Mouth/Throat: Oropharynx is clear and moist. No oropharyngeal exudate.   Neck: Normal range of motion. Neck supple.   Cardiovascular: Normal rate, regular  rhythm and normal heart sounds.    No murmur heard.  Pulmonary/Chest: Effort normal and breath sounds normal. No respiratory distress. She exhibits no tenderness.   Abdominal: She exhibits no distension. There is tenderness. There is guarding. There is no rebound.   Moderate tenderness with guarding over the RLQ with   exam x 3   Musculoskeletal: She exhibits no edema or tenderness.   Neurological: She is alert and oriented to person, place, and time. She exhibits normal muscle tone.   Skin: Skin is warm and dry. No rash noted. No erythema. No pallor.   Psychiatric: She has a normal mood and affect. Her behavior is normal. Judgment and thought content normal.   Nursing note and vitals reviewed.    MDM and ED Course     ED Medication Orders     Start Ordered     Status Ordering Provider    07/24/15 1643 07/24/15 1642  0.9%  NaCl infusion   Continuous     Route: Intravenous     Last MAR action:  Stopped Terri Skains    07/24/15 1643 07/24/15 1642  morphine injection 4 mg   Once     Route: Intravenous  Ordered Dose: 4 mg     Last MAR action:  Given Terri Skains    07/24/15 1643 07/24/15 1642  promethazine (PHENERGAN) injection 12.5 mg   Once     Route: Intravenous  Ordered Dose: 12.5 mg     Last MAR action:   Given Montay Vanvoorhis, Jacelyn Pi        I believe that the patient requires further imaging at an OB facility    Case discussed with Dr Imogene Burn at the [facility] -ED - he accepts the patient as a transfer    The patient lives in [place] and wants her car with her; she insists on driving to [facility]    MDM  Number of Diagnoses or Management Options  Abdominal pain complicating pregnancy, antepartum:   Right lower quadrant abdominal pain:      Amount and/or Complexity of Data Reviewed  Clinical lab tests: ordered and reviewed    Risk of Complications, Morbidity, and/or Mortality  Presenting problems: moderate  Diagnostic procedures: low  Management options: moderate    Patient Progress  Patient progress: stable      Procedures    Clinical Impression & Disposition     Clinical Impression  Final diagnoses:   Abdominal pain complicating pregnancy, antepartum   Right lower quadrant abdominal pain     ED Disposition     Transfer to [facility] ED          New Prescriptions    No medications on file                 Terri Skains, MD  07/26/15 [phone]

## 2015-07-25 NOTE — Discharge Instructions (Signed)
Follow up with your OB physician, Dr. Christell Constant, in 1-2 days    Abdominal Pain in Pregnancy     You have been seen for abdominal (belly) pain during pregnancy.    People often have abdominal (belly) pain during pregnancy. Most of the time this kind of pain is harmless. Sometimes, however, it can be a sign of a more serious problem.    Abdominal pain can be caused by some serious problems. These problems can include:     A pregnancy that is in the wrong place, like in your tubes instead of in the uterus.   A miscarriage (loss of the fetus).   Problems with your placenta (organ that feeds the fetus).   Early labor (preterm labor).   Bladder infections.   Appendicitis.    Your doctor today does not feel that you have any of the more serious problems. He or she believes it is safe for you to go home.     We don't believe your condition is dangerous right now. However, you need to be careful. Sometimes a problem that seems minor can become serious later. Therefore, it is very important for you to come back here or go to the nearest Emergency Department if you don t get better or your symptoms get worse. Close follow-up with your OB doctor is important.    Some treatments you can try at home are:     [facility] of rest.   Use stool (poop) softeners.   Take over-the-counter pain medications your OB doctor recommends.   Use a heating pad or take a warm bath.    YOU SHOULD SEEK MEDICAL ATTENTION IMMEDIATELY, EITHER HERE OR AT THE NEAREST EMERGENCY DEPARTMENT, IF ANY OF THE FOLLOWING OCCUR:     You have a fever (temperature higher than 100.40F or 38C).   You throw up often or repeatedly or if you throw up blood.   You have blood in your stool. Blood might be bright red or dark red. It can also be black and look like tar.   You have severe pain in your belly that does not get better with medications.   You have pain that stays in the right lower area of your belly only.   You feel like you are going to pass  out.   You have vaginal bleeding or more discharge.    If you can t follow up with your doctor, or if at any time you feel you need to be rechecked or seen again, come back here or go to the nearest emergency department.

## 2016-04-07 ENCOUNTER — Emergency Department
Admission: EM | Admit: 2016-04-07 | Discharge: 2016-04-07 | Disposition: A | Discharge status: Home / Self Care | Payer: Medicaid HMO | Attending: Emergency Medicine | Admitting: Emergency Medicine

## 2016-04-07 ENCOUNTER — Emergency Department: Payer: Medicaid HMO

## 2016-04-07 DIAGNOSIS — Z79899 Other long term (current) drug therapy: Secondary | ICD-10-CM | POA: Insufficient documentation

## 2016-04-07 DIAGNOSIS — G43909 Migraine, unspecified, not intractable, without status migrainosus: Secondary | ICD-10-CM | POA: Insufficient documentation

## 2016-04-07 DIAGNOSIS — N644 Mastodynia: Secondary | ICD-10-CM

## 2016-04-07 DIAGNOSIS — F17210 Nicotine dependence, cigarettes, uncomplicated: Secondary | ICD-10-CM | POA: Insufficient documentation

## 2016-04-07 MED ORDER — BUTALBITAL-APAP-CAFFEINE 50-325-40 MG PO TABS
1.0000 | ORAL_TABLET | ORAL | Status: AC | PRN
Start: 2016-04-07 — End: ?

## 2016-04-07 MED ORDER — FLUCONAZOLE 100 MG PO TABS
150.0000 mg | ORAL_TABLET | Freq: Once | ORAL | Status: AC
Start: 2016-04-07 — End: 2016-04-07
  Administered 2016-04-07: 150 mg via ORAL
  Filled 2016-04-07: qty 2

## 2016-04-07 NOTE — ED Provider Notes (Signed)
EMERGENCY DEPARTMENT HISTORY AND PHYSICAL EXAM    Date: 04/07/2016  Patient Name: Wendy Ortiz  Attending Physician:  Kelly Splinter, MD  Diagnosis and Treatment Plan       Clinical Impression:   1. Pain of both breasts with reduction in breast milk.    2. Migraine without status migrainosus, not intractable, unspecified migraine type        Treatment Plan:   ED Disposition     Discharge Lewanda Perea Bowen discharge to home/self care.    Condition at disposition: Stable            History of Presenting Illness     Chief Complaint   Patient presents with   . Breast Pain   . Migraine       History Provided By: Patient  Chief Complaint: Bilateral Breast Pain    Additional History: Wendy Ortiz is a 25 y.o. female who presents to the ED with shooting intermittent bilateral breast pain since one week ago with associated rash around the left breast and a stabbing headache. Pt states that her son was recently diagnosed with thrush and has not restarted breast feeding. She last breast fed 3 weeks ago. She is still pumping but notes that it has been difficult to get 2 oz of milk out for the past 1.5 weeks. Pt uses [employer] purely yours breast pump. LNMP May 2016. The headache started today, she reports no relief with Tylenol. Pt denies fever.     PCP: Pcp, Noneorunknown, MD    No current facility-administered medications for this encounter.    Current outpatient prescriptions:   .  Prenatal Vit-Fe Fumarate-FA (PRENATAL PO), Take by mouth., Disp: , Rfl:   .  butalbital-acetaminophen-caffeine (FIORICET, ESGIC) 50-325-40 MG per tablet, Take 1 tablet by mouth every 4 (four) hours as needed for Headaches., Disp: 15 tablet, Rfl: 0  .  ibuprofen (ADVIL,MOTRIN) 600 MG tablet, Take 1 tablet (600 mg total) by mouth every 6 (six) hours as needed for Pain., Disp: 60 tablet, Rfl: 1    Past Medical History     Past Medical History   Diagnosis Date   . Asthma    . Migraines      History reviewed. No pertinent past surgical  history.    Family History     History reviewed. No pertinent family history.    Social History     Social History     Social History   . Marital Status: Single     Spouse Name: N/A   . Number of Children: N/A   . Years of Education: N/A     Social History Main Topics   . Smoking status: Heavy Tobacco Smoker -- 0.50 packs/day     Types: Cigarettes   . Smokeless tobacco: Not on file   . Alcohol Use: Yes      Comment: socially    . Drug Use: No   . Sexual Activity: Not on file     Other Topics Concern   . Not on file     Social History Narrative       Allergies     Allergies   Allergen Reactions   . Penicillins Anaphylaxis             Review of Systems     Review of Systems   Constitutional: Negative for fever.   Musculoskeletal:        + Bilateral breast pain   Skin: Positive for rash (Left Breast).  Allergic/Immunologic:        Allergic to Penicillin   Neurological: Positive for headaches.   Psychiatric/Behavioral: Negative for suicidal ideas.   Patient asked and all other systems reviewed and are negative.    Physical Exam     BP 136/83 mmHg  Pulse 65  Temp(Src) 98.4 F (36.9 C) (Tympanic)  Resp 18  Ht 5\' 6"  (1.676 m)  Wt 61.689 kg  BMI 21.96 kg/m2  SpO2 99%  Breastfeeding? Unknown  Pulse Oximetry Analysis - Normal 99% On RA    Physical Exam   Constitutional: She is oriented to person, place, and time. She appears well-developed and well-nourished.   HENT:   Head: Normocephalic and atraumatic.   mmm   Eyes: Conjunctivae and EOM are normal. No scleral icterus.   Neck: Normal range of motion. Neck supple.   Cardiovascular: Normal rate, regular rhythm and intact distal pulses.    Pulmonary/Chest: Effort normal. No respiratory distress.   Abdominal: Soft. She exhibits no distension.   Musculoskeletal: Normal range of motion. She exhibits no edema.   Neurological: She is alert and oriented to person, place, and time. She exhibits normal muscle tone. Coordination normal.   Symmetric facial appearance and  movement   Skin: Skin is warm and dry.   Psychiatric: She has a normal mood and affect. Her behavior is normal. Judgment and thought content normal.   Nursing note and vitals reviewed.    Diagnostic Study Results     Labs -     Results     ** No results found for the last 24 hours. **          Radiologic Studies -   Radiology Results (24 Hour)     ** No results found for the last 24 hours. **      .    Doctor's Notes     Throughout the stay in the Emergency Department, questions and concerns surrounding pain control, care plans, diagnostic studies, effects of medications administered or prescribed, and future prognostic dilemmas were assessed and addressed.    ROS addendum: The patient and/or family was asked if they had any other complaints or concerns that we could address today and nothing of significance was noted.     VITALS:  Patient Vitals for the past 12 hrs:   BP Temp Pulse Resp   04/07/16 2205 136/83 mmHg 98.4 F (36.9 C) 65 18     OLD RECORDS: prior epic encounters    IMP & PLAN: decreased breastmilk output since her child has stopped breast feeding (using pump) with associated soreness, no signs bacterial rash but child did have thrush so we will give diflucan x1; also complaining of migraine with minimal relief with 500mg  tylenol, denies atypical sxs, has photophobia, we will treat with fioricet (rx) and patient advised to return for any unusual sxs/changes  Will refer her to lactation specialist for her to talk with them about mild production and use of pump (through peds or ob/gyn). She can try expressing milk in a warm shower after using a pump or having her child restart breastfeeding to see if this helps with pain/improves milk production.    ED COURSE:     10:17 PM - Pt is agreeable with ED plan. Recommended OB/GYN and lactation follow up. Will prescribe Fioricet for headache. Rx use and side effects, results, home self care, discharge instructions, and return precautions discussed extensively  with patient. Possibility of evolving illness reviewed. All questions solicited and addressed. Patient  is amenable to discharge.      _______________________________  Medical DeMedical Decision Makingcision Making  Attestations:     Physician/Midlevel provider first contact with patient: 04/07/16 2214         This note is prepared by Annia Belt, acting as Scribe for Kelly Splinter, MD.    Kelly Splinter, MD:  The scribe's documentation has been prepared under my direction and personally reviewed by me in its entirety.  I confirm that the note above accurately reflects all work, treatment, procedures, and medical decision making performed by me.    I am the first provider for this patient.    Kelly Splinter, MD is the primary emergency doctor of record.    I reviewed the vital signs, available nursing notes, past medical history, past surgical history, family history and social history.    _______________________________              Westley Foots, MD  04/08/16 [phone]

## 2016-04-07 NOTE — Discharge Instructions (Signed)
AFTER PUMPING,TRY SHOWERING AND MASSAGING YOU BREASTS TO ENCOURAGE CONTINUED MILK PRODUCTION. PLEASE FOLLOW UP WITH YOUR PEDIATRICIAN OR OB/GYN FOR A LACTATION CONSULT WITH YOUR BREAST PUMP. RETURN FOR ANY UNUSUAL HEADACHE FEATURES, BLOODY DISCHARGE, OR PAIN IN A PARTICULAR ARE OF YOUR BREASTS.       Headache, Migraine    You have been diagnosed with a recurrent migraine headache.    Headache is a very common complaint. Most headaches are not dangerous. Your symptoms today sound like your usual headache. It is OK for you to go home. It is important, however, to follow up with your doctor or neurologist.    Migraines are treated with medication to reduce pain. Medications specifically designed for migraine headaches are usually more effective than other pain medications. Ask your doctor about migraine medicines.    Take your medication as directed. This is especially important if your doctor has placed you on a daily medication to prevent headaches.    YOU SHOULD SEEK MEDICAL ATTENTION IMMEDIATELY, EITHER HERE OR AT THE NEAREST EMERGENCY DEPARTMENT, IF ANY OF THE FOLLOWING OCCURS:   Your headache gets worse or does not improve with medication.   You have head pain that is different from your normal headache.   You have a very severe headache that begins suddenly (like an explosion in your head or like a thunderclap).   You have a fever (temperature higher than 100.84F / 38C).   You have loss of feeling or tingling in your arms or legs.   You pass out.   You develop vision problems.   You vomit and cannot take medication or keep medication down.      Mastodynia    You have been diagnosed with mastodynia.     Mastodynia is pain in the breasts. It is common, and it has many causes. Some of the causes are cyclical. This means that they follow a pattern, most often related to your menstrual cycle (period). Other causes are noncyclical. This means that they don't follow a pattern. Mastodynia is different  than mastitis. Mastitis happens when the milk ducts of the breast get blocked, which happens in women who are breastfeeding.     Most of the time cyclical breast pain is normal. It most often happens in the week or days before your period. It usually goes away when your period starts. This pain is caused by ovulation. This is because ovulation causes your breast tissue to grow. Birth control pills and hormone therapy can also cause pain in the breasts. You will most often feel pain in both breasts. The pain is strongest at the top of the breast and the place where the breasts connect to your chest.    Noncyclical breast pain has many causes.     The hormone changes of puberty, pregnancy, and menopause can cause pain in the breasts. However, you will most often feel pain on only one side of the breast. The pain might be constant or it might come and go.      Breast size is also important. Large breasts stretch the connective tissue that support the breast (called Cooper s ligaments), which can also cause pain.        Other causes of breast pain are tumors and infections such as abscess. (An abscess is an area of infected fluid that forms under your skin.) However, with an infection, your skin will most often be red or blistered, or show some other change. Most often, breast cancer is not painful.  Your breast pain might be caused by something other than your breasts. For example, you might have pain in the muscles close to the breasts. You might also have a problem with the nerves of your spine. These things make it seem like your breast is in pain.      Also, breast pain is connected to a medical problem called ductal ectasia. With ductal ectasia, you have a fever along with swelling in your milk ducts.    Most often, the cause of your breast pain can be found through your medical history and a physical exam. You might have more tests done if your doctor thinks your pain has a dangerous cause such  as cancer. For example, you might have an ultrasound, a CAT scan, or an MRI.    Treatment usually depends on the cause of the pain. Anti-inflammatory medications are helpful. If your pain is very serious, though, you might get a medicine like tamoxifen (Nolvadex, Soltamox) or danazol (Danocrine).     Follow up with your primary care doctor or OB-GYN (women's doctor) for a breast exam every year. You should also follow up if the pain is still there.     If you have large breasts, wear a bra with good support.     Your doctor might have you take acetaminophen or an anti-inflammatory medicine such as ibuprofen. Your doctor might also give you an anti-inflammatory medicine that can be rubbed into your skin. Use the medicine as directed.     Don t drive or operate heavy machinery if you are given narcotic (opiate) pain medicine.    Though we don't believe your condition is serious right now, it is important to be careful. Sometimes a problem that seems mild can become serious later. This is why it is very important that you return here or go to the nearest Emergency Department if you are not improving or your symptoms are getting worse.    YOU SHOULD SEEK MEDICAL ATTENTION IMMEDIATELY, EITHER HERE OR AT THE NEAREST EMERGENCY DEPARTMENT, IF ANY OF THE FOLLOWING OCCUR:     Your breast pain gets worse. Something unusual comes out of your breasts.    You get a fever (temperature higher than 100.77F or 38C) or chills.    You have uncontrollable nausea (feeling sick to your stomach). You have uncontrollable vomiting (throwing up).     If you can t follow up with your doctor, or if at any time you feel you need to be rechecked or seen again, come back here or go to the nearest emergency department.

## 2016-04-24 ENCOUNTER — Emergency Department
Admission: EM | Admit: 2016-04-24 | Discharge: 2016-04-25 | Disposition: A | Discharge status: Home / Self Care | Payer: Medicaid HMO | Attending: Emergency Medicine | Admitting: Emergency Medicine

## 2016-04-24 ENCOUNTER — Emergency Department: Payer: Medicaid HMO

## 2016-04-24 DIAGNOSIS — F17210 Nicotine dependence, cigarettes, uncomplicated: Secondary | ICD-10-CM | POA: Insufficient documentation

## 2016-04-24 DIAGNOSIS — S93409A Sprain of unspecified ligament of unspecified ankle, initial encounter: Secondary | ICD-10-CM

## 2016-04-24 DIAGNOSIS — R200 Anesthesia of skin: Secondary | ICD-10-CM | POA: Insufficient documentation

## 2016-04-24 DIAGNOSIS — W11XXXA Fall on and from ladder, initial encounter: Secondary | ICD-10-CM | POA: Insufficient documentation

## 2016-04-24 DIAGNOSIS — Z880 Allergy status to penicillin: Secondary | ICD-10-CM | POA: Insufficient documentation

## 2016-04-24 DIAGNOSIS — S93402A Sprain of unspecified ligament of left ankle, initial encounter: Secondary | ICD-10-CM | POA: Insufficient documentation

## 2016-04-24 MED ORDER — IBUPROFEN 600 MG PO TABS
600.0000 mg | ORAL_TABLET | Freq: Once | ORAL | Status: AC
Start: 2016-04-24 — End: 2016-04-24
  Administered 2016-04-24: 600 mg via ORAL
  Filled 2016-04-24: qty 1

## 2016-04-24 MED ORDER — IBUPROFEN 400 MG PO TABS
400.0000 mg | ORAL_TABLET | Freq: Four times a day (QID) | ORAL | Status: AC | PRN
Start: 2016-04-24 — End: ?

## 2016-04-24 NOTE — ED Provider Notes (Signed)
Physician/Midlevel provider first contact with patient: 04/24/16 2214         EMERGENCY DEPARTMENT HISTORY AND PHYSICAL EXAM    Patient Name: Wendy Ortiz, Wendy Ortiz  Encounter Date:  04/24/2016  Attending Physician: Elray Mcgregor, MD  Patient DOB:  05/22/1991  MRN:  [id number]  Room:  23/B23    History of Presenting Illness     Historian: Patient    25 y.o. female o/w healthy p/w persistent, moderate-severe left ankle pain which began after falling from a ladder three hours PTA. The pain worsens with ambulation. Associated with numbness.     Nursing (triage) note reviewed for the following pertinent information:  pt fell off ladder; about 5-6 steps from ground. pt was not dizzy when she fell, denies dizziness now. 7/10 pain to left ankle.       PMD:  Pcp, Noneorunknown, MD    Nursing Notes Review  Nursing Notes were reviewed.     Previous Records Review  Previous records were reviewed to the extent practicable for the current presentation.    Past Medical History     Past Medical History   Diagnosis Date   . Asthma    . Migraines        No current facility-administered medications for this encounter.    Current outpatient prescriptions:   .  butalbital-acetaminophen-caffeine (FIORICET, ESGIC) 50-325-40 MG per tablet, Take 1 tablet by mouth every 4 (four) hours as needed for Headaches., Disp: 15 tablet, Rfl: 0  .  ibuprofen (ADVIL,MOTRIN) 400 MG tablet, Take 1 tablet (400 mg total) by mouth every 6 (six) hours as needed for Pain., Disp: 12 tablet, Rfl: 0  .  Prenatal Vit-Fe Fumarate-FA (PRENATAL PO), Take by mouth., Disp: , Rfl:     Allergies   Allergen Reactions   . Penicillins Anaphylaxis             Medical history, medications, and allergies reviewed.     Past Surgical History     History reviewed. No pertinent past surgical history.    Family History   The family history is not significantly contributory to current presentation.   No family history on file.    Social History   Social history is not significantly  contributory to the patient's current presentation.   Social History     Social History   . Marital Status: Single     Spouse Name: N/A   . Number of Children: N/A   . Years of Education: N/A     Social History Main Topics   . Smoking status: Heavy Tobacco Smoker -- 0.50 packs/day     Types: Cigarettes   . Smokeless tobacco: Not on file   . Alcohol Use: Yes      Comment: socially    . Drug Use: No   . Sexual Activity: Not on file     Other Topics Concern   . Not on file     Social History Narrative       Home Medications     Home medications reviewed by ED MD     Discharge Medication List as of 04/24/2016 11:17 PM      CONTINUE these medications which have NOT CHANGED    Details   butalbital-acetaminophen-caffeine (FIORICET, ESGIC) 50-325-40 MG per tablet Take 1 tablet by mouth every 4 (four) hours as needed for Headaches., Starting 04/07/2016, Until Discontinued, Print      Prenatal Vit-Fe Fumarate-FA (PRENATAL PO) Take by mouth., Until Discontinued, Historical Med  Review of Systems     See HPI for review of systems that is relevant to the current presentation.   All other systems reviewed: negative.     Physical Exam     Vital Signs  BP 115/60 mmHg  Pulse 82  Temp(Src) 98.3 F (36.8 C) (Oral)  Resp 18  Ht 5\' 6"  (1.676 m)  Wt 64.411 kg  BMI 22.93 kg/m2  SpO2 100%  LMP 03/11/2016 (Exact Date)    Review of Vital Signs  The patient's vital signs and oxygen saturation were reviewed and interpreted by me, Elray Mcgregor, MD.    Physical Exam    Constitutional:  Well developed. Well nourished.   HENT: Normocephalic. Moist mucous membranes.   Eyes: No conjunctival discharge. EOMI. Pupils reactive.   Neck: Supple, full range of motion. No tracheal deviation.   Cardiovascular: Normal capillary refill.   Pulmonary/Chest: Normal respiratory effort.   Abdominal:  No distention.   Musculoskeletal: Minimal tenderness at the ATFL. No swelling. Good range of motion.   Neurological: Alert, normal mental  status. No acute focal deficits.   Skin: Warm skin. Not ashen.   Psychiatric: Normal affect. Normal behavior.     ED Medications Administered     ED Medication Orders     Start Ordered     Status Ordering Provider    04/24/16 2222 04/24/16 2221  ibuprofen (ADVIL,MOTRIN) tablet 600 mg   Once     Route: Oral  Ordered Dose: 600 mg     Last MAR action:  Given Brihany Butch N          Orders Placed During This Encounter     Orders Placed This Encounter   Procedures   . XR Ankle Left 3+ Views   . Foot Left AP Lateral and Oblique   . Education: Walking With Ambulatory Aid   . Crutches-Provide and Teach   . Apply ace wrap       Diagnostic Study Results     The results of the diagnostic studies below were reviewed by the ED provider:    Labs  Results     ** No results found for the last 24 hours. **          Radiologic Studies  Radiology Results (24 Hour)     Procedure Component Value Units Date/Time    XR Ankle Left 3+ Views [[id number]] Collected:  04/24/16 2238    Order Status:  Completed Updated:  04/24/16 2244    Narrative:      Injury to the left leg with numbness    Findings no prior.   Left ankle 4 views. No fracture dislocation or joint effusion. No  significant focal swelling. Alignment is anatomic.      Impression:      . No acute bone injury left ankle.    Left foot. 3 views. No acute fracture dislocation. Note focal swelling.  No calcaneal spur. Joint spaces are preserved alignment anatomic. Small  corticated calcification between the first and second toes probably  secondary ossification center    . No acute bone injury left foot    Marty Heck, MD   04/24/2016 10:40 PM      Foot Left AP Lateral and Oblique [[id number]] Collected:  04/24/16 2238    Order Status:  Completed Updated:  04/24/16 2244    Narrative:      Injury to the left leg with numbness    Findings no prior.   Left ankle 4  views. No fracture dislocation or joint effusion. No  significant focal swelling. Alignment is anatomic.       Impression:      . No acute bone injury left ankle.    Left foot. 3 views. No acute fracture dislocation. Note focal swelling.  No calcaneal spur. Joint spaces are preserved alignment anatomic. Small  corticated calcification between the first and second toes probably  secondary ossification center    . No acute bone injury left foot    Marty Heck, MD   04/24/2016 10:40 PM            Scribe and MD Attestations     I, Elray Mcgregor, MD, personally performed the services documented. Orland Jarred is scribing for me on [place],Wendy Ortiz. I reviewed and confirm the accuracy of the information in this medical record.     Lorenda Cahill, am serving as a Neurosurgeon to document services personally performed by Elray Mcgregor, MD, based on the provider's statements to me.     Credentials: Orland Jarred, scribe    Rendering Provider: Elray Mcgregor, MD      MDM and Clinical Notes     Plan, Hx & Exam Synthesis, and Differential Diagnosis     Most likely ankle sprain. Unlikely fracture. No sign of fifth metatarsal injury, navicular injury, or other foot injury. Will check ankle and foot X-Rays and give supportive treatment. If ED course is unremarkable will likely discharge with close follow-up.     ED Course, Monitors, EKG, Critical Care, Splints, Consults, Reevaluation, etc     Return Precautions    I counseled extensively on nature of problem--voiced understanding, agrees to follow up. Given strict return precautions--fully understands:   Pt is to return immediately to emergency department if any worsening symptoms at all--otherwise, return to the emergency department within 2 days for a recheck or follow up with primary physician within 2 days.    Diagnosis and Disposition     Clinical Impression  1. Sprain of ankle, unspecified laterality, unspecified ligament, initial encounter        Disposition  ED Disposition     Discharge Annaliz Aven Marich discharge to home/self care.    Condition at  disposition: Stable            Prescriptions       Discharge Medication List as of 04/24/2016 11:17 PM                        Elray Mcgregor, MD  04/25/16 1453

## 2016-04-24 NOTE — Discharge Instructions (Signed)
Return immediately to the emergency room if you have any worsening symptoms!!! Otherwise, see general doctor in the next 2 days, or return to the emergency department in 2 days if you have any trouble getting an appointment.     Thank you for choosing [facility] for your emergency care needs.  We strive to provide EXCELLENT care to you and your family.      If you do not continue to improve or your condition worsens, please contact your doctor or return immediately to the Emergency Department.    The examination and treatment you have received in our Emergency Department is provided on an emergency basis, and is not intended to be a substitute for your primary care physician.  It is important that your doctor checks you again and that you report any new or remaining problems at that time.      DOCTOR REFERRALS  Call [phone] (available 24 hours a day, 7 days a week) if you need any further referrals and we can help you find a primary care doctor or specialist.  Also, available online at:  [email or web address]    YOUR CONTACT INFORMATION  Before leaving please check with registration to make sure we have an up-to-date contact number.  You can call registration at [phone] to update your information.  For questions about your hospital bill, please call [phone].  For questions about your Emergency Dept Physician bill please call [phone].      FREE HEALTH SERVICES  If you need help with health or social services, please call 2-1-1 for a free referral to resources in your area.  2-1-1 is a free service connecting people with information on health insurance, free clinics, pregnancy, mental health, dental care, food assistance, housing, and substance abuse counseling.  Also, available online at:  [email or web address]    MEDICAL RECORDS AND TESTS  Certain laboratory test results do not come back the same day, for example urine cultures.   We will contact you if  other important findings are noted.  Radiology films are often reviewed again to ensure accuracy.  If there is any discrepancy, we will notify you.      Please call [phone] to pick up a complimentary CD of any radiology studies performed.  If you or your doctor would like to request a copy of your medical records, please call [phone].      ORTHOPEDIC INJURY   Please know that significant injuries can exist even when an initial x-ray is read as normal or negative.  This can occur because some fractures (broken bones) are not initially visible on x-rays.  For this reason, close outpatient follow-up with your primary care doctor or bone specialist (orthopedist) is required.    MEDICATIONS AND FOLLOWUP  Please be aware that some prescription medications can cause drowsiness.  Use caution when driving or operating machinery.    24 HOUR PHARMACIES  [facility] - [address], [place], [place] [address] (1.4 miles, 7 minutes)  [facility] - [address], [place], [place] [address] (6.5 miles, 13 minutes)  A handout with directions is available on request.

## 2016-04-24 NOTE — ED Notes (Signed)
25 y/o female present to the Ed with a c/o of a left ankle injury. Pt reports she fell off a ladder and "her entire body weight landed on her left ankle". Pt reports pain especially when she walks on it.

## 2016-06-19 ENCOUNTER — Ambulatory Visit (HOSPITAL_COMMUNITY)
Admission: EM | Admit: 2016-06-19 | Discharge: 2016-06-19 | Disposition: A | Discharge status: Home / Self Care | Payer: Self-pay | Attending: Emergency Medicine | Admitting: Emergency Medicine

## 2016-06-19 ENCOUNTER — Encounter (HOSPITAL_COMMUNITY): Payer: Self-pay | Admitting: Family Medicine

## 2016-06-19 DIAGNOSIS — G43909 Migraine, unspecified, not intractable, without status migrainosus: Secondary | ICD-10-CM

## 2016-06-19 HISTORY — DX: Migraine, unspecified, not intractable, without status migrainosus: G43.909

## 2016-06-19 MED ORDER — DIPHENHYDRAMINE HCL 50 MG/ML IJ SOLN
INTRAMUSCULAR | Status: AC
  Filled 2016-06-19: qty 1

## 2016-06-19 MED ORDER — KETOROLAC TROMETHAMINE 60 MG/2ML IM SOLN
INTRAMUSCULAR | Status: AC
  Filled 2016-06-19: qty 2

## 2016-06-19 MED ORDER — DEXAMETHASONE SODIUM PHOSPHATE 10 MG/ML IJ SOLN
10.0000 mg | Freq: Once | INTRAMUSCULAR | Status: AC
  Administered 2016-06-19: 10 mg via INTRAMUSCULAR

## 2016-06-19 MED ORDER — DIPHENHYDRAMINE HCL 50 MG/ML IJ SOLN
50.0000 mg | Freq: Once | INTRAMUSCULAR | Status: AC
  Administered 2016-06-19: 50 mg via INTRAMUSCULAR

## 2016-06-19 MED ORDER — ONDANSETRON 4 MG PO TBDP
4.0000 mg | ORAL_TABLET | Freq: Once | ORAL | Status: AC
  Administered 2016-06-19: 4 mg via ORAL

## 2016-06-19 MED ORDER — KETOROLAC TROMETHAMINE 60 MG/2ML IM SOLN
60.0000 mg | Freq: Once | INTRAMUSCULAR | Status: AC
  Administered 2016-06-19: 60 mg via INTRAMUSCULAR

## 2016-06-19 MED ORDER — DEXAMETHASONE 10 MG/ML FOR PEDIATRIC ORAL USE
INTRAMUSCULAR | Status: AC
  Filled 2016-06-19: qty 1

## 2016-06-19 MED ORDER — ONDANSETRON 4 MG PO TBDP
ORAL_TABLET | ORAL | Status: AC
  Filled 2016-06-19: qty 1

## 2016-06-19 NOTE — ED Triage Notes (Signed)
Pt here for migraine x 1 week. sts OTC meds not working.

## 2016-06-19 NOTE — ED Provider Notes (Signed)
CSN: [id number]     Arrival date & time 06/19/16  1927 History   First MD Initiated Contact with Patient 06/19/16 2031     Chief Complaint  Patient presents with  . Migraine   (Consider location/radiation/quality/duration/timing/severity/associated sxs/prior Treatment) 25 year old female who is recently moved [place] this [place] presents with a migraine headache for one and half weeks. She states this headache is similar as one before. The headache is bitemporal is associated with nausea but no vomiting. She has photophobia as well. Denies problems with vision, speech, hearing, swallowing. She states that she has had migraine headaches for several years and it is usually relieved with hydromorphone. LMP 1 week ago.        Past Medical History:  Diagnosis Date  . Migraine    History reviewed. No pertinent surgical history. History reviewed. No pertinent family history. Social History  Substance Use Topics  . Smoking status: Never Smoker  . Smokeless tobacco: Never Used  . Alcohol use Not on file   OB History    No data available     Review of Systems  Constitutional: Positive for activity change. Negative for fever.  Eyes: Positive for photophobia. Negative for visual disturbance.  Respiratory: Negative.   Cardiovascular: Negative.   Musculoskeletal: Negative.   Skin: Negative.   Neurological: Positive for headaches. Negative for dizziness, tremors, seizures, syncope, facial asymmetry, speech difficulty, weakness, light-headedness and numbness.  Psychiatric/Behavioral: Negative.   All other systems reviewed and are negative.   Allergies  Penicillins  Home Medications   Prior to Admission medications   Not on File   Meds Ordered and Administered this Visit   Medications  ketorolac (TORADOL) injection 60 mg (not administered)  dexamethasone (DECADRON) injection 10 mg (not administered)  diphenhydrAMINE (BENADRYL) injection 50 mg (not administered)   ondansetron (ZOFRAN-ODT) disintegrating tablet 4 mg (not administered)    BP 135/78   Pulse 67   Temp 98.1 F (36.7 C)   Resp 18   SpO2 100%  No data found.   Physical Exam  Constitutional: She is oriented to person, place, and time. She appears well-developed and well-nourished. No distress.  HENT:  Head: Normocephalic and atraumatic.  Right Ear: External ear normal.  Left Ear: External ear normal.  Mouth/Throat: Oropharynx is clear and moist. No oropharyngeal exudate.  Eyes: Conjunctivae and EOM are normal. Pupils are equal, round, and reactive to light. Right eye exhibits no discharge. Left eye exhibits no discharge.  Neck: Normal range of motion. Neck supple.  Cardiovascular: Normal rate, regular rhythm, normal heart sounds and intact distal pulses.   Pulmonary/Chest: Effort normal and breath sounds normal. No respiratory distress.  Abdominal: Soft. There is no tenderness.  Musculoskeletal: Normal range of motion. She exhibits no edema or tenderness.  Lymphadenopathy:    She has no cervical adenopathy.  Neurological: She is alert and oriented to person, place, and time. She has normal strength. She displays no tremor. No cranial nerve deficit or sensory deficit. She exhibits normal muscle tone. Coordination and gait normal. GCS eye subscore is 4. GCS verbal subscore is 5. GCS motor subscore is 6.  Finger to nose nl  Skin: Skin is warm and dry. No rash noted.  Psychiatric: She has a normal mood and affect.  Nursing note and vitals reviewed.   Urgent Care Course   Clinical Course    Procedures (including critical care time)  Labs Review Labs Reviewed - No data to display  Imaging Review No results found.  Visual Acuity Review  Right Eye Distance:   Left Eye Distance:   Bilateral Distance:    Right Eye Near:   Left Eye Near:    Bilateral Near:         MDM   1. Migraine without status migrainosus, not intractable, unspecified migraine type    Meds  ordered this encounter  Medications  . ketorolac (TORADOL) injection 60 mg  . dexamethasone (DECADRON) injection 10 mg  . diphenhydrAMINE (BENADRYL) injection 50 mg  . ondansetron (ZOFRAN-ODT) disintegrating tablet 4 mg   Must seek PCP, call numbers in instructions . Go to the ED if worse or new symptoms.    Hayden Rasmussen, NP 06/19/16 2044

## 2016-07-26 ENCOUNTER — Encounter (HOSPITAL_COMMUNITY): Payer: Self-pay

## 2016-07-26 DIAGNOSIS — G43909 Migraine, unspecified, not intractable, without status migrainosus: Secondary | ICD-10-CM | POA: Insufficient documentation

## 2016-07-26 NOTE — ED Triage Notes (Signed)
Pt complains of a migraine for three days behind her eyes. Today she states that she went to the bathroom and there was blood in the toilet, bright red

## 2016-07-27 ENCOUNTER — Emergency Department (HOSPITAL_COMMUNITY)
Admission: EM | Admit: 2016-07-27 | Discharge: 2016-07-27 | Disposition: A | Discharge status: Home / Self Care | Payer: Self-pay | Attending: Emergency Medicine | Admitting: Emergency Medicine

## 2016-07-27 DIAGNOSIS — G43909 Migraine, unspecified, not intractable, without status migrainosus: Secondary | ICD-10-CM

## 2016-07-27 LAB — RAPID STREP SCREEN (MED CTR MEBANE ONLY): STREPTOCOCCUS, GROUP A SCREEN (DIRECT): NEGATIVE

## 2016-07-27 MED ORDER — TRAMADOL HCL 50 MG PO TABS: 50.0000 mg | ORAL_TABLET | Freq: Four times a day (QID) | ORAL | 0 refills | Status: DC | PRN

## 2016-07-27 MED ORDER — OXYCODONE-ACETAMINOPHEN 5-325 MG PO TABS
1.0000 | ORAL_TABLET | Freq: Once | ORAL | Status: AC
  Administered 2016-07-27: 1 via ORAL
  Filled 2016-07-27: qty 1

## 2016-07-27 MED ORDER — KETOROLAC TROMETHAMINE 60 MG/2ML IM SOLN
60.0000 mg | Freq: Once | INTRAMUSCULAR | Status: AC
  Administered 2016-07-27: 60 mg via INTRAMUSCULAR
  Filled 2016-07-27: qty 2

## 2016-07-27 MED ORDER — DIPHENHYDRAMINE HCL 25 MG PO CAPS
25.0000 mg | ORAL_CAPSULE | Freq: Once | ORAL | Status: AC
  Administered 2016-07-27: 25 mg via ORAL
  Filled 2016-07-27: qty 1

## 2016-07-27 MED ORDER — METOCLOPRAMIDE HCL 10 MG PO TABS
10.0000 mg | ORAL_TABLET | Freq: Once | ORAL | Status: AC
  Administered 2016-07-27: 10 mg via ORAL
  Filled 2016-07-27: qty 1

## 2016-07-27 NOTE — ED Notes (Signed)
Pt decided not to be seen for rectal bleeding.

## 2016-07-27 NOTE — ED Provider Notes (Signed)
[facility] Provider Note   CSN: [id number] Arrival date & time: 07/26/16  2121   By signing my name below, I, Arianna Nassar and Clarisse Gouge, attest that this documentation has been prepared under the direction and in the presence of Athenia Rys Green, PA-C.  Electronically Signed: Octavia Heir, ED Scribe. 07/27/16. 1:09 AM.   History   Chief Complaint Chief Complaint  Patient presents with  . Migraine  . Rectal Bleeding    The history is provided by the patient. No language interpreter was used.   HPI Comments: Tabitha Smith is a 25 y.o. female brought in by a family member who presents to the Emergency Department complaining of constant, gradual worsening migraine x 2 days. The pain is described as sharp and pressure-like behind her eyes. Associated left ear pain and sore throat x 1 day. Pt currently lives with 6 children at home. Pt has been around sick contacts at home. She notes two children in her household were diagnosed with strep. The pt denies history of rectal bleeding, diaphoresis, or fever.She mentioned in triage rectal bleeding but was told by staff she would need a rectal exam. Patient says that she does NOT want to be evaluated for rectal bleeding if this is true. After a discussion, the patient has decided she would prefer to see her PCP regarding what sounds like an episode of small passing of blood with straining to stool x 1.   Past Medical History:  Diagnosis Date  . Migraine     There are no active problems to display for this patient.   History reviewed. No pertinent surgical history.  OB History    No data available       Home Medications    Prior to Admission medications   Medication Sig Start Date End Date Taking? Authorizing Provider  traMADol (ULTRAM) 50 MG tablet Take 1 tablet (50 mg total) by mouth every 6 (six) hours as needed. 07/27/16   Marlon Pel, PA-C    Family History History reviewed. No pertinent family  history.  Social History Social History  Substance Use Topics  . Smoking status: Never Smoker  . Smokeless tobacco: Never Used  . Alcohol use No     Allergies   Penicillins   Review of Systems Review of Systems   A complete 10 system review of systems was obtained and all systems are negative except as noted in the HPI and PMH.    Physical Exam Updated Vital Signs BP 115/71 (BP Location: Right Arm)   Pulse 67   Temp 98.8 F (37.1 C) (Oral)   Resp 18   Ht 5\' 5"  (1.651 m)   Wt 62.6 kg   LMP 07/15/2016   SpO2 100%   BMI 22.96 kg/m   Physical Exam  Constitutional: She is oriented to person, place, and time. She appears well-developed and well-nourished. No distress.  The patient is well appearing  HENT:  Head: Normocephalic and atraumatic.  Right Ear: Tympanic membrane, external ear and ear canal normal.  Left Ear: Tympanic membrane, external ear and ear canal normal.  Nose: Nose normal. No rhinorrhea. Right sinus exhibits no maxillary sinus tenderness and no frontal sinus tenderness. Left sinus exhibits no maxillary sinus tenderness and no frontal sinus tenderness.  Mouth/Throat: Uvula is midline and mucous membranes are normal. No trismus in the jaw. Normal dentition. No dental abscesses or uvula swelling. Oropharyngeal exudate and posterior oropharyngeal edema present. No posterior oropharyngeal erythema or tonsillar abscesses.  No submental edema, tongue  not elevated, no trismus. No impending airway obstruction; Pt able to speak full sentences, swallow intact, no drooling, stridor, or tonsillar/uvula displacement. No palatal petechia  Eyes: Conjunctivae and EOM are normal.  Neck: Trachea normal, normal range of motion and full passive range of motion without pain. Neck supple. No neck rigidity. Normal range of motion present. No Brudzinski's sign noted.  Flexion and extension of neck without pain or difficulty. Able to breath without difficulty in extension.   Cardiovascular: Normal rate and regular rhythm.   Pulmonary/Chest: Effort normal and breath sounds normal. No stridor. No respiratory distress. She has no wheezes.  Abdominal: Soft. She exhibits no distension. There is no tenderness.  No obvious evidence of splenomegaly. Non ttp.   Musculoskeletal: Normal range of motion.  Lymphadenopathy:       Head (right side): No preauricular and no posterior auricular adenopathy present.       Head (left side): No preauricular and no posterior auricular adenopathy present.    She has cervical adenopathy.  Neurological: She is alert and oriented to person, place, and time.  Skin: Skin is warm and dry. No rash noted. She is not diaphoretic.  Psychiatric: She has a normal mood and affect.  Nursing note and vitals reviewed.    ED Treatments / Results  DIAGNOSTIC STUDIES: Oxygen Saturation is 100% on RA, normal by my interpretation.  COORDINATION OF CARE:  1:10 AM Discussed treatment plan which includes an offered rectal exam and referral to a specialist for the rectal bleeding. Pt accepted the referral  and migraine cocktail.  Labs (all labs ordered are listed, but only abnormal results are displayed) Labs Reviewed  RAPID STREP SCREEN (NOT AT [facility])  CULTURE, GROUP A STREP [facility])    EKG  EKG Interpretation None       Radiology No results found.  Procedures Procedures (including critical care time)  Medications Ordered in ED Medications  ketorolac (TORADOL) injection 60 mg (60 mg Intramuscular Given 07/27/16 0146)  diphenhydrAMINE (BENADRYL) capsule 25 mg (25 mg Oral Given 07/27/16 0147)  metoCLOPramide (REGLAN) tablet 10 mg (10 mg Oral Given 07/27/16 0147)  oxyCODONE-acetaminophen (PERCOCET/ROXICET) 5-325 MG per tablet 1 tablet (1 tablet Oral Given 07/27/16 0318)   Pt with negative strep. Diagnosis of viral pharyngitis. No abx indicated at this time. Discussed that results of strep culture are pending and patient will be informed if  positive result and abx will be called in at that time. Discharge with symptomatic tx. No evidence of dehydration. Pt is tolerating secretions. Presentation not concerning for peritonsillar abscess or spread of infection to deep spaces of the throat; patent airway. Specific return precautions discussed. Recommended PCP follow up. Pt appears safe for discharge.  Initial Impression / Assessment and Plan / ED Course  I have reviewed the triage vital signs and the nursing notes.  Pertinent labs & imaging results that were available during my care of the patient were reviewed by me and considered in my medical decision making (see chart for details).  Clinical Course   I personally performed the services described in this documentation, which was scribed in my presence. The recorded information has been reviewed and is accurate.  Final Clinical Impressions(s) / ED Diagnoses   Final diagnoses:  Migraine without status migrainosus, not intractable, unspecified migraine type    New Prescriptions Discharge Medication List as of 07/27/2016  3:07 AM    START taking these medications   Details  traMADol (ULTRAM) 50 MG tablet Take 1 tablet (50 mg  total) by mouth every 6 (six) hours as needed., Starting Thu 07/27/2016, Print         Marlon Pel, PA-C 08/02/16 1404    Gwyneth Sprout, MD 08/04/16 2352

## 2016-07-29 LAB — CULTURE, GROUP A STREP (THRC)

## 2016-08-03 ENCOUNTER — Encounter (HOSPITAL_COMMUNITY): Payer: Self-pay | Admitting: Emergency Medicine

## 2016-08-03 ENCOUNTER — Ambulatory Visit (HOSPITAL_COMMUNITY)
Admission: EM | Admit: 2016-08-03 | Discharge: 2016-08-03 | Disposition: A | Discharge status: Home / Self Care | Payer: Medicaid Other | Attending: Internal Medicine | Admitting: Internal Medicine

## 2016-08-03 DIAGNOSIS — B372 Candidiasis of skin and nail: Secondary | ICD-10-CM

## 2016-08-03 MED ORDER — FLUCONAZOLE 200 MG PO TABS: 200.0000 mg | ORAL_TABLET | Freq: Every day | ORAL | 0 refills | Status: AC

## 2016-08-03 MED ORDER — MUPIROCIN 2 % EX OINT: 1.0000 "application " | TOPICAL_OINTMENT | Freq: Two times a day (BID) | CUTANEOUS | 0 refills | Status: DC

## 2016-08-03 MED ORDER — CLOTRIMAZOLE 1 % EX CREA: TOPICAL_CREAM | CUTANEOUS | 0 refills | Status: DC

## 2016-08-03 NOTE — ED Provider Notes (Signed)
[facility]    CSN: [id number] Arrival date & time: 08/03/16  1842     History   Chief Complaint Chief Complaint  Patient presents with  . Thrush    HPI Tabitha Smith is a 25 y.o. female. She presents today with difficulty with nipple discomfort during breast-feeding; she and infant are both having some trouble with manifestations of yeast. The infant has thrush. She has nipple discomfort and vaginal symptoms. No fever. No malaise. Using topical nystatin at present, without relief.  HPI  Past Medical History:  Diagnosis Date  . Migraine    History reviewed. No pertinent surgical history.   Home Medications    Prior to Admission medications   Medication Sig Start Date End Date Taking? Authorizing Provider  clotrimazole (LOTRIMIN) 1 % cream Apply to affected area 2 times daily 08/03/16   Eustace Moore, MD  fluconazole (DIFLUCAN) 200 MG tablet Take 1 tablet (200 mg total) by mouth daily. 08/03/16 08/17/16  Eustace Moore, MD  mupirocin ointment (BACTROBAN) 2 % Apply 1 application topically 2 (two) times daily. To cracked areas of nipples 08/03/16   Eustace Moore, MD  traMADol (ULTRAM) 50 MG tablet Take 1 tablet (50 mg total) by mouth every 6 (six) hours as needed. 07/27/16   Marlon Pel, PA-C    Family History History reviewed. No pertinent family history.  Social History Social History  Substance Use Topics  . Smoking status: Never Smoker  . Smokeless tobacco: Never Used  . Alcohol use No     Allergies   Penicillins   Review of Systems Review of Systems  All other systems reviewed and are negative.    Physical Exam Triage Vital Signs ED Triage Vitals  Enc Vitals Group     BP 08/03/16 1931 125/75     Pulse Rate 08/03/16 1931 61     Resp 08/03/16 1931 12     Temp 08/03/16 1931 99.1 F (37.3 C)     Temp Source 08/03/16 1931 Oral     SpO2 08/03/16 1931 100 %     Weight --      Height --      Pain Score 08/03/16 2023 5   Updated Vital  Signs BP 125/75 (BP Location: Left Arm)   Pulse 61   Temp 99.1 F (37.3 C) (Oral)   Resp 12   LMP 07/15/2016   SpO2 100%  Physical Exam  Constitutional: She is oriented to person, place, and time. No distress.  Alert, nicely groomed  HENT:  Head: Atraumatic.  Eyes:  Conjugate gaze, no eye redness/drainage  Neck: Neck supple.  Cardiovascular: Normal rate.   Pulmonary/Chest: No respiratory distress.  Abdominal: She exhibits no distension.  Musculoskeletal: Normal range of motion.  No leg swelling  Neurological: She is alert and oriented to person, place, and time.  Skin: Skin is warm and dry.  No cyanosis  Nursing note and vitals reviewed.    UC Treatments / Results   Procedures Procedures (including critical care time)      None today  Final Clinical Impressions(s) / UC Diagnoses   Final diagnoses:  Candidal dermatitis   Followup with lactation consultant/pediatrician as needed.  Prescriptions for yeast medicines (clotrimazole and fluconazole) and an antibiotic ointment (mupirocin, for cracked areas on nipples) were sent to the [facility] on [address].  New Prescriptions New Prescriptions   CLOTRIMAZOLE (LOTRIMIN) 1 % CREAM    Apply to affected area 2 times daily   FLUCONAZOLE (DIFLUCAN) 200  MG TABLET    Take 1 tablet (200 mg total) by mouth daily.   MUPIROCIN OINTMENT (BACTROBAN) 2 %    Apply 1 application topically 2 (two) times daily. To cracked areas of nipples     Eustace MooreLaura W Deziya Amero, MD 08/05/16 2157

## 2016-08-03 NOTE — Discharge Instructions (Addendum)
Followup with lactation consultant/pediatrician as needed.  Prescriptions for yeast medicines (clotrimazole and fluconazole) and an antibiotic ointment (mupirocin, for cracked areas on nipples) were sent to the [facility] on [address].

## 2016-08-03 NOTE — ED Triage Notes (Signed)
Pt here b/c she is being treated for thrush of bilateral breast and is taking nystatin cream w/no relief  Also reports 74 month old child is also being treated for thrush  Pt is breast feeding  A&P x4.... NAD

## 2016-08-28 ENCOUNTER — Emergency Department (HOSPITAL_COMMUNITY): Payer: Self-pay

## 2016-08-28 ENCOUNTER — Encounter (HOSPITAL_COMMUNITY): Payer: Self-pay | Admitting: *Deleted

## 2016-08-28 ENCOUNTER — Emergency Department (HOSPITAL_COMMUNITY)
Admission: EM | Admit: 2016-08-28 | Discharge: 2016-08-28 | Disposition: A | Discharge status: Home / Self Care | Payer: Self-pay | Attending: Emergency Medicine | Admitting: Emergency Medicine

## 2016-08-28 DIAGNOSIS — M546 Pain in thoracic spine: Secondary | ICD-10-CM | POA: Insufficient documentation

## 2016-08-28 DIAGNOSIS — F17200 Nicotine dependence, unspecified, uncomplicated: Secondary | ICD-10-CM | POA: Insufficient documentation

## 2016-08-28 LAB — URINALYSIS, ROUTINE W REFLEX MICROSCOPIC
BILIRUBIN URINE: NEGATIVE
GLUCOSE, UA: NEGATIVE mg/dL
HGB URINE DIPSTICK: NEGATIVE
KETONES UR: NEGATIVE mg/dL
Nitrite: NEGATIVE
PROTEIN: 100 mg/dL — AB
Specific Gravity, Urine: 1.017 (ref 1.005–1.030)
pH: 6 (ref 5.0–8.0)

## 2016-08-28 LAB — CBC
HCT: 37.8 % (ref 36.0–46.0)
Hemoglobin: 12.6 g/dL (ref 12.0–15.0)
MCH: 29.1 pg (ref 26.0–34.0)
MCHC: 33.3 g/dL (ref 30.0–36.0)
MCV: 87.3 fL (ref 78.0–100.0)
PLATELETS: 229 10*3/uL (ref 150–400)
RBC: 4.33 MIL/uL (ref 3.87–5.11)
RDW: 13 % (ref 11.5–15.5)
WBC: 8.3 10*3/uL (ref 4.0–10.5)

## 2016-08-28 LAB — URINE MICROSCOPIC-ADD ON
Bacteria, UA: NONE SEEN
RBC / HPF: NONE SEEN RBC/hpf (ref 0–5)

## 2016-08-28 LAB — COMPREHENSIVE METABOLIC PANEL
ALK PHOS: 70 U/L (ref 38–126)
ALT: 21 U/L (ref 14–54)
AST: 21 U/L (ref 15–41)
Albumin: 4.2 g/dL (ref 3.5–5.0)
Anion gap: 9 (ref 5–15)
BUN: 8 mg/dL (ref 6–20)
CHLORIDE: 105 mmol/L (ref 101–111)
CO2: 25 mmol/L (ref 22–32)
CREATININE: 0.92 mg/dL (ref 0.44–1.00)
Calcium: 9.3 mg/dL (ref 8.9–10.3)
GFR calc Af Amer: >60 mL/min (ref >60)
Glucose, Bld: 89 mg/dL (ref 65–99)
Potassium: 3.7 mmol/L (ref 3.5–5.1)
SODIUM: 139 mmol/L (ref 135–145)
Total Bilirubin: 0.3 mg/dL (ref 0.3–1.2)
Total Protein: 7.3 g/dL (ref 6.5–8.1)

## 2016-08-28 LAB — LIPASE, BLOOD: LIPASE: 38 U/L (ref 11–51)

## 2016-08-28 LAB — I-STAT BETA HCG BLOOD, ED (MC, WL, AP ONLY): I-stat hCG, quantitative: <5 m[IU]/mL (ref <5)

## 2016-08-28 MED ORDER — OXYCODONE-ACETAMINOPHEN 5-325 MG PO TABS
ORAL_TABLET | ORAL | Status: AC
  Administered 2016-08-28: 1
  Filled 2016-08-28: qty 1

## 2016-08-28 MED ORDER — ONDANSETRON 4 MG PO TBDP
4.0000 mg | ORAL_TABLET | Freq: Once | ORAL | Status: AC
  Administered 2016-08-28: 4 mg via ORAL

## 2016-08-28 MED ORDER — NAPROXEN 500 MG PO TABS: 500.0000 mg | ORAL_TABLET | Freq: Two times a day (BID) | ORAL | 0 refills | Status: DC

## 2016-08-28 MED ORDER — OXYCODONE-ACETAMINOPHEN 5-325 MG PO TABS: 1.0000 | ORAL_TABLET | ORAL | Status: DC | PRN

## 2016-08-28 MED ORDER — NAPROXEN 250 MG PO TABS
500.0000 mg | ORAL_TABLET | Freq: Once | ORAL | Status: AC
  Administered 2016-08-28: 500 mg via ORAL
  Filled 2016-08-28: qty 2

## 2016-08-28 MED ORDER — ONDANSETRON 4 MG PO TBDP
ORAL_TABLET | ORAL | Status: AC
  Filled 2016-08-28: qty 1

## 2016-08-28 NOTE — ED Provider Notes (Signed)
[facility] Provider Note   CSN: [id number] Arrival date & time: 08/28/16  1626     History   Chief Complaint Chief Complaint  Patient presents with  . Back Pain    HPI Tabitha Smith is a 25 y.o. female.  Patient presents with bilateral flank/thoracic back pain that began on Friday with no known cause. Denies traumatic injury, twisting, or bending. Denies dysuria, fevers, diarrhea, constipation. Here with her mother who reports she felt "a knot" on her back that was mobile and is now gone.     The history is provided by the patient and a parent. No language interpreter was used.  Back Pain   This is a new problem. The current episode started more than 2 days ago (three days prior to arrival). The problem occurs daily. The problem has not changed since onset.The pain is associated with no known injury. The pain is present in the thoracic spine. The quality of the pain is described as aching and shooting. The pain does not radiate. The pain is at a severity of 5/10. The pain is moderate. Exacerbated by: Palpation. The pain is the same all the time. Pertinent negatives include no chest pain, no fever, no abdominal pain, no bowel incontinence, no pelvic pain and no weakness. She has tried NSAIDs for the symptoms. The treatment provided no relief.    Past Medical History:  Diagnosis Date  . Migraine     There are no active problems to display for this patient.   History reviewed. No pertinent surgical history.  OB History    No data available       Home Medications    Prior to Admission medications   Medication Sig Start Date End Date Taking? Authorizing Provider  clotrimazole (LOTRIMIN) 1 % cream Apply to affected area 2 times daily 08/03/16   Eustace MooreLaura W Murray, MD  mupirocin ointment (BACTROBAN) 2 % Apply 1 application topically 2 (two) times daily. To cracked areas of nipples 08/03/16   Eustace MooreLaura W Murray, MD  naproxen (NAPROSYN) 500 MG tablet Take 1 tablet (500 mg  total) by mouth 2 (two) times daily. 08/28/16   Preston FleetingAnna Eustacia Urbanek, MD  traMADol (ULTRAM) 50 MG tablet Take 1 tablet (50 mg total) by mouth every 6 (six) hours as needed. 07/27/16   Marlon Peliffany Greene, PA-C    Family History No family history on file.  Social History Social History  Substance Use Topics  . Smoking status: Current Every Day Smoker    Packs/day: 0.25  . Smokeless tobacco: Never Used  . Alcohol use No     Allergies   Penicillins   Review of Systems Review of Systems  Constitutional: Negative for fever.  HENT: Negative.   Respiratory: Negative for cough and shortness of breath.   Cardiovascular: Negative for chest pain.  Gastrointestinal: Negative for abdominal pain and bowel incontinence.  Genitourinary: Negative for pelvic pain.  Musculoskeletal: Positive for back pain. Negative for neck pain.  Skin: Negative.   Neurological: Negative for weakness.  Hematological: Does not bruise/bleed easily.  Psychiatric/Behavioral: Negative.      Physical Exam Updated Vital Signs BP 105/63 (BP Location: Left Arm)   Pulse 73   Temp 98.7 F (37.1 C) (Oral)   Resp 12   Ht 5\' 6"  (1.676 m)   Wt 66.7 kg   LMP 08/14/2016   SpO2 99%   BMI 23.73 kg/m   Physical Exam  Constitutional: She appears well-developed and well-nourished. No distress.  HENT:  Head: Normocephalic  and atraumatic.  Eyes: Conjunctivae and EOM are normal. Pupils are equal, round, and reactive to light.  Neck: Normal range of motion. Neck supple.  Cardiovascular: Normal rate, regular rhythm, normal heart sounds and intact distal pulses.  Exam reveals no gallop and no friction rub.   No murmur heard. Pulmonary/Chest: Effort normal and breath sounds normal. No respiratory distress. She has no decreased breath sounds. She has no wheezes. She has no rales. She exhibits no tenderness.  Abdominal: Soft. She exhibits no distension and no mass. There is no tenderness. There is CVA tenderness. There is no rebound  and no guarding.  Musculoskeletal:  Lateral thoracic spine tenderness, no midline tenderness or stepoff deformity.  Skin: Skin is warm and dry. Capillary refill takes less than 2 seconds. She is not diaphoretic.  Psychiatric: She has a normal mood and affect. Her behavior is normal. Judgment and thought content normal.     ED Treatments / Results  Labs (all labs ordered are listed, but only abnormal results are displayed) Labs Reviewed  URINALYSIS, ROUTINE W REFLEX MICROSCOPIC (NOT AT [facility]) - Abnormal; Notable for the following:       Result Value   APPearance CLOUDY (*)    Protein, ur 100 (*)    Leukocytes, UA MODERATE (*)    All other components within normal limits  URINE MICROSCOPIC-ADD ON - Abnormal; Notable for the following:    Squamous Epithelial / LPF TOO NUMEROUS TO COUNT (*)    All other components within normal limits  LIPASE, BLOOD  COMPREHENSIVE METABOLIC PANEL  CBC  I-STAT BETA HCG BLOOD, ED ([facility], [facility], [facility] ONLY)    EKG  EKG Interpretation None       Radiology Dg Abdomen 1 View  Result Date: 08/28/2016 CLINICAL DATA:  Left flank pain with nausea and vomiting EXAM: ABDOMEN - 1 VIEW COMPARISON:  None. FINDINGS: The bowel gas pattern is normal. No radio-opaque calculi or other significant radiographic abnormality are seen. IMPRESSION: Negative. Electronically Signed   By: Jasmine Pang M.D.   On: 08/28/2016 20:27    Procedures Procedures (including critical care time)  Medications Ordered in ED Medications  ondansetron (ZOFRAN-ODT) disintegrating tablet 4 mg (4 mg Oral Given 08/28/16 1731)  oxyCODONE-acetaminophen (PERCOCET/ROXICET) 5-325 MG per tablet (1 tablet  Given 08/28/16 1731)  naproxen (NAPROSYN) tablet 500 mg (500 mg Oral Given 08/28/16 2034)     Initial Impression / Assessment and Plan / ED Course  I have reviewed the triage vital signs and the nursing notes.  Pertinent labs & imaging results that were available during my care of the patient  were reviewed by me and considered in my medical decision making (see chart for details).  Clinical Course    Patient presents with three day history of bilateral flank/thoracic back pain without a known trigger, no known injury. She is overall well appearing and ambulatory with normal vital signs. Labs are unremarkable, she has no abdominal tenderness. UA unremarkable without signs of UTI or pyelonephritis. KUB also unremarkable. Do not suspect fracture as she has no midline tenderness and no injury. Likely her pain is muscular as it is reproducible to palpation. Was given prescription for naproxen and advised to follow up within the next several days with PCP. Given return precautions for worsening symptoms. She expressed understanding and is in good condition for discharge home.  Final Clinical Impressions(s) / ED Diagnoses   Final diagnoses:  Acute bilateral thoracic back pain    New Prescriptions Discharge Medication List as  of 08/28/2016  9:12 PM    START taking these medications   Details  naproxen (NAPROSYN) 500 MG tablet Take 1 tablet (500 mg total) by mouth 2 (two) times daily., Starting Mon 08/28/2016, Print         Preston Fleeting, MD 08/30/16 8185    Nira Conn, MD 08/31/16 6314

## 2016-08-28 NOTE — ED Triage Notes (Signed)
Pt states mid back pain with "knotts" and states the pain is "deep inside".  Denies changes in bowel/bladder habits.  Denies burning with urination or vaginal discharge.  Emesis x 2 today.

## 2016-12-09 ENCOUNTER — Emergency Department (HOSPITAL_COMMUNITY)
Admission: EM | Admit: 2016-12-09 | Discharge: 2016-12-09 | Disposition: A | Discharge status: Home / Self Care | Payer: Medicaid Other | Attending: Emergency Medicine | Admitting: Emergency Medicine

## 2016-12-09 ENCOUNTER — Encounter (HOSPITAL_COMMUNITY): Payer: Self-pay | Admitting: Nurse Practitioner

## 2016-12-09 DIAGNOSIS — Z79899 Other long term (current) drug therapy: Secondary | ICD-10-CM | POA: Insufficient documentation

## 2016-12-09 DIAGNOSIS — F17200 Nicotine dependence, unspecified, uncomplicated: Secondary | ICD-10-CM | POA: Insufficient documentation

## 2016-12-09 DIAGNOSIS — R519 Headache, unspecified: Secondary | ICD-10-CM

## 2016-12-09 DIAGNOSIS — R51 Headache: Secondary | ICD-10-CM | POA: Insufficient documentation

## 2016-12-09 MED ORDER — SODIUM CHLORIDE 0.9 % IV BOLUS (SEPSIS)
500.0000 mL | Freq: Once | INTRAVENOUS | Status: AC
Start: 2016-12-09 — End: 2016-12-09
  Administered 2016-12-09: 500 mL via INTRAVENOUS

## 2016-12-09 MED ORDER — PROMETHAZINE HCL 25 MG PO TABS: 25.0000 mg | ORAL_TABLET | Freq: Four times a day (QID) | ORAL | 0 refills | Status: DC | PRN

## 2016-12-09 MED ORDER — DEXAMETHASONE SODIUM PHOSPHATE 10 MG/ML IJ SOLN
10.0000 mg | Freq: Once | INTRAMUSCULAR | Status: AC
  Administered 2016-12-09: 10 mg via INTRAVENOUS
  Filled 2016-12-09: qty 1

## 2016-12-09 MED ORDER — DIPHENHYDRAMINE HCL 50 MG/ML IJ SOLN
25.0000 mg | Freq: Once | INTRAMUSCULAR | Status: AC
  Administered 2016-12-09: 25 mg via INTRAVENOUS
  Filled 2016-12-09: qty 1

## 2016-12-09 MED ORDER — METOCLOPRAMIDE HCL 5 MG/ML IJ SOLN
10.0000 mg | Freq: Once | INTRAMUSCULAR | Status: AC
  Administered 2016-12-09: 10 mg via INTRAVENOUS
  Filled 2016-12-09: qty 2

## 2016-12-09 MED ORDER — KETOROLAC TROMETHAMINE 15 MG/ML IJ SOLN
15.0000 mg | Freq: Once | INTRAMUSCULAR | Status: AC
  Administered 2016-12-09: 15 mg via INTRAVENOUS
  Filled 2016-12-09: qty 1

## 2016-12-09 NOTE — ED Notes (Signed)
Pt was laying under blanket when slowly pushing decadron. Pt suddenly jumped up and c/o burning all over body. RN assisted pt in deep breathing and informed pt likely SE. MD informed with verbaly order of 25mg  benadryl repeated, RN returned to pt and symptoms had subsided. MD sts to hold benadryl.

## 2016-12-09 NOTE — ED Triage Notes (Signed)
Pt presents with c/o migraine headache. The pain began 8 days ago. The pain is bilateral but worse on the right side. She reports nausea, vomiting, photo/phonobia. She has tried Excedrin migraine, bc powder, tylenol, aleve, motrin with no relief. This feels like her past migraines

## 2016-12-09 NOTE — ED Provider Notes (Signed)
[facility]-EMERGENCY DEPT Provider Note   CSN: [id number] Arrival date & time: 12/09/16  1638     History   Chief Complaint Chief Complaint  Patient presents with  . Migraine    HPI Shauniece Smith is a 26 y.o. female.  The history is provided by the patient. No language interpreter was used.   Tabitha Smith is a 26 y.o. female who presents to the Emergency Department complaining of headache. She has a history of migraine headaches and reports headache for the last 8 days typical of her migraines. It is located on bilateral sides of her head, right greater than left. She has tried multiple over-the-counter medications with no improvement in her symptoms. She has associated photophobia and nausea. No fevers, numbness, weakness. She recently moved here from [place] and has not established with a primary care provider yet. Past Medical History:  Diagnosis Date  . Migraine     There are no active problems to display for this patient.   History reviewed. No pertinent surgical history.  OB History    No data available       Home Medications    Prior to Admission medications   Medication Sig Start Date End Date Taking? Authorizing Provider  clotrimazole (LOTRIMIN) 1 % cream Apply to affected area 2 times daily 08/03/16   Eustace Moore, MD  mupirocin ointment (BACTROBAN) 2 % Apply 1 application topically 2 (two) times daily. To cracked areas of nipples 08/03/16   Eustace Moore, MD  naproxen (NAPROSYN) 500 MG tablet Take 1 tablet (500 mg total) by mouth 2 (two) times daily. 08/28/16   Preston Fleeting, MD  promethazine (PHENERGAN) 25 MG tablet Take 1 tablet (25 mg total) by mouth every 6 (six) hours as needed for nausea or vomiting. 12/09/16   Tilden Fossa, MD  traMADol (ULTRAM) 50 MG tablet Take 1 tablet (50 mg total) by mouth every 6 (six) hours as needed. 07/27/16   Marlon Pel, PA-C    Family History History reviewed. No pertinent family history.  Social History Social  History  Substance Use Topics  . Smoking status: Current Every Day Smoker    Packs/day: 0.25  . Smokeless tobacco: Never Used  . Alcohol use No     Allergies   Penicillins   Review of Systems Review of Systems  All other systems reviewed and are negative.    Physical Exam Updated Vital Signs BP 112/62 (BP Location: Right Arm)   Pulse 80   Temp 99.3 F (37.4 C) (Oral)   Resp 16   Ht 5\' 5"  (1.651 m)   Wt 160 lb (72.6 kg)   SpO2 99%   BMI 26.63 kg/m   Physical Exam  Constitutional: She is oriented to person, place, and time. She appears well-developed and well-nourished.  HENT:  Head: Normocephalic and atraumatic.  Eyes: EOM are normal. Pupils are equal, round, and reactive to light.  Photophobia  Neck: Neck supple.  Cardiovascular: Normal rate and regular rhythm.   No murmur heard. Pulmonary/Chest: Effort normal and breath sounds normal. No respiratory distress.  Abdominal: Soft. There is no tenderness. There is no rebound and no guarding.  Musculoskeletal: She exhibits no edema or tenderness.  Neurological: She is alert and oriented to person, place, and time.  Skin: Skin is warm and dry.  Psychiatric: She has a normal mood and affect. Her behavior is normal.  Nursing note and vitals reviewed.    ED Treatments / Results  Labs (all labs ordered are listed,  but only abnormal results are displayed) Labs Reviewed - No data to display  EKG  EKG Interpretation None       Radiology No results found.  Procedures Procedures (including critical care time)  Medications Ordered in ED Medications  dexamethasone (DECADRON) injection 10 mg (10 mg Intravenous Given 12/09/16 1917)  diphenhydrAMINE (BENADRYL) injection 25 mg (25 mg Intravenous Given 12/09/16 1916)  metoCLOPramide (REGLAN) injection 10 mg (10 mg Intravenous Given 12/09/16 1924)  sodium chloride 0.9 % bolus 500 mL (0 mLs Intravenous Stopped 12/09/16 1948)  ketorolac (TORADOL) 15 MG/ML injection 15 mg  (15 mg Intravenous Given 12/09/16 1915)     Initial Impression / Assessment and Plan / ED Course  I have reviewed the triage vital signs and the nursing notes.  Pertinent labs & imaging results that were available during my care of the patient were reviewed by me and considered in my medical decision making (see chart for details).     Pt with hx/o migraine headaches here with HA similar to priors.  She is nontoxic on exam and improved after headache treatment in ED.  Current clinical picture is not c/w SAH, meningitis, CVA, dural sinus thrombosis.  Discussed with patient home care for headache, outpatient follow up and return precautions.   Final Clinical Impressions(s) / ED Diagnoses   Final diagnoses:  Bad headache    New Prescriptions Discharge Medication List as of 12/09/2016  8:13 PM    START taking these medications   Details  promethazine (PHENERGAN) 25 MG tablet Take 1 tablet (25 mg total) by mouth every 6 (six) hours as needed for nausea or vomiting., Starting Sat 12/09/2016, Print         Tilden Fossa, MD 12/10/16 1251

## 2017-03-29 ENCOUNTER — Emergency Department: Payer: Self-pay

## 2017-03-29 ENCOUNTER — Emergency Department
Admission: EM | Admit: 2017-03-29 | Discharge: 2017-03-29 | Disposition: A | Discharge status: Home / Self Care | Payer: Self-pay | Attending: Emergency Medicine | Admitting: Emergency Medicine

## 2017-03-29 DIAGNOSIS — R609 Edema, unspecified: Secondary | ICD-10-CM

## 2017-03-29 DIAGNOSIS — F17200 Nicotine dependence, unspecified, uncomplicated: Secondary | ICD-10-CM | POA: Insufficient documentation

## 2017-03-29 DIAGNOSIS — R600 Localized edema: Secondary | ICD-10-CM | POA: Insufficient documentation

## 2017-03-29 DIAGNOSIS — Z79899 Other long term (current) drug therapy: Secondary | ICD-10-CM | POA: Insufficient documentation

## 2017-03-29 LAB — CBC WITH DIFFERENTIAL/PLATELET
Basophils Absolute: 0.1 10*3/uL (ref 0–0.1)
Basophils Relative: 1 %
Eosinophils Absolute: 0.1 10*3/uL (ref 0–0.7)
Eosinophils Relative: 1 %
HCT: 33 % — ABNORMAL LOW (ref 35.0–47.0)
Hemoglobin: 11.2 g/dL — ABNORMAL LOW (ref 12.0–16.0)
Lymphocytes Relative: 29 %
Lymphs Abs: 2.2 10*3/uL (ref 1.0–3.6)
MCH: 29.4 pg (ref 26.0–34.0)
MCHC: 33.8 g/dL (ref 32.0–36.0)
MCV: 87 fL (ref 80.0–100.0)
Monocytes Absolute: 0.5 10*3/uL (ref 0.2–0.9)
Monocytes Relative: 7 %
Neutro Abs: 4.8 10*3/uL (ref 1.4–6.5)
Neutrophils Relative %: 62 %
Platelets: 239 10*3/uL (ref 150–440)
RBC: 3.79 MIL/uL — ABNORMAL LOW (ref 3.80–5.20)
RDW: 13.7 % (ref 11.5–14.5)
WBC: 7.7 10*3/uL (ref 3.6–11.0)

## 2017-03-29 LAB — COMPREHENSIVE METABOLIC PANEL
ALT: 25 U/L (ref 14–54)
AST: 28 U/L (ref 15–41)
Albumin: 4.1 g/dL (ref 3.5–5.0)
Alkaline Phosphatase: 53 U/L (ref 38–126)
Anion gap: 7 (ref 5–15)
BUN: 8 mg/dL (ref 6–20)
CO2: 26 mmol/L (ref 22–32)
Calcium: 9.4 mg/dL (ref 8.9–10.3)
Chloride: 106 mmol/L (ref 101–111)
Creatinine, Ser: 0.79 mg/dL (ref 0.44–1.00)
GFR calc Af Amer: >60 mL/min (ref >60)
GFR calc non Af Amer: >60 mL/min (ref >60)
Glucose, Bld: 107 mg/dL — ABNORMAL HIGH (ref 65–99)
Potassium: 3.2 mmol/L — ABNORMAL LOW (ref 3.5–5.1)
Sodium: 139 mmol/L (ref 135–145)
Total Bilirubin: 0.6 mg/dL (ref 0.3–1.2)
Total Protein: 7.2 g/dL (ref 6.5–8.1)

## 2017-03-29 LAB — BRAIN NATRIURETIC PEPTIDE: B Natriuretic Peptide: 12 pg/mL (ref 0.0–100.0)

## 2017-03-29 NOTE — ED Notes (Signed)
Pt presents with swollen legs x 2 days. She reports that her shorts were loose round the leg band 2 days ago, and today they are tight. She says that the lower part of her legs are more swollen than the upper. She reports pain upon walking and when the legs are touched. Pt denies any other symptoms. NAD noted.

## 2017-03-29 NOTE — ED Notes (Signed)
Pt discharged home after verbalizing understanding of discharge instructions; nad noted. 

## 2017-03-29 NOTE — ED Provider Notes (Signed)
[id number] [facility] Emergency Department Provider Note  ____________________________________________  Time seen: Approximately 6:41 PM  I have reviewed the triage vital signs and the nursing notes.   HISTORY  Chief Complaint Leg Swelling and Arm Swelling    HPI Tabitha Smith is a 26 y.o. female presenting to the emergency department with self reported peripheral edema of the upper and lower extremities for the past 3 days. Patient states that she is wearing a pair of shorts that before 3 days ago, "were loose". Patient denies instances of trauma, knowledge of malignancy, chest pain, chest tightness, shortness of breath, history of heart failure, prior DVT, prolonged immobility, recent surgery, use of contraceptives, recent weight gain or recent weight loss. No history of renal disease. No night sweats or bony pain. Patient has been afebrile. She has noticed no facial swelling. Patient has not experienced similar symptoms in the past. Patient denies consuming salty foods. No alleviating measures have been attempted. Patient is a daily smoker.   Past Medical History:  Diagnosis Date  . Migraine     There are no active problems to display for this patient.   History reviewed. No pertinent surgical history.  Prior to Admission medications   Medication Sig Start Date End Date Taking? Authorizing Provider  clotrimazole (LOTRIMIN) 1 % cream Apply to affected area 2 times daily 08/03/16   Eustace Moore, MD  mupirocin ointment (BACTROBAN) 2 % Apply 1 application topically 2 (two) times daily. To cracked areas of nipples 08/03/16   Eustace Moore, MD  naproxen (NAPROSYN) 500 MG tablet Take 1 tablet (500 mg total) by mouth 2 (two) times daily. 08/28/16   Preston Fleeting, MD  promethazine (PHENERGAN) 25 MG tablet Take 1 tablet (25 mg total) by mouth every 6 (six) hours as needed for nausea or vomiting. 12/09/16   Tilden Fossa, MD  traMADol (ULTRAM) 50 MG tablet Take 1 tablet  (50 mg total) by mouth every 6 (six) hours as needed. 07/27/16   Marlon Pel, PA-C    Allergies Penicillins  No family history on file.  Social History Social History  Substance Use Topics  . Smoking status: Current Every Day Smoker    Packs/day: 0.25  . Smokeless tobacco: Never Used  . Alcohol use No     Review of Systems  Constitutional: No fever/chills Eyes: No visual changes. No discharge ENT: No upper respiratory complaints. Cardiovascular: no chest pain. Respiratory: no cough. No SOB. Gastrointestinal: No abdominal pain.  No nausea, no vomiting.  No diarrhea.  No constipation. Genitourinary: Negative for dysuria. No hematuria Musculoskeletal: Negative for musculoskeletal pain. Skin: Patient has peripheral edema of the lower extremities. Neurological: Negative for headaches, focal weakness or numbness.   ____________________________________________   PHYSICAL EXAM:  VITAL SIGNS: ED Triage Vitals [03/29/17 1611]  Enc Vitals Group     BP (!) 143/85     Pulse Rate 78     Resp 16     Temp 98.5 F (36.9 C)     Temp Source Oral     SpO2 100 %     Weight 170 lb (77.1 kg)     Height 5\' 6"  (1.676 m)     Head Circumference      Peak Flow      Pain Score 6     Pain Loc      Pain Edu?      Excl. in GC?      Constitutional: Alert and oriented. Well appearing and in no acute  distress. Eyes: Conjunctivae are normal. PERRL. EOMI. Head: Atraumatic. Hematological/Lymphatic/Immunilogical: No cervical lymphadenopathy. Cardiovascular: Normal rate, regular rhythm. Normal S1 and S2.  Good peripheral circulation. Respiratory: Normal respiratory effort without tachypnea or retractions. Lungs CTAB. Good air entry to the bases with no decreased or absent breath sounds. Gastrointestinal: Bowel sounds 4 quadrants. Soft and nontender to palpation. No guarding or rigidity. No palpable masses. No distention. No CVA tenderness. Musculoskeletal: Full range of motion to all  extremities. No gross deformities appreciated. Neurologic:  Normal speech and language. No gross focal neurologic deficits are appreciated.  Skin: Patient has 1+ edema of the ankles bilaterally. No erythema, rash or bruising of the extremities bilaterally. Palpable radial, ulnar and dorsalis pedis pulses bilaterally and symmetrically. No edema of the upper extremities. Psychiatric: Mood and affect are normal. Speech and behavior are normal. Patient exhibits appropriate insight and judgement.   ____________________________________________   LABS (all labs ordered are listed, but only abnormal results are displayed)  Labs Reviewed  CBC WITH DIFFERENTIAL/PLATELET - Abnormal; Notable for the following:       Result Value   RBC 3.79 (*)    Hemoglobin 11.2 (*)    HCT 33.0 (*)    All other components within normal limits  COMPREHENSIVE METABOLIC PANEL - Abnormal; Notable for the following:    Potassium 3.2 (*)    Glucose, Bld 107 (*)    All other components within normal limits  BRAIN NATRIURETIC PEPTIDE   ____________________________________________  EKG   ____________________________________________  RADIOLOGY Geraldo Pitter, personally viewed and evaluated these images (plain radiographs) as part of my medical decision making, as well as reviewing the written report by the radiologist.  Dg Chest 2 View  Result Date: 03/29/2017 CLINICAL DATA:  Swollen legs for 2 days, BILATERAL peripheral edema EXAM: CHEST  2 VIEW COMPARISON:  None FINDINGS: Normal heart size, mediastinal contours, and pulmonary vascularity. Lungs clear. No pleural effusion or pneumothorax. Bones unremarkable. IMPRESSION: Normal exam. Electronically Signed   By: Ulyses Southward M.D.   On: 03/29/2017 17:46    ____________________________________________    PROCEDURES  Procedure(s) performed:    Procedures    Medications - No data to display   ____________________________________________   INITIAL  IMPRESSION / ASSESSMENT AND PLAN / ED COURSE  Pertinent labs & imaging results that were available during my care of the patient were reviewed by me and considered in my medical decision making (see chart for details).  Review of the [place] CSRS was performed in accordance of the [facility] prior to dispensing any controlled drugs.     Assessment and plan: Peripheral edema Patient presents to the emergency department with 1+ edema at the ankles bilaterally. Patient has no edema of the upper extremities. CBC, CMP, chest x-ray and BNP were reassuring. Due to low risk of DVT according to [facility] criteria, further workup with bilateral lower extremity ultrasounds were not warranted at this time.Patient was advised to follow-up with primary care as needed. All patient questions were answered. Vital signs were reassuring prior to discharge.    ____________________________________________  FINAL CLINICAL IMPRESSION(S) / ED DIAGNOSES  Final diagnoses:  Peripheral edema      NEW MEDICATIONS STARTED DURING THIS VISIT:  New Prescriptions   No medications on file        This chart was dictated using voice recognition software/Dragon. Despite best efforts to proofread, errors can occur which can change the meaning. Any change was purely unintentional.    Orvil Feil, PA-C 03/29/17 1853  Loleta Rose, MD 03/29/17 [phone]

## 2017-03-29 NOTE — ED Triage Notes (Signed)
Pt started 2 days ago with swelling in her legs/ankles and arms/hands - pt denies any other symptoms

## 2017-07-19 ENCOUNTER — Emergency Department
Admission: EM | Admit: 2017-07-19 | Discharge: 2017-07-19 | Disposition: A | Discharge status: Home / Self Care | Payer: BLUE CROSS/BLUE SHIELD | Attending: Emergency Medicine | Admitting: Emergency Medicine

## 2017-07-19 ENCOUNTER — Encounter: Payer: Self-pay | Admitting: Emergency Medicine

## 2017-07-19 DIAGNOSIS — R51 Headache: Secondary | ICD-10-CM | POA: Insufficient documentation

## 2017-07-19 DIAGNOSIS — R519 Headache, unspecified: Secondary | ICD-10-CM

## 2017-07-19 DIAGNOSIS — Z8669 Personal history of other diseases of the nervous system and sense organs: Secondary | ICD-10-CM | POA: Insufficient documentation

## 2017-07-19 DIAGNOSIS — F17210 Nicotine dependence, cigarettes, uncomplicated: Secondary | ICD-10-CM | POA: Insufficient documentation

## 2017-07-19 LAB — COMPREHENSIVE METABOLIC PANEL
ALK PHOS: 50 U/L (ref 38–126)
ALT: 19 U/L (ref 14–54)
AST: 21 U/L (ref 15–41)
Albumin: 4 g/dL (ref 3.5–5.0)
Anion gap: 7 (ref 5–15)
BUN: 7 mg/dL (ref 6–20)
CALCIUM: 9.3 mg/dL (ref 8.9–10.3)
CO2: 28 mmol/L (ref 22–32)
CREATININE: 0.81 mg/dL (ref 0.44–1.00)
Chloride: 107 mmol/L (ref 101–111)
Glucose, Bld: 86 mg/dL (ref 65–99)
Potassium: 3.7 mmol/L (ref 3.5–5.1)
Sodium: 142 mmol/L (ref 135–145)
Total Bilirubin: 0.4 mg/dL (ref 0.3–1.2)
Total Protein: 7.3 g/dL (ref 6.5–8.1)

## 2017-07-19 LAB — URINALYSIS, COMPLETE (UACMP) WITH MICROSCOPIC
BILIRUBIN URINE: NEGATIVE
Glucose, UA: NEGATIVE mg/dL
Hgb urine dipstick: NEGATIVE
Ketones, ur: NEGATIVE mg/dL
Leukocytes, UA: NEGATIVE
NITRITE: NEGATIVE
PH: 7 (ref 5.0–8.0)
Protein, ur: NEGATIVE mg/dL
RBC / HPF: NONE SEEN RBC/hpf (ref 0–5)
SPECIFIC GRAVITY, URINE: 1.003 — AB (ref 1.005–1.030)

## 2017-07-19 LAB — CBC WITH DIFFERENTIAL/PLATELET
Basophils Absolute: 0 10*3/uL (ref 0–0.1)
Basophils Relative: 0 %
Eosinophils Absolute: 0.1 10*3/uL (ref 0–0.7)
Eosinophils Relative: 2 %
HEMATOCRIT: 38.6 % (ref 35.0–47.0)
HEMOGLOBIN: 12.9 g/dL (ref 12.0–16.0)
LYMPHS ABS: 2.6 10*3/uL (ref 1.0–3.6)
Lymphocytes Relative: 41 %
MCH: 28.6 pg (ref 26.0–34.0)
MCHC: 33.3 g/dL (ref 32.0–36.0)
MCV: 86 fL (ref 80.0–100.0)
Monocytes Absolute: 0.6 10*3/uL (ref 0.2–0.9)
Monocytes Relative: 9 %
NEUTROS ABS: 3.1 10*3/uL (ref 1.4–6.5)
Neutrophils Relative %: 48 %
Platelets: 227 10*3/uL (ref 150–440)
RBC: 4.49 MIL/uL (ref 3.80–5.20)
RDW: 14.6 % — ABNORMAL HIGH (ref 11.5–14.5)
WBC: 6.5 10*3/uL (ref 3.6–11.0)

## 2017-07-19 LAB — URINE DRUG SCREEN, QUALITATIVE (ARMC ONLY)
Amphetamines, Ur Screen: NOT DETECTED
BARBITURATES, UR SCREEN: NOT DETECTED
Benzodiazepine, Ur Scrn: NOT DETECTED
COCAINE METABOLITE, UR ~~LOC~~: NOT DETECTED
Cannabinoid 50 Ng, Ur ~~LOC~~: NOT DETECTED
MDMA (ECSTASY) UR SCREEN: NOT DETECTED
METHADONE SCREEN, URINE: NOT DETECTED
OPIATE, UR SCREEN: NOT DETECTED
Phencyclidine (PCP) Ur S: NOT DETECTED
TRICYCLIC, UR SCREEN: NOT DETECTED

## 2017-07-19 LAB — POCT PREGNANCY, URINE: Preg Test, Ur: NEGATIVE

## 2017-07-19 MED ORDER — KETOROLAC TROMETHAMINE 30 MG/ML IJ SOLN
30.0000 mg | Freq: Once | INTRAMUSCULAR | Status: AC
  Administered 2017-07-19: 30 mg via INTRAVENOUS
  Filled 2017-07-19: qty 1

## 2017-07-19 MED ORDER — METOCLOPRAMIDE HCL 5 MG/ML IJ SOLN
10.0000 mg | Freq: Once | INTRAMUSCULAR | Status: AC
  Administered 2017-07-19: 10 mg via INTRAVENOUS
  Filled 2017-07-19: qty 2

## 2017-07-19 MED ORDER — BUTALBITAL-APAP-CAFFEINE 50-325-40 MG PO TABS
2.0000 | ORAL_TABLET | Freq: Once | ORAL | Status: AC
  Administered 2017-07-19: 2 via ORAL
  Filled 2017-07-19: qty 2

## 2017-07-19 NOTE — ED Provider Notes (Signed)
[facility] Emergency Department Provider Note       Time seen: ----------------------------------------- 9:51 AM on 07/19/2017 -----------------------------------------     I have reviewed the triage vital signs and the nursing notes.   HISTORY   Chief Complaint Headache    HPI Tabitha Smith is a 26 y.o. female who presents to the ED for Headache that began yesterday with reportedly some confusion. She arrives alert and oriented, reports she takes Fioricet for headaches. Patient states her headache right now is right sided, 9 out of 10. She's not had any other associated symptoms. Typically she has photophobia and phonophobia.   Past Medical History:  Diagnosis Date  . Migraine     There are no active problems to display for this patient.   History reviewed. No pertinent surgical history.  Allergies Penicillins  Social History Social History  Substance Use Topics  . Smoking status: Current Every Day Smoker    Packs/day: 0.25  . Smokeless tobacco: Never Used  . Alcohol use No   Review of Systems Constitutional: Negative for fever. Eyes: Negative for vision changes ENT:  Negative for congestion, sore throat Cardiovascular: Negative for chest pain. Respiratory: Negative for shortness of breath. Gastrointestinal: Negative for abdominal pain, vomiting and diarrhea. Genitourinary: Negative for dysuria. Musculoskeletal: Negative for back pain. Skin: Negative for rash. Neurological: positive for headache, confusion  All systems negative/normal/unremarkable except as stated in the HPI  ____________________________________________   PHYSICAL EXAM:  VITAL SIGNS: ED Triage Vitals  Enc Vitals Group     BP 07/19/17 0844 (!) 145/85     Pulse Rate 07/19/17 0844 64     Resp 07/19/17 0844 20     Temp 07/19/17 0844 98.3 F (36.8 C)     Temp Source 07/19/17 0844 Oral     SpO2 07/19/17 0844 100 %     Weight 07/19/17 0844 170 lb (77.1  kg)     Height --      Head Circumference --      Peak Flow --      Pain Score 07/19/17 0843 8     Pain Loc --      Pain Edu? --      Excl. in GC? --    Constitutional: Alert and oriented. tearful, no distress Eyes: Conjunctivae are normal. Normal extraocular movements. ENT   Head: Normocephalic and atraumatic.   Nose: No congestion/rhinnorhea.   Mouth/Throat: Mucous membranes are moist.   Neck: No stridor. Cardiovascular: Normal rate, regular rhythm. No murmurs, rubs, or gallops. Respiratory: Normal respiratory effort without tachypnea nor retractions. Breath sounds are clear and equal bilaterally. No wheezes/rales/rhonchi. Gastrointestinal: Soft and nontender. Normal bowel sounds Musculoskeletal: Nontender with normal range of motion in extremities. No lower extremity tenderness nor edema. Neurologic:  Normal speech and language. No gross focal neurologic deficits are appreciated. strength, sensation, cranial nerves is normal Skin:  Skin is warm, dry and intact. No rash noted. Psychiatric: bizarre behavior at times, speech is normal ____________________________________________  EKG: Interpreted by me.sinus rhythm rate of 58 bpm, normal PR interval, normal QRS, normal QT.  ____________________________________________  ED COURSE:  Pertinent labs & imaging results that were available during my care of the patient were reviewed by me and considered in my medical decision making (see chart for details). Patient presents for headache, we will assess with labs and imaging as indicated.   Procedures ____________________________________________   LABS (pertinent positives/negatives)  Labs Reviewed  CBC WITH DIFFERENTIAL/PLATELET - Abnormal; Notable for the following:  Result Value   RDW 14.6 (*)    All other components within normal limits  URINALYSIS, COMPLETE (UACMP) WITH MICROSCOPIC - Abnormal; Notable for the following:    Color, Urine STRAW (*)     APPearance CLEAR (*)    Specific Gravity, Urine 1.003 (*)    Bacteria, UA RARE (*)    Squamous Epithelial / LPF 0-5 (*)    All other components within normal limits  COMPREHENSIVE METABOLIC PANEL  URINE DRUG SCREEN, QUALITATIVE ([facility] ONLY)  POC URINE PREG, ED  POCT PREGNANCY, URINE  ____________________________________________  FINAL ASSESSMENT AND PLAN  headache   Plan: Patient's labs and imaging were dictated above. Patient had presented for headache of uncertain etiology. She appears neurologically intact and is able to ambulate here. She was given fewer set for headache. She appears stable for outpatient follow-up.   Emily Filbert, MD   Note: This note was generated in part or whole with voice recognition software. Voice recognition is usually quite accurate but there are transcription errors that can and very often do occur. I apologize for any typographical errors that were not detected and corrected.     Emily Filbert, MD 07/19/17 [phone]

## 2017-07-19 NOTE — ED Triage Notes (Signed)
Pt states started with headache yesterday with some confusion, pt a/ox3, takes Fioricet for headaches but has been unable to take due to her job.

## 2017-09-10 ENCOUNTER — Emergency Department (HOSPITAL_COMMUNITY)
Admission: EM | Admit: 2017-09-10 | Discharge: 2017-09-11 | Disposition: A | Discharge status: Home / Self Care | Payer: BLUE CROSS/BLUE SHIELD | Attending: Emergency Medicine | Admitting: Emergency Medicine

## 2017-09-10 ENCOUNTER — Emergency Department (HOSPITAL_COMMUNITY): Payer: BLUE CROSS/BLUE SHIELD

## 2017-09-10 ENCOUNTER — Other Ambulatory Visit: Payer: Self-pay

## 2017-09-10 ENCOUNTER — Encounter (HOSPITAL_COMMUNITY): Payer: Self-pay | Admitting: Emergency Medicine

## 2017-09-10 DIAGNOSIS — R1030 Lower abdominal pain, unspecified: Secondary | ICD-10-CM | POA: Diagnosis present

## 2017-09-10 DIAGNOSIS — R109 Unspecified abdominal pain: Secondary | ICD-10-CM

## 2017-09-10 DIAGNOSIS — F17200 Nicotine dependence, unspecified, uncomplicated: Secondary | ICD-10-CM | POA: Insufficient documentation

## 2017-09-10 LAB — COMPREHENSIVE METABOLIC PANEL
ALT: 18 U/L (ref 14–54)
AST: 20 U/L (ref 15–41)
Albumin: 3.9 g/dL (ref 3.5–5.0)
Alkaline Phosphatase: 47 U/L (ref 38–126)
Anion gap: 6 (ref 5–15)
BUN: <5 mg/dL — ABNORMAL LOW (ref 6–20)
CO2: 24 mmol/L (ref 22–32)
Calcium: 8.9 mg/dL (ref 8.9–10.3)
Chloride: 108 mmol/L (ref 101–111)
Creatinine, Ser: 0.89 mg/dL (ref 0.44–1.00)
GFR calc Af Amer: >60 mL/min (ref >60)
GFR calc non Af Amer: >60 mL/min (ref >60)
Glucose, Bld: 78 mg/dL (ref 65–99)
Potassium: 3.5 mmol/L (ref 3.5–5.1)
Sodium: 138 mmol/L (ref 135–145)
Total Bilirubin: 0.4 mg/dL (ref 0.3–1.2)
Total Protein: 6.8 g/dL (ref 6.5–8.1)

## 2017-09-10 LAB — URINALYSIS, ROUTINE W REFLEX MICROSCOPIC
Bilirubin Urine: NEGATIVE
Glucose, UA: NEGATIVE mg/dL
Hgb urine dipstick: NEGATIVE
Ketones, ur: NEGATIVE mg/dL
Leukocytes, UA: NEGATIVE
Nitrite: NEGATIVE
Protein, ur: NEGATIVE mg/dL
Specific Gravity, Urine: 1.011 (ref 1.005–1.030)
pH: 7 (ref 5.0–8.0)

## 2017-09-10 LAB — I-STAT BETA HCG BLOOD, ED (MC, WL, AP ONLY): I-stat hCG, quantitative: <5 m[IU]/mL (ref <5)

## 2017-09-10 LAB — CBC
HCT: 35.5 % — ABNORMAL LOW (ref 36.0–46.0)
Hemoglobin: 11.8 g/dL — ABNORMAL LOW (ref 12.0–15.0)
MCH: 29 pg (ref 26.0–34.0)
MCHC: 33.2 g/dL (ref 30.0–36.0)
MCV: 87.2 fL (ref 78.0–100.0)
Platelets: 239 10*3/uL (ref 150–400)
RBC: 4.07 MIL/uL (ref 3.87–5.11)
RDW: 14.1 % (ref 11.5–15.5)
WBC: 12.4 10*3/uL — ABNORMAL HIGH (ref 4.0–10.5)

## 2017-09-10 LAB — LIPASE, BLOOD: Lipase: 34 U/L (ref 11–51)

## 2017-09-10 MED ORDER — IOPAMIDOL (ISOVUE-300) INJECTION 61%
INTRAVENOUS | Status: AC
  Administered 2017-09-10: 100 mL via INTRAVENOUS
  Filled 2017-09-10: qty 100

## 2017-09-10 MED ORDER — KETOROLAC TROMETHAMINE 15 MG/ML IJ SOLN
15.0000 mg | Freq: Once | INTRAMUSCULAR | Status: AC
  Administered 2017-09-10: 15 mg via INTRAVENOUS
  Filled 2017-09-10: qty 1

## 2017-09-10 MED ORDER — HYDROMORPHONE HCL 1 MG/ML IJ SOLN
1.0000 mg | Freq: Once | INTRAMUSCULAR | Status: AC
  Administered 2017-09-10: 1 mg via INTRAVENOUS
  Filled 2017-09-10: qty 1

## 2017-09-10 NOTE — ED Notes (Signed)
Dr. Criss Alvine ( EDP) notified on pt.'s condition .

## 2017-09-10 NOTE — ED Notes (Signed)
Patient transported to CT 

## 2017-09-10 NOTE — ED Triage Notes (Signed)
Patient reports RLQ pain with emesis and diarrhea onset last week , advised by her PCP to go to ER for further evaluation to rule out appendicitis. Denies fever or chills .

## 2017-09-10 NOTE — ED Provider Notes (Signed)
MSE was initiated and I personally evaluated the patient and placed orders (if any) at  8:07 PM on September 10, 2017.  Patient presents with 1 week of right lower quadrant abdominal pain.  On my exam it is more right mid abdomen that is tender.  Low-grade fever here.  Most important test will be a pregnancy test, assuming this is negative I have ordered a CT scan to help rule out appendicitis, which could be retrocecal.  Labs pending.  If pregnancy test is positive, obviously we will have to switch to an ultrasound to help rule out ectopic.  The patient appears stable so that the remainder of the MSE may be completed by another provider.   Pricilla Loveless, MD 09/10/17 2008

## 2017-09-11 MED ORDER — TRAMADOL HCL 50 MG PO TABS: 50.0000 mg | ORAL_TABLET | Freq: Four times a day (QID) | ORAL | 0 refills | Status: DC | PRN

## 2017-09-11 NOTE — ED Provider Notes (Signed)
[facility] EMERGENCY DEPARTMENT Provider Note   CSN: [id number] Arrival date & time: 09/10/17  1941     History   Chief Complaint Chief Complaint  Patient presents with  . Abdominal Pain    R/O Appendicitis    HPI Tabitha Smith is a 26 y.o. female.  HPI   26 year old female with abdominal pain.  Onset about a week ago.  Initially intermittent but has since become constant.  Pain is in the mid to lower abdomen.  Somewhat worse on the right.  Worse with certain movements, but not consistently.  No urinary complaints.  No unusual vaginal bleeding or discharge.  Past Medical History:  Diagnosis Date  . Migraine     There are no active problems to display for this patient.   History reviewed. No pertinent surgical history.  OB History    No data available       Home Medications    Prior to Admission medications   Medication Sig Start Date End Date Taking? Authorizing Provider  acetaminophen (TYLENOL) 500 MG tablet Take 500-1,000 mg every 6 (six) hours as needed by mouth for mild pain or headache.   Yes [provider]  clotrimazole (LOTRIMIN) 1 % cream Apply to affected area 2 times daily Patient not taking: Reported on 09/10/2017 08/03/16   Eustace Moore, MD  mupirocin ointment (BACTROBAN) 2 % Apply 1 application topically 2 (two) times daily. To cracked areas of nipples Patient not taking: Reported on 09/10/2017 08/03/16   Eustace Moore, MD  naproxen (NAPROSYN) 500 MG tablet Take 1 tablet (500 mg total) by mouth 2 (two) times daily. Patient not taking: Reported on 09/10/2017 08/28/16   Preston Fleeting, MD  promethazine (PHENERGAN) 25 MG tablet Take 1 tablet (25 mg total) by mouth every 6 (six) hours as needed for nausea or vomiting. Patient not taking: Reported on 09/10/2017 12/09/16   Tilden Fossa, MD  traMADol (ULTRAM) 50 MG tablet Take 1 tablet (50 mg total) by mouth every 6 (six) hours as needed. Patient not taking: Reported on  09/10/2017 07/27/16   Marlon Pel, PA-C    Family History No family history on file.  Social History Social History   Tobacco Use  . Smoking status: Current Every Day Smoker    Packs/day: 0.25  . Smokeless tobacco: Never Used  Substance Use Topics  . Alcohol use: No  . Drug use: No     Allergies   Amoxicillin and Penicillins   Review of Systems Review of Systems  All systems reviewed and negative, other than as noted in HPI.   Physical Exam Updated Vital Signs BP 104/65   Pulse (!) 59   Temp 99.7 F (37.6 C) (Oral)   Resp 18   LMP 08/27/2017 (Approximate)   SpO2 100%   Physical Exam  Constitutional: She appears well-developed and well-nourished. No distress.  HENT:  Head: Normocephalic and atraumatic.  Eyes: Conjunctivae are normal. Right eye exhibits no discharge. Left eye exhibits no discharge.  Neck: Neck supple.  Cardiovascular: Normal rate, regular rhythm and normal heart sounds. Exam reveals no gallop and no friction rub.  No murmur heard. Pulmonary/Chest: Effort normal and breath sounds normal. No respiratory distress.  Abdominal: Soft. There is tenderness.  Abdomen normal in appearance.  Soft.  No distention.  Periumbilical, suprapubic and right lower quadrant tenderness without rebound or guarding.  No CVA tenderness.  Musculoskeletal: She exhibits no edema or tenderness.  Neurological: She is alert.  Skin: Skin is  warm and dry.  Psychiatric: She has a normal mood and affect. Her behavior is normal. Thought content normal.  Nursing note and vitals reviewed.    ED Treatments / Results  Labs (all labs ordered are listed, but only abnormal results are displayed) Labs Reviewed  COMPREHENSIVE METABOLIC PANEL - Abnormal; Notable for the following components:      Result Value   BUN <5 (*)    All other components within normal limits  CBC - Abnormal; Notable for the following components:   WBC 12.4 (*)    Hemoglobin 11.8 (*)    HCT 35.5 (*)      All other components within normal limits  LIPASE, BLOOD  URINALYSIS, ROUTINE W REFLEX MICROSCOPIC  I-STAT BETA HCG BLOOD, ED ([facility], [facility], [facility] ONLY)  POC URINE PREG, ED    EKG  EKG Interpretation None       Radiology Ct Abdomen Pelvis W Contrast  Result Date: 09/11/2017 CLINICAL DATA:  Acute onset of right lower quadrant abdominal pain. Nausea, vomiting and diarrhea. EXAM: CT ABDOMEN AND PELVIS WITH CONTRAST TECHNIQUE: Multidetector CT imaging of the abdomen and pelvis was performed using the standard protocol following bolus administration of intravenous contrast. Delayed images were performed due to motion artifact on initial axial images. CONTRAST:  ISOVUE-300 IOPAMIDOL (ISOVUE-300) INJECTION 61% COMPARISON:  Abdominal radiograph performed 08/28/2016 FINDINGS: Lower chest: The visualized lung bases are grossly clear. The visualized portions of the mediastinum are unremarkable. Hepatobiliary: The liver is unremarkable in appearance. The gallbladder is unremarkable in appearance. The common bile duct remains normal in caliber. Pancreas: The pancreas is within normal limits. Spleen: The spleen is unremarkable in appearance. Adrenals/Urinary Tract: The adrenal glands are unremarkable in appearance. The kidneys are within normal limits. There is no evidence of hydronephrosis. No renal or ureteral stones are identified. No perinephric stranding is seen. Stomach/Bowel: The stomach is unremarkable in appearance. The small bowel is within normal limits. The appendix is normal in caliber, without evidence of appendicitis. The colon is unremarkable in appearance. Vascular/Lymphatic: The abdominal aorta is unremarkable in appearance, though not well assessed due to motion artifact. The inferior vena cava is grossly unremarkable. No retroperitoneal lymphadenopathy is seen. No pelvic sidewall lymphadenopathy is identified. Reproductive: The bladder is mildly distended and grossly unremarkable. A small  uterine fibroid is noted. The ovaries are relatively symmetric. No suspicious adnexal masses are seen. Other: No additional soft tissue abnormalities are seen. Musculoskeletal: No acute osseous abnormalities are identified. The visualized musculature is unremarkable in appearance. IMPRESSION: 1. No acute abnormality seen within the abdomen or pelvis. 2. Small uterine fibroid incidentally noted. Electronically Signed   By: Roanna Raider M.D.   On: 09/11/2017 00:00    Procedures Procedures (including critical care time)  Medications Ordered in ED Medications  iopamidol (ISOVUE-300) 61 % injection (100 mLs Intravenous Contrast Given 09/10/17 2240)  HYDROmorphone (DILAUDID) injection 1 mg (1 mg Intravenous Given 09/10/17 2328)  ketorolac (TORADOL) 15 MG/ML injection 15 mg (15 mg Intravenous Given 09/10/17 2327)     Initial Impression / Assessment and Plan / ED Course  I have reviewed the triage vital signs and the nursing notes.  Pertinent labs & imaging results that were available during my care of the patient were reviewed by me and considered in my medical decision making (see chart for details).     26 year old female with lower abdominal pain.  Imaging unremarkable.  Patient declines pelvic exam.  She denies any vaginal bleeding or discharge.  UA  looks fine.  Is not pregnant.  She really needs a pelvic exam if she has persistent symptoms.  Return precautions were discussed.  PCP or GYN follow-up otherwise.  Final Clinical Impressions(s) / ED Diagnoses   Final diagnoses:  Abdominal pain, unspecified abdominal location    ED Discharge Orders    None       Raeford RazorKohut, Yusra Ravert, MD 09/11/17 1100

## 2018-08-06 ENCOUNTER — Encounter: Payer: Self-pay | Admitting: Neurology

## 2018-08-06 ENCOUNTER — Ambulatory Visit: Payer: Medicaid Other | Admitting: Neurology

## 2018-08-06 ENCOUNTER — Encounter: Payer: Self-pay | Admitting: *Deleted

## 2018-08-06 ENCOUNTER — Telehealth: Payer: Self-pay | Admitting: Neurology

## 2018-08-06 VITALS — BP 119/75 | HR 78 | Ht 66.0 in | Wt 177.0 lb

## 2018-08-06 DIAGNOSIS — G43711 Chronic migraine without aura, intractable, with status migrainosus: Secondary | ICD-10-CM | POA: Diagnosis not present

## 2018-08-06 DIAGNOSIS — G8929 Other chronic pain: Secondary | ICD-10-CM

## 2018-08-06 DIAGNOSIS — H539 Unspecified visual disturbance: Secondary | ICD-10-CM | POA: Diagnosis not present

## 2018-08-06 DIAGNOSIS — R51 Headache with orthostatic component, not elsewhere classified: Secondary | ICD-10-CM

## 2018-08-06 DIAGNOSIS — R519 Headache, unspecified: Secondary | ICD-10-CM

## 2018-08-06 MED ORDER — ERENUMAB-AOOE 140 MG/ML ~~LOC~~ SOAJ: 140.0000 mg | SUBCUTANEOUS | 11 refills | Status: DC

## 2018-08-06 MED ORDER — ALPRAZOLAM 0.25 MG PO TABS: ORAL_TABLET | ORAL | 0 refills | Status: DC

## 2018-08-06 MED ORDER — ERENUMAB-AOOE 70 MG/ML ~~LOC~~ SOAJ: 70.0000 mg | SUBCUTANEOUS | 0 refills | Status: DC

## 2018-08-06 NOTE — Telephone Encounter (Signed)
medicaid order sent to [facility]. They obtain the auth and will reach out to the pt to schedule.  °

## 2018-08-06 NOTE — Patient Instructions (Addendum)
MRI of the brain    Erenumab: Patient drug information Access [employer] Online here. Copyright [phone] [employer] All rights reserved. (For additional information see "Erenumab: Drug information") Brand Names: [place]  Aimovig  Brand Names: [place]  Aimovig  What is this drug used for?   It is used to prevent migraine headaches.  What do I need to tell my doctor BEFORE I take this drug?   If you have an allergy to this drug or any part of this drug.   If you are allergic to any drugs like this one, any other drugs, foods, or other substances. Tell your doctor about the allergy and what signs you had, like rash; hives; itching; shortness of breath; wheezing; cough; swelling of face, lips, tongue, or throat; or any other signs.   This drug may interact with other drugs or health problems.   Tell your doctor and pharmacist about all of your drugs (prescription or OTC, natural products, vitamins) and health problems. You must check to make sure that it is safe for you to take this drug with all of your drugs and health problems. Do not start, stop, or change the dose of any drug without checking with your doctor.  What are some things I need to know or do while I take this drug?   Tell all of your health care providers that you take this drug. This includes your doctors, nurses, pharmacists, and dentists.   If you have a latex allergy, talk with your doctor.   Tell your doctor if you are pregnant or plan on getting pregnant. You will need to talk about the benefits and risks of using this drug while you are pregnant.   Tell your doctor if you are breast-feeding. You will need to talk about any risks to your baby.  What are some side effects that I need to call my doctor about right away?   WARNING/CAUTION: Even though it may be rare, some people may have very bad and sometimes deadly side effects when taking a drug. Tell your doctor or get medical help right away if you have any of the  following signs or symptoms that may be related to a very bad side effect:   Signs of an allergic reaction, like rash; hives; itching; red, swollen, blistered, or peeling skin with or without fever; wheezing; tightness in the chest or throat; trouble breathing, swallowing, or talking; unusual hoarseness; or swelling of the mouth, face, lips, tongue, or throat.  What are some other side effects of this drug?   All drugs may cause side effects. However, many people have no side effects or only have minor side effects. Call your doctor or get medical help if any of these side effects or any other side effects bother you or do not go away:   Redness or swelling where the shot is given.   Pain where the shot was given.   Constipation.   These are not all of the side effects that may occur. If you have questions about side effects, call your doctor. Call your doctor for medical advice about side effects.   You may report side effects to your national health agency.  How is this drug best taken?   Use this drug as ordered by your doctor. Read all information given to you. Follow all instructions closely.   It is given as a shot into the fatty part of the skin on the top of the thigh, belly area, or upper arm.  If you will be giving yourself the shot, your doctor or nurse will teach you how to give the shot.   Follow how to use as you have been told by the doctor or read the package insert.   If stored in a refrigerator, let this drug come to room temperature before using it. Leave it at room temperature for at least 30 minutes. Do not heat this drug.   Protect from heat and sunlight.   Do not shake.   Do not give into skin that is irritated, bruised, red, infected, or scarred.   Do not use if the solution is cloudy, leaking, or has particles.   Do not use if solution changes color.   Throw away after using. Do not use the device more than 1 time.   Throw away needles in a needle/sharp disposal box.  Do not reuse needles or other items. When the box is full, follow all local rules for getting rid of it. Talk with a doctor or pharmacist if you have any questions.  What do I do if I miss a dose?   Take a missed dose as soon as you think about it.   After taking a missed dose, start a new schedule based on when the dose is taken.  How do I store and/or throw out this drug?   Store in a refrigerator. Do not freeze.   Store in the carton to protect from light.   Do not use if it has been frozen.   If you drop this drug on a hard surface, do not use it.   If needed, you may store at room temperature for up to 7 days. Write down the date you take this drug out of the refrigerator. If stored at room temperature and not used within 7 days, throw this drug away.   Do not put this drug back in the refrigerator after it has been stored at room temperature.   Keep all drugs in a safe place. Keep all drugs out of the reach of children and pets.   Throw away unused or expired drugs. Do not flush down a toilet or pour down a drain unless you are told to do so. Check with your pharmacist if you have questions about the best way to throw out drugs. There may be drug take-back programs in your area.  General drug facts   If your symptoms or health problems do not get better or if they become worse, call your doctor.   Do not share your drugs with others and do not take anyone else's drugs.   Keep a list of all your drugs (prescription, natural products, vitamins, OTC) with you. Give this list to your doctor.   Talk with the doctor before starting any new drug, including prescription or OTC, natural products, or vitamins.   Some drugs may have another patient information leaflet. If you have any questions about this drug, please talk with your doctor, nurse, pharmacist, or other health care provider.   If you think there has been an overdose, call your poison control center or get medical care right away. Be  ready to tell or show what was taken, how much, and when it happened.

## 2018-08-06 NOTE — Telephone Encounter (Signed)
I tried to reach out to the patient to inform her about the MRI order. Her number said it had calling restriction and then I called the other number and the person that answered said I had to the wrong number.

## 2018-08-06 NOTE — Progress Notes (Signed)
[name]    Provider:  Dr Lucia Gaskins Referring Provider: Graylin Shiver, MD Primary Care Physician:  [facility]  CC:  Migraines and dizziness  HPI:  Tabitha Smith is a 27 y.o. female here as requested by Dr. Doran Heater for migraines. She has had them since a child. She missed lots of days of school in HS. Migraines/headaches worsening since having son 2 years ago. Migraines can be unilateral , eye pain, +photo/phonophobia, +nausea and vomiting, has to go into a dark room which helps, can't have any noise. She has 23 headache days a month.  15 migraine days a month. Anxiety and stress makes it worse. 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 She also has intermittent dizziness with and without the headache. She feels like she just got off an elevator. When she lays down she gets a rush of dizziness and headache, positionally worse. She wakes in the morning with headaches, they have woken her up in the middle of the night. She is having numbness on the left side of her face. Not having anymore children. Not sexually active and uses birth control. No aura. No medication overuse.   Medications tried: Sumatriptan, Topiramate, Propranolol, Nortriptyline, Fioricet, she has tried "everything"  Reviewed notes, labs and imaging from outside physicians, which showed:  Reviewed notes from referring physician Dr. Doran Heater.  Patient was seen in ear nose and throat for the evaluation of dizziness.  The problem intermittent.  Worse with moving around and last a few seconds.  Started 3 weeks prior to visit which was July 22, 2018.  Feels like she just got off an elevator, no hearing loss, no vertigo or room spinning, no voice changes she does report tingling in the hands and headaches with pain in the sides of the heads, with headaches there is photophobia and phonophobia, she is has history of migraines in the past.  Antinausea medications did not  help.  Reviewed exam and work-up which was unremarkable.  Patient was scheduled for Dix-Hallpike with lenses and audiometric testing, and referred to neurology suspecting likely migraine disorder.   Review of Systems: Patient complains of symptoms per HPI as well as the following symptoms: dizziness, headaches, insomnia, fatigue. Pertinent negatives and positives per HPI. All others negative.   Social History   Socioeconomic History  . Marital status: Single    Spouse name: Not on file  . Number of children: 1  . Years of education: Not on file  . Highest education level: High school graduate  Occupational History  . Not on file  Social Needs  . Financial resource strain: Not on file  . Food insecurity:    Worry: Not on file    Inability: Not on file  . Transportation needs:    Medical: Not on file    Non-medical: Not on file  Tobacco Use  . Smoking status: Current Every Day Smoker    Packs/day: 0.50    Types: Cigarettes  . Smokeless tobacco: Never Used  Substance and Sexual Activity  . Alcohol use: Yes    Comment: occasionally  . Drug use: Never  . Sexual activity: Not on file  Lifestyle  . Physical activity:    Days per week: Not on file    Minutes per session: Not on file  . Stress: Not on file  Relationships  . Social connections:    Talks on phone: Not on file    Gets together: Not on file  Attends religious service: Not on file    Active member of club or organization: Not on file    Attends meetings of clubs or organizations: Not on file    Relationship status: Not on file  . Intimate partner violence:    Fear of current or ex partner: Not on file    Emotionally abused: Not on file    Physically abused: Not on file    Forced sexual activity: Not on file  Other Topics Concern  . Not on file  Social History Narrative   Lives at home with family   Left handed   Caffeine: coffee 4-5 times weekly    Family History  Problem Relation Age of Onset  .  Diabetes Mother   . Seizures Mother     Past Medical History:  Diagnosis Date  . Migraine     Past Surgical History:  Procedure Laterality Date  . NO PAST SURGERIES      Current Outpatient Medications  Medication Sig Dispense Refill  . zolpidem (AMBIEN) 10 MG tablet Take 10 mg by mouth at bedtime.    . ALPRAZolam (XANAX) 0.25 MG tablet Take 1-2 tabs (0.25mg -0.50mg ) 30-60 minutes before procedure. May repeat if needed.Do not drive. 4 tablet 0  . Erenumab-aooe (AIMOVIG) 140 MG/ML SOAJ Inject 140 mg into the skin every 30 (thirty) days. 1 pen 11  . Erenumab-aooe (AIMOVIG) 70 MG/ML SOAJ Inject 70 mg into the skin every 30 (thirty) days. 1 pen 0   No current facility-administered medications for this visit.     Allergies as of 08/06/2018 - Review Complete 08/06/2018  Allergen Reaction Noted  . Amoxicillin Anaphylaxis 09/10/2017  . Penicillins Anaphylaxis 06/19/2016    Vitals: BP 119/75 (BP Location: Right Arm, Patient Position: Sitting)   Pulse 78   Ht 5\' 6"  (1.676 m)   Wt 177 lb (80.3 kg)   LMP 08/03/2018 (Exact Date)   BMI 28.57 kg/m  Last Weight:  Wt Readings from Last 1 Encounters:  08/06/18 177 lb (80.3 kg)   Last Height:   Ht Readings from Last 1 Encounters:  08/06/18 5\' 6"  (1.676 m)   Physical exam: Exam: Gen: NAD, conversant, well nourised, well groomed                     CV: RRR, no MRG. No Carotid Bruits. No peripheral edema, warm, nontender Eyes: Conjunctivae clear without exudates or hemorrhage  Neuro: Detailed Neurologic Exam  Speech:    Speech is normal; fluent and spontaneous with normal comprehension.  Cognition:    The patient is oriented to person, place, and time;     recent and remote memory intact;     language fluent;     normal attention, concentration,     fund of knowledge Cranial Nerves:    The pupils are equal, round, and reactive to light. The fundi are normal and spontaneous venous pulsations are present. Visual fields are full  to finger confrontation. Extraocular movements are intact. Trigeminal sensation is intact and the muscles of mastication are normal. The face is symmetric. The palate elevates in the midline. Hearing intact. Voice is normal. Shoulder shrug is normal. The tongue has normal motion without fasciculations.   Coordination:    Normal finger to nose and heel to shin. Normal rapid alternating movements.   Gait:    Heel-toe and tandem gait are normal.   Motor Observation:    No asymmetry, no atrophy, and no involuntary movements noted. Tone:  Normal muscle tone.    Posture:    Posture is normal. normal erect    Strength:    Strength is V/V in the upper and lower limbs.      Sensation: intact to LT     Reflex Exam:  DTR's:    Deep tendon reflexes in the upper and lower extremities are symmetrical bilaterally.   Toes:    The toes are downgoing bilaterally.   Clonus:    Clonus is absent.       Assessment/Plan:  27 year old with chronic intractable headaches. Most likely Chronic Migraines but needs workup for other etiologies.    - MRI brain due to concerning symptoms of morning headaches, positional headaches,vision changes  to look for space occupying mass, chiari or intracranial hypertension (pseudotumor).  - Start Aimovig. She declines Botox for migraines. Do not get pregnant.   Orders Placed This Encounter  Procedures  . MR BRAIN W WO CONTRAST  . Comprehensive metabolic panel  . CBC  . TSH   Meds ordered this encounter  Medications  . ALPRAZolam (XANAX) 0.25 MG tablet    Sig: Take 1-2 tabs (0.25mg -0.50mg ) 30-60 minutes before procedure. May repeat if needed.Do not drive.    Dispense:  4 tablet    Refill:  0  . Erenumab-aooe (AIMOVIG) 140 MG/ML SOAJ    Sig: Inject 140 mg into the skin every 30 (thirty) days.    Dispense:  1 pen    Refill:  11  . Erenumab-aooe (AIMOVIG) 70 MG/ML SOAJ    Sig: Inject 70 mg into the skin every 30 (thirty) days.    Dispense:  1 pen     Refill:  0    Lot: [id number] Exp: 12/20    Naomie Dean, MD  [facility] [address] [place], [place] [address]  Phone [phone] Fax [phone]

## 2018-08-07 LAB — COMPREHENSIVE METABOLIC PANEL
A/G RATIO: 1.9 (ref 1.2–2.2)
ALK PHOS: 47 IU/L (ref 39–117)
ALT: 18 IU/L (ref 0–32)
AST: 21 IU/L (ref 0–40)
Albumin: 4.3 g/dL (ref 3.5–5.5)
BUN/Creatinine Ratio: 6 — ABNORMAL LOW (ref 9–23)
BUN: 6 mg/dL (ref 6–20)
CALCIUM: 8.9 mg/dL (ref 8.7–10.2)
CO2: 21 mmol/L (ref 20–29)
Chloride: 105 mmol/L (ref 96–106)
Creatinine, Ser: 0.98 mg/dL (ref 0.57–1.00)
GFR calc Af Amer: 90 mL/min/{1.73_m2} (ref >59)
GFR, EST NON AFRICAN AMERICAN: 78 mL/min/{1.73_m2} (ref >59)
GLOBULIN, TOTAL: 2.3 g/dL (ref 1.5–4.5)
Glucose: 82 mg/dL (ref 65–99)
POTASSIUM: 4.1 mmol/L (ref 3.5–5.2)
SODIUM: 140 mmol/L (ref 134–144)
Total Protein: 6.6 g/dL (ref 6.0–8.5)

## 2018-08-07 LAB — CBC
Hematocrit: 34.5 % (ref 34.0–46.6)
Hemoglobin: 11.4 g/dL (ref 11.1–15.9)
MCH: 28.1 pg (ref 26.6–33.0)
MCHC: 33 g/dL (ref 31.5–35.7)
MCV: 85 fL (ref 79–97)
PLATELETS: 258 10*3/uL (ref 150–450)
RBC: 4.06 x10E6/uL (ref 3.77–5.28)
RDW: 13.3 % (ref 12.3–15.4)
WBC: 5.4 10*3/uL (ref 3.4–10.8)

## 2018-08-07 LAB — TSH: TSH: 1.5 u[IU]/mL (ref 0.450–4.500)

## 2018-08-07 NOTE — Telephone Encounter (Signed)
Dola Factor with GI have also tried to reach out to the patient and the numbers aren't working.

## 2018-08-09 ENCOUNTER — Ambulatory Visit (HOSPITAL_COMMUNITY)
Admission: EM | Admit: 2018-08-09 | Discharge: 2018-08-09 | Disposition: A | Discharge status: Home / Self Care | Payer: Medicaid Other | Attending: Family Medicine | Admitting: Family Medicine

## 2018-08-09 ENCOUNTER — Encounter (HOSPITAL_COMMUNITY): Payer: Self-pay | Admitting: Emergency Medicine

## 2018-08-09 DIAGNOSIS — F17210 Nicotine dependence, cigarettes, uncomplicated: Secondary | ICD-10-CM | POA: Diagnosis not present

## 2018-08-09 DIAGNOSIS — J029 Acute pharyngitis, unspecified: Secondary | ICD-10-CM | POA: Insufficient documentation

## 2018-08-09 DIAGNOSIS — Z880 Allergy status to penicillin: Secondary | ICD-10-CM | POA: Diagnosis not present

## 2018-08-09 DIAGNOSIS — Z79899 Other long term (current) drug therapy: Secondary | ICD-10-CM | POA: Insufficient documentation

## 2018-08-09 LAB — POCT RAPID STREP A: Streptococcus, Group A Screen (Direct): NEGATIVE

## 2018-08-09 MED ORDER — AZITHROMYCIN 250 MG PO TABS: 250.0000 mg | ORAL_TABLET | Freq: Every day | ORAL | 0 refills | Status: DC

## 2018-08-09 MED ORDER — IPRATROPIUM BROMIDE 0.06 % NA SOLN: 2.0000 | Freq: Four times a day (QID) | NASAL | 0 refills | Status: DC

## 2018-08-09 MED ORDER — PHENOL 1.4 % MT LIQD: 1.0000 | OROMUCOSAL | 0 refills | Status: DC | PRN

## 2018-08-09 NOTE — ED Triage Notes (Signed)
Pt here for sore throat

## 2018-08-09 NOTE — ED Provider Notes (Signed)
[facility]    CSN: [id number] Arrival date & time: 08/09/18  1901     History   Chief Complaint Chief Complaint  Patient presents with  . Sore Throat    HPI Tabitha Smith is a 27 y.o. female.   27 year old female comes in for 4-day history of sore throat.  3 days ago, started having hoarseness to the voice.  Denies cough, rhinorrhea, nasal congestion.  Unknown fever however with chills.  Has had trouble eating due to painful swallowing.  However, without trouble breathing, swelling of the throat, tripoding, drooling, trismus.  Has tried multiple over-the-counter cold medication without relief.  No obvious sick contact.     Past Medical History:  Diagnosis Date  . Migraine     Patient Active Problem List   Diagnosis Date Noted  . Chronic migraine without aura, with intractable migraine, so stated, with status migrainosus 08/06/2018    Past Surgical History:  Procedure Laterality Date  . NO PAST SURGERIES      OB History   None      Home Medications    Prior to Admission medications   Medication Sig Start Date End Date Taking? Authorizing Provider  ALPRAZolam (XANAX) 0.25 MG tablet Take 1-2 tabs (0.25mg -0.50mg ) 30-60 minutes before procedure. May repeat if needed.Do not drive. 08/06/18   Anson Fret, MD  azithromycin (ZITHROMAX) 250 MG tablet Take 1 tablet (250 mg total) by mouth daily. Take first 2 tablets together, then 1 every day until finished. 08/09/18   Cathie Hoops, Jesalyn Finazzo V, PA-C  Erenumab-aooe (AIMOVIG) 140 MG/ML SOAJ Inject 140 mg into the skin every 30 (thirty) days. 08/06/18   Anson Fret, MD  Erenumab-aooe (AIMOVIG) 70 MG/ML SOAJ Inject 70 mg into the skin every 30 (thirty) days. 08/06/18   Anson Fret, MD  ipratropium (ATROVENT) 0.06 % nasal spray Place 2 sprays into both nostrils 4 (four) times daily. 08/09/18   Cathie Hoops, Leilene Diprima V, PA-C  phenol (CHLORASEPTIC) 1.4 % LIQD Use as directed 1 spray in the mouth or throat as needed for throat  irritation / pain. 08/09/18   Cathie Hoops, Ai Sonnenfeld V, PA-C  zolpidem (AMBIEN) 10 MG tablet Take 10 mg by mouth at bedtime.    [provider]    Family History Family History  Problem Relation Age of Onset  . Diabetes Mother   . Seizures Mother     Social History Social History   Tobacco Use  . Smoking status: Current Every Day Smoker    Packs/day: 0.50    Types: Cigarettes  . Smokeless tobacco: Never Used  Substance Use Topics  . Alcohol use: Yes    Comment: occasionally  . Drug use: Never     Allergies   Amoxicillin and Penicillins   Review of Systems Review of Systems  Reason unable to perform ROS: See HPI as above.     Physical Exam Triage Vital Signs ED Triage Vitals  Enc Vitals Group     BP 08/09/18 1919 128/69     Pulse Rate 08/09/18 1919 87     Resp 08/09/18 1919 18     Temp 08/09/18 1919 100.1 F (37.8 C)     Temp Source 08/09/18 1919 Oral     SpO2 08/09/18 1919 100 %     Weight --      Height --      Head Circumference --      Peak Flow --      Pain Score 08/09/18 1920 10  Pain Loc --      Pain Edu? --      Excl. in GC? --    No data found.  Updated Vital Signs BP 128/69 (BP Location: Left Arm)   Pulse 87   Temp 100.1 F (37.8 C) (Oral)   Resp 18   LMP 08/03/2018 (Exact Date)   SpO2 100%   Physical Exam  Constitutional: She is oriented to person, place, and time. She appears well-developed and well-nourished.  Non-toxic appearance. She does not appear ill. No distress.  HENT:  Head: Normocephalic and atraumatic.  Right Ear: Tympanic membrane, external ear and ear canal normal. Tympanic membrane is not erythematous and not bulging.  Left Ear: Tympanic membrane, external ear and ear canal normal. Tympanic membrane is not erythematous and not bulging.  Nose: Nose normal. Right sinus exhibits no maxillary sinus tenderness and no frontal sinus tenderness. Left sinus exhibits no maxillary sinus tenderness and no frontal sinus tenderness.    Mouth/Throat: Uvula is midline, oropharynx is clear and moist and mucous membranes are normal. Tonsils are 1+ on the right. Tonsils are 2+ on the left. Tonsillar exudate (left).  Eyes: Pupils are equal, round, and reactive to light. Conjunctivae are normal.  Neck: Normal range of motion. Neck supple.  Cardiovascular: Normal rate, regular rhythm and normal heart sounds. Exam reveals no gallop and no friction rub.  No murmur heard. Pulmonary/Chest: Effort normal and breath sounds normal. She has no decreased breath sounds. She has no wheezes. She has no rhonchi. She has no rales.  Lymphadenopathy:    She has no cervical adenopathy.  Neurological: She is alert and oriented to person, place, and time.  Skin: Skin is warm and dry.  Psychiatric: She has a normal mood and affect. Her behavior is normal. Judgment normal.     UC Treatments / Results  Labs (all labs ordered are listed, but only abnormal results are displayed) Labs Reviewed  CULTURE, GROUP A STREP [facility])  POCT RAPID STREP A    EKG None  Radiology No results found.  Procedures Procedures (including critical care time)  Medications Ordered in UC Medications - No data to display  Initial Impression / Assessment and Plan / UC Course  I have reviewed the triage vital signs and the nursing notes.  Pertinent labs & imaging results that were available during my care of the patient were reviewed by me and considered in my medical decision making (see chart for details).    Rapid strep negative.  However, given history and exam, will treat empirically for strep with azithromycin.  Other symptomatic treatment discussed.  Return precautions given.  Patient expresses understanding and agrees to plan.  Final Clinical Impressions(s) / UC Diagnoses   Final diagnoses:  Pharyngitis, unspecified etiology    ED Prescriptions    Medication Sig Dispense Auth. Provider   azithromycin (ZITHROMAX) 250 MG tablet Take 1 tablet (250 mg  total) by mouth daily. Take first 2 tablets together, then 1 every day until finished. 6 tablet Loc Feinstein V, PA-C   phenol (CHLORASEPTIC) 1.4 % LIQD Use as directed 1 spray in the mouth or throat as needed for throat irritation / pain. 1 Bottle Yesha Muchow V, PA-C   ipratropium (ATROVENT) 0.06 % nasal spray Place 2 sprays into both nostrils 4 (four) times daily. 15 mL Threasa Alpha, [place] 08/09/18 [phone]

## 2018-08-09 NOTE — Discharge Instructions (Signed)
Rapid strep negative. However, given history and exam, will cover for strep with azithromycin. As discussed, symptoms could still be due to viral illness, in that case, your body will take care of it. Start phenol throat spray for sore throat. Atrovent for nasal congestion/drainage. You can use over the counter nasal saline rinse such as neti pot for nasal congestion. Monitor for any worsening of symptoms, swelling of the throat, trouble breathing, trouble swallowing, leaning forward to breath, drooling, go to the emergency department for further evaluation needed.  For sore throat/cough try using a honey-based tea. Use 3 teaspoons of honey with juice squeezed from half lemon. Place shaved pieces of ginger into 1/2-1 cup of water and warm over stove top. Then mix the ingredients and repeat every 4 hours as needed.

## 2018-08-12 LAB — CULTURE, GROUP A STREP (THRC)

## 2018-08-22 ENCOUNTER — Other Ambulatory Visit: Payer: Medicaid Other

## 2018-08-24 ENCOUNTER — Ambulatory Visit
Admission: RE | Admit: 2018-08-24 | Discharge: 2018-08-24 | Disposition: A | Discharge status: Home / Self Care | Payer: Medicaid Other | Source: Ambulatory Visit | Attending: Neurology | Admitting: Neurology

## 2018-08-24 DIAGNOSIS — R51 Headache with orthostatic component, not elsewhere classified: Secondary | ICD-10-CM

## 2018-08-24 DIAGNOSIS — G8929 Other chronic pain: Secondary | ICD-10-CM

## 2018-08-24 DIAGNOSIS — R519 Headache, unspecified: Secondary | ICD-10-CM

## 2018-08-24 DIAGNOSIS — H539 Unspecified visual disturbance: Secondary | ICD-10-CM

## 2018-08-24 MED ORDER — GADOBENATE DIMEGLUMINE 529 MG/ML IV SOLN
15.0000 mL | Freq: Once | INTRAVENOUS | Status: AC | PRN
  Administered 2018-08-24: 15 mL via INTRAVENOUS

## 2018-08-27 NOTE — Telephone Encounter (Signed)
Medicaid Berkley Harvey: [id number] (exp. 08/21/18 to 09/20/18) patient had images done at GI on 08/24/18

## 2018-09-11 ENCOUNTER — Telehealth: Payer: Self-pay | Admitting: Neurology

## 2018-09-11 NOTE — Telephone Encounter (Signed)
Pt has had a 4 day migraine, she is aware of there being medications Dr Lucia Gaskins doesn't want her to take but she is very much in need of relief and is asking for a call with a suggestion.  Pt states she is on her Aimovig but still in need of relief

## 2018-09-11 NOTE — Telephone Encounter (Signed)
I would giver her a steroid taper and also prescribe a triptan acutely like Maxalt for when she has migraines. If the steroid taper doesn't work then caome to the office. Does she have a follow up with me?

## 2018-09-12 ENCOUNTER — Other Ambulatory Visit: Payer: Self-pay | Admitting: *Deleted

## 2018-09-12 ENCOUNTER — Other Ambulatory Visit: Payer: Self-pay | Admitting: Neurology

## 2018-09-12 MED ORDER — ZOLMITRIPTAN 5 MG NA SOLN: 1.0000 | NASAL | 11 refills | Status: DC | PRN

## 2018-09-12 MED ORDER — METHYLPREDNISOLONE 4 MG PO TBPK: ORAL_TABLET | ORAL | 0 refills | Status: DC

## 2018-09-12 NOTE — Telephone Encounter (Signed)
Called pt's number on file which is her mother's phone. LVM asking for call back from patient.

## 2018-09-12 NOTE — Telephone Encounter (Signed)
Spoke with patient. Denied any new symptoms or neuro changes, typical migraine. Discussed Dr. Trevor Mace offer of a medrol dose pack. Pt agreed. Reviewed potential common side effects including but not limited to muscle weakness. Advised her to review information included with medication. Informed her to follow the pack instructions for each day but she can each day's pills each morning with food at once instead of spacing them out each day. Pt advised to please call or ask pharmacy any questions that may arise. Pt verbalized appreciation. She stated that she had taken Rizatriptan and Sumatriptan before and they did not work for her. She is interested in another acute medication if Dr. Lucia Gaskins has any suggestions. She is not taking OTC meds like BC powders, tylenol, etc per instructions by Dr. Lucia Gaskins. Pt appreciative for the call.

## 2018-09-12 NOTE — Telephone Encounter (Signed)
We can try a nasal spray zomig I will call it in and see if it is approved.

## 2018-09-12 NOTE — Addendum Note (Signed)
Addended by: Bertram Savin on: 09/12/2018 11:10 AM   Modules accepted: Orders

## 2018-09-16 NOTE — Telephone Encounter (Signed)
RN spoke with pt and discussed the Zomig 5 mg nasal spray that Dr. Lucia Gaskins ordered. Reviewed instructions, 1 spray per day as needed for migraine. Max 6 per month. Advised pt of potential side effects including nausea, dizziness, heavy, tight feeling. Advised that this is not a full list and to please take a look at the information included with the Zomig that contains more. Pt aware a PA will be needed and we are unsure if her insurance will approve. Pt stated that she had tried triptan tablets prior but no injections. Pt's questions were answered. She stated that the steroid dose pack is helping. Still has a headache but she can focus now. She is aware to finish the pack as long as tolerated as she still has a few days. She was encouraged to call back with any questions. She verbalized understanding and appreciation.

## 2018-11-10 ENCOUNTER — Emergency Department (HOSPITAL_COMMUNITY)
Admission: EM | Admit: 2018-11-10 | Discharge: 2018-11-10 | Disposition: A | Discharge status: Home / Self Care | Payer: Medicaid Other | Attending: Emergency Medicine | Admitting: Emergency Medicine

## 2018-11-10 ENCOUNTER — Other Ambulatory Visit: Payer: Self-pay

## 2018-11-10 ENCOUNTER — Encounter (HOSPITAL_COMMUNITY): Payer: Self-pay

## 2018-11-10 DIAGNOSIS — F17210 Nicotine dependence, cigarettes, uncomplicated: Secondary | ICD-10-CM | POA: Diagnosis not present

## 2018-11-10 DIAGNOSIS — Y998 Other external cause status: Secondary | ICD-10-CM | POA: Insufficient documentation

## 2018-11-10 DIAGNOSIS — Z79899 Other long term (current) drug therapy: Secondary | ICD-10-CM | POA: Insufficient documentation

## 2018-11-10 DIAGNOSIS — Y9389 Activity, other specified: Secondary | ICD-10-CM | POA: Diagnosis not present

## 2018-11-10 DIAGNOSIS — W260XXA Contact with knife, initial encounter: Secondary | ICD-10-CM | POA: Diagnosis not present

## 2018-11-10 DIAGNOSIS — Y929 Unspecified place or not applicable: Secondary | ICD-10-CM | POA: Insufficient documentation

## 2018-11-10 DIAGNOSIS — S31123A Laceration of abdominal wall with foreign body, right lower quadrant without penetration into peritoneal cavity, initial encounter: Secondary | ICD-10-CM | POA: Insufficient documentation

## 2018-11-10 DIAGNOSIS — T148XXA Other injury of unspecified body region, initial encounter: Secondary | ICD-10-CM

## 2018-11-10 MED ORDER — LIDOCAINE-EPINEPHRINE (PF) 2 %-1:200000 IJ SOLN
10.0000 mL | Freq: Once | INTRAMUSCULAR | Status: DC
  Filled 2018-11-10: qty 20

## 2018-11-10 NOTE — ED Notes (Signed)
ED Provider at bedside. 

## 2018-11-10 NOTE — ED Triage Notes (Signed)
Pt was cutting open a box on her hip, whne she couldn't get it to open, accidentally cutting her abdomen on the right side. Last tetanus approx 5 years ago

## 2018-11-10 NOTE — Discharge Instructions (Signed)
Wound Care - Dermabond  Your wound has been closed with a medical-grade glue called Dermabond. Please adhere to the following wound care instructions:  You may shower, but avoid submerging the wound. Do not scrub the wound, as this may cause the glue to wear off prematurely.    The glue will wear off on its own, usually within 5-10 days. During this time period DO NOT apply antibiotic ointments or any other ointments or lotions to the area as this can cause the glue to wear off prematurely.  After 10 days, you may apply ointments, such as Neosporin, to the area to help the remaining glue to wear off.   After the wound has healed and the glue is gone, you may apply ointments such as Aquaphor to the wound to reduce scarring.  May use ibuprofen, naproxen, or Tylenol for pain.  Return to the ED should the wound edges come apart or signs of infection arise, such as spreading redness, puffiness/swelling, pus draining from the wound, severe increase in pain, abdominal pain, vomiting, or any other major issues.  For prescription assistance, may try using prescription discount sites or apps, such as goodrx.com

## 2018-11-10 NOTE — ED Provider Notes (Signed)
[facility]-EMERGENCY DEPT Provider Note   CSN: [id number] Arrival date & time: 11/10/18  1318     History   Chief Complaint Chief Complaint  Patient presents with  . Laceration    HPI Tabitha Smith is a 28 y.o. female.  HPI   Tabitha Smith is a 28 y.o. female, with a history of migraine, presenting to the ED with a laceration to the abdomen that occurred shortly prior to arrival. States she was using a box cutter to open a box, cutting towards herself, and accidentally cut her abdomen. Tetanus updated 5 years ago.  Denies abdominal pain outside the region of the laceration, numbness, other injuries, or any other complaints.     Past Medical History:  Diagnosis Date  . Migraine     Patient Active Problem List   Diagnosis Date Noted  . Chronic migraine without aura, with intractable migraine, so stated, with status migrainosus 08/06/2018    Past Surgical History:  Procedure Laterality Date  . NO PAST SURGERIES       OB History   No obstetric history on file.      Home Medications    Prior to Admission medications   Medication Sig Start Date End Date Taking? Authorizing Provider  ALPRAZolam (XANAX) 0.25 MG tablet Take 1-2 tabs (0.25mg -0.50mg ) 30-60 minutes before procedure. May repeat if needed.Do not drive. 08/06/18   Anson FretAhern, Antonia B, MD  azithromycin (ZITHROMAX) 250 MG tablet Take 1 tablet (250 mg total) by mouth daily. Take first 2 tablets together, then 1 every day until finished. 08/09/18   Cathie HoopsYu, Amy V, PA-C  Erenumab-aooe (AIMOVIG) 140 MG/ML SOAJ Inject 140 mg into the skin every 30 (thirty) days. 08/06/18   Anson FretAhern, Antonia B, MD  Erenumab-aooe (AIMOVIG) 70 MG/ML SOAJ Inject 70 mg into the skin every 30 (thirty) days. 08/06/18   Anson FretAhern, Antonia B, MD  ipratropium (ATROVENT) 0.06 % nasal spray Place 2 sprays into both nostrils 4 (four) times daily. 08/09/18   Cathie HoopsYu, Amy V, PA-C  methylPREDNISolone (MEDROL DOSEPAK) 4 MG TBPK tablet Follow  pack instructions, but may take the daily pills at once each morning with food. 09/12/18   Anson FretAhern, Antonia B, MD  phenol (CHLORASEPTIC) 1.4 % LIQD Use as directed 1 spray in the mouth or throat as needed for throat irritation / pain. 08/09/18   Cathie HoopsYu, Amy V, PA-C  zolmitriptan (ZOMIG) 5 MG nasal solution Place 1 spray into the nose as needed for migraine. 09/12/18   Anson FretAhern, Antonia B, MD  zolpidem (AMBIEN) 10 MG tablet Take 10 mg by mouth at bedtime.    [provider]    Family History Family History  Problem Relation Age of Onset  . Diabetes Mother   . Seizures Mother     Social History Social History   Tobacco Use  . Smoking status: Current Every Day Smoker    Packs/day: 0.50    Types: Cigarettes  . Smokeless tobacco: Never Used  Substance Use Topics  . Alcohol use: Yes    Comment: occasionally  . Drug use: Never     Allergies   Amoxicillin and Penicillins   Review of Systems Review of Systems  Gastrointestinal: Negative for abdominal pain.  Skin: Positive for wound.  Neurological: Negative for numbness.     Physical Exam Updated Vital Signs BP 125/71 (BP Location: Left Arm)   Pulse 74   Temp 98.4 F (36.9 C) (Oral)   Resp 16   Ht 5\' 5"  (1.651 m)  Wt 73 kg   LMP 10/30/2018   SpO2 100%   BMI 26.79 kg/m   Physical Exam Vitals signs and nursing note reviewed.  Constitutional:      General: She is not in acute distress.    Appearance: She is well-developed. She is not diaphoretic.  HENT:     Head: Normocephalic and atraumatic.     Mouth/Throat:     Mouth: Mucous membranes are moist.     Pharynx: Oropharynx is clear.  Eyes:     Conjunctiva/sclera: Conjunctivae normal.  Neck:     Musculoskeletal: Neck supple.  Cardiovascular:     Rate and Rhythm: Normal rate and regular rhythm.     Pulses: Normal pulses.  Pulmonary:     Effort: Pulmonary effort is normal. No respiratory distress.  Abdominal:     Palpations: Abdomen is soft.     Tenderness:  There is no abdominal tenderness. There is no guarding.     Comments: 2 cm linear laceration to the right of the umbilicus.  Bottom of the wound is well visualized.  No adipose tissue noted.  No noted entry into the peritoneal space.  Hemorrhage controlled prior to arrival.  Musculoskeletal:     Right lower leg: No edema.     Left lower leg: No edema.  Lymphadenopathy:     Cervical: No cervical adenopathy.  Skin:    General: Skin is warm and dry.  Neurological:     Mental Status: She is alert.  Psychiatric:        Mood and Affect: Mood and affect normal.        Speech: Speech normal.        Behavior: Behavior normal.          ED Treatments / Results  Labs (all labs ordered are listed, but only abnormal results are displayed) Labs Reviewed - No data to display  EKG None  Radiology No results found.  Procedures .Marland KitchenLaceration Repair Date/Time: 11/10/2018 2:08 PM Performed by: Anselm Pancoast, PA-C Authorized by: Anselm Pancoast, PA-C   Consent:    Consent obtained:  Verbal   Consent given by:  Patient   Risks discussed:  Infection, need for additional repair, poor cosmetic result, pain and poor wound healing Anesthesia (see MAR for exact dosages):    Anesthesia method:  Local infiltration   Local anesthetic:  Lidocaine 2% WITH epi Laceration details:    Location:  Trunk   Trunk location:  RLQ abd (right of umbilicus)   Length (cm):  2 Repair type:    Repair type:  Intermediate Pre-procedure details:    Preparation:  Patient was prepped and draped in usual sterile fashion Exploration:    Hemostasis achieved with:  Epinephrine   Wound exploration: entire depth of wound probed and visualized   Treatment:    Area cleansed with:  Betadine and saline   Amount of cleaning:  Standard   Irrigation solution:  Sterile saline   Irrigation method:  Syringe Subcutaneous repair:    Suture material:  Chromic gut   Suture technique:  Running Skin repair:    Repair method:   Tissue adhesive Approximation:    Approximation:  Close Post-procedure details:    Dressing:  Open (no dressing)   Patient tolerance of procedure:  Tolerated well, no immediate complications   (including critical care time)  Medications Ordered in ED Medications  lidocaine-EPINEPHrine (XYLOCAINE W/EPI) 2 %-1:200000 (PF) injection 10 mL (has no administration in time range)  Initial Impression / Assessment and Plan / ED Course  I have reviewed the triage vital signs and the nursing notes.  Pertinent labs & imaging results that were available during my care of the patient were reviewed by me and considered in my medical decision making (see chart for details).     Patient presents with laceration to the abdomen.  Full depth of wound visualized.  Serial abdominal exams benign. The patient was given instructions for home care as well as return precautions. Patient voices understanding of these instructions, accepts the plan, and is comfortable with discharge.  Findings and plan of care discussed with Pricilla LovelessScott Goldston, MD.     Final Clinical Impressions(s) / ED Diagnoses   Final diagnoses:  Superficial laceration of skin    ED Discharge Orders    None       Concepcion LivingJoy, Ysabelle Goodroe C, PA-C 11/10/18 1439    Pricilla LovelessGoldston, Scott, MD 11/10/18 1637

## 2018-11-19 ENCOUNTER — Encounter: Payer: Self-pay | Admitting: Neurology

## 2018-11-19 ENCOUNTER — Ambulatory Visit: Payer: Medicaid Other | Admitting: Neurology

## 2018-11-19 VITALS — BP 104/61 | HR 65 | Ht 66.0 in | Wt 164.0 lb

## 2018-11-19 DIAGNOSIS — J320 Chronic maxillary sinusitis: Secondary | ICD-10-CM | POA: Diagnosis not present

## 2018-11-19 DIAGNOSIS — G43711 Chronic migraine without aura, intractable, with status migrainosus: Secondary | ICD-10-CM

## 2018-11-19 MED ORDER — SUMATRIPTAN SUCCINATE 6 MG/0.5ML ~~LOC~~ SOLN: SUBCUTANEOUS | 6 refills | Status: DC

## 2018-11-19 NOTE — Patient Instructions (Addendum)
Dr. Allayne Stack office will call you  Sumatriptan injections when you do have a migraine.  Sinusitis, Adult Sinusitis is inflammation of your sinuses. Sinuses are hollow spaces in the bones around your face. Your sinuses are located:  Around your eyes.  In the middle of your forehead.  Behind your nose.  In your cheekbones. Mucus normally drains out of your sinuses. When your nasal tissues become inflamed or swollen, mucus can become trapped or blocked. This allows bacteria, viruses, and fungi to grow, which leads to infection. Most infections of the sinuses are caused by a virus. Sinusitis can develop quickly. It can last for up to 4 weeks (acute) or for more than 12 weeks (chronic). Sinusitis often develops after a cold. What are the causes? This condition is caused by anything that creates swelling in the sinuses or stops mucus from draining. This includes:  Allergies.  Asthma.  Infection from bacteria or viruses.  Deformities or blockages in your nose or sinuses.  Abnormal growths in the nose (nasal polyps).  Pollutants, such as chemicals or irritants in the air.  Infection from fungi (rare). What increases the risk? You are more likely to develop this condition if you:  Have a weak body defense system (immune system).  Do a lot of swimming or diving.  Overuse nasal sprays.  Smoke. What are the signs or symptoms? The main symptoms of this condition are pain and a feeling of pressure around the affected sinuses. Other symptoms include:  Stuffy nose or congestion.  Thick drainage from your nose.  Swelling and warmth over the affected sinuses.  Headache.  Upper toothache.  A cough that may get worse at night.  Extra mucus that collects in the throat or the back of the nose (postnasal drip).  Decreased sense of smell and taste.  Fatigue.  A fever.  Sore throat.  Bad breath. How is this diagnosed? This condition is diagnosed based on:  Your  symptoms.  Your medical history.  A physical exam.  Tests to find out if your condition is acute or chronic. This may include: ? Checking your nose for nasal polyps. ? Viewing your sinuses using a device that has a light (endoscope). ? Testing for allergies or bacteria. ? Imaging tests, such as an MRI or CT scan. In rare cases, a bone biopsy may be done to rule out more serious types of fungal sinus disease. How is this treated? Treatment for sinusitis depends on the cause and whether your condition is chronic or acute.  If caused by a virus, your symptoms should go away on their own within 10 days. You may be given medicines to relieve symptoms. They include: ? Medicines that shrink swollen nasal passages (topical intranasal decongestants). ? Medicines that treat allergies (antihistamines). ? A spray that eases inflammation of the nostrils (topical intranasal corticosteroids). ? Rinses that help get rid of thick mucus in your nose (nasal saline washes).  If caused by bacteria, your health care provider may recommend waiting to see if your symptoms improve. Most bacterial infections will get better without antibiotic medicine. You may be given antibiotics if you have: ? A severe infection. ? A weak immune system.  If caused by narrow nasal passages or nasal polyps, you may need to have surgery. Follow these instructions at home: Medicines  Take, use, or apply over-the-counter and prescription medicines only as told by your health care provider. These may include nasal sprays.  If you were prescribed an antibiotic medicine, take it as  told by your health care provider. Do not stop taking the antibiotic even if you start to feel better. Hydrate and humidify   Drink enough fluid to keep your urine pale yellow. Staying hydrated will help to thin your mucus.  Use a cool mist humidifier to keep the humidity level in your home above 50%.  Inhale steam for 10-15 minutes, 3-4 times a  day, or as told by your health care provider. You can do this in the bathroom while a hot shower is running.  Limit your exposure to cool or dry air. Rest  Rest as much as possible.  Sleep with your head raised (elevated).  Make sure you get enough sleep each night. General instructions   Apply a warm, moist washcloth to your face 3-4 times a day or as told by your health care provider. This will help with discomfort.  Wash your hands often with soap and water to reduce your exposure to germs. If soap and water are not available, use hand sanitizer.  Do not smoke. Avoid being around people who are smoking (secondhand smoke).  Keep all follow-up visits as told by your health care provider. This is important. Contact a health care provider if:  You have a fever.  Your symptoms get worse.  Your symptoms do not improve within 10 days. Get help right away if:  You have a severe headache.  You have persistent vomiting.  You have severe pain or swelling around your face or eyes.  You have vision problems.  You develop confusion.  Your neck is stiff.  You have trouble breathing. Summary  Sinusitis is soreness and inflammation of your sinuses. Sinuses are hollow spaces in the bones around your face.  This condition is caused by nasal tissues that become inflamed or swollen. The swelling traps or blocks the flow of mucus. This allows bacteria, viruses, and fungi to grow, which leads to infection.  If you were prescribed an antibiotic medicine, take it as told by your health care provider. Do not stop taking the antibiotic even if you start to feel better.  Keep all follow-up visits as told by your health care provider. This is important. This information is not intended to replace advice given to you by your health care provider. Make sure you discuss any questions you have with your health care provider. Document Released: 10/16/2005 Document Revised: 03/18/2018 Document  Reviewed: 03/18/2018 Elsevier Interactive Patient Education  2019 Elsevier Inc.   Sumatriptan injection What is this medicine? SUMATRIPTAN (soo ma TRIP tan) is used to treat migraines with or without aura. An aura is a strange feeling or visual disturbance that warns you of an attack. It is not used to prevent migraines. This medicine may be used for other purposes; ask your health care provider or pharmacist if you have questions. COMMON BRAND NAME(S): Alsuma, Imitrex, Imitrex STAT dose, Sumavel DosePro System What should I tell my health care provider before I take this medicine? They need to know if you have any of these conditions: -cigarette smoker -circulation problems in fingers and toes -diabetes -heart disease -high blood pressure -high cholesterol -history of irregular heartbeat -history of stroke -kidney disease -liver disease -stomach or intestine problems -an unusual or allergic reaction to sumatriptan, latex, other medicines, foods, dyes, or preservatives -pregnant or trying to get pregnant -breast-feeding How should I use this medicine? This medicine is for injection under the skin. You will be taught how to prepare and give this medicine. Use exactly as directed.  Do not take your medicine more often than directed. Talk to your pediatrician regarding the use of this medicine in children. Special care may be needed. Overdosage: If you think you have taken too much of this medicine contact a poison control center or emergency room at once. NOTE: This medicine is only for you. Do not share this medicine with others. What if I miss a dose? This does not apply. This medicine is not for regular use. What may interact with this medicine? Do not take this medicine with any of the following medicines: -certain medicines for migraine headache like almotriptan, eletriptan, frovatriptan, naratriptan, rizatriptan, sumatriptan, zolmitriptan -ergot alkaloids like  dihydroergotamine, ergonovine, ergotamine, methylergonovine -MAOIs like Carbex, Eldepryl, Marplan, Nardil, and Parnate This medicine may also interact with the following medications: -certain medicines for depression, anxiety, or psychotic disorders This list may not describe all possible interactions. Give your health care provider a list of all the medicines, herbs, non-prescription drugs, or dietary supplements you use. Also tell them if you smoke, drink alcohol, or use illegal drugs. Some items may interact with your medicine. What should I watch for while using this medicine? Visit your healthcare professional for regular checks on your progress. Tell your healthcare professional if your symptoms do not start to get better or if they get worse. You may get drowsy or dizzy. Do not drive, use machinery, or do anything that needs mental alertness until you know how this medicine affects you. Do not stand up or sit up quickly, especially if you are an older patient. This reduces the risk of dizzy or fainting spells. Alcohol may interfere with the effect of this medicine. Tell your healthcare professional right away if you have any change in your eyesight. If you take migraine medicines for 10 or more days a month, your migraines may get worse. Keep a diary of headache days and medicine use. Contact your healthcare professional if your migraine attacks occur more frequently. What side effects may I notice from receiving this medicine? Side effects that you should report to your doctor or health care professional as soon as possible: -allergic reactions like skin rash, itching or hives, swelling of the face, lips, or tongue -changes in vision -chest pain or chest tightness -signs and symptoms of a dangerous change in heartbeat or heart rhythm like chest pain; dizziness; fast, irregular heartbeat; palpitations; feeling faint or lightheaded; falls; breathing problems -signs and symptoms of a stroke like  changes in vision; confusion; trouble speaking or understanding; severe headaches; sudden numbness or weakness of the face, arm or leg; trouble walking; dizziness; loss of balance or coordination -signs and symptoms of serotonin syndrome like irritable; confusion; diarrhea; fast or irregular heartbeat; muscle twitching; stiff muscles; trouble walking; sweating; high fever; seizures; chills; vomiting Side effects that usually do not require medical attention (report to your doctor or health care professional if they continue or are bothersome): -diarrhea -dizziness -drowsiness -dry mouth -headache -nausea, vomiting -pain, redness, or irritation at site where injected -pain, tingling, numbness in the hands or feet -stomach pain This list may not describe all possible side effects. Call your doctor for medical advice about side effects. You may report side effects to FDA at 1-800-FDA-1088. Where should I keep my medicine? Keep out of the reach of children. You will be instructed on how to store this medicine. Throw away any unused medicine after the expiration date on the label. NOTE: This sheet is a summary. It may not cover all possible information. If you  have questions about this medicine, talk to your doctor, pharmacist, or health care provider.  2019 Elsevier/Gold Standard (2018-03-01 12:58:21)

## 2018-11-19 NOTE — Progress Notes (Signed)
[name]    Provider:  Dr Lucia Gaskins Referring Provider: No ref. provider found Primary Care Physician:  [facility]  CC:  Migraines [name] has 23 headache days a month.  15 migraine days a monthnd dizziness  Interval history; Patient doing exceptionally well on Aimovig. She has had only a few migraines since starting and one was when she was last for the Aimovig. Her baseline is 23 headache days a month.  15 migraine days a month. MRi brain normal.  She has drainage and chronic sinusitis. Reviewed images with patient, she has drainage and sinus symptoms, MRI showed: "mucoperiosteal thickening and some mucous retention cysts within the left maxillary sinus and large mucous retention cyst within the right maxillary sinus."  HPI:  Tabitha Smith is a 28 y.o. female here as requested by Dr. No ref. provider found for migraines. She has had them since a child. She missed lots of days of school in HS. Migraines/headaches worsening since having son 2 years ago. Migraines can be unilateral , eye pain, +photo/phonophobia, +nausea and vomiting, has to go into a dark room which helps, can't have any noise. Marland Kitchen Anxiety and stress makes it worse. 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 She also has intermittent dizziness with and without the headache. She feels like she just got off an elevator. When she lays down she gets a rush of dizziness and headache, positionally worse. She wakes in the morning with headaches, they have woken her up in the middle of the night. She is having numbness on the left side of her face. Not having anymore children. Not sexually active and uses birth control. No aura. No medication overuse.    Medications tried: Sumatriptan, Topiramate, Propranolol, Nortriptyline, Fioricet, she has tried "everything", Zomig nasal, maxalt,  Reviewed notes, labs and imaging from outside physicians, which showed:  Reviewed notes from referring  physician Dr. Doran Heater.  Patient was seen in ear nose and throat for the evaluation of dizziness.  The problem intermittent.  Worse with moving around and last a few seconds.  Started 3 weeks prior to visit which was July 22, 2018.  Feels like she just got off an elevator, no hearing loss, no vertigo or room spinning, no voice changes she does report tingling in the hands and headaches with pain in the sides of the heads, with headaches there is photophobia and phonophobia, she is has history of migraines in the past.  Antinausea medications did not help.  Reviewed exam and work-up which was unremarkable.  Patient was scheduled for Dix-Hallpike with lenses and audiometric testing, and referred to neurology suspecting likely migraine disorder.   Review of Systems: Patient complains of symptoms per HPI as well as the following symptoms: dizziness, headaches, insomnia, fatigue. Pertinent negatives and positives per HPI. All others negative.   Social History   Socioeconomic History  . Marital status: Single    Spouse name: Not on file  . Number of children: 1  . Years of education: Not on file  . Highest education level: High school graduate  Occupational History  . Not on file  Social Needs  . Financial resource strain: Not on file  . Food insecurity:    Worry: Not on file    Inability: Not on file  . Transportation needs:    Medical: Not on file    Non-medical: Not on file  Tobacco Use  . Smoking status: Current Every Day Smoker    Packs/day:  0.50    Types: Cigarettes  . Smokeless tobacco: Never Used  Substance and Sexual Activity  . Alcohol use: Yes    Comment: occasionally  . Drug use: Never  . Sexual activity: Not on file  Lifestyle  . Physical activity:    Days per week: Not on file    Minutes per session: Not on file  . Stress: Not on file  Relationships  . Social connections:    Talks on phone: Not on file    Gets together: Not on file    Attends religious  service: Not on file    Active member of club or organization: Not on file    Attends meetings of clubs or organizations: Not on file    Relationship status: Not on file  . Intimate partner violence:    Fear of current or ex partner: Not on file    Emotionally abused: Not on file    Physically abused: Not on file    Forced sexual activity: Not on file  Other Topics Concern  . Not on file  Social History Narrative   Lives at home with family   Left handed   Caffeine: coffee 4-5 times weekly    Family History  Problem Relation Age of Onset  . Diabetes Mother   . Seizures Mother   . Migraines Neg Hx   . Headache Neg Hx     Past Medical History:  Diagnosis Date  . Migraine   . Sinusitis     Past Surgical History:  Procedure Laterality Date  . NO PAST SURGERIES    . stitches to abdomen      for a cut     Current Outpatient Medications  Medication Sig Dispense Refill  . Erenumab-aooe (AIMOVIG) 140 MG/ML SOAJ Inject 140 mg into the skin every 30 (thirty) days. 1 pen 11  . zolpidem (AMBIEN) 10 MG tablet Take 10 mg by mouth at bedtime.    Marland Kitchen. ipratropium (ATROVENT) 0.06 % nasal spray Place 2 sprays into both nostrils 4 (four) times daily. (Patient not taking: Reported on 11/19/2018) 15 mL 0  . SUMAtriptan (IMITREX) 6 MG/0.5ML SOLN injection Take one dose at headache onset, can take additional dose 2hrs later if needed. No more then 2 injections in 24hrs 6 vial 6   No current facility-administered medications for this visit.     Allergies as of 11/19/2018 - Review Complete 11/19/2018  Allergen Reaction Noted  . Amoxicillin Anaphylaxis 09/10/2017  . Penicillins Anaphylaxis 06/19/2016    Vitals: BP 104/61 (BP Location: Right Arm, Patient Position: Sitting)   Pulse 65   Ht 5\' 6"  (1.676 m)   Wt 164 lb (74.4 kg)   LMP 10/30/2018   BMI 26.47 kg/m  Last Weight:  Wt Readings from Last 1 Encounters:  11/19/18 164 lb (74.4 kg)   Last Height:   Ht Readings from Last 1  Encounters:  11/19/18 5\' 6"  (1.676 m)   Physical exam: Exam: Gen: NAD, conversant, well nourised, well groomed                     CV: RRR, no MRG. No Carotid Bruits. No peripheral edema, warm, nontender Eyes: Conjunctivae clear without exudates or hemorrhage  Neuro: Detailed Neurologic Exam  Speech:    Speech is normal; fluent and spontaneous with normal comprehension.  Cognition:    The patient is oriented to person, place, and time;     recent and remote memory intact;  language fluent;     normal attention, concentration,     fund of knowledge Cranial Nerves:    The pupils are equal, round, and reactive to light. The fundi are normal and spontaneous venous pulsations are present. Visual fields are full to finger confrontation. Extraocular movements are intact. Trigeminal sensation is intact and the muscles of mastication are normal. The face is symmetric. The palate elevates in the midline. Hearing intact. Voice is normal. Shoulder shrug is normal. The tongue has normal motion without fasciculations.   Coordination:    Normal finger to nose and heel to shin. Normal rapid alternating movements.   Gait:    Heel-toe and tandem gait are normal.   Motor Observation:    No asymmetry, no atrophy, and no involuntary movements noted. Tone:    Normal muscle tone.    Posture:    Posture is normal. normal erect    Strength:    Strength is V/V in the upper and lower limbs.      Sensation: intact to LT     Reflex Exam:  DTR's:    Deep tendon reflexes in the upper and lower extremities are symmetrical bilaterally.   Toes:    The toes are downgoing bilaterally.   Clonus:    Clonus is absent.       Assessment/Plan:  28 year old with chronic intractable headaches and acute on chronic maxillary sinusitis.   - Patient doing exceptionally well on Aimovig. She has had only a few migraines since starting and one was when she was last for the Aimovig. Her baseline is 23  headache days a month.  15 migraine days a month  -  Reviewed normal MRI  images with patient, she has drainage and sinus symptoms, MRI showed: "mucoperiosteal thickening and some mucous retention cysts within the left maxillary sinus and large mucous retention cyst within the right maxillary sinus."  - See above, will refer to Dr. Haroldine Laws for chronic sinusitis that shows on MRi with large cysts in the maxillary sinuses and thickening  - She couldn't get the zomig nasal spray. She uses BC powder but rarely bc freq is so reduced.   Orders Placed This Encounter  Procedures  . Ambulatory referral to ENT   Meds ordered this encounter  Medications  . SUMAtriptan (IMITREX) 6 MG/0.5ML SOLN injection    Sig: Take one dose at headache onset, can take additional dose 2hrs later if needed. No more then 2 injections in 24hrs    Dispense:  6 vial    Refill:  6    A total of 25 minutes was spent face-to-face with this patient. Over half this time was spent on counseling patient on the  1. Chronic migraine without aura, with intractable migraine, so stated, with status migrainosus   2. Chronic maxillary sinusitis    diagnosis and different diagnostic and therapeutic options, counseling and coordination of care, risks ans benefits of management, compliance, or risk factor reduction and education.    Naomie Dean, MD  [facility] [address] [place], [place] [address]  Phone [phone] Fax [phone]

## 2018-12-04 ENCOUNTER — Telehealth: Payer: Self-pay | Admitting: *Deleted

## 2018-12-04 NOTE — Telephone Encounter (Signed)
I spoke with the patient and discussed the sumatriptan 6 mg injection comes in a multidose 5 mL vial. I discussed that each dose will be 0.5 mL and the vial will contain 10 doses per month. I advised pt that the pharmacy will show her to draw up the medication and I encouraged pt to ask questions and to call us back if needed. Pt aware of injection sites that she can use for this subcutaneous injection and I advised on proper skin prep with alcohol swab and to place used syringe in sharps box and do not attempt to recap needle. Pt also aware to use a new needle & syringe for every injection. She verbalized understanding, appreciation and had no further questions.

## 2018-12-04 NOTE — Telephone Encounter (Signed)
We received a PA for sumatriptan 6 mg auto-injector. However Dr. Lucia Gaskins ordered the vials and this is preferred by pt's insurance. I spoke with [facility] pharmacy and clarified the order. They only have 5 mL multidose vials available and there is 6 mg/0.5 mL. Dr. Lucia Gaskins aware and authorized the change to 1 vial (10 doses) per month. Instructions as follows: Inject 0.5 mL into the skin at onset of headache. May repeat 0.5 mL in two hours. Max of 1 mL (2 doses) in 24 hours. Dispense 5 mL per month (1 vial) along with ten 25 gauge 1 inch needles & ten 1 mL syringes. Per Selena Batten, the pt's co-pay will be $3 and they will have it there between 3-4 pm tomorrow.

## 2018-12-11 ENCOUNTER — Telehealth: Payer: Self-pay | Admitting: *Deleted

## 2018-12-11 NOTE — Telephone Encounter (Signed)
PA for Sumatriptan 6mg /0.11ml inj. completed by phone with  [facility] (phone# [phone]). Dx: Chronic migraine (G43.709).  Tried and failed meds: Sumatriptan tablets, Fioricet, Tylenol, Ibuprofen, Maxalt, Zomig nasal spray, Tramadol, Naproxen, Medrol. PA# [id number]. Interaction ID: [id number]/[id number]

## 2018-12-30 ENCOUNTER — Telehealth: Payer: Self-pay | Admitting: Neurology

## 2018-12-30 ENCOUNTER — Encounter: Payer: Self-pay | Admitting: *Deleted

## 2018-12-30 NOTE — Telephone Encounter (Addendum)
Pt states she has not had her Erenumab-aooe (AIMOVIG) 140 MG/ML SOAJ in Feb and coming up on missing it for the month of March because of her pharmacy not having it.  Pt is asking for a call because she is starting to get headaches again. Pt states re:the SUMAtriptan (IMITREX) 6 MG/0.5ML SOLN injection it is difficult and uncomfortable for her to do herself.  Pt would like to know if she can come in for that to be done by and RN and if not can she be put on something else.  Please call

## 2018-12-30 NOTE — Telephone Encounter (Signed)
I called [facility] on [address] & spoke with Dorian. He was unsure why she had not been able to get it. They are out of right now but it will be in tomorrow around 3:00. He already put her in for a refill and stated it went through insurance fine. Will check with Dr. Lucia Gaskins to see what alternative medications pt can try.

## 2018-12-31 NOTE — Telephone Encounter (Signed)
I'll try maxalt. She was doing really well on the aimovig.

## 2019-01-01 ENCOUNTER — Encounter: Payer: Self-pay | Admitting: *Deleted

## 2019-01-01 NOTE — Telephone Encounter (Signed)
See mychart.  

## 2019-05-20 ENCOUNTER — Telehealth: Payer: Self-pay | Admitting: Neurology

## 2019-05-20 NOTE — Telephone Encounter (Signed)
Pt has called to inform that she has been having current headache for a couple of days and the rescue meds are not working.  Pt states she feels like her head is going to pop.  Please call

## 2019-05-20 NOTE — Telephone Encounter (Signed)
I called the pt back and LVM asking for call back. Advised on-call MD available after hours if needed. Advised ED for any emergency needs. Left office number in message with office hours.

## 2019-05-21 ENCOUNTER — Other Ambulatory Visit: Payer: Self-pay

## 2019-05-21 DIAGNOSIS — R51 Headache: Secondary | ICD-10-CM | POA: Diagnosis present

## 2019-05-21 DIAGNOSIS — Z79899 Other long term (current) drug therapy: Secondary | ICD-10-CM | POA: Diagnosis not present

## 2019-05-21 DIAGNOSIS — F17210 Nicotine dependence, cigarettes, uncomplicated: Secondary | ICD-10-CM | POA: Insufficient documentation

## 2019-05-21 DIAGNOSIS — G43009 Migraine without aura, not intractable, without status migrainosus: Secondary | ICD-10-CM | POA: Diagnosis not present

## 2019-05-22 ENCOUNTER — Other Ambulatory Visit: Payer: Self-pay

## 2019-05-22 ENCOUNTER — Encounter (HOSPITAL_COMMUNITY): Payer: Self-pay

## 2019-05-22 ENCOUNTER — Emergency Department (HOSPITAL_COMMUNITY)
Admission: EM | Admit: 2019-05-22 | Discharge: 2019-05-22 | Disposition: A | Discharge status: Home / Self Care | Payer: Medicaid Other | Attending: Emergency Medicine | Admitting: Emergency Medicine

## 2019-05-22 ENCOUNTER — Emergency Department (HOSPITAL_COMMUNITY): Payer: Medicaid Other

## 2019-05-22 DIAGNOSIS — G43009 Migraine without aura, not intractable, without status migrainosus: Secondary | ICD-10-CM

## 2019-05-22 MED ORDER — PROCHLORPERAZINE EDISYLATE 10 MG/2ML IJ SOLN
10.0000 mg | Freq: Once | INTRAMUSCULAR | Status: AC
  Administered 2019-05-22: 06:00:00 10 mg via INTRAVENOUS
  Filled 2019-05-22: qty 2

## 2019-05-22 MED ORDER — DEXAMETHASONE SODIUM PHOSPHATE 10 MG/ML IJ SOLN
10.0000 mg | Freq: Once | INTRAMUSCULAR | Status: AC
  Administered 2019-05-22: 04:00:00 10 mg via INTRAVENOUS
  Filled 2019-05-22: qty 1

## 2019-05-22 MED ORDER — METOCLOPRAMIDE HCL 5 MG/ML IJ SOLN
10.0000 mg | Freq: Once | INTRAMUSCULAR | Status: AC
  Administered 2019-05-22: 04:00:00 10 mg via INTRAVENOUS
  Filled 2019-05-22: qty 2

## 2019-05-22 MED ORDER — SODIUM CHLORIDE 0.9 % IV BOLUS
1000.0000 mL | Freq: Once | INTRAVENOUS | Status: AC
  Administered 2019-05-22: 04:00:00 1000 mL via INTRAVENOUS

## 2019-05-22 MED ORDER — KETOROLAC TROMETHAMINE 30 MG/ML IJ SOLN
30.0000 mg | Freq: Once | INTRAMUSCULAR | Status: AC
  Administered 2019-05-22: 06:00:00 30 mg via INTRAVENOUS
  Filled 2019-05-22: qty 1

## 2019-05-22 MED ORDER — DIPHENHYDRAMINE HCL 50 MG/ML IJ SOLN
25.0000 mg | Freq: Once | INTRAMUSCULAR | Status: AC
  Administered 2019-05-22: 04:00:00 25 mg via INTRAVENOUS
  Filled 2019-05-22: qty 1

## 2019-05-22 NOTE — ED Notes (Signed)
Pt ambulated to the restroom with out difficulty.

## 2019-05-22 NOTE — ED Provider Notes (Signed)
[facility]-EMERGENCY DEPT Provider Note   CSN: [id number] Arrival date & time: 05/21/19  2314    History   Chief Complaint Chief Complaint  Patient presents with  . Migraine    HPI Tabitha Smith is a 28 y.o. female.   The history is provided by the patient.  Migraine  She has a history of migraine headaches and comes in with a bilateral headache for the last 3days.  She states it feels like her head is going to explode.  Pain is rated at 7/10.  There is associated photophobia and phonophobia.  She has had nausea and vomiting.  She denies blurred vision but does see spots in front of her eyes.  She denies fever and denies weakness, numbness, tingling.  She tried taking sumatriptan, naproxen, tramadol without any relief.  She has not been able to eat anything for 3 days because of the vomiting.  Of note, she was exposed to the COVID-19 3 weeks ago, and was tested 1 week ago and was negative.  Past Medical History:  Diagnosis Date  . Migraine   . Sinusitis     Patient Active Problem List   Diagnosis Date Noted  . Chronic migraine without aura, with intractable migraine, so stated, with status migrainosus 08/06/2018    Past Surgical History:  Procedure Laterality Date  . NO PAST SURGERIES    . stitches to abdomen      for a cut      OB History   No obstetric history on file.      Home Medications    Prior to Admission medications   Medication Sig Start Date End Date Taking? Authorizing Provider  Erenumab-aooe (AIMOVIG) 140 MG/ML SOAJ Inject 140 mg into the skin every 30 (thirty) days. 08/06/18   Anson Fret, MD  ipratropium (ATROVENT) 0.06 % nasal spray Place 2 sprays into both nostrils 4 (four) times daily. Patient not taking: Reported on 11/19/2018 08/09/18   Belinda Fisher, PA-C  SUMAtriptan (IMITREX) 6 MG/0.5ML SOLN injection Take one dose at headache onset, can take additional dose 2hrs later if needed. No more then 2 injections in 24hrs  11/19/18   Anson Fret, MD  zolpidem (AMBIEN) 10 MG tablet Take 10 mg by mouth at bedtime.    [provider]    Family History Family History  Problem Relation Age of Onset  . Diabetes Mother   . Seizures Mother   . Migraines Neg Hx   . Headache Neg Hx     Social History Social History   Tobacco Use  . Smoking status: Current Every Day Smoker    Packs/day: 0.50    Types: Cigarettes  . Smokeless tobacco: Never Used  Substance Use Topics  . Alcohol use: Yes    Comment: occasionally  . Drug use: Never     Allergies   Amoxicillin and Penicillins   Review of Systems Review of Systems  All other systems reviewed and are negative.    Physical Exam Updated Vital Signs BP 130/83 (BP Location: Left Arm)   Pulse 66   Temp 98.7 F (37.1 C) (Oral)   Resp 15   Ht 5\' 6"  (1.676 m)   Wt 74.8 kg   SpO2 99%   BMI 26.63 kg/m   Physical Exam Vitals signs and nursing note reviewed.    28 year old female, resting comfortably and in no acute distress. Vital signs are normal. Oxygen saturation is 99%, which is normal. Head is  normocephalic and atraumatic. PERRLA, EOMI. Oropharynx is clear.  There is tenderness palpation over the temporalis muscle bilaterally and over the insertion of the paracervical muscles bilaterally-both of those are worse on the left. Neck is nontender and supple without adenopathy or JVD. Back is nontender and there is no CVA tenderness. Lungs are clear without rales, wheezes, or rhonchi. Chest is nontender. Heart has regular rate and rhythm without murmur. Abdomen is soft, flat, nontender without masses or hepatosplenomegaly and peristalsis is normoactive. Extremities have no cyanosis or edema, full range of motion is present. Skin is warm and dry without rash. Neurologic: Mental status is normal, cranial nerves are intact, there are no motor or sensory deficits.  ED Treatments / Results    Ct Head Wo Contrast  Result Date:  05/22/2019 CLINICAL DATA:  Acute headache, worst of life EXAM: CT HEAD WITHOUT CONTRAST TECHNIQUE: Contiguous axial images were obtained from the base of the skull through the vertex without intravenous contrast. COMPARISON:  08/24/2018 brain MRI report (images not currently available FINDINGS: Brain: No evidence of acute infarction, hemorrhage, hydrocephalus, extra-axial collection or mass lesion/mass effect. Vascular: No hyperdense vessel or unexpected calcification. Skull: Normal. Negative for fracture or focal lesion. Sinuses/Orbits: Negative IMPRESSION: Normal head CT. Electronically Signed   By: Monte Fantasia M.D.   On: 05/22/2019 07:16   Procedures Procedures   Medications Ordered in ED Medications  sodium chloride 0.9 % bolus 1,000 mL (0 mLs Intravenous Stopped 05/22/19 0523)  metoCLOPramide (REGLAN) injection 10 mg (10 mg Intravenous Given 05/22/19 0353)  diphenhydrAMINE (BENADRYL) injection 25 mg (25 mg Intravenous Given 05/22/19 0354)  dexamethasone (DECADRON) injection 10 mg (10 mg Intravenous Given 05/22/19 0354)  prochlorperazine (COMPAZINE) injection 10 mg (10 mg Intravenous Given 05/22/19 0546)  ketorolac (TORADOL) 30 MG/ML injection 30 mg (30 mg Intravenous Given 05/22/19 0546)     Initial Impression / Assessment and Plan / ED Course  I have reviewed the triage vital signs and the nursing notes.  Headache which may be migraine, may be muscle contraction headache.  No red flags to suggest more serious pathology such as meningitis, subarachnoid hemorrhage, tumor.  Old records are reviewed confirming outpatient management of migraines and 1 prior ED visit for migraine.  She will be given a migraine cocktail of normal saline, metoclopramide, diphenhydramine, dexamethasone and reassessed.  5:10 AM Headache is not any better with above-noted treatment.  She will be given prochlorperazine and ketorolac, will send for CT of head.  7:20 AM CT of head is normal.  She got excellent relief  of pain with above-noted treatment and is felt to be safe for discharge at this point.  Final Clinical Impressions(s) / ED Diagnoses   Final diagnoses:  Migraine without aura and without status migrainosus, not intractable    ED Discharge Orders    None       Delora Fuel, MD 53/29/92 [phone]

## 2019-05-22 NOTE — ED Triage Notes (Signed)
Pt has been exposed to covid approx the 7th and tested negative 1 weeks ago.

## 2019-05-22 NOTE — ED Triage Notes (Signed)
Pt states that she has had a migraine X 3 days. Has h of migraines but home meds normally take care of them. States "this is a strange feeling one".  Pt takes Amovig x 1 monthly and sumatriptan

## 2019-06-21 ENCOUNTER — Other Ambulatory Visit: Payer: Self-pay

## 2019-06-21 DIAGNOSIS — Z20822 Contact with and (suspected) exposure to covid-19: Secondary | ICD-10-CM

## 2019-06-21 NOTE — Addendum Note (Signed)
Addended by: Luisa Dago A on: 06/21/2019 10:37 AM   Modules accepted: Orders

## 2019-06-22 LAB — NOVEL CORONAVIRUS, NAA: SARS-CoV-2, NAA: NOT DETECTED

## 2019-09-08 ENCOUNTER — Encounter (HOSPITAL_COMMUNITY): Payer: Self-pay | Admitting: Emergency Medicine

## 2019-09-08 ENCOUNTER — Ambulatory Visit (HOSPITAL_COMMUNITY)
Admission: EM | Admit: 2019-09-08 | Discharge: 2019-09-08 | Disposition: A | Discharge status: Home / Self Care | Payer: Medicaid Other | Attending: Family Medicine | Admitting: Family Medicine

## 2019-09-08 ENCOUNTER — Other Ambulatory Visit: Payer: Self-pay

## 2019-09-08 DIAGNOSIS — R112 Nausea with vomiting, unspecified: Secondary | ICD-10-CM | POA: Diagnosis not present

## 2019-09-08 DIAGNOSIS — R42 Dizziness and giddiness: Secondary | ICD-10-CM | POA: Diagnosis not present

## 2019-09-08 DIAGNOSIS — R197 Diarrhea, unspecified: Secondary | ICD-10-CM | POA: Diagnosis not present

## 2019-09-08 MED ORDER — DICYCLOMINE HCL 20 MG PO TABS: 20.0000 mg | ORAL_TABLET | Freq: Three times a day (TID) | ORAL | 0 refills | Status: DC

## 2019-09-08 MED ORDER — ONDANSETRON 4 MG PO TBDP: 4.0000 mg | ORAL_TABLET | Freq: Three times a day (TID) | ORAL | 0 refills | Status: DC | PRN

## 2019-09-08 MED ORDER — MECLIZINE HCL 12.5 MG PO TABS: 12.5000 mg | ORAL_TABLET | Freq: Three times a day (TID) | ORAL | 0 refills | Status: DC | PRN

## 2019-09-08 NOTE — ED Triage Notes (Deleted)
Cough, fever -unknown degree, felt warm.  Complains of sore throat.  Reports bilateral ear pain

## 2019-09-08 NOTE — Discharge Instructions (Addendum)
Please return stool kit to send off stool for further testing Please try using Bentyl before meals and bedtime to help with discomfort in abdomen Zofran as needed for nausea/vomiting Push fluids and try drinking Gatorade/Pedialyte if not eating solids Continue using Imodium to help slow stools  Please use meclizine as needed for dizziness/nausea Dizziness suggestive of vertigo Please YouTube Epley maneuver and log roll/barbecue maneuver for vertigo and try doing these maneuvers 2-3 times a day to help decrease frequency/intensity of symptoms  Please continue to monitor, follow-up if symptoms not resolving or worsening, if dizziness persisting please follow-up with ENT; if nausea vomiting and diarrhea continue please follow-up with gastroenterology.

## 2019-09-09 NOTE — ED Provider Notes (Signed)
[place]    CSN: [id number] Arrival date & time: 09/08/19  1302      History   Chief Complaint Chief Complaint  Patient presents with  . Abdominal Pain  . Dizziness    HPI Tabitha Smith is a 28 y.o. female history of migraines presenting today for evaluation of dizziness and nausea vomiting and diarrhea.  Patient states that over the past month she has had a sensation of dizziness.  Describes the dizziness as room spinning.  Notices it mainly with changing positions looking upward.  She denies associated nausea or vomiting with the dizziness typically, but has developed some nausea vomiting and diarrhea over the past week.  She has had associated abdominal pain.  She rates this at approximately a 7 out of 10.  She states that the pain will wax and wane.  Stools have continued to be frequent and loose.  States that the food will go straight through her.  She denies any recent travel.  Denies recent antibiotic use.  Denies blood in the stool.  Denies rectal pain.  She notes that she had a colonoscopy approximately 5 years ago that was normal.  She also does note that her mother recently passed away within the past month of stage IV colon cancer.  This was sudden and unexpected as she had had a recent colonoscopies that were normal.  She has had poor appetite.  Feels her dizziness has worsened since she has developed this nausea vomiting and diarrhea.  She denies any chest pain or shortness of breath.  Does have headaches, but this is normal for her.  Denies vision changes.  HPI  Past Medical History:  Diagnosis Date  . Migraine   . Sinusitis     Patient Active Problem List   Diagnosis Date Noted  . Chronic migraine without aura, with intractable migraine, so stated, with status migrainosus 08/06/2018    Past Surgical History:  Procedure Laterality Date  . NO PAST SURGERIES    . stitches to abdomen      for a cut     OB History   No obstetric history on file.       Home Medications    Prior to Admission medications   Medication Sig Start Date End Date Taking? Authorizing Provider  dicyclomine (BENTYL) 20 MG tablet Take 1 tablet (20 mg total) by mouth 4 (four) times daily -  before meals and at bedtime. 09/08/19   Dezi Brauner C, PA-C  Erenumab-aooe (AIMOVIG) 140 MG/ML SOAJ Inject 140 mg into the skin every 30 (thirty) days. 08/06/18   Melvenia Beam, MD  meclizine (ANTIVERT) 12.5 MG tablet Take 1 tablet (12.5 mg total) by mouth 3 (three) times daily as needed for dizziness or nausea. 09/08/19   Kamarie Palma C, PA-C  mirabegron ER (MYRBETRIQ) 50 MG TB24 tablet Take 50 mg by mouth daily.    [provider]  ondansetron (ZOFRAN ODT) 4 MG disintegrating tablet Take 1 tablet (4 mg total) by mouth every 8 (eight) hours as needed for nausea or vomiting. 09/08/19   Cloa Bushong C, PA-C  SUMAtriptan (IMITREX) 6 MG/0.5ML SOLN injection Take one dose at headache onset, can take additional dose 2hrs later if needed. No more then 2 injections in 24hrs 11/19/18   Melvenia Beam, MD  zolpidem (AMBIEN) 10 MG tablet Take 10 mg by mouth at bedtime.    [provider]  ipratropium (ATROVENT) 0.06 % nasal spray Place 2 sprays into both nostrils  4 (four) times daily. Patient not taking: Reported on 11/19/2018 08/09/18 05/22/19  Arturo Morton    Family History Family History  Problem Relation Age of Onset  . Diabetes Mother   . Seizures Mother   . Cancer Mother   . Hypertension Mother   . Migraines Neg Hx   . Headache Neg Hx     Social History Social History   Tobacco Use  . Smoking status: Current Every Day Smoker    Packs/day: 0.50    Types: Cigarettes  . Smokeless tobacco: Never Used  Substance Use Topics  . Alcohol use: Yes    Comment: occasionally  . Drug use: Never     Allergies   Amoxicillin and Penicillins   Review of Systems Review of Systems  Constitutional: Negative for fatigue and fever.  HENT: Negative for  congestion, sinus pressure and sore throat.   Eyes: Negative for photophobia, pain and visual disturbance.  Respiratory: Negative for cough and shortness of breath.   Cardiovascular: Negative for chest pain.  Gastrointestinal: Positive for abdominal pain and diarrhea. Negative for nausea and vomiting.  Genitourinary: Negative for decreased urine volume, dysuria, flank pain, genital sores, hematuria, menstrual problem, vaginal bleeding, vaginal discharge and vaginal pain.  Musculoskeletal: Negative for back pain, myalgias, neck pain and neck stiffness.  Skin: Negative for rash.  Neurological: Positive for dizziness and headaches. Negative for syncope, facial asymmetry, speech difficulty, weakness, light-headedness and numbness.     Physical Exam Triage Vital Signs ED Triage Vitals  Enc Vitals Group     BP 09/08/19 1443 116/77     Pulse Rate 09/08/19 1443 68     Resp 09/08/19 1443 18     Temp 09/08/19 1443 98.6 F (37 C)     Temp Source 09/08/19 1443 Oral     SpO2 09/08/19 1443 100 %     Weight --      Height --      Head Circumference --      Peak Flow --      Pain Score 09/08/19 1441 0     Pain Loc --      Pain Edu? --      Excl. in [place]? --    Orthostatic VS for the past 24 hrs:  BP- Lying Pulse- Lying BP- Sitting Pulse- Sitting BP- Standing at 0 minutes Pulse- Standing at 0 minutes  09/08/19 1520 109/66 56 116/71 61 117/80 62    Updated Vital Signs BP 116/77 (BP Location: Left Arm)   Pulse 68   Temp 98.6 F (37 C) (Oral)   Resp 18   SpO2 100%   Visual Acuity Right Eye Distance:   Left Eye Distance:   Bilateral Distance:    Right Eye Near:   Left Eye Near:    Bilateral Near:     Physical Exam Vitals signs and nursing note reviewed.  Constitutional:      General: She is not in acute distress.    Appearance: She is well-developed.  HENT:     Head: Normocephalic and atraumatic.     Ears:     Comments: Bilateral ears without tenderness to palpation of external  auricle, tragus and mastoid, EAC's without erythema or swelling, TM's with good bony landmarks and cone of light. Non erythematous.     Mouth/Throat:     Comments: Oral mucosa pink and moist, no tonsillar enlargement or exudate. Posterior pharynx patent and nonerythematous, no uvula deviation or swelling. Normal phonation. Palate elevates symmetrically  Eyes:     Extraocular Movements: Extraocular movements intact.     Conjunctiva/sclera: Conjunctivae normal.     Pupils: Pupils are equal, round, and reactive to light.  Neck:     Musculoskeletal: Neck supple.  Cardiovascular:     Rate and Rhythm: Normal rate and regular rhythm.     Heart sounds: No murmur.  Pulmonary:     Effort: Pulmonary effort is normal. No respiratory distress.     Breath sounds: Normal breath sounds.     Comments: Breathing comfortably at rest, CTABL, no wheezing, rales or other adventitious sounds auscultated  Abdominal:     Palpations: Abdomen is soft.     Tenderness: There is no abdominal tenderness.     Comments: Soft, nondistended, tenderness to palpation of bilateral lower quadrants, no focal tenderness, negative rebound, negative Rovsing, negative McBurney's  Skin:    General: Skin is warm and dry.  Neurological:     Mental Status: She is alert.     Comments: Patient A&O x3, cranial nerves II-XII grossly intact, strength at shoulders, hips and knees 5/5, equal bilaterally, patellar reflex 2+ bilaterally.  Negative Romberg and Pronator Drift. Gait without abnormality.  Weakly positive Dix-Hallpike, patient developed dizziness with lying supine, faint fatigable nystagmus noted, symptoms were recreated with head position toward right and left  Psychiatric:     Comments: Affect flat      UC Treatments / Results  Labs (all labs ordered are listed, but only abnormal results are displayed) Labs Reviewed - No data to display  EKG   Radiology No results found.  Procedures Procedures (including  critical care time)  Medications Ordered in UC Medications - No data to display  Initial Impression / Assessment and Plan / UC Course  I have reviewed the triage vital signs and the nursing notes.  Pertinent labs & imaging results that were available during my care of the patient were reviewed by me and considered in my medical decision making (see chart for details).     Given persistent frequent stools/diarrhea > 1 week, will have patient return stool sample to send off for GI pathogen panel to rule out infectious etiology.  Recommended patient to continue to use Imodium, will provide Bentyl to use as needed as well as Zofran.  Push fluids.  Continue to monitor symptoms over this week.  Did go ahead and place GI referral for follow-up given history with mom, persistent symptoms and recommended to follow-up if symptoms not resolving over the next week with the medicines provided today.  Symptoms may be related to stress/depression from recent mom passing.  Advised if symptoms improving with medicines provided advised she does not need to follow-up with gastroenterology except for next recommended colonoscopy as previously told. negative peritoneal signs, do not suspect abdominal emergency.    Negative orthostatics, dizziness described as spinning and positional, reproducible in clinic with Dix-Hallpike, most likely BPPV.  Discussed trying Epley maneuver and log roll to help decrease frequency and intensity of symptoms, meclizine as needed for nausea and dizziness.  Follow-up with ENT if symptoms persisting.  No neuro deficit, do not suspect central cause of dizziness.    Continue to monitor all symptoms,Discussed strict return precautions. Patient verbalized understanding and is agreeable with plan.  Final Clinical Impressions(s) / UC Diagnoses   Final diagnoses:  Dizziness  Vertigo  Nausea vomiting and diarrhea     Discharge Instructions     Please return stool kit to send off stool  for further testing  Please try using Bentyl before meals and bedtime to help with discomfort in abdomen Zofran as needed for nausea/vomiting Push fluids and try drinking Gatorade/Pedialyte if not eating solids Continue using Imodium to help slow stools  Please use meclizine as needed for dizziness/nausea Dizziness suggestive of vertigo Please YouTube Epley maneuver and log roll/barbecue maneuver for vertigo and try doing these maneuvers 2-3 times a day to help decrease frequency/intensity of symptoms  Please continue to monitor, follow-up if symptoms not resolving or worsening, if dizziness persisting please follow-up with ENT; if nausea vomiting and diarrhea continue please follow-up with gastroenterology.   ED Prescriptions    Medication Sig Dispense Auth. Provider   meclizine (ANTIVERT) 12.5 MG tablet Take 1 tablet (12.5 mg total) by mouth 3 (three) times daily as needed for dizziness or nausea. 30 tablet Shenee Wignall C, PA-C   ondansetron (ZOFRAN ODT) 4 MG disintegrating tablet Take 1 tablet (4 mg total) by mouth every 8 (eight) hours as needed for nausea or vomiting. 20 tablet Bahar Shelden C, PA-C   dicyclomine (BENTYL) 20 MG tablet Take 1 tablet (20 mg total) by mouth 4 (four) times daily -  before meals and at bedtime. 20 tablet Shauntea Lok, [place] C, PA-C     PDMP not reviewed this encounter.   Janith Lima, [place] 09/10/19 1641

## 2019-09-10 ENCOUNTER — Telehealth: Payer: Self-pay | Admitting: Neurology

## 2019-09-10 MED ORDER — AIMOVIG 140 MG/ML ~~LOC~~ SOAJ: 140.0000 mg | SUBCUTANEOUS | 1 refills | Status: DC

## 2019-09-10 NOTE — Telephone Encounter (Signed)
Pt is needing a refill on her Erenumab-aooe (AIMOVIG) 140 MG/ML SOAJ sent to the [facility] on [address]

## 2019-09-10 NOTE — Telephone Encounter (Signed)
Pt will need an appt for continued refills. Last seen Jan 2020, need 1 yr f/u. 2 month refill sent to pharmacy with note that appt is needed.

## 2019-10-26 ENCOUNTER — Emergency Department (HOSPITAL_COMMUNITY)
Admission: EM | Admit: 2019-10-26 | Discharge: 2019-10-26 | Disposition: A | Discharge status: Home / Self Care | Payer: Medicaid Other | Attending: Emergency Medicine | Admitting: Emergency Medicine

## 2019-10-26 ENCOUNTER — Other Ambulatory Visit: Payer: Self-pay

## 2019-10-26 ENCOUNTER — Encounter (HOSPITAL_COMMUNITY): Payer: Self-pay | Admitting: Emergency Medicine

## 2019-10-26 DIAGNOSIS — Z79899 Other long term (current) drug therapy: Secondary | ICD-10-CM | POA: Insufficient documentation

## 2019-10-26 DIAGNOSIS — M545 Low back pain: Secondary | ICD-10-CM | POA: Diagnosis present

## 2019-10-26 DIAGNOSIS — F17210 Nicotine dependence, cigarettes, uncomplicated: Secondary | ICD-10-CM | POA: Insufficient documentation

## 2019-10-26 DIAGNOSIS — M5441 Lumbago with sciatica, right side: Secondary | ICD-10-CM | POA: Diagnosis not present

## 2019-10-26 LAB — POC URINE PREG, ED: Preg Test, Ur: NEGATIVE

## 2019-10-26 MED ORDER — PREDNISONE 10 MG (21) PO TBPK: ORAL_TABLET | ORAL | 0 refills | Status: DC

## 2019-10-26 MED ORDER — METHOCARBAMOL 500 MG PO TABS
500.0000 mg | ORAL_TABLET | Freq: Once | ORAL | Status: AC
  Administered 2019-10-26: 500 mg via ORAL
  Filled 2019-10-26: qty 1

## 2019-10-26 MED ORDER — LIDOCAINE 5 % EX PTCH: 1.0000 | MEDICATED_PATCH | CUTANEOUS | 0 refills | Status: DC

## 2019-10-26 MED ORDER — KETOROLAC TROMETHAMINE 60 MG/2ML IM SOLN
30.0000 mg | Freq: Once | INTRAMUSCULAR | Status: AC
  Administered 2019-10-26: 30 mg via INTRAMUSCULAR
  Filled 2019-10-26: qty 2

## 2019-10-26 MED ORDER — PREDNISONE 20 MG PO TABS
60.0000 mg | ORAL_TABLET | Freq: Once | ORAL | Status: AC
  Administered 2019-10-26: 08:00:00 60 mg via ORAL
  Filled 2019-10-26: qty 3

## 2019-10-26 MED ORDER — METHOCARBAMOL 500 MG PO TABS: 500.0000 mg | ORAL_TABLET | Freq: Two times a day (BID) | ORAL | 0 refills | Status: DC

## 2019-10-26 NOTE — ED Triage Notes (Signed)
Patient is complaining of lower back pain. Patient states the pain started being bad a week and half ago. Patient is not complaining of anything else.

## 2019-10-26 NOTE — Discharge Instructions (Addendum)
Take it easy, but do not lay around too much as this may make any stiffness worse.  Antiinflammatory medications: Take 600 mg of ibuprofen every 6 hours or 440 mg (over the counter dose) to 500 mg (prescription dose) of naproxen every 12 hours for the next 3 days. After this time, these medications may be used as needed for pain. Take these medications with food to avoid upset stomach. Choose only one of these medications, do not take them together. Acetaminophen (generic for Tylenol): Should you continue to have additional pain while taking the ibuprofen or naproxen, you may add in acetaminophen as needed. Your daily total maximum amount of acetaminophen from all sources should be limited to 4000mg /day for persons without liver problems, or 2000mg /day for those with liver problems. Methocarbamol: Methocarbamol (generic for Robaxin) is a muscle relaxer and can help relieve stiff muscles or muscle spasms.  Do not drive or perform other dangerous activities while taking this medication as it can cause drowsiness as well as changes in reaction time and judgement. Prednisone: Take the prednisone, as prescribed, until finished. If you are a diabetic, please know prednisone can raise your blood sugar temporarily. Lidocaine patches: These are available via either prescription or over-the-counter. The over-the-counter option may be more economical one and are likely just as effective. There are multiple over-the-counter brands, such as Salonpas. Exercises: Be sure to perform the attached exercises starting with three times a week and working up to performing them daily. This is an essential part of preventing long term problems.  Follow up: Follow up with a primary care provider for any future management of these complaints. Be sure to follow up within 7-10 days. Return: Return to the ED should symptoms worsen.  For prescription assistance, may try using prescription discount sites or apps, such as goodrx.com

## 2019-10-26 NOTE — ED Notes (Signed)
Pt ambulated to bathroom with no assistance. Ambulation slow, but successful.
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
## 2019-11-25 ENCOUNTER — Other Ambulatory Visit: Payer: Self-pay

## 2019-11-25 ENCOUNTER — Encounter: Payer: Self-pay | Admitting: Physical Therapy

## 2019-11-25 ENCOUNTER — Ambulatory Visit: Payer: Medicaid Other | Attending: Surgical | Admitting: Physical Therapy

## 2019-11-25 DIAGNOSIS — M6281 Muscle weakness (generalized): Secondary | ICD-10-CM | POA: Diagnosis present

## 2019-11-25 DIAGNOSIS — G8929 Other chronic pain: Secondary | ICD-10-CM | POA: Diagnosis present

## 2019-11-25 DIAGNOSIS — M5441 Lumbago with sciatica, right side: Secondary | ICD-10-CM | POA: Diagnosis not present

## 2019-11-25 DIAGNOSIS — R202 Paresthesia of skin: Secondary | ICD-10-CM
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
## 2019-12-03 ENCOUNTER — Ambulatory Visit: Payer: Medicaid Other | Admitting: Physical Therapy

## 2019-12-09 ENCOUNTER — Encounter: Payer: Medicaid Other | Admitting: Physical Therapy

## 2019-12-12 ENCOUNTER — Encounter: Payer: Self-pay | Admitting: Physical Therapy

## 2019-12-12 ENCOUNTER — Ambulatory Visit: Payer: Medicaid Other | Attending: Surgical | Admitting: Physical Therapy

## 2019-12-12 ENCOUNTER — Other Ambulatory Visit: Payer: Self-pay

## 2019-12-12 DIAGNOSIS — M5441 Lumbago with sciatica, right side: Secondary | ICD-10-CM | POA: Insufficient documentation

## 2019-12-12 DIAGNOSIS — M6281 Muscle weakness (generalized): Secondary | ICD-10-CM | POA: Insufficient documentation

## 2019-12-12 DIAGNOSIS — G8929 Other chronic pain: Secondary | ICD-10-CM | POA: Insufficient documentation

## 2019-12-12 DIAGNOSIS — R202 Paresthesia of skin: Secondary | ICD-10-CM | POA: Diagnosis present

## 2019-12-12 NOTE — Patient Instructions (Addendum)
TENS UNIT  This is helpful for muscle pain and spasm.   Search and Purchase a TENS 7000 2nd edition at www.tenspros.com or www.amazon.com  (It should be less than $30)     TENS unit instructions:   Do not shower or bathe with the unit on  Turn the unit off before removing electrodes or batteries  If the electrodes lose stickiness add a drop of water to the electrodes after they are disconnected from the unit and place on plastic sheet. If you continued to have difficulty, call the TENS unit company to purchase more electrodes.  Do not apply lotion on the skin area prior to use. Make sure the skin is clean and dry as this will help prolong the life of the electrodes.  After use, always check skin for unusual red areas, rash or other skin difficulties. If there are any skin problems, does not apply electrodes to the same area.  Never remove the electrodes from the unit by pulling the wires.  Do not use the TENS unit or electrodes other than as directed.  Do not change electrode placement without consulting your therapist or physician.  Keep 2 fingers with between each electrode.   Access Code: [email or web address]  URL: [email or web address]  Date: 12/12/2019  Prepared by: Lavinia Sharps   Exercises Prone Hip Extension - One Pillow - 10 reps - 1 sets - 1x daily - 7x weekly Clamshell - 10 reps - 1 sets - 1x daily - 7x weekly Supine Transversus Abdominis Bracing - Hands on Stomach - 10 reps - 1 sets - 1x daily - 7x weekly Hooklying Isometric Hip Flexion - 10 reps - 1 sets - 1x daily - 7x weekly

## 2019-12-12 NOTE — Therapy (Signed)
[facility] [address] [place], [place], [address] Phone: [phone]   Fax:  [phone]  Physical Therapy Treatment  Patient Details  Name: Tabitha Smith MRN: [id number] Date of Birth: Apr 02, 1991 Referring Provider (PT): Janine Ores PA   Encounter Date: 12/12/2019  PT End of Session - 12/12/19 1119    Visit Number  2    Number of Visits  4    Date for PT Re-Evaluation  01/20/20    Authorization Type  Medicaid 2/2-2/22 Eval + 3 visits    PT Start Time  0935    PT Stop Time  1015    PT Time Calculation (min)  40 min    Activity Tolerance  Patient tolerated treatment well       Past Medical History:  Diagnosis Date  . Migraine   . Sinusitis     Past Surgical History:  Procedure Laterality Date  . NO PAST SURGERIES    . stitches to abdomen      for a cut     There were no vitals filed for this visit.  Subjective Assessment - 12/12/19 0937    Subjective  Things are the same.  Had dental surgery.  It's going to take 6 months to heal from it.   6/10 right low back today.    Pertinent History  dizziness with lying down, turning in bed (states lots of tests done "they don't know what the problem is") reports tends to have low BP;  anxiety;  former gymnast    Currently in Pain?  Yes    Pain Score  6     Pain Location  Back    Aggravating Factors   all positions hurt                       [facility] Adult PT Treatment/Exercise - 12/12/19 0001      Lumbar Exercises: Seated   Other Seated Lumbar Exercises  ab brace 5x       Lumbar Exercises: Supine   Ab Set  10 reps    Isometric Hip Flexion  10 reps      Lumbar Exercises: Sidelying   Clam  Right;Left;10 reps      Lumbar Exercises: Prone   Straight Leg Raise  10 reps    Straight Leg Raises Limitations  over 1 pillow     Other Prone Lumbar Exercises  multifidi press 10x     Other Prone Lumbar Exercises  press with HS curls 5x        Moist Heat Therapy   Number Minutes Moist Heat  15 Minutes    Moist Heat Location  Lumbar Spine      Electrical Stimulation   Electrical Stimulation Location  lumbar     Electrical Stimulation Action  IFC     Electrical Stimulation Parameters  12 ma 15 min prone    Electrical Stimulation Goals  Pain             PT Education - 12/12/19 1118    Education Details  ccess Code: [email or web address] prone hip extension; clams, abdominal brace; ab brace hand to knee push;  TENS info    Person(s) Educated  Patient    Methods  Explanation;Demonstration;Handout    Comprehension  Returned demonstration;Verbalized understanding       PT Short Term Goals - 11/25/19 1858      PT SHORT TERM GOAL #1   Title  The patient  will express understanding of basic self care and HEP    Time  3    Period  Weeks    Status  New    Target Date  12/16/19      PT SHORT TERM GOAL #2   Title  The patient will report a 25% reduction in right low back pain and right posterior thigh /calf numbness    Time  3    Period  Weeks    Status  New      PT SHORT TERM GOAL #3   Title  FOTO functional outcome score will improve from 62% limitation to 52%    Time  3    Period  Weeks    Status  New        PT Long Term Goals - 11/25/19 1900      PT LONG TERM GOAL #1   Title  The patient will be independent in safe self progression of HEP    Time  8    Period  Weeks    Status  New    Target Date  01/20/20      PT LONG TERM GOAL #2   Title  The patient will have improved lumbar flexion to 60 degrees and extension to 20 degrees for taking care of her 31 year old and work duties    Time  8    Period  Weeks    Status  New      PT LONG TERM GOAL #3   Title  The patient will have improved bilateral hip abduction, extension and trunk strength to grossly 4/5 needed for standing and walking longer periods of time    Time  8    Period  Weeks    Status  New      PT LONG TERM GOAL #4   Title  The patient will report  improved sitting tolerance to sitting for 30-45 minutes needed for traveling in the car    Time  8    Period  Weeks    Status  New      PT LONG TERM GOAL #5   Title  FOTO functional outcome score improved from 62% limitation to 36%    Time  8    Period  Weeks    Status  New            Plan - 12/12/19 1121    Clinical Impression Statement  The patient is able to initiate low level core strengthening ex's in prone, sidelying and supine positions.  Moderate verbal and tactile cues to activate lumbar multifidi and transverse abdominus muscles along with coordination of breathing.  She has a positive response to ES/heat.  Therapist closely monitoring response with all interventions.  She denies dizziness with lying supine and turning over as she had on initial evaluation.    Examination-Participation Restrictions  Meal Prep;Community Activity;Driving;Shop;Other    Stability/Clinical Decision Making  Evolving/Moderate complexity    Rehab Potential  Good    PT Frequency  2x / week    PT Duration  8 weeks    PT Treatment/Interventions  ADLs/Self Care Home Management;Electrical Stimulation;Cryotherapy;Moist Heat;Iontophoresis 4mg /ml Dexamethasone;Ultrasound;Traction;Neuromuscular re-education;Therapeutic exercise;Therapeutic activities;Patient/family education;Manual techniques;Taping;Dry needling    PT Next Visit Plan  core stabilization low level; (dizziness with initially lying down and turning over); progression of  HEP;   ES/heat for pain control as needed    PT Home Exercise Plan  Access Code: [email or web address]       Patient  will benefit from skilled therapeutic intervention in order to improve the following deficits and impairments:  Decreased range of motion, Difficulty walking, Pain, Decreased activity tolerance, Decreased strength  Visit Diagnosis: Chronic right-sided low back pain with right-sided sciatica  Muscle weakness (generalized)  Paresthesia of skin     Problem  List Patient Active Problem List   Diagnosis Date Noted  . Chronic migraine without aura, with intractable migraine, so stated, with status migrainosus 08/06/2018   Ruben Im, PT 12/12/19 11:26 AM Phone: [phone] Fax: [phone] Alvera Singh 12/12/2019, 11:26 AM  [facility] [address] [place], [place], [address] Phone: [phone]   Fax:  [phone]  Name: Tabitha Smith MRN: [id number] Date of Birth: 04/04/1991

## 2019-12-16 ENCOUNTER — Other Ambulatory Visit: Payer: Self-pay

## 2019-12-16 ENCOUNTER — Ambulatory Visit: Payer: Medicaid Other | Admitting: Physical Therapy

## 2019-12-16 ENCOUNTER — Ambulatory Visit: Payer: Medicaid Other | Admitting: Neurology

## 2019-12-16 ENCOUNTER — Encounter: Payer: Self-pay | Admitting: Neurology

## 2019-12-16 VITALS — BP 112/69 | HR 64 | Temp 97.6°F | Ht 65.0 in | Wt 151.0 lb

## 2019-12-16 DIAGNOSIS — R202 Paresthesia of skin: Secondary | ICD-10-CM

## 2019-12-16 DIAGNOSIS — M5441 Lumbago with sciatica, right side: Secondary | ICD-10-CM | POA: Diagnosis not present

## 2019-12-16 DIAGNOSIS — G43711 Chronic migraine without aura, intractable, with status migrainosus: Secondary | ICD-10-CM | POA: Diagnosis not present

## 2019-12-16 DIAGNOSIS — G8929 Other chronic pain: Secondary | ICD-10-CM

## 2019-12-16 DIAGNOSIS — M6281 Muscle weakness (generalized): Secondary | ICD-10-CM

## 2019-12-16 MED ORDER — AJOVY 225 MG/1.5ML ~~LOC~~ SOAJ: 225.0000 mg | SUBCUTANEOUS | 11 refills | Status: DC

## 2019-12-16 MED ORDER — NURTEC 75 MG PO TBDP: 75.0000 mg | ORAL_TABLET | Freq: Every day | ORAL | 6 refills | Status: DC | PRN

## 2019-12-16 NOTE — Progress Notes (Signed)
[name]    Provider:  Dr Jaynee Eagles Referring Provider: No ref. provider found Primary Care Physician:  [facility]  CC:  Migraines   Interval history 12/16/2019:  Patient here for follow up, she ran out of aimovig and she couldn't get it. When she was on the aimovig she was doing great initially but Aimovig started wearing off and she would also have recurrence of migraines at the end of the month, but she hadn't taken it in several months, she is not planning on children. The migaines are back, we will restart but change to Ajovy due to wearing off of Aimovig.  As far as acute medications she tried imitrex, zomig nasal and maxalt.  We did have a long talk today, we will try to get Ajovy approved, she has tried multiple triptans we discussed other options and we decided on Nurtec.  I did give her some samples of Nurtec as well as Ajovy to get her through until we can get it approved.  Interval history; Patient doing exceptionally well on Aimovig. She has had only a few migraines since starting and one was when she was last for the Aimovig. Her baseline is 23 headache days a month.  15 migraine days a month. MRi brain normal.  She has drainage and chronic sinusitis. Reviewed images with patient, she has drainage and sinus symptoms, MRI showed: "mucoperiosteal thickening and some mucous retention cysts within the left maxillary sinus and large mucous retention cyst within the right maxillary sinus."  HPI:  Tabitha Smith is a 29 y.o. female here as requested by Dr. No ref. provider found for migraines. She has had them since a child. She missed lots of days of school in HS. Migraines/headaches worsening since having son 2 years ago. Migraines can be unilateral , eye pain, +photo/phonophobia, +nausea and vomiting, has to go into a dark room which helps, can't have any noise. Marland Kitchen Anxiety and stress makes it worse. 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  She also has intermittent dizziness with and without the headache. She feels like she just got off an elevator. When she lays down she gets a rush of dizziness and headache, positionally worse. She wakes in the morning with headaches, they have woken her up in the middle of the night. She is having numbness on the left side of her face. Not having anymore children. Not sexually active and uses birth control. No aura. No medication overuse.    Medications tried: Sumatriptan, Topiramate, Propranolol, Nortriptyline, Fioricet, she has tried "everything", Zomig nasal, maxalt,  Reviewed notes, labs and imaging from outside physicians, which showed:  Reviewed notes from referring physician Dr. Blenda Nicely.  Patient was seen in ear nose and throat for the evaluation of dizziness.  The problem intermittent.  Worse with moving around and last a few seconds.  Started 3 weeks prior to visit which was July 22, 2018.  Feels like she just got off an elevator, no hearing loss, no vertigo or room spinning, no voice changes she does report tingling in the hands and headaches with pain in the sides of the heads, with headaches there is photophobia and phonophobia, she is has history of migraines in the past.  Antinausea medications did not help.  Reviewed exam and work-up which was unremarkable.  Patient was scheduled for Dix-Hallpike with lenses and audiometric testing, and referred to neurology suspecting likely migraine disorder.   Review of Systems: Patient complains of symptoms per  HPI as well as the following symptoms: dizziness, headaches, insomnia, fatigue, worsening migraines. Pertinent negatives and positives per HPI. All others negative.   Social History   Socioeconomic History  . Marital status: Single    Spouse name: Not on file  . Number of children: 1  . Years of education: Not on file  . Highest education level: High school graduate  Occupational History  . Not on file  Tobacco Use  .  Smoking status: Current Every Day Smoker    Packs/day: 0.10    Types: Cigarettes  . Smokeless tobacco: Never Used  . Tobacco comment: 1/2 pack a week now   Substance and Sexual Activity  . Alcohol use: Yes    Comment: occasionally  . Drug use: Never  . Sexual activity: Not on file  Other Topics Concern  . Not on file  Social History Narrative   Lives at home with family   Left handed   Caffeine: coffee, 2-4 cups/day   Social Determinants of Health   Financial Resource Strain:   . Difficulty of Paying Living Expenses: Not on file  Food Insecurity:   . Worried About Programme researcher, broadcasting/film/video in the Last Year: Not on file  . Ran Out of Food in the Last Year: Not on file  Transportation Needs:   . Lack of Transportation (Medical): Not on file  . Lack of Transportation (Non-Medical): Not on file  Physical Activity:   . Days of Exercise per Week: Not on file  . Minutes of Exercise per Session: Not on file  Stress:   . Feeling of Stress : Not on file  Social Connections:   . Frequency of Communication with Friends and Family: Not on file  . Frequency of Social Gatherings with Friends and Family: Not on file  . Attends Religious Services: Not on file  . Active Member of Clubs or Organizations: Not on file  . Attends Banker Meetings: Not on file  . Marital Status: Not on file  Intimate Partner Violence:   . Fear of Current or Ex-Partner: Not on file  . Emotionally Abused: Not on file  . Physically Abused: Not on file  . Sexually Abused: Not on file    Family History  Problem Relation Age of Onset  . Diabetes Mother   . Seizures Mother   . Cancer Mother        Colon  . Hypertension Mother   . Migraines Neg Hx   . Headache Neg Hx     Past Medical History:  Diagnosis Date  . Migraine   . Sinusitis     Past Surgical History:  Procedure Laterality Date  . NO PAST SURGERIES    . stitches to abdomen      for a cut     Current Outpatient Medications    Medication Sig Dispense Refill  . Erenumab-aooe (AIMOVIG) 140 MG/ML SOAJ Inject 140 mg into the skin every 30 (thirty) days. Must be seen for further refills. Call [phone]. 1 pen 1  . SUMAtriptan (IMITREX) 6 MG/0.5ML SOLN injection Take one dose at headache onset, can take additional dose 2hrs later if needed. No more then 2 injections in 24hrs 6 vial 6  . zolpidem (AMBIEN) 10 MG tablet Take 10 mg by mouth at bedtime.    . Fremanezumab-vfrm (AJOVY) 225 MG/1.5ML SOAJ Inject 225 mg into the skin every 30 (thirty) days. 1 pen 11  . Rimegepant Sulfate (NURTEC) 75 MG TBDP Take 75 mg by  mouth daily as needed. For migraines. Take as close to onset of migraine as possible. One daily maximum. 10 tablet 6   No current facility-administered medications for this visit.    Allergies as of 12/16/2019 - Review Complete 12/16/2019  Allergen Reaction Noted  . Amoxicillin Anaphylaxis 09/10/2017  . Penicillins Anaphylaxis 06/19/2016    Vitals: BP 112/69 (BP Location: Left Arm, Patient Position: Sitting)   Pulse 64   Temp 97.6 F (36.4 C) Comment: taken at front  Ht 5\' 5"  (1.651 m)   Wt 151 lb (68.5 kg)   BMI 25.13 kg/m  Last Weight:  Wt Readings from Last 1 Encounters:  12/16/19 151 lb (68.5 kg)   Last Height:   Ht Readings from Last 1 Encounters:  12/16/19 5\' 5"  (1.651 m)   Physical exam: Exam: Gen: NAD, conversant, well nourised, well groomed                     CV: RRR, no MRG. No Carotid Bruits. No peripheral edema, warm, nontender Eyes: Conjunctivae clear without exudates or hemorrhage  Neuro: Detailed Neurologic Exam  Speech:    Speech is normal; fluent and spontaneous with normal comprehension.  Cognition:    The patient is oriented to person, place, and time;     recent and remote memory intact;     language fluent;     normal attention, concentration,     fund of knowledge Cranial Nerves:    The pupils are equal, round, and reactive to light. The fundi are normal and  spontaneous venous pulsations are present. Visual fields are full to finger confrontation. Extraocular movements are intact. Trigeminal sensation is intact and the muscles of mastication are normal. The face is symmetric. The palate elevates in the midline. Hearing intact. Voice is normal. Shoulder shrug is normal. The tongue has normal motion without fasciculations.   Coordination:    Normal finger to nose and heel to shin. Normal rapid alternating movements.   Gait:    Heel-toe and tandem gait are normal.   Motor Observation:    No asymmetry, no atrophy, and no involuntary movements noted. Tone:    Normal muscle tone.    Posture:    Posture is normal. normal erect    Strength:    Strength is V/V in the upper and lower limbs.      Sensation: intact to LT     Reflex Exam:  DTR's:    Deep tendon reflexes in the upper and lower extremities are symmetrical bilaterally.   Toes:    The toes are downgoing bilaterally.   Clonus:    Clonus is absent.       Assessment/Plan:  29 year old with chronic intractable headaches and acute on chronic maxillary sinusitis.   -Patient was doing well and Aimovig, is started wearing off and not doing as well, also she was having a difficult time getting it.  We will give her some samples today of Ajovy, we will try to get that approved, we have seen Aimovig does not last as long and people do have recurrent headaches at the end of the month and also it has some immunogenicity may be that is why is not working as well we will switch to Ajovy. 23 headache days a month.  15 migraine days a month  -We also discussed acute management medications, we reviewed, she has failed multiple triptans, at this time we will try to get her Nurtec approved.  -  Reviewed  normal MRI  images with patient, she has drainage and sinus symptoms, MRI showed: "mucoperiosteal thickening and some mucous retention cysts within the left maxillary sinus and large mucous retention  cyst within the right maxillary sinus."  - See above, will refer to Dr. Haroldine Laws for chronic sinusitis that shows on MRi with large cysts in the maxillary sinuses and thickening  - She couldn't get the zomig nasal spray. She uses BC powder but rarely bc freq is so reduced.   Meds ordered this encounter  Medications  . Fremanezumab-vfrm (AJOVY) 225 MG/1.5ML SOAJ    Sig: Inject 225 mg into the skin every 30 (thirty) days.    Dispense:  1 pen    Refill:  11  . Rimegepant Sulfate (NURTEC) 75 MG TBDP    Sig: Take 75 mg by mouth daily as needed. For migraines. Take as close to onset of migraine as possible. One daily maximum.    Dispense:  10 tablet    Refill:  6    A total of 40 minutes was spent face-to-face with this patient. Over half this time was spent on counseling patient on the  1. Chronic migraine without aura, with intractable migraine, so stated, with status migrainosus    diagnosis and different diagnostic and therapeutic options, counseling and coordination of care, risks ans benefits of management, compliance, or risk factor reduction and education.    Naomie Dean, MD  [facility] [address] [place], [place] [address]  Phone [phone] Fax [phone]

## 2019-12-16 NOTE — Therapy (Signed)
[facility] [address] [place], [place], [address] Phone: [phone]   Fax:  [phone]  Physical Therapy Treatment  Patient Details  Name: Tabitha Smith MRN: [id number] Date of Birth: Mar 24, 1991 Referring Provider (PT): Merlene Pulling PA   Encounter Date: 12/16/2019  PT End of Session - 12/16/19 1444    Visit Number  3    Number of Visits  4    Date for PT Re-Evaluation  01/20/20    Authorization Type  Medicaid 2/2-2/22 Eval + 3 visits    PT Start Time  5093    PT Stop Time  1450    PT Time Calculation (min)  47 min    Activity Tolerance  Patient tolerated treatment well       Past Medical History:  Diagnosis Date  . Migraine   . Sinusitis     Past Surgical History:  Procedure Laterality Date  . NO PAST SURGERIES    . stitches to abdomen      for a cut     There were no vitals filed for this visit.  Subjective Assessment - 12/16/19 1400    Subjective  My back is stiff from the cold.  I have neck pain instead of back pain.    Pertinent History  dizziness with lying down, turning in bed (states lots of tests done "they don't know what the problem is") reports tends to have low BP;  anxiety;  former gymnast    Currently in Pain?  No/denies    Pain Score  0-No pain    Pain Location  Back                       [facility] Adult PT Treatment/Exercise - 12/16/19 0001      Lumbar Exercises: Standing   Row  Strengthening;Both;15 reps;Theraband    Theraband Level (Row)  Level 3 (Green)    Shoulder Extension  Strengthening;Both;15 reps;Theraband    Theraband Level (Shoulder Extension)  Level 3 (Green)      Lumbar Exercises: Seated   Sit to Stand  15 reps    Sit to Stand Limitations  blue band around thighs and ball between knees for 10 reps to correct form       Lumbar Exercises: Sidelying   Clam  Right;Left;10 reps    Clam Limitations  blue band resist       Lumbar Exercises: Quadruped    Single Arm Raise  Right;Left;5 reps    Opposite Arm/Leg Raise  Right arm/Left leg;Left arm/Right leg;5 reps      Moist Heat Therapy   Number Minutes Moist Heat  12 Minutes    Moist Heat Location  Lumbar Spine      Electrical Stimulation   Electrical Stimulation Location  lumbar     Electrical Stimulation Action  IFC     Electrical Stimulation Parameters  12 ma 15 min     Electrical Stimulation Goals  Pain             PT Education - 12/16/19 1443    Education Details  Access Code: [email or web address]  sit to stand; bird dogs; green band rows, green band extensions    Person(s) Educated  Patient    Methods  Explanation;Demonstration;Handout    Comprehension  Returned demonstration;Verbalized understanding       PT Short Term Goals - 11/25/19 1858      PT SHORT TERM GOAL #1   Title  The  patient will express understanding of basic self care and HEP    Time  3    Period  Weeks    Status  New    Target Date  12/16/19      PT SHORT TERM GOAL #2   Title  The patient will report a 25% reduction in right low back pain and right posterior thigh /calf numbness    Time  3    Period  Weeks    Status  New      PT SHORT TERM GOAL #3   Title  FOTO functional outcome score will improve from 62% limitation to 52%    Time  3    Period  Weeks    Status  New        PT Long Term Goals - 11/25/19 1900      PT LONG TERM GOAL #1   Title  The patient will be independent in safe self progression of HEP    Time  8    Period  Weeks    Status  New    Target Date  01/20/20      PT LONG TERM GOAL #2   Title  The patient will have improved lumbar flexion to 60 degrees and extension to 20 degrees for taking care of her 31 year old and work duties    Time  8    Period  Weeks    Status  New      PT LONG TERM GOAL #3   Title  The patient will have improved bilateral hip abduction, extension and trunk strength to grossly 4/5 needed for standing and walking longer periods of time    Time  8     Period  Weeks    Status  New      PT LONG TERM GOAL #4   Title  The patient will report improved sitting tolerance to sitting for 30-45 minutes needed for traveling in the car    Time  8    Period  Weeks    Status  New      PT LONG TERM GOAL #5   Title  FOTO functional outcome score improved from 62% limitation to 36%    Time  8    Period  Weeks    Status  New            Plan - 12/16/19 1440    Clinical Impression Statement  The patient has dizziness with transitioning from seated to sidelying and with rolling over.  +nystagmus with about 30 sec to dissipate.  Discussed referral to a vestibular PT after back rehab.  She has signficant core weakness with difficulty stabilizing in quadruped.  Moderate verbal and tactile cues and biofeedback to avoid knee adduction/internal rotation with sit to stand.  She needs UEs forward to counter balance.  Therapist providing close supervision for safety in quadruped on mat table and to monitor response with all.    Comorbidities  dizziness with lying down; anxiety;  chronicity    Rehab Potential  Good    PT Frequency  2x / week    PT Duration  8 weeks    PT Treatment/Interventions  ADLs/Self Care Home Management;Electrical Stimulation;Cryotherapy;Moist Heat;Iontophoresis 4mg /ml Dexamethasone;Ultrasound;Traction;Neuromuscular re-education;Therapeutic exercise;Therapeutic activities;Patient/family education;Manual techniques;Taping;Dry needling    PT Next Visit Plan  check progress with goals;  core stabilization low level; (dizziness with initially lying down and turning over); progression of  HEP;   ES/heat for pain control as needed  PT Home Exercise Plan  Access Code: [email or web address]       Patient will benefit from skilled therapeutic intervention in order to improve the following deficits and impairments:  Decreased range of motion, Difficulty walking, Pain, Decreased activity tolerance, Decreased strength  Visit Diagnosis: Chronic right-sided  low back pain with right-sided sciatica  Muscle weakness (generalized)  Paresthesia of skin     Problem List Patient Active Problem List   Diagnosis Date Noted  . Chronic migraine without aura, with intractable migraine, so stated, with status migrainosus 08/06/2018   Lavinia Sharps, PT 12/16/19 2:59 PM Phone: [phone] Fax: [phone] Vivien Presto 12/16/2019, 2:58 PM  [facility] [address] [place], [place], [address] Phone: [phone]   Fax:  [phone]  Name: Sheleen Conchas MRN: [id number] Date of Birth: [birth year]

## 2019-12-16 NOTE — Patient Instructions (Signed)
Access Code: [email or web address]  URL: [email or web address]  Date: 12/16/2019  Prepared by: Lavinia Sharps   Exercises Prone Hip Extension - One Pillow - 10 reps - 1 sets - 1x daily - 7x weekly Clamshell - 10 reps - 1 sets - 1x daily - 7x weekly Supine Transversus Abdominis Bracing - Hands on Stomach - 10 reps - 1 sets - 1x daily - 7x weekly Hooklying Isometric Hip Flexion - 10 reps - 1 sets - 1x daily - 7x weekly Bird Dog - 10 reps - 1 sets - 1x daily - 7x weekly Sit to Stand without Arm Support - 10 reps - 1 sets - 1x daily - 7x weekly Standing Row with Anchored Resistance - 10 reps - 1 sets - 1x daily - 7x weekly Shoulder extension with resistance - Neutral - 10 reps - 1 sets - 1x daily - 7x weekly

## 2019-12-16 NOTE — Patient Instructions (Signed)
Stop aimovig Start ajovy nurtec daily as needed  Rimegepant oral dissolving tablet What is this medicine? RIMEGEPANT (ri ME je pant) is used to treat migraine headaches with or without aura. An aura is a strange feeling or visual disturbance that warns you of an attack. It is not used to prevent migraines. This medicine may be used for other purposes; ask your health care provider or pharmacist if you have questions. COMMON BRAND NAME(S): NURTEC ODT What should I tell my health care provider before I take this medicine? They need to know if you have any of these conditions:  kidney disease  liver disease  an unusual or allergic reaction to rimegepant, other medicines, foods, dyes, or preservatives  pregnant or trying to get pregnant  breast-feeding How should I use this medicine? Take the medicine by mouth. Follow the directions on the prescription label. Leave the tablet in the sealed blister pack until you are ready to take it. With dry hands, open the blister and gently remove the tablet. If the tablet breaks or crumbles, throw it away and take a new tablet out of the blister pack. Place the tablet in the mouth and allow it to dissolve, and then swallow. Do not cut, crush, or chew this medicine. You do not need water to take this medicine. Talk to your pediatrician about the use of this medicine in children. Special care may be needed. Overdosage: If you think you have taken too much of this medicine contact a poison control center or emergency room at once. NOTE: This medicine is only for you. Do not share this medicine with others. What if I miss a dose? This does not apply. This medicine is not for regular use. What may interact with this medicine? This medicine may interact with the following medications:  certain medicines for fungal infections like fluconazole, itraconazole  rifampin This list may not describe all possible interactions. Give your health care provider a list  of all the medicines, herbs, non-prescription drugs, or dietary supplements you use. Also tell them if you smoke, drink alcohol, or use illegal drugs. Some items may interact with your medicine. What should I watch for while using this medicine? Visit your health care professional for regular checks on your progress. Tell your health care professional if your symptoms do not start to get better or if they get worse. What side effects may I notice from receiving this medicine? Side effects that you should report to your doctor or health care professional as soon as possible:  allergic reactions like skin rash, itching or hives; swelling of the face, lips, or tongue Side effects that usually do not require medical attention (report these to your doctor or health care professional if they continue or are bothersome):  nausea This list may not describe all possible side effects. Call your doctor for medical advice about side effects. You may report side effects to FDA at 1-800-FDA-1088. Where should I keep my medicine? Keep out of the reach of children. Store at room temperature between 15 and 30 degrees C (59 and 86 degrees F). Throw away any unused medicine after the expiration date. NOTE: This sheet is a summary. It may not cover all possible information. If you have questions about this medicine, talk to your doctor, pharmacist, or health care provider.  2020 Elsevier/Gold Standard (2018-12-30 00:21:31) Rolanda Lundborg injection What is this medicine? FREMANEZUMAB (fre ma NEZ ue mab) is used to prevent migraine headaches. This medicine may be used for other purposes;  ask your health care provider or pharmacist if you have questions. COMMON BRAND NAME(S): AJOVY What should I tell my health care provider before I take this medicine? They need to know if you have any of these conditions:  an unusual or allergic reaction to fremanezumab, other medicines, foods, dyes, or preservatives  pregnant or  trying to get pregnant  breast-feeding How should I use this medicine? This medicine is for injection under the skin. You will be taught how to prepare and give this medicine. Use exactly as directed. Take your medicine at regular intervals. Do not take your medicine more often than directed. It is important that you put your used needles and syringes in a special sharps container. Do not put them in a trash can. If you do not have a sharps container, call your pharmacist or healthcare provider to get one. Talk to your pediatrician regarding the use of this medicine in children. Special care may be needed. Overdosage: If you think you have taken too much of this medicine contact a poison control center or emergency room at once. NOTE: This medicine is only for you. Do not share this medicine with others. What if I miss a dose? If you miss a dose, take it as soon as you can. If it is almost time for your next dose, take only that dose. Do not take double or extra doses. What may interact with this medicine? Interactions are not expected. This list may not describe all possible interactions. Give your health care provider a list of all the medicines, herbs, non-prescription drugs, or dietary supplements you use. Also tell them if you smoke, drink alcohol, or use illegal drugs. Some items may interact with your medicine. What should I watch for while using this medicine? Tell your doctor or healthcare professional if your symptoms do not start to get better or if they get worse. What side effects may I notice from receiving this medicine? Side effects that you should report to your doctor or health care professional as soon as possible:  allergic reactions like skin rash, itching or hives, swelling of the face, lips, or tongue Side effects that usually do not require medical attention (report these to your doctor or health care professional if they continue or are bothersome):  pain, redness, or  irritation at site where injected This list may not describe all possible side effects. Call your doctor for medical advice about side effects. You may report side effects to FDA at 1-800-FDA-1088. Where should I keep my medicine? Keep out of the reach of children. You will be instructed on how to store this medicine. Throw away any unused medicine after the expiration date on the label. NOTE: This sheet is a summary. It may not cover all possible information. If you have questions about this medicine, talk to your doctor, pharmacist, or health care provider.  2020 Elsevier/Gold Standard (2017-07-16 17:22:56)

## 2019-12-18 ENCOUNTER — Telehealth: Payer: Self-pay | Admitting: *Deleted

## 2019-12-18 ENCOUNTER — Encounter: Payer: Self-pay | Admitting: *Deleted

## 2019-12-18 NOTE — Telephone Encounter (Signed)
Completed Ajovy PA on [facility] [employer] portal. Approved immediately. Pt sent update via mychart.  Confirmation #: [id number] W Prior Approval #: [id number] Status: APPROVED

## 2019-12-23 ENCOUNTER — Telehealth: Payer: Self-pay | Admitting: *Deleted

## 2019-12-23 NOTE — Telephone Encounter (Signed)
Completed Nurtec PA over the phone with [facility] [employer]. Pending PA for 24 hours. Confirmation # [id number], Interaction ID # [id number].

## 2019-12-25 ENCOUNTER — Encounter: Payer: Self-pay | Admitting: *Deleted

## 2019-12-25 NOTE — Telephone Encounter (Signed)
Per [facility] [employer] portal, Nurtec denied. Confirmation #: [id number] P. No reason provided.

## 2019-12-25 NOTE — Telephone Encounter (Signed)
I will discuss with her at next appointment thanks

## 2019-12-25 NOTE — Telephone Encounter (Signed)
Notified pt via mychart

## 2020-01-01 ENCOUNTER — Other Ambulatory Visit: Payer: Self-pay

## 2020-01-01 ENCOUNTER — Encounter (HOSPITAL_COMMUNITY): Payer: Self-pay

## 2020-01-01 ENCOUNTER — Emergency Department (HOSPITAL_COMMUNITY)
Admission: EM | Admit: 2020-01-01 | Discharge: 2020-01-01 | Disposition: A | Discharge status: Home / Self Care | Payer: Medicaid Other | Attending: Emergency Medicine | Admitting: Emergency Medicine

## 2020-01-01 DIAGNOSIS — B373 Candidiasis of vulva and vagina: Secondary | ICD-10-CM | POA: Diagnosis not present

## 2020-01-01 DIAGNOSIS — B3731 Acute candidiasis of vulva and vagina: Secondary | ICD-10-CM

## 2020-01-01 DIAGNOSIS — F17210 Nicotine dependence, cigarettes, uncomplicated: Secondary | ICD-10-CM | POA: Diagnosis not present

## 2020-01-01 DIAGNOSIS — R1110 Vomiting, unspecified: Secondary | ICD-10-CM | POA: Diagnosis present

## 2020-01-01 DIAGNOSIS — B9689 Other specified bacterial agents as the cause of diseases classified elsewhere: Secondary | ICD-10-CM

## 2020-01-01 DIAGNOSIS — E876 Hypokalemia: Secondary | ICD-10-CM | POA: Diagnosis not present

## 2020-01-01 DIAGNOSIS — N760 Acute vaginitis: Secondary | ICD-10-CM | POA: Insufficient documentation

## 2020-01-01 DIAGNOSIS — R112 Nausea with vomiting, unspecified: Secondary | ICD-10-CM

## 2020-01-01 DIAGNOSIS — R42 Dizziness and giddiness: Secondary | ICD-10-CM | POA: Diagnosis not present

## 2020-01-01 LAB — URINALYSIS, ROUTINE W REFLEX MICROSCOPIC
Bilirubin Urine: NEGATIVE
Glucose, UA: NEGATIVE mg/dL
Hgb urine dipstick: NEGATIVE
Ketones, ur: NEGATIVE mg/dL
Leukocytes,Ua: NEGATIVE
Nitrite: NEGATIVE
Protein, ur: NEGATIVE mg/dL
Specific Gravity, Urine: 1.015 (ref 1.005–1.030)
pH: 7 (ref 5.0–8.0)

## 2020-01-01 LAB — WET PREP, GENITAL
Sperm: NONE SEEN
Trich, Wet Prep: NONE SEEN

## 2020-01-01 LAB — COMPREHENSIVE METABOLIC PANEL
ALT: 25 U/L (ref 0–44)
AST: 20 U/L (ref 15–41)
Albumin: 4 g/dL (ref 3.5–5.0)
Alkaline Phosphatase: 38 U/L (ref 38–126)
Anion gap: 8 (ref 5–15)
BUN: 9 mg/dL (ref 6–20)
CO2: 24 mmol/L (ref 22–32)
Calcium: 9.1 mg/dL (ref 8.9–10.3)
Chloride: 105 mmol/L (ref 98–111)
Creatinine, Ser: 0.76 mg/dL (ref 0.44–1.00)
GFR calc Af Amer: >60 mL/min (ref >60)
GFR calc non Af Amer: >60 mL/min (ref >60)
Glucose, Bld: 80 mg/dL (ref 70–99)
Potassium: 3.4 mmol/L — ABNORMAL LOW (ref 3.5–5.1)
Sodium: 137 mmol/L (ref 135–145)
Total Bilirubin: 0.5 mg/dL (ref 0.3–1.2)
Total Protein: 7.4 g/dL (ref 6.5–8.1)

## 2020-01-01 LAB — CBC
HCT: 41.9 % (ref 36.0–46.0)
Hemoglobin: 13.2 g/dL (ref 12.0–15.0)
MCH: 28.8 pg (ref 26.0–34.0)
MCHC: 31.5 g/dL (ref 30.0–36.0)
MCV: 91.5 fL (ref 80.0–100.0)
Platelets: 270 10*3/uL (ref 150–400)
RBC: 4.58 MIL/uL (ref 3.87–5.11)
RDW: 14.2 % (ref 11.5–15.5)
WBC: 7.1 10*3/uL (ref 4.0–10.5)
nRBC: 0 % (ref 0.0–0.2)

## 2020-01-01 LAB — LIPASE, BLOOD: Lipase: 31 U/L (ref 11–51)

## 2020-01-01 LAB — PREGNANCY, URINE: Preg Test, Ur: NEGATIVE

## 2020-01-01 MED ORDER — SODIUM CHLORIDE 0.9 % IV BOLUS
1000.0000 mL | Freq: Once | INTRAVENOUS | Status: AC
  Administered 2020-01-01: 1000 mL via INTRAVENOUS

## 2020-01-01 MED ORDER — METRONIDAZOLE 500 MG PO TABS: 500.0000 mg | ORAL_TABLET | Freq: Two times a day (BID) | ORAL | 0 refills | Status: DC

## 2020-01-01 MED ORDER — ONDANSETRON 4 MG PO TBDP: 4.0000 mg | ORAL_TABLET | Freq: Three times a day (TID) | ORAL | 0 refills | Status: DC | PRN

## 2020-01-01 MED ORDER — MECLIZINE HCL 12.5 MG PO TABS: 12.5000 mg | ORAL_TABLET | Freq: Three times a day (TID) | ORAL | 0 refills | Status: DC | PRN

## 2020-01-01 MED ORDER — SODIUM CHLORIDE 0.9% FLUSH: 3.0000 mL | Freq: Once | INTRAVENOUS | Status: DC

## 2020-01-01 MED ORDER — ONDANSETRON HCL 4 MG/2ML IJ SOLN
4.0000 mg | Freq: Once | INTRAMUSCULAR | Status: AC
  Administered 2020-01-01: 4 mg via INTRAVENOUS
  Filled 2020-01-01: qty 2

## 2020-01-01 MED ORDER — POTASSIUM CHLORIDE CRYS ER 20 MEQ PO TBCR
20.0000 meq | EXTENDED_RELEASE_TABLET | Freq: Once | ORAL | Status: AC
  Administered 2020-01-01: 20 meq via ORAL
  Filled 2020-01-01: qty 1

## 2020-01-01 MED ORDER — MECLIZINE HCL 25 MG PO TABS
25.0000 mg | ORAL_TABLET | Freq: Once | ORAL | Status: AC
  Administered 2020-01-01: 25 mg via ORAL
  Filled 2020-01-01: qty 1

## 2020-01-01 MED ORDER — FLUCONAZOLE 150 MG PO TABS: 150.0000 mg | ORAL_TABLET | Freq: Every day | ORAL | 0 refills | Status: DC

## 2020-01-01 NOTE — Discharge Instructions (Addendum)
As discussed, your labs looked good today. Your wet prep was positive for yeast and clue cells which is indicative of bacterial vaginosis. I am sending you home with two medications. Take as prescribed. Do not drink alcohol while on the medication. You may want to wait until you finish clindamycin before you start the yeast treatment. I am also sending you home with zofran for nausea and meclizine for dizziness. If your symptoms do not improve by Monday, call your PCP to schedule an appointment. Continue to drink a lot of fluids and eat bland foods for the next few days. Return to the ER for new or worsening symptoms.

## 2020-01-01 NOTE — ED Triage Notes (Signed)
Pt presents with c/o vomiting since yesterday. Pt reports that she got a new dog yesterday, unsure if this is related but denies any dog allergies. Pt denies any pain at this time.

## 2020-01-01 NOTE — ED Notes (Signed)
An After Visit Summary was printed and given to the patient. Discharge instructions given and no further questions at this time. Pt denies dizziness at this time.

## 2020-01-01 NOTE — ED Notes (Signed)
Pt able to ambulate in room with no difficulty and denies dizziness. Pt currently ambulating to restroom unassisted with steady gait.

## 2020-01-01 NOTE — ED Provider Notes (Signed)
[facility] DEPT Provider Note   CSN: [id number] Arrival date & time: 01/01/20  1243     History Chief Complaint  Patient presents with  . Emesis    Tabitha Smith is a 29 y.o. female with a past medical history significant for migraines who presents to the ED due to gradual onset of worsening non-bloody, non-bilious emesis x 1 day. Patient admits to 10-15 episodes of emesis yesterday after any po intake. She notes that her emesis was "uncontrollable" until late this morning. Emesis is associated with dizziness and decreased appetite. Patient notes that dizziness started 1 week ago after starting Clindamycin from her dentist. She describes the dizziness as "feeling drunk" which is worse with any head movement. Denies unilateral weakness, changes to speech, changes to vision. Patient denies diarrhea, fever, chills, abdominal pain. Denies COVID exposures and sick contacts. She notes she just got a new dog yesterday when her vomiting started. No alleviating or aggravating factors. Denies marijuana usage.   History obtained from patient and past medical records. No interpreter used during encounter.       Past Medical History:  Diagnosis Date  . Migraine   . Sinusitis     Patient Active Problem List   Diagnosis Date Noted  . Chronic migraine without aura, with intractable migraine, so stated, with status migrainosus 08/06/2018    Past Surgical History:  Procedure Laterality Date  . NO PAST SURGERIES    . stitches to abdomen      for a cut      OB History   No obstetric history on file.     Family History  Problem Relation Age of Onset  . Diabetes Mother   . Seizures Mother   . Cancer Mother        Colon  . Hypertension Mother   . Migraines Neg Hx   . Headache Neg Hx     Social History   Tobacco Use  . Smoking status: Current Every Day Smoker    Packs/day: 0.10    Types: Cigarettes  . Smokeless tobacco: Never Used  . Tobacco  comment: 1/2 pack a week now   Substance Use Topics  . Alcohol use: Yes    Comment: occasionally  . Drug use: Never    Home Medications Prior to Admission medications   Medication Sig Start Date End Date Taking? Authorizing Provider  Fremanezumab-vfrm (AJOVY) 225 MG/1.5ML SOAJ Inject 225 mg into the skin every 30 (thirty) days. 12/16/19  Yes Melvenia Beam, MD  Rimegepant Sulfate (NURTEC) 75 MG TBDP Take 75 mg by mouth daily as needed. For migraines. Take as close to onset of migraine as possible. One daily maximum. 12/16/19  Yes Melvenia Beam, MD  SUMAtriptan (IMITREX) 6 MG/0.5ML SOLN injection Take one dose at headache onset, can take additional dose 2hrs later if needed. No more then 2 injections in 24hrs 11/19/18  Yes Melvenia Beam, MD  zolpidem (AMBIEN) 10 MG tablet Take 10 mg by mouth at bedtime.   Yes [provider]  fluconazole (DIFLUCAN) 150 MG tablet Take 1 tablet (150 mg total) by mouth daily. 01/01/20   Suzy Bouchard, PA-C  meclizine (ANTIVERT) 12.5 MG tablet Take 1 tablet (12.5 mg total) by mouth 3 (three) times daily as needed for dizziness. 01/01/20   Suzy Bouchard, PA-C  metroNIDAZOLE (FLAGYL) 500 MG tablet Take 1 tablet (500 mg total) by mouth 2 (two) times daily. 01/01/20   Suzy Bouchard, PA-C  ondansetron (  ZOFRAN ODT) 4 MG disintegrating tablet Take 1 tablet (4 mg total) by mouth every 8 (eight) hours as needed for nausea or vomiting. 01/01/20   Mannie Stabile, PA-C    Allergies    Amoxicillin and Penicillins  Review of Systems   Review of Systems  Constitutional: Positive for appetite change. Negative for chills and fever.  Respiratory: Negative for shortness of breath.   Cardiovascular: Negative for chest pain.  Gastrointestinal: Positive for nausea and vomiting. Negative for abdominal pain and diarrhea.  Genitourinary: Positive for vaginal discharge. Negative for dysuria.  Neurological: Positive for dizziness. Negative for syncope,  weakness, numbness and headaches.  All other systems reviewed and are negative.   Physical Exam Updated Vital Signs BP 111/64   Pulse 60   Temp 98.4 F (36.9 C) (Oral)   Resp 18   LMP 12/18/2019 (Approximate)   SpO2 100%   Physical Exam Vitals and nursing note reviewed.  Constitutional:      General: She is not in acute distress.    Appearance: She is not toxic-appearing.  HENT:     Head: Normocephalic.  Eyes:     Extraocular Movements: Extraocular movements intact.     Pupils: Pupils are equal, round, and reactive to light.  Cardiovascular:     Rate and Rhythm: Normal rate and regular rhythm.     Pulses: Normal pulses.     Heart sounds: Normal heart sounds. No murmur. No friction rub. No gallop.   Pulmonary:     Effort: Pulmonary effort is normal.     Breath sounds: Normal breath sounds.  Abdominal:     General: Abdomen is flat. Bowel sounds are normal. There is no distension.     Palpations: Abdomen is soft.     Tenderness: There is no abdominal tenderness. There is no right CVA tenderness, left CVA tenderness, guarding or rebound.     Comments: Abdomen soft, nondistended, nontender to palpation in all quadrants without guarding or peritoneal signs. No rebound.   Musculoskeletal:     Cervical back: Neck supple.     Comments: Able to move all 4 extremities without difficulty. No lower extremity edema.   Skin:    General: Skin is warm and dry.  Neurological:     General: No focal deficit present.     Mental Status: She is alert.     Comments: Speech is clear, able to follow commands CN III-XII intact Normal strength in upper and lower extremities bilaterally including dorsiflexion and plantar flexion, strong and equal grip strength Sensation grossly intact throughout Moves extremities without ataxia, coordination intact No pronator drift Ambulates without difficulty  Psychiatric:        Mood and Affect: Mood normal.        Behavior: Behavior normal.     ED  Results / Procedures / Treatments   Labs (all labs ordered are listed, but only abnormal results are displayed) Labs Reviewed  WET PREP, GENITAL - Abnormal; Notable for the following components:      Result Value   Yeast Wet Prep HPF POC PRESENT (*)    Clue Cells Wet Prep HPF POC PRESENT (*)    WBC, Wet Prep HPF POC MODERATE (*)    All other components within normal limits  COMPREHENSIVE METABOLIC PANEL - Abnormal; Notable for the following components:   Potassium 3.4 (*)    All other components within normal limits  CBC  URINALYSIS, ROUTINE W REFLEX MICROSCOPIC  LIPASE, BLOOD  PREGNANCY, URINE  I-STAT  BETA HCG BLOOD, ED ([facility], [facility], [facility] ONLY)    EKG None  Radiology No results found.  Procedures Procedures (including critical care time)  Medications Ordered in ED Medications  sodium chloride flush (NS) 0.9 % injection 3 mL (has no administration in time range)  sodium chloride 0.9 % bolus 1,000 mL (0 mLs Intravenous Stopped 01/01/20 2104)  ondansetron (ZOFRAN) injection 4 mg (4 mg Intravenous Given 01/01/20 1659)  potassium chloride SA (KLOR-CON) CR tablet 20 mEq (20 mEq Oral Given 01/01/20 1837)  sodium chloride 0.9 % bolus 1,000 mL (0 mLs Intravenous Stopped 01/01/20 2104)  meclizine (ANTIVERT) tablet 25 mg (25 mg Oral Given 01/01/20 1938)    ED Course  I have reviewed the triage vital signs and the nursing notes.  Pertinent labs & imaging results that were available during my care of the patient were reviewed by me and considered in my medical decision making (see chart for details).  Clinical Course as of Jan 01 2119  Thu Jan 01, 2020  1753 Informed by RN that patient almost fainted while walking to the bathroom   [CA]  1806 Potassium(!): 3.4 [CA]  1806 Clue Cells Wet Prep HPF POC(!): PRESENT [CA]  1835 Yeast Wet Prep HPF POC(!): PRESENT [CA]  1835 WBC, Wet Prep HPF POC(!): MODERATE [CA]  2117 Preg Test, Ur: NEGATIVE [CA]    Clinical Course User Index [CA] Mannie Stabile, PA-C   MDM Rules/Calculators/A&P                     29 year old female presents to the ED due to nonbloody, nonbilious emesis x1 day associated with dizziness.  Denies marijuana use.  No Covid exposures or sick contacts.  Vitals all within normal limits.  Patient is afebrile, not tachycardic or hypoxic.  Patient in no acute distress and nontoxic-appearing.  Physical exam reassuring.  Abdomen soft, nondistended, nontender.  Normal neurological exam. Doubt CVA. Suspect dizziness related to dehydration vs. BPPV due to worsening after head movements. Will obtain routine labs and give IVFs and reassess patient.   CBC reassuring with no leukocytosis.  UA negative for signs of infection.  CMP reassuring with mild hypokalemia at 3.4. Potassium repleted here in the ED. Lipase normal at 31. Doubt pancreatitis. Patient self swabbed wet prep given she has no abdominal pain. Wet prep positive for yeast and clue cells. No concern for PID given no abdominal pain. Patient notes she is currently on Clindamycin, so suspect yeast is from antibiotic treatment. Denies sexual activity at this time with no concerns for STD. Discussed case with Dr. Estell Harpin who agrees with assessment and plan.   8:30 PM reassessed patient at bedside who notes that she feels improvement after the medication.  Patient denies current nausea, vomiting, or dizziness.  Patient able to ambulate in the ED without difficulty denies dizziness.  Discharge patient with Zofran and meclizine. Patient also discharged with flagyl and fluconazole for yeast and BV. Advised patient to not drink alcohol while on the medication. Advised patient to follow-up with PCP if symptoms do not improve within the next week. Strict ED precautions discussed with patient. Patient states understanding and agrees to plan. Patient discharged home in no acute distress and stable vitals  Final Clinical Impression(s) / ED Diagnoses Final diagnoses:  Candidal vaginitis    Bacterial vaginosis  Non-intractable vomiting with nausea, unspecified vomiting type    Rx / DC Orders ED Discharge Orders         Ordered  fluconazole (DIFLUCAN) 150 MG tablet  Daily     01/01/20 1849    metroNIDAZOLE (FLAGYL) 500 MG tablet  2 times daily     01/01/20 1849    ondansetron (ZOFRAN ODT) 4 MG disintegrating tablet  Every 8 hours PRN     01/01/20 2037    meclizine (ANTIVERT) 12.5 MG tablet  3 times daily PRN     01/01/20 2037           Jesusita Oka 01/01/20 2121    Bethann Berkshire, MD 01/02/20 1154

## 2020-02-12 ENCOUNTER — Ambulatory Visit: Payer: Medicaid Other | Attending: Family

## 2020-02-12 DIAGNOSIS — Z23 Encounter for immunization: Secondary | ICD-10-CM

## 2020-02-12 NOTE — Progress Notes (Signed)
   Covid-19 Vaccination Clinic  Name:  Tabitha Smith    MRN: [id number] DOB: 10-Sep-1991  02/12/2020  [name] was observed post Covid-19 immunization for 30 minutes based on pre-vaccination screening without incident. She was provided with Vaccine Information Sheet and instruction to access the V-Safe system.   [name] was instructed to call 911 with any severe reactions post vaccine: Marland Kitchen Difficulty breathing  . Swelling of face and throat  . A fast heartbeat  . A bad rash all over body  . Dizziness and weakness   Immunizations Administered    Name Date Dose VIS Date Route   Moderna COVID-19 Vaccine 02/12/2020 10:14 AM 0.5 mL 09/30/2019 Intramuscular   Manufacturer: Moderna   Lot: [id number]   NDC: 83358-251-89

## 2020-03-16 ENCOUNTER — Ambulatory Visit: Payer: Medicaid Other | Attending: Family

## 2020-03-16 DIAGNOSIS — Z23 Encounter for immunization: Secondary | ICD-10-CM

## 2020-03-16 NOTE — Progress Notes (Signed)
   Covid-19 Vaccination Clinic  Name:  Colleena Kurtenbach    MRN: [id number] DOB: [birth year]  03/16/2020  Ms. Scribner was observed post Covid-19 immunization for 30 minutes based on pre-vaccination screening without incident. She was provided with Vaccine Information Sheet and instruction to access the V-Safe system.   Ms. Hegg was instructed to call 911 with any severe reactions post vaccine: Marland Kitchen Difficulty breathing  . Swelling of face and throat  . A fast heartbeat  . A bad rash all over body  . Dizziness and weakness   Immunizations Administered    Name Date Dose VIS Date Route   Moderna COVID-19 Vaccine 03/16/2020  1:20 PM 0.5 mL 09/2019 Intramuscular   Manufacturer: Moderna   Lot: [id number]   NDC: 92010-071-21

## 2020-04-10 ENCOUNTER — Ambulatory Visit (HOSPITAL_COMMUNITY)
Admission: EM | Admit: 2020-04-10 | Discharge: 2020-04-10 | Disposition: A | Discharge status: Home / Self Care | Payer: Medicaid Other | Attending: Internal Medicine | Admitting: Internal Medicine

## 2020-04-10 ENCOUNTER — Encounter (HOSPITAL_COMMUNITY): Payer: Self-pay

## 2020-04-10 ENCOUNTER — Other Ambulatory Visit: Payer: Self-pay

## 2020-04-10 DIAGNOSIS — G43809 Other migraine, not intractable, without status migrainosus: Secondary | ICD-10-CM

## 2020-04-10 DIAGNOSIS — S29011A Strain of muscle and tendon of front wall of thorax, initial encounter: Secondary | ICD-10-CM

## 2020-04-10 MED ORDER — KETOROLAC TROMETHAMINE 60 MG/2ML IM SOLN
INTRAMUSCULAR | Status: AC
  Filled 2020-04-10: qty 2

## 2020-04-10 MED ORDER — KETOROLAC TROMETHAMINE 60 MG/2ML IM SOLN
60.0000 mg | Freq: Once | INTRAMUSCULAR | Status: AC
  Administered 2020-04-10: 60 mg via INTRAMUSCULAR

## 2020-04-10 MED ORDER — METHOCARBAMOL 500 MG PO TABS: 500.0000 mg | ORAL_TABLET | Freq: Four times a day (QID) | ORAL | 0 refills | Status: DC | PRN

## 2020-04-10 NOTE — Discharge Instructions (Addendum)
Take muscle relaxer as needed for chest/neck pain. No driving after taking this medication. Use heating pad several times per day for 15 minutes at a time.

## 2020-04-10 NOTE — ED Triage Notes (Signed)
Pt presents with right shoulder pain none injury related and migraine X 2 weeks.

## 2020-04-11 NOTE — ED Provider Notes (Signed)
[facility]    CSN: [id number] Arrival date & time: 04/10/20  1556      History   Chief Complaint Chief Complaint  Patient presents with  . Shoulder Pain  . Migraine    HPI Tabitha Smith is a 29 y.o. female with pmh of migraine headaches presents with right shoulder pain x 2 weeks with onset of migraine yesterday. Pt denies any known injury to shoulder. She works at [employer], but does not do a lot of heavy lifting. Upon further questioning it seems discomfort is in anterior shoulder and right sub clavicular area with discomfort upon rotation of neck. Migraine began yesterday and unrelieved by previously rx'd migraine medications. Headache described as right-sided, pulsatile with light sensitivity. Pt denies any recent fever, dizziness, chest pain, sob, abd pain, n/v/d.   Past Medical History:  Diagnosis Date  . Migraine   . Sinusitis     Patient Active Problem List   Diagnosis Date Noted  . Chronic migraine without aura, with intractable migraine, so stated, with status migrainosus 08/06/2018    Past Surgical History:  Procedure Laterality Date  . NO PAST SURGERIES    . stitches to abdomen      for a cut     OB History   No obstetric history on file.      Home Medications    Prior to Admission medications   Medication Sig Start Date End Date Taking? Authorizing Provider  fluconazole (DIFLUCAN) 150 MG tablet Take 1 tablet (150 mg total) by mouth daily. 01/01/20   Aberman, Merla Riches, PA-C  Fremanezumab-vfrm (AJOVY) 225 MG/1.5ML SOAJ Inject 225 mg into the skin every 30 (thirty) days. 12/16/19   Anson Fret, MD  meclizine (ANTIVERT) 12.5 MG tablet Take 1 tablet (12.5 mg total) by mouth 3 (three) times daily as needed for dizziness. 01/01/20   Mannie Stabile, PA-C  methocarbamol (ROBAXIN) 500 MG tablet Take 1 tablet (500 mg total) by mouth every 6 (six) hours as needed for muscle spasms. 04/10/20   Rolla Etienne, NP  metroNIDAZOLE (FLAGYL) 500 MG  tablet Take 1 tablet (500 mg total) by mouth 2 (two) times daily. 01/01/20   Mannie Stabile, PA-C  ondansetron (ZOFRAN ODT) 4 MG disintegrating tablet Take 1 tablet (4 mg total) by mouth every 8 (eight) hours as needed for nausea or vomiting. 01/01/20   Mannie Stabile, PA-C  Rimegepant Sulfate (NURTEC) 75 MG TBDP Take 75 mg by mouth daily as needed. For migraines. Take as close to onset of migraine as possible. One daily maximum. 12/16/19   Anson Fret, MD  SUMAtriptan (IMITREX) 6 MG/0.5ML SOLN injection Take one dose at headache onset, can take additional dose 2hrs later if needed. No more then 2 injections in 24hrs 11/19/18   Anson Fret, MD  zolpidem (AMBIEN) 10 MG tablet Take 10 mg by mouth at bedtime.    [provider]    Family History Family History  Problem Relation Age of Onset  . Diabetes Mother   . Seizures Mother   . Cancer Mother        Colon  . Hypertension Mother   . Migraines Neg Hx   . Headache Neg Hx     Social History Social History   Tobacco Use  . Smoking status: Current Every Day Smoker    Packs/day: 0.10    Types: Cigarettes  . Smokeless tobacco: Never Used  . Tobacco comment: 1/2 pack a week now  Vaping Use  . Vaping Use: Never used  Substance Use Topics  . Alcohol use: Yes    Comment: occasionally  . Drug use: Never     Allergies   Amoxicillin and Penicillins   Review of Systems As stated in hpi otherwise negative   Physical Exam Triage Vital Signs ED Triage Vitals [04/10/20 1630]  Enc Vitals Group     BP 119/81     Pulse Rate 76     Resp 18     Temp 98.6 F (37 C)     Temp Source Oral     SpO2 100 %     Weight      Height      Head Circumference      Peak Flow      Pain Score 7     Pain Loc      Pain Edu?      Excl. in [place]?    No data found.  Updated Vital Signs BP 119/81 (BP Location: Left Arm)   Pulse 76   Temp 98.6 F (37 C) (Oral)   Resp 18   LMP 03/30/2020   SpO2 100%   Visual  Acuity Right Eye Distance:   Left Eye Distance:   Bilateral Distance:    Right Eye Near:   Left Eye Near:    Bilateral Near:     Physical Exam Constitutional:      General: She is not in acute distress.    Appearance: Normal appearance. She is not ill-appearing.  HENT:     Head: Normocephalic and atraumatic.  Eyes:     Extraocular Movements: Extraocular movements intact.     Pupils: Pupils are equal, round, and reactive to light.  Musculoskeletal:     Right shoulder: Normal. No swelling, deformity or tenderness. Normal range of motion.     Cervical back: Normal range of motion and neck supple.     Comments: Tenderness on palpation of right upper pectoral muscle and trapezius muscle on right side.   Skin:    General: Skin is warm and dry.  Neurological:     General: No focal deficit present.     Mental Status: She is alert and oriented to person, place, and time.      UC Treatments / Results  Labs (all labs ordered are listed, but only abnormal results are displayed) Labs Reviewed - No data to display  EKG  Radiology No results found.  Medications Ordered in UC Medications  ketorolac (TORADOL) injection 60 mg (60 mg Intramuscular Given 04/10/20 1717)    Initial Impression / Assessment and Plan / UC Course  I have reviewed the triage vital signs and the nursing notes.  Pertinent labs & imaging results that were available during my care of the patient were reviewed by me and considered in my medical decision making (see chart for details).  Muscle strain -exam and hx c/w muscle strain -heating pad, robaxin prn. Stretches reviewed  Migraine headache -pt with hx of same -likely triggered by above issue and tx for above will help -Toradol IM   Final Clinical Impressions(s) / UC Diagnoses   Final diagnoses:  Pectoralis muscle strain, initial encounter  Other migraine without status migrainosus, not intractable     Discharge Instructions     Take muscle  relaxer as needed for chest/neck pain. No driving after taking this medication. Use heating pad several times per day for 15 minutes at a time.     ED Prescriptions  Medication Sig Dispense Auth. Provider   methocarbamol (ROBAXIN) 500 MG tablet Take 1 tablet (500 mg total) by mouth every 6 (six) hours as needed for muscle spasms. 30 tablet Rolla Etienne, NP     PDMP not reviewed this encounter.   Rolla Etienne, NP 04/11/20 2138

## 2020-06-13 NOTE — Progress Notes (Signed)
[name]    Provider:  Dr Lucia Gaskins Referring Provider: Carmel Sacramento, NP Primary Care Physician:  [facility]  CC:  Migraines    Interval history June 14, 2020: Patient was approved for Ajovy last appointment, from a review of records she was seen in the emergency room April 10, 2020 with right shoulder pain for 2 weeks with onset of migraine the day before, migraine began the day before and unrelieved by previous migraine medications, right-sided, pulsatile with light sensitivity, she denied any recent fever, dizziness, chest pain shortness of breath, abdominal pain or nausea vomiting, she has multiple medications that can be used for her migraines on her med list including Zofran, Nurtec, Imitrex, Ajovy,  She was given a Toradol injection.  She was given Robaxin as needed, exam consistent with muscle strain, also recommended she use heating pad, migraine likely triggered by this issue and Toradol IM helped and she was discharged with Robaxin.  Ajovy didn't help. Aimovig worked great but was wearing off by the end of the month We will prescriber her 70mg  twice daily. She did great on the Aimovig. Will try and get Nurtec approved she has failed multiple triptans including imitrex, rizatriptan, zolmitriptan, eletriptan. We discussed teratogenicity of medications, will check urine pregnancy test, asked her to always use condoms and backup. Discussed at length. Also discussed other options, botox, oral medications, acute and preventative measures.  Interval history 12/16/2019:  Patient here for follow up, she ran out of aimovig and she couldn't get it. When she was on the aimovig she was doing great initially but Aimovig started wearing off and she would also have recurrence of migraines at the end of the month, but she hadn't taken it in several months, she is not planning on children. The migaines are back, we will restart but change to Ajovy due to wearing off of Aimovig.   As far as acute medications she tried imitrex, zomig nasal and maxalt.  We did have a long talk today, we will try to get Ajovy approved, she has tried multiple triptans we discussed other options and we decided on Nurtec.  I did give her some samples of Nurtec as well as Ajovy to get her through until we can get it approved.  Interval history; Patient doing exceptionally well on Aimovig. She has had only a few migraines since starting and one was when she was last for the Aimovig. Her baseline is 23 headache days a month.  15 migraine days a month. MRi brain normal.  She has drainage and chronic sinusitis. Reviewed images with patient, she has drainage and sinus symptoms, MRI showed: "mucoperiosteal thickening and some mucous retention cysts within the left maxillary sinus and large mucous retention cyst within the right maxillary sinus."  HPI:  Tabitha Smith is a 29 y.o. female here as requested by Dr. 38 for migraines. She has had them since a child. She missed lots of days of school in HS. Migraines/headaches worsening since having son 2 years ago. Migraines can be unilateral , eye pain, +photo/phonophobia, +nausea and vomiting, has to go into a dark room which helps, can't have any noise. [place] Anxiety and stress makes it worse. Can affect her work, she works in [employer] and it is too loud. She also has intermittent dizziness with and without the headache. She feels like she just got off an elevator. When she lays down she gets a rush of dizziness and headache, positionally worse. She wakes in the morning with headaches,  they have woken her up in the middle of the night. She is having numbness on the left side of her face. Not having anymore children. Not sexually active and uses birth control. No aura. No medication overuse.    Medications tried: Sumatriptan, Topiramate, Propranolol, Nortriptyline, Fioricet, she has tried "everything", Zomig nasal, maxalt,  Reviewed notes, labs and imaging from  outside physicians, which showed:  Reviewed notes from referring physician Dr. Doran Heater.  Patient was seen in ear nose and throat for the evaluation of dizziness.  The problem intermittent.  Worse with moving around and last a few seconds.  Started 3 weeks prior to visit which was July 22, 2018.  Feels like she just got off an elevator, no hearing loss, no vertigo or room spinning, no voice changes she does report tingling in the hands and headaches with pain in the sides of the heads, with headaches there is photophobia and phonophobia, she is has history of migraines in the past.  Antinausea medications did not help.  Reviewed exam and work-up which was unremarkable.  Patient was scheduled for Dix-Hallpike with lenses and audiometric testing, and referred to neurology suspecting likely migraine disorder.   Review of Systems: Patient complains of symptoms per HPI as well as the following symptoms:shoulder pain, migraines . Pertinent negatives and positives per HPI. All others negative    Social History   Socioeconomic History  . Marital status: Single    Spouse name: Not on file  . Number of children: 1  . Years of education: Not on file  . Highest education level: High school graduate  Occupational History  . Not on file  Tobacco Use  . Smoking status: Current Every Day Smoker    Packs/day: 0.10    Types: Cigarettes  . Smokeless tobacco: Never Used  . Tobacco comment: 1/2 pack a week now   Vaping Use  . Vaping Use: Never used  Substance and Sexual Activity  . Alcohol use: Yes    Comment: occasionally  . Drug use: Never  . Sexual activity: Not on file  Other Topics Concern  . Not on file  Social History Narrative   Lives at home with family   Left handed   Caffeine: coffee, 2-4 cups/day   Social Determinants of Health   Financial Resource Strain:   . Difficulty of Paying Living Expenses:   Food Insecurity:   . Worried About Programme researcher, broadcasting/film/video in the Last Year:     . Barista in the Last Year:   Transportation Needs:   . Freight forwarder (Medical):   [employer] Lack of Transportation (Non-Medical):   Physical Activity:   . Days of Exercise per Week:   . Minutes of Exercise per Session:   Stress:   . Feeling of Stress :   Social Connections:   . Frequency of Communication with Friends and Family:   . Frequency of Social Gatherings with Friends and Family:   . Attends Religious Services:   . Active Member of Clubs or Organizations:   . Attends Banker Meetings:   [employer] Marital Status:   Intimate Partner Violence:   . Fear of Current or Ex-Partner:   . Emotionally Abused:   [employer] Physically Abused:   . Sexually Abused:     Family History  Problem Relation Age of Onset  . Diabetes Mother   . Seizures Mother   . Cancer Mother        Colon  . Hypertension  Mother   . Migraines Neg Hx   . Headache Neg Hx     Past Medical History:  Diagnosis Date  . Migraine   . Sinusitis     Past Surgical History:  Procedure Laterality Date  . NO PAST SURGERIES    . stitches to abdomen      for a cut     Current Outpatient Medications  Medication Sig Dispense Refill  . linaclotide (LINZESS) 145 MCG CAPS capsule Take 145 mcg by mouth daily before breakfast.    . mirtazapine (REMERON) 30 MG tablet Take 30 mg by mouth at bedtime.    . prazosin (MINIPRESS) 1 MG capsule Take 1 mg by mouth 2 (two) times daily.    . QUEtiapine (SEROQUEL) 100 MG tablet Take 100 mg by mouth at bedtime.    [employer] zolpidem (AMBIEN) 10 MG tablet Take 10 mg by mouth at bedtime.    Dorise Hiss (AIMOVIG) 70 MG/ML SOAJ Inject 70 mg into the skin every 14 (fourteen) days. 1.12 mL 6  . Rimegepant Sulfate (NURTEC) 75 MG TBDP Take 75 mg by mouth daily as needed. For migraines. Take as close to onset of migraine as possible. One daily maximum. 10 tablet 6   No current facility-administered medications for this visit.    Allergies as of 06/14/2020 - Review Complete  06/14/2020  Allergen Reaction Noted  . Amoxicillin Anaphylaxis 09/10/2017  . Penicillins Anaphylaxis 06/19/2016    Vitals: BP 130/74 (BP Location: Right Arm, Patient Position: Sitting)   Pulse 87   Ht 5\' 6"  (1.676 m)   Wt 185 lb (83.9 kg)   LMP 05/30/2020 Comment: possibly pregnant per pt report   BMI 29.86 kg/m  Last Weight:  Wt Readings from Last 1 Encounters:  06/14/20 185 lb (83.9 kg)   Last Height:   Ht Readings from Last 1 Encounters:  06/14/20 5\' 6"  (1.676 m)   Physical exam: Exam: Gen: NAD, conversant, well nourised, obese, well groomed                     CV: RRR, no MRG. No Carotid Bruits. No peripheral edema, warm, nontender Eyes: Conjunctivae clear without exudates or hemorrhage  Neuro: Detailed Neurologic Exam  Speech:    Speech is normal; fluent and spontaneous with normal comprehension.  Cognition:    The patient is oriented to person, place, and time;     recent and remote memory intact;     language fluent;     normal attention, concentration,     fund of knowledge Cranial Nerves:    The pupils are equal, round, and reactive to light. The fundi are normal and spontaneous venous pulsations are present. Visual fields are full to finger confrontation. Extraocular movements are intact. Trigeminal sensation is intact and the muscles of mastication are normal. The face is symmetric. The palate elevates in the midline. Hearing intact. Voice is normal. Shoulder shrug is normal. The tongue has normal motion without fasciculations.   Coordination:    Normal finger to nose and heel to shin. Normal rapid alternating movements.   Gait:    Heel-toe and tandem gait are normal.   Motor Observation:    No asymmetry, no atrophy, and no involuntary movements noted. Tone:    Normal muscle tone.    Posture:    Posture is normal. normal erect    Strength:    Strength is V/V in the upper and lower limbs.      Sensation: intact to  LT     Reflex Exam:  DTR's:     Deep tendon reflexes in the upper and lower extremities are normal bilaterally.   Toes:    The toes are downgoing bilaterally.   Clonus:    Clonus is absent.     Assessment/Plan:  29 year old with chronic intractable migraines  - Ajovy did not work. Will try Aimovig 70mg  twice monthly.  Ajovy didn't help. Aimovig worked great but was wearing off by the end of the month We will prescriber her 70mg  twice monthly. She did great on the Aimovig. Will try and get Nurtec approved she has failed multiple triptans including imitrexPO an dinjection, rizatriptan, zolmitriptanPO and nasal, eletriptan  - discussed teratogenicity do not get pregnant.  PRIOR:  -  Reviewed normal MRI  images with patient, she has drainage and sinus symptoms, MRI showed: "mucoperiosteal thickening and some mucous retention cysts within the left maxillary sinus and large mucous retention cyst within the right maxillary sinus."  - See above, referred in the past to Dr. for chronic sinusitis that shows on MRi with large cysts in the maxillary sinuses and thickening  - She couldn't get the zomig nasal spray. She uses BC powder but rarely bc freq is so reduced.   Meds ordered this encounter  Medications  . DISCONTD: Erenumab-aooe (AIMOVIG) 70 MG/ML SOAJ    Sig: Inject 70 mg into the skin every 30 (thirty) days. .    Dispense:  3.36 mL    Refill:  6    This is a 70-month supply. If insurance will pay for a 3 months supply then dispense 3 pens otherwise dispense 1 pen monthly with 12 refills  . Rimegepant Sulfate (NURTEC) 75 MG TBDP    Sig: Take 75 mg by mouth daily as needed. For migraines. Take as close to onset of migraine as possible. One daily maximum.    Dispense:  10 tablet    Refill:  6    Dispense max insurance allows  . Erenumab-aooe (AIMOVIG) 70 MG/ML SOAJ    Sig: Inject 70 mg into the skin every 14 (fourteen) days.    Dispense:  1.12 mL    Refill:  6      Orders Placed This Encounter  Procedures   . Pregnancy, urine     Haroldine Laws, MD  [facility] [address] [place], [address] [place]  Phone [phone] Fax [phone]  I spent more than 45 minutes of face-to-face and non-face-to-face time with patient on the  1. Chronic migraine without aura, with intractable migraine, so stated, with status migrainosus   2. Nausea    diagnosis.  This included previsit chart review, lab review, study review, order entry, electronic health record documentation, patient education on the different diagnostic and therapeutic options, counseling and coordination of care, risks and benefits of management, compliance, or risk factor reduction

## 2020-06-14 ENCOUNTER — Other Ambulatory Visit: Payer: Self-pay

## 2020-06-14 ENCOUNTER — Ambulatory Visit (INDEPENDENT_AMBULATORY_CARE_PROVIDER_SITE_OTHER): Payer: Medicaid Other | Admitting: Neurology

## 2020-06-14 ENCOUNTER — Encounter: Payer: Self-pay | Admitting: Neurology

## 2020-06-14 VITALS — BP 130/74 | HR 87 | Ht 66.0 in | Wt 185.0 lb

## 2020-06-14 DIAGNOSIS — R110 Nausea: Secondary | ICD-10-CM | POA: Diagnosis not present

## 2020-06-14 DIAGNOSIS — G43711 Chronic migraine without aura, intractable, with status migrainosus: Secondary | ICD-10-CM | POA: Diagnosis not present

## 2020-06-14 MED ORDER — AIMOVIG 70 MG/ML ~~LOC~~ SOAJ: 70.0000 mg | SUBCUTANEOUS | 6 refills | Status: DC

## 2020-06-14 MED ORDER — NURTEC 75 MG PO TBDP: 75.0000 mg | ORAL_TABLET | Freq: Every day | ORAL | 6 refills | Status: DC | PRN

## 2020-06-14 NOTE — Patient Instructions (Signed)
Aimovig every 2 weeks 70mg  Nurtec daily as needed  Rimegepant oral dissolving tablet What is this medicine? RIMEGEPANT (ri ME je pant) is used to treat migraine headaches with or without aura. An aura is a strange feeling or visual disturbance that warns you of an attack. It is not used to prevent migraines. This medicine may be used for other purposes; ask your health care provider or pharmacist if you have questions. COMMON BRAND NAME(S): NURTEC ODT What should I tell my health care provider before I take this medicine? They need to know if you have any of these conditions:  kidney disease  liver disease  an unusual or allergic reaction to rimegepant, other medicines, foods, dyes, or preservatives  pregnant or trying to get pregnant  breast-feeding How should I use this medicine? Take the medicine by mouth. Follow the directions on the prescription label. Leave the tablet in the sealed blister pack until you are ready to take it. With dry hands, open the blister and gently remove the tablet. If the tablet breaks or crumbles, throw it away and take a new tablet out of the blister pack. Place the tablet in the mouth and allow it to dissolve, and then swallow. Do not cut, crush, or chew this medicine. You do not need water to take this medicine. Talk to your pediatrician about the use of this medicine in children. Special care may be needed. Overdosage: If you think you have taken too much of this medicine contact a poison control center or emergency room at once. NOTE: This medicine is only for you. Do not share this medicine with others. What if I miss a dose? This does not apply. This medicine is not for regular use. What may interact with this medicine? This medicine may interact with the following medications:  certain medicines for fungal infections like fluconazole, itraconazole  rifampin This list may not describe all possible interactions. Give your health care provider a  list of all the medicines, herbs, non-prescription drugs, or dietary supplements you use. Also tell them if you smoke, drink alcohol, or use illegal drugs. Some items may interact with your medicine. What should I watch for while using this medicine? Visit your health care professional for regular checks on your progress. Tell your health care professional if your symptoms do not start to get better or if they get worse. What side effects may I notice from receiving this medicine? Side effects that you should report to your doctor or health care professional as soon as possible:  allergic reactions like skin rash, itching or hives; swelling of the face, lips, or tongue Side effects that usually do not require medical attention (report these to your doctor or health care professional if they continue or are bothersome):  nausea This list may not describe all possible side effects. Call your doctor for medical advice about side effects. You may report side effects to FDA at 1-800-FDA-1088. Where should I keep my medicine? Keep out of the reach of children. Store at room temperature between 15 and 30 degrees C (59 and 86 degrees F). Throw away any unused medicine after the expiration date. NOTE: This sheet is a summary. It may not cover all possible information. If you have questions about this medicine, talk to your doctor, pharmacist, or health care provider.  2020 Elsevier/Gold Standard (2018-12-30 00:21:31) Erenumab injection What is this medicine? ERENUMAB (e REN ue mab) is used to prevent migraine headaches. This medicine may be used for other purposes;  ask your health care provider or pharmacist if you have questions. COMMON BRAND NAME(S): Aimovig What should I tell my health care provider before I take this medicine? They need to know if you have any of these conditions:  an unusual or allergic reaction to erenumab, latex, other medicines, foods, dyes, or preservatives  high blood  pressure  pregnant or trying to get pregnant  breast-feeding How should I use this medicine? This medicine is for injection under the skin. You will be taught how to prepare and give this medicine. Use exactly as directed. Take your medicine at regular intervals. Do not take your medicine more often than directed. It is important that you put your used needles and syringes in a special sharps container. Do not put them in a trash can. If you do not have a sharps container, call your pharmacist or healthcare provider to get one. Talk to your pediatrician regarding the use of this medicine in children. Special care may be needed. Overdosage: If you think you have taken too much of this medicine contact a poison control center or emergency room at once. NOTE: This medicine is only for you. Do not share this medicine with others. What if I miss a dose? If you miss a dose, take it as soon as you can. If it is almost time for your next dose, take only that dose. Do not take double or extra doses. What may interact with this medicine? Interactions are not expected. This list may not describe all possible interactions. Give your health care provider a list of all the medicines, herbs, non-prescription drugs, or dietary supplements you use. Also tell them if you smoke, drink alcohol, or use illegal drugs. Some items may interact with your medicine. What should I watch for while using this medicine? Tell your doctor or healthcare professional if your symptoms do not start to get better or if they get worse. What side effects may I notice from receiving this medicine? Side effects that you should report to your doctor or health care professional as soon as possible:  allergic reactions like skin rash, itching or hives, swelling of the face, lips, or tongue  chest pain  fast, irregular heartbeat  feeling faint or lightheaded  palpitations Side effects that usually do not require medical attention  (report these to your doctor or health care professional if they continue or are bothersome):  constipation  muscle cramps  pain, redness, or irritation at site where injected This list may not describe all possible side effects. Call your doctor for medical advice about side effects. You may report side effects to FDA at 1-800-FDA-1088. Where should I keep my medicine? Keep out of the reach of children. You will be instructed on how to store this medicine. Throw away any unused medicine after the expiration date on the label. NOTE: This sheet is a summary. It may not cover all possible information. If you have questions about this medicine, talk to your doctor, pharmacist, or health care provider.  2020 Elsevier/Gold Standard (2019-03-03 15:43:58)

## 2020-06-15 ENCOUNTER — Telehealth: Payer: Self-pay | Admitting: Neurology

## 2020-06-15 LAB — PREGNANCY, URINE: Preg Test, Ur: NEGATIVE

## 2020-06-15 NOTE — Telephone Encounter (Signed)
PA for Aimovig 70mg  was started via . Key is [id number]. PA was approved starting 06/15/20 to 09/13/20. Will fax a copy of determination to pharmacy.

## 2020-06-22 ENCOUNTER — Telehealth: Payer: Self-pay

## 2020-06-22 NOTE — Telephone Encounter (Signed)
[facility] ([facility]) DENIED the Nurtec 75mg  stating:   "We did not see certain details about your use and treatment. We see that this is a request for a drug called Nurtec ODT 75mg  for your use (migraine with or without aura). We may consider approval of this drug when used in a certain situation (when you have had a headache frequency of less than 15 headache days per month during the prior 6 months). We did not see information that shows this applies to you. We based this decision on your health plan's prior authorization criteria named Migraine Therapy - Calcitonin Gene-Related Inhibitors."  [facility] can be reached at [phone].  PA Case: [facility]

## 2020-06-22 NOTE — Telephone Encounter (Signed)
I have submitted a PA request for Nurtec 75mg  on [facility], Key: [id number].   Tried: zofran, imitrex, ajovy, robaxin, aimovig, maxalt, zolmitriptan, eletriptan, topamax, propranolol, nortriptyline, fioricet.   [facility] [employer] Form [phone] NCPDP)  Please wait for [facility] to return a determination.

## 2020-06-22 NOTE — Telephone Encounter (Signed)
Noted, please let patient know thanks

## 2020-08-19 NOTE — Telephone Encounter (Addendum)
We received a request for authorization for Aimovig 70 mg q14 days. I attempted PA on [employer] My [employer]s Key: [id number]. Per [employer], authorization already on file for patient.

## 2020-08-30 DIAGNOSIS — Z8619 Personal history of other infectious and parasitic diseases: Secondary | ICD-10-CM

## 2020-08-30 DIAGNOSIS — I509 Heart failure, unspecified: Secondary | ICD-10-CM

## 2020-08-30 DIAGNOSIS — J189 Pneumonia, unspecified organism: Secondary | ICD-10-CM

## 2020-08-30 DIAGNOSIS — Z8679 Personal history of other diseases of the circulatory system: Secondary | ICD-10-CM

## 2020-08-30 HISTORY — DX: Pneumonia, unspecified organism: J18.9

## 2020-08-30 HISTORY — DX: Personal history of other diseases of the circulatory system: Z86.79

## 2020-08-30 HISTORY — DX: Personal history of other infectious and parasitic diseases: Z86.19

## 2020-08-30 HISTORY — DX: Heart failure, unspecified: I50.9

## 2020-09-01 ENCOUNTER — Telehealth: Payer: Self-pay | Admitting: Neurology

## 2020-09-01 NOTE — Telephone Encounter (Signed)
Received a PA renewal for Aimovig 70mg . Key is [id number]. PA was instantly approved until 09/01/21. Will fax a copy of the determination to patient's pharmacy.

## 2020-09-02 ENCOUNTER — Other Ambulatory Visit: Payer: Self-pay

## 2020-09-02 ENCOUNTER — Emergency Department (HOSPITAL_COMMUNITY): Payer: Medicaid Other

## 2020-09-02 ENCOUNTER — Observation Stay (HOSPITAL_COMMUNITY): Payer: Medicaid Other

## 2020-09-02 ENCOUNTER — Inpatient Hospital Stay (HOSPITAL_COMMUNITY)
Admission: EM | Admit: 2020-09-02 | Discharge: 2020-09-07 | DRG: 871 | Disposition: A | Discharge status: Home / Self Care | Payer: Medicaid Other | Attending: Family Medicine | Admitting: Family Medicine

## 2020-09-02 ENCOUNTER — Encounter (HOSPITAL_COMMUNITY): Payer: Self-pay | Admitting: Family Medicine

## 2020-09-02 ENCOUNTER — Observation Stay (HOSPITAL_BASED_OUTPATIENT_CLINIC_OR_DEPARTMENT_OTHER): Payer: Medicaid Other

## 2020-09-02 DIAGNOSIS — I340 Nonrheumatic mitral (valve) insufficiency: Secondary | ICD-10-CM

## 2020-09-02 DIAGNOSIS — E872 Acidosis: Secondary | ICD-10-CM | POA: Diagnosis present

## 2020-09-02 DIAGNOSIS — I5181 Takotsubo syndrome: Secondary | ICD-10-CM | POA: Diagnosis present

## 2020-09-02 DIAGNOSIS — R6520 Severe sepsis without septic shock: Secondary | ICD-10-CM | POA: Diagnosis not present

## 2020-09-02 DIAGNOSIS — F32A Depression, unspecified: Secondary | ICD-10-CM | POA: Diagnosis not present

## 2020-09-02 DIAGNOSIS — G43711 Chronic migraine without aura, intractable, with status migrainosus: Secondary | ICD-10-CM | POA: Diagnosis present

## 2020-09-02 DIAGNOSIS — J690 Pneumonitis due to inhalation of food and vomit: Secondary | ICD-10-CM | POA: Diagnosis present

## 2020-09-02 DIAGNOSIS — Z9151 Personal history of suicidal behavior: Secondary | ICD-10-CM

## 2020-09-02 DIAGNOSIS — R0902 Hypoxemia: Secondary | ICD-10-CM | POA: Diagnosis present

## 2020-09-02 DIAGNOSIS — N179 Acute kidney failure, unspecified: Secondary | ICD-10-CM | POA: Diagnosis present

## 2020-09-02 DIAGNOSIS — R32 Unspecified urinary incontinence: Secondary | ICD-10-CM | POA: Diagnosis present

## 2020-09-02 DIAGNOSIS — I5021 Acute systolic (congestive) heart failure: Secondary | ICD-10-CM | POA: Diagnosis present

## 2020-09-02 DIAGNOSIS — D649 Anemia, unspecified: Secondary | ICD-10-CM | POA: Diagnosis present

## 2020-09-02 DIAGNOSIS — I6340 Cerebral infarction due to embolism of unspecified cerebral artery: Secondary | ICD-10-CM | POA: Insufficient documentation

## 2020-09-02 DIAGNOSIS — I361 Nonrheumatic tricuspid (valve) insufficiency: Secondary | ICD-10-CM | POA: Diagnosis not present

## 2020-09-02 DIAGNOSIS — R6521 Severe sepsis with septic shock: Secondary | ICD-10-CM

## 2020-09-02 DIAGNOSIS — Z6834 Body mass index (BMI) 34.0-34.9, adult: Secondary | ICD-10-CM

## 2020-09-02 DIAGNOSIS — R7989 Other specified abnormal findings of blood chemistry: Secondary | ICD-10-CM | POA: Diagnosis present

## 2020-09-02 DIAGNOSIS — I248 Other forms of acute ischemic heart disease: Secondary | ICD-10-CM | POA: Diagnosis not present

## 2020-09-02 DIAGNOSIS — K580 Irritable bowel syndrome with diarrhea: Secondary | ICD-10-CM | POA: Diagnosis present

## 2020-09-02 DIAGNOSIS — R571 Hypovolemic shock: Secondary | ICD-10-CM | POA: Diagnosis present

## 2020-09-02 DIAGNOSIS — I429 Cardiomyopathy, unspecified: Secondary | ICD-10-CM | POA: Diagnosis present

## 2020-09-02 DIAGNOSIS — F17210 Nicotine dependence, cigarettes, uncomplicated: Secondary | ICD-10-CM | POA: Diagnosis present

## 2020-09-02 DIAGNOSIS — E669 Obesity, unspecified: Secondary | ICD-10-CM | POA: Diagnosis present

## 2020-09-02 DIAGNOSIS — A419 Sepsis, unspecified organism: Principal | ICD-10-CM | POA: Diagnosis present

## 2020-09-02 DIAGNOSIS — I313 Pericardial effusion (noninflammatory): Secondary | ICD-10-CM | POA: Diagnosis present

## 2020-09-02 DIAGNOSIS — Z8249 Family history of ischemic heart disease and other diseases of the circulatory system: Secondary | ICD-10-CM

## 2020-09-02 DIAGNOSIS — Z20822 Contact with and (suspected) exposure to covid-19: Secondary | ICD-10-CM | POA: Diagnosis present

## 2020-09-02 DIAGNOSIS — R760 Raised antibody titer: Secondary | ICD-10-CM | POA: Diagnosis present

## 2020-09-02 DIAGNOSIS — R7401 Elevation of levels of liver transaminase levels: Secondary | ICD-10-CM | POA: Diagnosis present

## 2020-09-02 DIAGNOSIS — I63412 Cerebral infarction due to embolism of left middle cerebral artery: Secondary | ICD-10-CM | POA: Diagnosis present

## 2020-09-02 DIAGNOSIS — I639 Cerebral infarction, unspecified: Secondary | ICD-10-CM | POA: Diagnosis not present

## 2020-09-02 DIAGNOSIS — Z8659 Personal history of other mental and behavioral disorders: Secondary | ICD-10-CM

## 2020-09-02 DIAGNOSIS — T40601A Poisoning by unspecified narcotics, accidental (unintentional), initial encounter: Secondary | ICD-10-CM | POA: Diagnosis present

## 2020-09-02 DIAGNOSIS — Z880 Allergy status to penicillin: Secondary | ICD-10-CM

## 2020-09-02 DIAGNOSIS — Z79899 Other long term (current) drug therapy: Secondary | ICD-10-CM

## 2020-09-02 HISTORY — DX: Cerebral infarction, unspecified: I63.9

## 2020-09-02 LAB — CBC WITH DIFFERENTIAL/PLATELET
Abs Immature Granulocytes: 0.15 10*3/uL — ABNORMAL HIGH (ref 0.00–0.07)
Basophils Absolute: 0 10*3/uL (ref 0.0–0.1)
Basophils Relative: 0 %
Eosinophils Absolute: 0 10*3/uL (ref 0.0–0.5)
Eosinophils Relative: 0 %
HCT: 35.2 % — ABNORMAL LOW (ref 36.0–46.0)
Hemoglobin: 10.9 g/dL — ABNORMAL LOW (ref 12.0–15.0)
Immature Granulocytes: 1 %
Lymphocytes Relative: 5 %
Lymphs Abs: 1.1 10*3/uL (ref 0.7–4.0)
MCH: 28.7 pg (ref 26.0–34.0)
MCHC: 31 g/dL (ref 30.0–36.0)
MCV: 92.6 fL (ref 80.0–100.0)
Monocytes Absolute: 1.5 10*3/uL — ABNORMAL HIGH (ref 0.1–1.0)
Monocytes Relative: 6 %
Neutro Abs: 21.5 10*3/uL — ABNORMAL HIGH (ref 1.7–7.7)
Neutrophils Relative %: 88 %
Platelets: 220 10*3/uL (ref 150–400)
RBC: 3.8 MIL/uL — ABNORMAL LOW (ref 3.87–5.11)
RDW: 14.7 % (ref 11.5–15.5)
WBC: 24.3 10*3/uL — ABNORMAL HIGH (ref 4.0–10.5)
nRBC: 0 % (ref 0.0–0.2)

## 2020-09-02 LAB — PROCALCITONIN: Procalcitonin: 17.61 ng/mL

## 2020-09-02 LAB — URINALYSIS, ROUTINE W REFLEX MICROSCOPIC
Bilirubin Urine: NEGATIVE
Glucose, UA: 150 mg/dL — AB
Hgb urine dipstick: NEGATIVE
Ketones, ur: NEGATIVE mg/dL
Leukocytes,Ua: NEGATIVE
Nitrite: NEGATIVE
Protein, ur: NEGATIVE mg/dL
Specific Gravity, Urine: 1.013 (ref 1.005–1.030)
pH: 5 (ref 5.0–8.0)

## 2020-09-02 LAB — RAPID URINE DRUG SCREEN, HOSP PERFORMED
Amphetamines: NOT DETECTED
Barbiturates: NOT DETECTED
Benzodiazepines: NOT DETECTED
Cocaine: NOT DETECTED
Opiates: POSITIVE — AB
Tetrahydrocannabinol: NOT DETECTED

## 2020-09-02 LAB — RESPIRATORY PANEL BY RT PCR (FLU A&B, COVID)
Influenza A by PCR: NEGATIVE
Influenza B by PCR: NEGATIVE
SARS Coronavirus 2 by RT PCR: NEGATIVE

## 2020-09-02 LAB — COMPREHENSIVE METABOLIC PANEL
ALT: 54 U/L — ABNORMAL HIGH (ref 0–44)
ALT: 62 U/L — ABNORMAL HIGH (ref 0–44)
ALT: 66 U/L — ABNORMAL HIGH (ref 0–44)
AST: 86 U/L — ABNORMAL HIGH (ref 15–41)
AST: 90 U/L — ABNORMAL HIGH (ref 15–41)
AST: 93 U/L — ABNORMAL HIGH (ref 15–41)
Albumin: 3.6 g/dL (ref 3.5–5.0)
Albumin: 3.1 g/dL — ABNORMAL LOW (ref 3.5–5.0)
Albumin: 3.1 g/dL — ABNORMAL LOW (ref 3.5–5.0)
Alkaline Phosphatase: 64 U/L (ref 38–126)
Alkaline Phosphatase: 51 U/L (ref 38–126)
Alkaline Phosphatase: 48 U/L (ref 38–126)
Anion gap: 17 — ABNORMAL HIGH (ref 5–15)
Anion gap: 9 (ref 5–15)
Anion gap: 9 (ref 5–15)
BUN: 18 mg/dL (ref 6–20)
BUN: 17 mg/dL (ref 6–20)
BUN: 15 mg/dL (ref 6–20)
CO2: 20 mmol/L — ABNORMAL LOW (ref 22–32)
CO2: 22 mmol/L (ref 22–32)
CO2: 22 mmol/L (ref 22–32)
Calcium: 8.4 mg/dL — ABNORMAL LOW (ref 8.9–10.3)
Calcium: 8.1 mg/dL — ABNORMAL LOW (ref 8.9–10.3)
Calcium: 8.2 mg/dL — ABNORMAL LOW (ref 8.9–10.3)
Chloride: 101 mmol/L (ref 98–111)
Chloride: 103 mmol/L (ref 98–111)
Chloride: 104 mmol/L (ref 98–111)
Creatinine, Ser: 1.96 mg/dL — ABNORMAL HIGH (ref 0.44–1.00)
Creatinine, Ser: 1.36 mg/dL — ABNORMAL HIGH (ref 0.44–1.00)
Creatinine, Ser: 1.34 mg/dL — ABNORMAL HIGH (ref 0.44–1.00)
GFR, Estimated: 34 mL/min — ABNORMAL LOW (ref >60)
GFR, Estimated: 53 mL/min — ABNORMAL LOW (ref >60)
GFR, Estimated: 54 mL/min — ABNORMAL LOW (ref >60)
Glucose, Bld: 198 mg/dL — ABNORMAL HIGH (ref 70–99)
Glucose, Bld: 139 mg/dL — ABNORMAL HIGH (ref 70–99)
Glucose, Bld: 123 mg/dL — ABNORMAL HIGH (ref 70–99)
Potassium: 5.9 mmol/L — ABNORMAL HIGH (ref 3.5–5.1)
Potassium: 5.2 mmol/L — ABNORMAL HIGH (ref 3.5–5.1)
Potassium: 4.5 mmol/L (ref 3.5–5.1)
Sodium: 138 mmol/L (ref 135–145)
Sodium: 134 mmol/L — ABNORMAL LOW (ref 135–145)
Sodium: 135 mmol/L (ref 135–145)
Total Bilirubin: 0.4 mg/dL (ref 0.3–1.2)
Total Bilirubin: 0.3 mg/dL (ref 0.3–1.2)
Total Bilirubin: 0.5 mg/dL (ref 0.3–1.2)
Total Protein: 6.6 g/dL (ref 6.5–8.1)
Total Protein: 5.4 g/dL — ABNORMAL LOW (ref 6.5–8.1)
Total Protein: 6.1 g/dL — ABNORMAL LOW (ref 6.5–8.1)

## 2020-09-02 LAB — BLOOD GAS, ARTERIAL
Acid-base deficit: 0.3 mmol/L (ref 0.0–2.0)
Bicarbonate: 24.2 mmol/L (ref 20.0–28.0)
FIO2: 32
O2 Saturation: 93.8 %
Patient temperature: 37
pCO2 arterial: 42.3 mmHg (ref 32.0–48.0)
pH, Arterial: 7.376 (ref 7.350–7.450)
pO2, Arterial: 71.4 mmHg — ABNORMAL LOW (ref 83.0–108.0)

## 2020-09-02 LAB — VITAMIN B12: Vitamin B-12: 839 pg/mL (ref 180–914)

## 2020-09-02 LAB — PROTIME-INR
INR: 1.2 (ref 0.8–1.2)
Prothrombin Time: 14.4 seconds (ref 11.4–15.2)

## 2020-09-02 LAB — TROPONIN I (HIGH SENSITIVITY)
Troponin I (High Sensitivity): 205 ng/L (ref <18)
Troponin I (High Sensitivity): 223 ng/L (ref <18)

## 2020-09-02 LAB — ACETAMINOPHEN LEVEL: Acetaminophen (Tylenol), Serum: <10 ug/mL — ABNORMAL LOW (ref 10–30)

## 2020-09-02 LAB — ECHOCARDIOGRAM COMPLETE
Area-P 1/2: 4.31 cm2
Calc EF: 42 %
MV M vel: 4.16 m/s
MV Peak grad: 69.2 mmHg
Radius: 0.5 cm
S' Lateral: 4.15 cm
Single Plane A2C EF: 37.8 %
Single Plane A4C EF: 45.4 %

## 2020-09-02 LAB — ETHANOL: Alcohol, Ethyl (B): <10 mg/dL (ref <10)

## 2020-09-02 LAB — LACTIC ACID, PLASMA
Lactic Acid, Venous: 2.9 mmol/L (ref 0.5–1.9)
Lactic Acid, Venous: 3.2 mmol/L (ref 0.5–1.9)
Lactic Acid, Venous: 2.6 mmol/L (ref 0.5–1.9)
Lactic Acid, Venous: 2.7 mmol/L (ref 0.5–1.9)
Lactic Acid, Venous: 2.4 mmol/L (ref 0.5–1.9)

## 2020-09-02 LAB — SALICYLATE LEVEL: Salicylate Lvl: <7 mg/dL — ABNORMAL LOW (ref 7.0–30.0)

## 2020-09-02 LAB — CK: Total CK: 427 U/L — ABNORMAL HIGH (ref 38–234)

## 2020-09-02 LAB — APTT: aPTT: 27 seconds (ref 24–36)

## 2020-09-02 LAB — I-STAT BETA HCG BLOOD, ED (MC, WL, AP ONLY): I-stat hCG, quantitative: <5 m[IU]/mL (ref <5)

## 2020-09-02 LAB — HIV ANTIBODY (ROUTINE TESTING W REFLEX): HIV Screen 4th Generation wRfx: NONREACTIVE

## 2020-09-02 MED ORDER — PRAZOSIN HCL 1 MG PO CAPS
1.0000 mg | ORAL_CAPSULE | Freq: Two times a day (BID) | ORAL | Status: DC
  Administered 2020-09-03 – 2020-09-07 (×8): 1 mg via ORAL
  Filled 2020-09-02 (×10): qty 1

## 2020-09-02 MED ORDER — QUETIAPINE FUMARATE ER 50 MG PO TB24
50.0000 mg | ORAL_TABLET | Freq: Every day | ORAL | Status: DC
  Administered 2020-09-03 – 2020-09-06 (×4): 50 mg via ORAL
  Filled 2020-09-02 (×5): qty 1

## 2020-09-02 MED ORDER — LACTATED RINGERS IV SOLN: INTRAVENOUS | Status: DC

## 2020-09-02 MED ORDER — LEVOFLOXACIN IN D5W 750 MG/150ML IV SOLN: 750.0000 mg | INTRAVENOUS | Status: DC

## 2020-09-02 MED ORDER — IOHEXOL 350 MG/ML SOLN
100.0000 mL | Freq: Once | INTRAVENOUS | Status: AC | PRN
  Administered 2020-09-02: 78 mL via INTRAVENOUS

## 2020-09-02 MED ORDER — LEVOFLOXACIN IN D5W 750 MG/150ML IV SOLN
750.0000 mg | Freq: Once | INTRAVENOUS | Status: AC
  Administered 2020-09-02: 750 mg via INTRAVENOUS
  Filled 2020-09-02: qty 150

## 2020-09-02 MED ORDER — LINACLOTIDE 145 MCG PO CAPS: 145.0000 ug | ORAL_CAPSULE | ORAL | Status: DC

## 2020-09-02 MED ORDER — LACTATED RINGERS IV BOLUS (SEPSIS)
1000.0000 mL | Freq: Once | INTRAVENOUS | Status: AC
  Administered 2020-09-02: 1000 mL via INTRAVENOUS

## 2020-09-02 MED ORDER — ENOXAPARIN SODIUM 40 MG/0.4ML ~~LOC~~ SOLN
40.0000 mg | SUBCUTANEOUS | Status: DC
  Administered 2020-09-02 – 2020-09-06 (×5): 40 mg via SUBCUTANEOUS
  Filled 2020-09-02 (×5): qty 0.4

## 2020-09-02 MED ORDER — GADOBUTROL 1 MMOL/ML IV SOLN
8.5000 mL | Freq: Once | INTRAVENOUS | Status: AC | PRN
  Administered 2020-09-02: 8.5 mL via INTRAVENOUS

## 2020-09-02 MED ORDER — SODIUM CHLORIDE 0.9 % IV BOLUS
1000.0000 mL | Freq: Once | INTRAVENOUS | Status: AC
  Administered 2020-09-02: 1000 mL via INTRAVENOUS

## 2020-09-02 MED ORDER — LACTATED RINGERS IV BOLUS (SEPSIS): 1000.0000 mL | Freq: Once | INTRAVENOUS | Status: DC

## 2020-09-02 MED ORDER — DULOXETINE HCL 60 MG PO CPEP
60.0000 mg | ORAL_CAPSULE | Freq: Every day | ORAL | Status: DC
  Administered 2020-09-03 – 2020-09-07 (×5): 60 mg via ORAL
  Filled 2020-09-02 (×5): qty 1

## 2020-09-02 MED ORDER — LEVOFLOXACIN IN D5W 750 MG/150ML IV SOLN
750.0000 mg | INTRAVENOUS | Status: DC
  Administered 2020-09-03 – 2020-09-04 (×3): 750 mg via INTRAVENOUS
  Filled 2020-09-02 (×3): qty 150

## 2020-09-02 NOTE — Consult Note (Signed)
Referring Physician: Dr. Pollie Meyer    Chief Complaint: AMS  HPI: Tabitha Smith is an 29 y.o. female with a history of migraine headaches, recently started on Aimovig (a calcitonin-gene-related peptide receptor antagonist which acts as a potent vasodilator on intracranial arteries), who presented to the [facility] ED on Thursday morning with AMS. The family found the patient unresponsive at home and called EMS. Her sister found the patient in bed, incontinent of urine, difficult to arouse, with generalized muscle rigidity and tongue bite. On EMS, arrival the patient was having decreased respiratory effort and was unresponsive.  She received 1 mg of Narcan intranasally and was temporarily bagged to help with her breathing.  She then became more responsive with increased respiratory effort.  She received 400 mg of IV fluid and was brought to the ED.  EMS did not notice any pill bottles or any obvious signs of injuries on initial exam. Her CBG was in the 200s.  On arrival to the ED, she was altered    EDP exam revealed multiple linear scars to her right forearm concerning for self injurious behavior. She became tachycardic and hypotensive in the ED initially. After about 50 minutes in the ED, she was more coherent and alert; at that time she denied any attempts to harm herself and did not complaint of any pain. She denied taking any pain meds at home; however, UDS was positive for opiates. Her CXR showed left lung infiltrate c/w PNA. Troponin was elevated concerning for demand ischemia. She had an elevated Cr of 1.96 and mild transaminitis. Levaquin was started for PNA.   At 1:24 PM, she continued to show improvement in her mental status. At that time she described chest pressure. Cardiology was called and on discussion, demand ischemia continued to be the most likely component of the DDx for her CP.   Cardiology then saw the patient and documented a DDx of hypovolemic vs cardiogenic shock, with the former being  significantly more likely given concomitant AKI. Her TTE revealed new onset LVEF of 25-30% with global hypokinesis, grade I diastolic dysfunction and moderate MV/TV regurgitation.   Of note, she had a root canal done on Wednesday and was able to go to work afterward.   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    Past Medical History:  Diagnosis Date   Migraine    Sinusitis     Past Surgical History:  Procedure Laterality Date   NO PAST SURGERIES     stitches to abdomen      for a cut     Family History  Problem Relation Age of Onset   Diabetes Mother    Seizures Mother    Cancer Mother        Colon   Hypertension Mother    Migraines Neg Hx    Headache Neg Hx    Social History:  reports that she has been smoking cigarettes. She has been smoking about 0.10 packs per day. She has never used smokeless tobacco. She reports current alcohol use. She reports that she does not use drugs.  Allergies:  Allergies  Allergen Reactions   Amoxicillin Anaphylaxis   Penicillins Anaphylaxis    Has patient had a PCN reaction causing immediate rash, facial/tongue/throat swelling, SOB or lightheadedness with hypotension: Yes Has patient had a PCN reaction causing severe rash involving mucus membranes or skin necrosis: Unk Has patient had a PCN reaction that required hospitalization: No Has patient had a  PCN reaction occurring within the last 10 years: No If all of the above answers are "NO", then may proceed with Cephalosporin use.     Medications:  Prior to Admission:  Medications Prior to Admission  Medication Sig Dispense Refill Last Dose   DULoxetine (CYMBALTA) 60 MG capsule Take 60 mg by mouth daily.   09/01/2020 at Unknown time   Erenumab-aooe (AIMOVIG) 70 MG/ML SOAJ Inject 70 mg into the skin every 14 (fourteen) days. 1.12 mL 6 08/28/2020   linaclotide (LINZESS) 145 MCG CAPS capsule Take 145 mcg by mouth  every other day.    08/31/2020   prazosin (MINIPRESS) 1 MG capsule Take 1 mg by mouth 2 (two) times daily.   09/01/2020 at Unknown time   QUEtiapine (SEROQUEL XR) 50 MG TB24 24 hr tablet Take 50 mg by mouth at bedtime.   09/01/2020 at Unknown time   Vitamin D, Ergocalciferol, (DRISDOL) 1.25 MG (50000 UNIT) CAPS capsule Take 50,000 Units by mouth once a week. Saturday   08/28/2020   zolpidem (AMBIEN) 10 MG tablet Take 10 mg by mouth at bedtime.   09/01/2020 at Unknown time   Rimegepant Sulfate (NURTEC) 75 MG TBDP Take 75 mg by mouth daily as needed. For migraines. Take as close to onset of migraine as possible. One daily maximum. 10 tablet 6    Scheduled:  DULoxetine  60 mg Oral Daily   enoxaparin (LOVENOX) injection  40 mg Subcutaneous Q24H   prazosin  1 mg Oral BID   QUEtiapine  50 mg Oral QHS   Continuous:  levofloxacin (LEVAQUIN) IV      ROS: As per HPI. Comprehensive ROS otherwise negative  Physical Examination: Blood pressure 98/71, pulse 81, temperature 98.5 F (36.9 C), temperature source Oral, resp. rate 18, height 5\' 6"  (1.676 m), weight 90.1 kg, SpO2 100 %.  HEENT: [place]/AT Lungs: Respirations unlabored Ext: No edema  Neurologic Examination: Mental Status: Awakes readily to voice. Alert, oriented x 5, thought content appropriate.  Speech fluent with intact comprehension, naming and repetition. . Cranial Nerves: II:  Temporal visual fields intact bilaterally, with no extinction to DSS. PERRL.  III,IV, VI: No ptosis. EOMI with saccadic quality of pursuits noted.  V,VII: Smile symmetric, facial temp sensation equal bilaterally VIII: hearing intact to voice IX,X: No hypophonia XI: Symmetric XII: Midline tongue extension  Motor: Right : Upper extremity   5/5    Left:     Upper extremity   5/5  Lower extremity   5/5     Lower extremity   5/5 Normal tone throughout; no atrophy noted Sensory: Temp and light touch intact throughout, bilaterally Deep Tendon Reflexes:  2+  bilateral brachioradialis, patellae and achilles Plantars: Right: downgoing   Left: downgoing Cerebellar: No ataxia with FNF bilaterally  Gait: Deferred  Results for orders placed or performed during the hospital encounter of 09/02/20 (from the past 48 hour(s))  Comprehensive metabolic panel     Status: Abnormal   Collection Time: 09/02/20 10:26 AM  Result Value Ref Range   Sodium 138 135 - 145 mmol/L   Potassium 5.9 (H) 3.5 - 5.1 mmol/L   Chloride 101 98 - 111 mmol/L   CO2 20 (L) 22 - 32 mmol/L   Glucose, Bld 198 (H) 70 - 99 mg/dL    Comment: Glucose reference range applies only to samples taken after fasting for at least 8 hours.   BUN 18 6 - 20 mg/dL   Creatinine, Ser 13/04/21 (H) 0.44 - 1.00 mg/dL  Calcium 8.4 (L) 8.9 - 10.3 mg/dL   Total Protein 6.6 6.5 - 8.1 g/dL   Albumin 3.6 3.5 - 5.0 g/dL   AST 86 (H) 15 - 41 U/L   ALT 54 (H) 0 - 44 U/L   Alkaline Phosphatase 64 38 - 126 U/L   Total Bilirubin 0.4 0.3 - 1.2 mg/dL   GFR, Estimated 34 (L) >60 mL/min    Comment: (NOTE) Calculated using the CKD-EPI Creatinine Equation (2021)    Anion gap 17 (H) 5 - 15    Comment: Performed at [facility], [address], [place], [place] [address]  Troponin I (High Sensitivity)     Status: Abnormal   Collection Time: 09/02/20 10:26 AM  Result Value Ref Range   Troponin I (High Sensitivity) 205 (HH) <18 ng/L    Comment: CRITICAL RESULT CALLED TO, READ BACK BY AND VERIFIED WITH: TRAN,B RN @1139  ON [id number] BY FLEMINGS (NOTE) Elevated high sensitivity troponin I (hsTnI) values and significant  changes across serial measurements may suggest ACS but many other  chronic and acute conditions are known to elevate hsTnI results.  Refer to the Links section for chest pain algorithms and additional  guidance. Performed at [facility], [address], [place], [place] [address]   Respiratory Panel by RT PCR (Flu A&B, Covid) - Urine, Clean Catch     Status: None   Collection Time:  09/02/20 10:33 AM   Specimen: Urine, Clean Catch; Nasopharyngeal  Result Value Ref Range   SARS Coronavirus 2 by RT PCR NEGATIVE NEGATIVE    Comment: (NOTE) SARS-CoV-2 target nucleic acids are NOT DETECTED.  The SARS-CoV-2 RNA is generally detectable in upper respiratoy specimens during the acute phase of infection. The lowest concentration of SARS-CoV-2 viral copies this assay can detect is 131 copies/mL. A negative result does not preclude SARS-Cov-2 infection and should not be used as the sole basis for treatment or other patient management decisions. A negative result may occur with  improper specimen collection/handling, submission of specimen other than nasopharyngeal swab, presence of viral mutation(s) within the areas targeted by this assay, and inadequate number of viral copies (<131 copies/mL). A negative result must be combined with clinical observations, patient history, and epidemiological information. The expected result is Negative.  Fact Sheet for Patients:  https://www.moore.com/  Fact Sheet for Healthcare Providers:  https://www.young.biz/  This test is no t yet approved or cleared by the [place] FDA and  has been authorized for detection and/or diagnosis of SARS-CoV-2 by FDA under an Emergency Use Authorization (EUA). This EUA will remain  in effect (meaning this test can be used) for the duration of the COVID-19 declaration under Section 564(b)(1) of the Act, 21 U.S.C. section 360bbb-3(b)(1), unless the authorization is terminated or revoked sooner.     Influenza A by PCR NEGATIVE NEGATIVE   Influenza B by PCR NEGATIVE NEGATIVE    Comment: (NOTE) The Xpert Xpress SARS-CoV-2/FLU/RSV assay is intended as an aid in  the diagnosis of influenza from Nasopharyngeal swab specimens and  should not be used as a sole basis for treatment. Nasal washings and  aspirates are unacceptable for Xpert Xpress SARS-CoV-2/FLU/RSV   testing.  Fact Sheet for Patients: https://www.moore.com/  Fact Sheet for Healthcare Providers: https://www.young.biz/  This test is not yet approved or cleared by the [place] FDA and  has been authorized for detection and/or diagnosis of SARS-CoV-2 by  FDA under an Emergency Use Authorization (EUA). This EUA will remain  in effect (meaning this test can be used) for the duration of the  Covid-19 declaration under Section 564(b)(1) of the Act, 21  U.S.C. section 360bbb-3(b)(1), unless the authorization is  terminated or revoked. Performed at [facility], [address], [place], [place] [address]   Urine rapid drug screen (hosp performed)     Status: Abnormal   Collection Time: 09/02/20 10:34 AM  Result Value Ref Range   Opiates POSITIVE (A) NONE DETECTED   Cocaine NONE DETECTED NONE DETECTED   Benzodiazepines NONE DETECTED NONE DETECTED   Amphetamines NONE DETECTED NONE DETECTED   Tetrahydrocannabinol NONE DETECTED NONE DETECTED   Barbiturates NONE DETECTED NONE DETECTED    Comment: (NOTE) DRUG SCREEN FOR MEDICAL PURPOSES ONLY.  IF CONFIRMATION IS NEEDED FOR ANY PURPOSE, NOTIFY LAB WITHIN 5 DAYS.  LOWEST DETECTABLE LIMITS FOR URINE DRUG SCREEN Drug Class                     Cutoff (ng/mL) Amphetamine and metabolites    1000 Barbiturate and metabolites    200 Benzodiazepine                 200 Tricyclics and metabolites     300 Opiates and metabolites        300 Cocaine and metabolites        300 THC                            50 Performed at [facility], [address], [place], [place] [address]   Urinalysis, Routine w reflex microscopic Urine, Clean Catch     Status: Abnormal   Collection Time: 09/02/20 10:34 AM  Result Value Ref Range   Color, Urine YELLOW YELLOW   APPearance HAZY (A) CLEAR   Specific Gravity, Urine 1.013 1.005 - 1.030   pH 5.0 5.0 - 8.0   Glucose, UA 150 (A) NEGATIVE mg/dL   Hgb  urine dipstick NEGATIVE NEGATIVE   Bilirubin Urine NEGATIVE NEGATIVE   Ketones, ur NEGATIVE NEGATIVE mg/dL   Protein, ur NEGATIVE NEGATIVE mg/dL   Nitrite NEGATIVE NEGATIVE   Leukocytes,Ua NEGATIVE NEGATIVE    Comment: Performed at [facility], [address] [place], [place] [address]  Acetaminophen level     Status: Abnormal   Collection Time: 09/02/20 10:40 AM  Result Value Ref Range   Acetaminophen (Tylenol), Serum <10 (L) 10 - 30 ug/mL    Comment: (NOTE) Therapeutic concentrations vary significantly. A range of 10-30 ug/mL  may be an effective concentration for many patients. However, some  are best treated at concentrations outside of this range. Acetaminophen concentrations >150 ug/mL at 4 hours after ingestion  and >50 ug/mL at 12 hours after ingestion are often associated with  toxic reactions.  Performed at [facility], [address] [place], [place] [address]   Ethanol     Status: None   Collection Time: 09/02/20 10:40 AM  Result Value Ref Range   Alcohol, Ethyl (B) <10 <10 mg/dL    Comment: (NOTE) Lowest detectable limit for serum alcohol is 10 mg/dL.  For medical purposes only. Performed at [facility], [address] [place], [place] [address]   Salicylate level     Status: Abnormal   Collection Time: 09/02/20 10:40 AM  Result Value Ref Range   Salicylate Lvl <7.0 (L) 7.0 - 30.0 mg/dL    Comment: Performed at  [facility], [address] [place] [place], [place] [address]  I-Stat beta hCG blood, ED     Status: None   Collection Time: 09/02/20 11:45 AM  Result Value Ref Range   I-stat hCG, quantitative <5.0 <5 mIU/mL   Comment 3            Comment:   GEST. AGE      CONC.  (mIU/mL)   <=1 WEEK        5 - 50     2 WEEKS       50 - 500     3 WEEKS       100 - 10,000     4 WEEKS     1,000 - 30,000        FEMALE AND NON-PREGNANT FEMALE:     LESS THAN 5 mIU/mL   Lactic acid, plasma     Status: Abnormal   Collection Time: 09/02/20 12:20  PM  Result Value Ref Range   Lactic Acid, Venous 2.9 (HH) 0.5 - 1.9 mmol/L    Comment: CRITICAL RESULT CALLED TO, READ BACK BY AND VERIFIED WITH: TRAN,O RN @1316  ON [id number] BY FLEMINGS Performed at [facility], [address], [place], [place] [address]   CBC WITH DIFFERENTIAL     Status: Abnormal   Collection Time: 09/02/20 12:20 PM  Result Value Ref Range   WBC 24.3 (H) 4.0 - 10.5 K/uL   RBC 3.80 (L) 3.87 - 5.11 MIL/uL   Hemoglobin 10.9 (L) 12.0 - 15.0 g/dL   HCT 08.6 (L) 36 - 46 %   MCV 92.6 80.0 - 100.0 fL   MCH 28.7 26.0 - 34.0 pg   MCHC 31.0 30.0 - 36.0 g/dL   RDW 57.8 46.9 - 62.9 %   Platelets 220 150 - 400 K/uL   nRBC 0.0 0.0 - 0.2 %   Neutrophils Relative % 88 %   Neutro Abs 21.5 (H) 1.7 - 7.7 K/uL   Lymphocytes Relative 5 %   Lymphs Abs 1.1 0.7 - 4.0 K/uL   Monocytes Relative 6 %   Monocytes Absolute 1.5 (H) 0.1 - 1.0 K/uL   Eosinophils Relative 0 %   Eosinophils Absolute 0.0 0.0 - 0.5 K/uL   Basophils Relative 0 %   Basophils Absolute 0.0 0.0 - 0.1 K/uL   Immature Granulocytes 1 %   Abs Immature Granulocytes 0.15 (H) 0.00 - 0.07 K/uL    Comment: Performed at [facility], [address], [place], [place] [address]  Protime-INR     Status: None   Collection Time: 09/02/20 12:20 PM  Result Value Ref Range   Prothrombin Time 14.4 11.4 - 15.2 seconds   INR 1.2 0.8 - 1.2    Comment: (NOTE) INR goal varies based on device and disease states. Performed at [facility], [address] [place], [place] [address]   APTT     Status: None   Collection Time: 09/02/20 12:20 PM  Result Value Ref Range   aPTT 27 24 - 36 seconds    Comment: Performed at [facility], [address] [place], [place] [address]  Lactic acid, plasma     Status: Abnormal   Collection Time: 09/02/20  3:57 PM  Result Value Ref Range   Lactic Acid, Venous 3.2 (HH) 0.5 - 1.9 mmol/L    Comment: CRITICAL VALUE NOTED.  VALUE IS CONSISTENT WITH PREVIOUSLY REPORTED AND  CALLED VALUE. Performed at [facility], [address] [place], [place] [address]   Troponin I (High Sensitivity)     Status: Abnormal   Collection Time: 09/02/20  3:57 PM  Result Value Ref Range   Troponin I (High Sensitivity) 223 (HH) <18 ng/L    Comment: CRITICAL VALUE NOTED.  VALUE IS CONSISTENT WITH PREVIOUSLY REPORTED AND CALLED VALUE. (NOTE) Elevated high sensitivity troponin I (hsTnI) values and significant  changes across serial measurements may suggest ACS but many other  chronic and acute conditions are known to elevate hsTnI results.  Refer to the Links section for chest pain algorithms and additional  guidance. Performed at [facility], [address] [place], [place] [address]   CK     Status: Abnormal   Collection Time: 09/02/20  3:57 PM  Result Value Ref Range   Total CK 427 (H) 38.0 - 234.0 U/L    Comment: Performed at [facility], [address] [place], [place] [address]  Comprehensive metabolic panel     Status: Abnormal   Collection Time: 09/02/20  3:57 PM  Result Value Ref Range   Sodium 134 (L) 135 - 145 mmol/L   Potassium 5.2 (H) 3.5 - 5.1 mmol/L   Chloride 103 98 - 111 mmol/L   CO2 22 22 - 32 mmol/L   Glucose, Bld 139 (H) 70 - 99 mg/dL    Comment: Glucose reference range applies only to samples taken after fasting for at least 8 hours.   BUN 17 6 - 20 mg/dL   Creatinine, Ser 8.65 (H) 0.44 - 1.00 mg/dL   Calcium 8.1 (L) 8.9 - 10.3 mg/dL   Total Protein 5.4 (L) 6.5 - 8.1 g/dL   Albumin 3.1 (L) 3.5 - 5.0 g/dL   AST 90 (H) 15 - 41 U/L   ALT 62 (H) 0 - 44 U/L   Alkaline Phosphatase 51 38 - 126 U/L   Total Bilirubin 0.3 0.3 - 1.2 mg/dL   GFR, Estimated 53 (L) >60 mL/min    Comment: (NOTE) Calculated using the CKD-EPI Creatinine Equation (2021)    Anion gap 9 5 - 15    Comment: Performed at [facility], [address], [place], [place] [address]  HIV Antibody (routine testing w rflx)     Status: None   Collection Time:  09/02/20  6:35 PM  Result Value Ref Range   HIV Screen 4th Generation wRfx Non Reactive Non Reactive    Comment: Performed at [facility], [address] [place] [address], [place], [place] [address]  Vitamin B12     Status: None   Collection Time: 09/02/20  6:35 PM  Result Value Ref Range   Vitamin B-12 839 180 - 914 pg/mL    Comment: (NOTE) This assay is not validated for testing neonatal or myeloproliferative syndrome specimens for Vitamin B12 levels. Performed at [facility], [address] [place], [place] [address]   Procalcitonin - Baseline     Status: None   Collection Time: 09/02/20  6:35 PM  Result Value Ref Range   Procalcitonin 17.61 ng/mL    Comment:        Interpretation: PCT >= 10 ng/mL: Important systemic inflammatory response, almost exclusively due to severe bacterial sepsis or septic shock. (NOTE)       Sepsis PCT Algorithm           Lower Respiratory Tract  Infection PCT Algorithm    ----------------------------     ----------------------------         PCT < 0.25 ng/mL                PCT < 0.10 ng/mL          Strongly encourage             Strongly discourage   discontinuation of antibiotics    initiation of antibiotics    ----------------------------     -----------------------------       PCT 0.25 - 0.50 ng/mL            PCT 0.10 - 0.25 ng/mL               OR       >80% decrease in PCT            Discourage initiation of                                            antibiotics      Encourage discontinuation           of antibiotics    ----------------------------     -----------------------------         PCT >= 0.50 ng/mL              PCT 0.26 - 0.50 ng/mL                AND       <80% decrease in PCT             Encourage initiation of                                             antibiotics       Encourage continuation           of antibiotics    ----------------------------     -----------------------------         PCT >= 0.50 ng/mL                  PCT > 0.50 ng/mL               AND         increase in PCT                  Strongly encourage                                      initiation of antibiotics    Strongly encourage escalation           of antibiotics                                     -----------------------------                                           PCT <= 0.25 ng/mL  OR                                        > 80% decrease in PCT                                      Discontinue / Do not initiate                                             antibiotics  Performed at [facility], [address], [place], [place] [address]   Lactic acid, plasma     Status: Abnormal   Collection Time: 09/02/20  6:35 PM  Result Value Ref Range   Lactic Acid, Venous 2.6 (HH) 0.5 - 1.9 mmol/L    Comment: CRITICAL VALUE NOTED.  VALUE IS CONSISTENT WITH PREVIOUSLY REPORTED AND CALLED VALUE. Performed at [facility], [address], [place], [place] [address]   Comprehensive metabolic panel     Status: Abnormal   Collection Time: 09/02/20  6:35 PM  Result Value Ref Range   Sodium 135 135 - 145 mmol/L   Potassium 4.5 3.5 - 5.1 mmol/L   Chloride 104 98 - 111 mmol/L   CO2 22 22 - 32 mmol/L   Glucose, Bld 123 (H) 70 - 99 mg/dL    Comment: Glucose reference range applies only to samples taken after fasting for at least 8 hours.   BUN 15 6 - 20 mg/dL   Creatinine, Ser 5.62 (H) 0.44 - 1.00 mg/dL   Calcium 8.2 (L) 8.9 - 10.3 mg/dL   Total Protein 6.1 (L) 6.5 - 8.1 g/dL   Albumin 3.1 (L) 3.5 - 5.0 g/dL   AST 93 (H) 15 - 41 U/L   ALT 66 (H) 0 - 44 U/L   Alkaline Phosphatase 48 38 - 126 U/L   Total Bilirubin 0.5 0.3 - 1.2 mg/dL   GFR, Estimated 54 (L) >60 mL/min    Comment: (NOTE) Calculated using the CKD-EPI Creatinine Equation (2021)    Anion gap 9 5 - 15    Comment: Performed at [facility], [address] [place], [place]  [address]  Lactic acid, plasma     Status: Abnormal   Collection Time: 09/02/20  6:45 PM  Result Value Ref Range   Lactic Acid, Venous 2.7 (HH) 0.5 - 1.9 mmol/L    Comment: CRITICAL VALUE NOTED.  VALUE IS CONSISTENT WITH PREVIOUSLY REPORTED AND CALLED VALUE. Performed at [facility], [address], [place], [place] [address]   Blood gas, arterial     Status: Abnormal   Collection Time: 09/02/20 10:05 PM  Result Value Ref Range   FIO2 32.00    pH, Arterial 7.376 7.35 - 7.45   pCO2 arterial 42.3 32 - 48 mmHg   pO2, Arterial 71.4 (L) 83 - 108 mmHg   Bicarbonate 24.2 20.0 - 28.0 mmol/L   Acid-base deficit 0.3 0.0 - 2.0 mmol/L   O2 Saturation 93.8 %   Patient temperature 37.0    Collection site RIGHT RADIAL    Drawn by COLLECTED BY RT     Comment: NICK JOHNSTON   Sample type ARTERIAL  Allens test (pass/fail) PASS PASS    Comment: Performed at [facility], [address] [place], [place] [address]   CT Angio Chest PE W and/or Wo Contrast  Result Date: 09/02/2020 CLINICAL DATA:  Found unresponsive EXAM: CT ANGIOGRAPHY CHEST WITH CONTRAST TECHNIQUE: Multidetector CT imaging of the chest was performed using the standard protocol during bolus administration of intravenous contrast. Multiplanar CT image reconstructions and MIPs were obtained to evaluate the vascular anatomy. CONTRAST:  25mL OMNIPAQUE IOHEXOL 350 MG/ML SOLN COMPARISON:  None. FINDINGS: Cardiovascular: There is a optimal opacification of the pulmonary arteries. There is no central,segmental, or subsegmental filling defects within the pulmonary arteries. The heart is normal in size. No pericardial effusion or thickening. No evidence right heart strain. There is normal three-vessel brachiocephalic anatomy without proximal stenosis. The thoracic aorta is normal in appearance. Mediastinum/Nodes: No hilar, mediastinal, or axillary adenopathy. Thyroid gland, trachea, and esophagus demonstrate no significant findings.  Lungs/Pleura: Patchy tree-in-bud opacities are seen within the left upper lung and posterior left lung base. There is a small amount tree-in-bud patchy airspace opacities in the posterior left lung base. No pleural effusion or pneumothorax. Upper Abdomen: No acute abnormalities present in the visualized portions of the upper abdomen. Musculoskeletal: No chest wall abnormality. No acute or significant osseous findings. Review of the MIP images confirms the above findings. IMPRESSION: No central, segmental or subsegmental pulmonary embolism. Multifocal patchy airspace opacities which could be due to multifocal pneumonia pulmonary edema, or aspiration. Electronically Signed   By: Jonna Clark M.D.   On: 09/02/2020 15:58   MR BRAIN W WO CONTRAST  Addendum Date: 09/02/2020   ADDENDUM REPORT: 09/02/2020 22:12 ADDENDUM: These results will be called to the ordering clinician or representative by the Radiologist Assistant, and communication documented in the PACS or [employer]. Electronically Signed   By: Stana Bunting M.D.   On: 09/02/2020 22:12   Result Date: 09/02/2020 CLINICAL DATA:  Mental status change, unknown cause EXAM: MRI HEAD WITHOUT AND WITH CONTRAST TECHNIQUE: Multiplanar, multiecho pulse sequences of the brain and surrounding structures were obtained without and with intravenous contrast. CONTRAST:  8.62mL GADAVIST GADOBUTROL 1 MMOL/ML IV SOLN COMPARISON:  05/22/2019 head CT.  08/24/2018 MRI head. FINDINGS: Brain: Multifocal acute infarcts involving the left occipitotemporal region and left cerebellum. No intracranial hemorrhage. No midline shift, ventriculomegaly or extra-axial fluid collection. No mass lesion. No abnormal enhancement. Cerebral volume is within normal limits. Vascular: Normal flow voids. Skull and upper cervical spine: Normal marrow signal. Sinuses/Orbits: Normal orbits. Right maxillary and sphenoid sinus mucous retention cysts. Clear mastoid air cells. Other: Please note  that image quality is mildly degraded by motion artifact. 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 Electronically Signed: By: Stana Bunting M.D. On: 09/02/2020 21:44   DG Chest Portable 1 View  Result Date: 09/02/2020 CLINICAL DATA:  Altered mental status.  Drug overdose. EXAM: PORTABLE CHEST 1 VIEW COMPARISON:  Chest x-ray 03/29/2017. FINDINGS: Mediastinum is normal. Cardiomegaly. No pulmonary venous congestion. Diffuse left lung infiltrate consistent with pneumonia. No pleural effusion or pneumothorax. No acute bony abnormality. IMPRESSION: Diffuse left lung infiltrate consistent with pneumonia. Electronically Signed   By: Maisie Fus  Register   On: 09/02/2020 11:15   ECHOCARDIOGRAM COMPLETE  Result Date: 09/02/2020    ECHOCARDIOGRAM LIMITED REPORT   Patient Name:   Tabitha Smith Date of Exam: 09/02/2020 Medical Rec #:  [id number]       Height:       66.0  in Accession #:    [id number]      Weight:       185.0 lb Date of Birth:  [birth year]       BSA:          1.935 m Patient Age:    29 years        BP:           101/76 mmHg Patient Gender: F               HR:           73 bpm. Exam Location:  Inpatient Procedure: 2D Echo Indications:    AMS, loss of conciousness, sepsis  History:        Patient has no prior history of Echocardiogram examinations.                 CHF.  Sonographer:    Thurman Coyer RDCS (AE) Referring Phys: [id number] Estevan Ryder MCINTYRE IMPRESSIONS  1. Left ventricular ejection fraction, by estimation, is 25 to 30%. The left ventricle has severely decreased function. The left ventricle demonstrates global hypokinesis. Left ventricular diastolic parameters are consistent with Grade I diastolic dysfunction (impaired relaxation).  2. Right ventricular systolic function is moderately reduced. The right ventricular size is mildly enlarged. There is normal pulmonary artery systolic pressure. The estimated right ventricular systolic  pressure is 29.0 mmHg.  3. The pericardial effusion is posterior to the left ventricle. There is no evidence of cardiac tamponade.  4. The mitral valve is normal in structure. Moderate mitral valve regurgitation. No evidence of mitral stenosis.  5. Tricuspid valve regurgitation is moderate.  6. The aortic valve is normal in structure. Aortic valve regurgitation is not visualized. No aortic stenosis is present.  7. The inferior vena cava is dilated in size with <50% respiratory variability, suggesting right atrial pressure of 15 mmHg. FINDINGS  Left Ventricle: Left ventricular ejection fraction, by estimation, is 25 to 30%. The left ventricle has severely decreased function. The left ventricle demonstrates global hypokinesis. The left ventricular internal cavity size was normal in size. There is no left ventricular hypertrophy. Left ventricular diastolic parameters are consistent with Grade I diastolic dysfunction (impaired relaxation). Normal left ventricular filling pressure. Right Ventricle: The right ventricular size is mildly enlarged. No increase in right ventricular wall thickness. Right ventricular systolic function is moderately reduced. There is normal pulmonary artery systolic pressure. The tricuspid regurgitant velocity is 1.87 m/s, and with an assumed right atrial pressure of 15 mmHg, the estimated right ventricular systolic pressure is 29.0 mmHg. Left Atrium: Left atrial size was normal in size. Right Atrium: Right atrial size was normal in size. Pericardium: Trivial pericardial effusion is present. The pericardial effusion is posterior to the left ventricle. There is no evidence of cardiac tamponade. Mitral Valve: The mitral valve is normal in structure. Moderate mitral valve regurgitation. No evidence of mitral valve stenosis. Tricuspid Valve: The tricuspid valve is normal in structure. Tricuspid valve regurgitation is moderate . No evidence of tricuspid stenosis. Aortic Valve: The aortic valve is  normal in structure. Aortic valve regurgitation is not visualized. No aortic stenosis is present. Pulmonic Valve: The pulmonic valve was normal in structure. Pulmonic valve regurgitation is trivial. No evidence of pulmonic stenosis. Aorta: The aortic root is normal in size and structure. Venous: The inferior vena cava is dilated in size with less than 50% respiratory variability, suggesting right atrial pressure of 15 mmHg. IAS/Shunts: No atrial level shunt detected by color flow Doppler.  LEFT VENTRICLE PLAX 2D LVIDd:         5.11 cm      Diastology LVIDs:         4.15 cm      LV e' medial:    6.83 cm/s LV PW:         0.80 cm      LV E/e' medial:  6.2 LV IVS:        0.95 cm      LV e' lateral:   8.67 cm/s LVOT diam:     2.40 cm      LV E/e' lateral: 4.9 LV SV:         53 LV SV Index:   28 LVOT Area:     4.52 cm  LV Volumes (MOD) LV vol d, MOD A2C: 106.0 ml LV vol d, MOD A4C: 83.4 ml LV vol s, MOD A2C: 65.9 ml LV vol s, MOD A4C: 45.5 ml LV SV MOD A2C:     40.1 ml LV SV MOD A4C:     83.4 ml LV SV MOD BP:      40.7 ml RIGHT VENTRICLE RV S prime:     8.67 cm/s TAPSE (M-mode): 1.2 cm LEFT ATRIUM             Index       RIGHT ATRIUM           Index LA diam:        3.10 cm 1.60 cm/m  RA Area:     13.60 cm LA Vol (A2C):   50.0 ml 25.84 ml/m RA Volume:   33.60 ml  17.37 ml/m LA Vol (A4C):   25.0 ml 12.92 ml/m LA Biplane Vol: 35.9 ml 18.56 ml/m  AORTIC VALVE LVOT Vmax:   60.60 cm/s LVOT Vmean:  41.900 cm/s LVOT VTI:    0.118 m  AORTA Ao Root diam: 3.00 cm MITRAL VALVE                 TRICUSPID VALVE MV Area (PHT): 4.31 cm      TR Peak grad:   14.0 mmHg MV Decel Time: 176 msec      TR Vmax:        187.00 cm/s MR Peak grad:    69.2 mmHg MR Mean grad:    48.0 mmHg   SHUNTS MR Vmax:         416.00 cm/s Systemic VTI:  0.12 m MR Vmean:        331.0 cm/s  Systemic Diam: 2.40 cm MR PISA:         1.57 cm MR PISA Eff ROA: 17 mm MR PISA Radius:  0.50 cm MV E velocity: 42.20 cm/s MV A velocity: 38.60 cm/s MV E/A ratio:  1.09  Tobias Alexander MD Electronically signed by Tobias Alexander MD Signature Date/Time: 09/02/2020/5:00:07 PM    Final     Assessment: 29 y.o. female with AMS. 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 1. Exam reveals no focal neurological findings.  2. MRI brain: Multifocal small acute infarcts involving the left occipitotemporal region and left cerebellum. No intracranial hemorrhage. No midline shift, ventriculomegaly or extra-axial fluid collection. No mass lesion. No abnormal enhancement. Cerebral volume is within normal limits. 3. Stroke Risk Factors - Low EF on TTE 4. Overall description of patient when found down at home, as well as her response to Narcan, suggests a prolonged period of hypoxia due to decreased  respiratory drive. This could have precipitated a seizure, further complicating the picture. Prolonged hypoxia due to opiate overdose also could explain her AKI, elevated LFTs and cardiac dysfunction, as well as the small cerebral strokes. Alternatively, the strokes could be cardioembolic due to the acute hypokinesis seen on TTE.   Recommendations: 1. HgbA1c, fasting lipid panel 2. CTA of head and neck when renal function improves 3. PT consult, OT consult, Speech consult 4. TEE 5. Start ASA 81 mg po qd 6. Cardiac telemetry 7. Risk factor modification 8. Frequent neuro checks 9. Stroke in the young work up (hypercoagulable panel) 10. Should receive counseling regarding the potential dangers of opiate overuse, including acute loss of respiratory drive leading to diffuse cerebral ischemia, coma and death.  11. Would discontinue rimegepant and Aimovig. Both are CGRP receptor antagonists with cerebral vasodilatory effects. An intact, non-antagonized CGRP system may act as a vasodilatory safeguard during cerebral and cardiac ischemia, and blockage of the system could, therefore, potentially worsen ischemic events. Research reports for  use of these agents in patients with stroke is lacking, but there are case reports of stroke in young patients who are medicated with CGRP receptor antagonists. Lab research on non-human mammals reveals worse outcomes in experimental strokes when CGRP receptor antagonists have been administered.    @Electronically  signed: Dr.  09/02/2020, 10:31 PM

## 2020-09-02 NOTE — Sepsis Progress Note (Signed)
Notified bedside nurse of need to draw repeat lactic acid. 

## 2020-09-02 NOTE — Sepsis Progress Note (Signed)
Code sepsis protocol is being followed by eLink

## 2020-09-02 NOTE — ED Provider Notes (Signed)
[facility] EMERGENCY DEPARTMENT Provider Note   CSN: [id number] Arrival date & time: 09/02/20  1012     History Chief Complaint  Patient presents with  . Drug Overdose    Tabitha Smith is a 29 y.o. female.  The history is provided by the EMS personnel and medical records. No language interpreter was used.  Drug Overdose     29 year old female significant history of migraine recently started on Aimovig, brought here via EMS from home for evaluation of altered mental status.  Per EMS note, family found patient unresponsive at home.  Family report patient had a root canal done yesterday and went to work afterward.  When EMS arrived, patient was having decreased respiratory effort and was unresponsive.  Patient received 1 mg of Narcan intranasally and was temporarily bagged to help with her breathing.  She became more responsive with increased respiratory effort.  Patient received 400 mg of IV fluid and was brought here.  EMS did not notice any pill bottles or any obvious signs of injuries on initial exam.  Initial CBG was in the 200s.  The remainder of the history is limited as patient is altered.  Level 5 caveat applies.   Past Medical History:  Diagnosis Date  . Migraine   . Sinusitis     Patient Active Problem List   Diagnosis Date Noted  . Chronic migraine without aura, with intractable migraine, so stated, with status migrainosus 08/06/2018    Past Surgical History:  Procedure Laterality Date  . NO PAST SURGERIES    . stitches to abdomen      for a cut      OB History   No obstetric history on file.     Family History  Problem Relation Age of Onset  . Diabetes Mother   . Seizures Mother   . Cancer Mother        Colon  . Hypertension Mother   . Migraines Neg Hx   . Headache Neg Hx     Social History   Tobacco Use  . Smoking status: Current Every Day Smoker    Packs/day: 0.10    Types: Cigarettes  . Smokeless tobacco: Never Used  .  Tobacco comment: 1/2 pack a week now   Vaping Use  . Vaping Use: Never used  Substance Use Topics  . Alcohol use: Yes    Comment: occasionally  . Drug use: Never    Home Medications Prior to Admission medications   Medication Sig Start Date End Date Taking? Authorizing Provider  Erenumab-aooe (AIMOVIG) 70 MG/ML SOAJ Inject 70 mg into the skin every 14 (fourteen) days. 06/14/20   Anson Fret, MD  linaclotide (LINZESS) 145 MCG CAPS capsule Take 145 mcg by mouth daily before breakfast.    [provider]  mirtazapine (REMERON) 30 MG tablet Take 30 mg by mouth at bedtime.    [provider]  prazosin (MINIPRESS) 1 MG capsule Take 1 mg by mouth 2 (two) times daily.    [provider]  QUEtiapine (SEROQUEL) 100 MG tablet Take 100 mg by mouth at bedtime.    [provider]  Rimegepant Sulfate (NURTEC) 75 MG TBDP Take 75 mg by mouth daily as needed. For migraines. Take as close to onset of migraine as possible. One daily maximum. 06/14/20   Anson Fret, MD  zolpidem (AMBIEN) 10 MG tablet Take 10 mg by mouth at bedtime.    [provider]    Allergies  Amoxicillin and Penicillins  Review of Systems   Review of Systems  Unable to perform ROS: Mental status change    Physical Exam Updated Vital Signs BP (!) 86/53   Pulse (!) 136   Temp 98.2 F (36.8 C) (Oral)   Resp 18   SpO2 99%   Physical Exam Vitals and nursing note reviewed.  Constitutional:      Appearance: She is well-developed.     Comments: Patient is somnolent however arousable.  HENT:     Head: Normocephalic and atraumatic.     Mouth/Throat:     Comments: Small cut noted to left side of tongue.  No obvious signs of gingival or jaw trauma.  Dried saliva noted to the left side of mouth/face Eyes:     Conjunctiva/sclera: Conjunctivae normal.     Pupils: Pupils are equal, round, and reactive to light.  Cardiovascular:     Rate and Rhythm: Tachycardia present.      Pulses: Normal pulses.     Heart sounds: Normal heart sounds.  Pulmonary:     Effort: Pulmonary effort is normal.     Breath sounds: Normal breath sounds. No wheezing, rhonchi or rales.  Abdominal:     Palpations: Abdomen is soft.     Tenderness: There is no abdominal tenderness.  Musculoskeletal:     Cervical back: Normal range of motion and neck supple. No rigidity.     Comments: Able to move all 4 extremities  Skin:    Findings: No rash.  Neurological:     Mental Status: She is lethargic.     GCS: GCS eye subscore is 3. GCS verbal subscore is 4. GCS motor subscore is 6.     ED Results / Procedures / Treatments   Labs (all labs ordered are listed, but only abnormal results are displayed) Labs Reviewed  ACETAMINOPHEN LEVEL - Abnormal; Notable for the following components:      Result Value   Acetaminophen (Tylenol), Serum <10 (*)    All other components within normal limits  COMPREHENSIVE METABOLIC PANEL - Abnormal; Notable for the following components:   Potassium 5.9 (*)    CO2 20 (*)    Glucose, Bld 198 (*)    Creatinine, Ser 1.96 (*)    Calcium 8.4 (*)    AST 86 (*)    ALT 54 (*)    GFR, Estimated 34 (*)    Anion gap 17 (*)    All other components within normal limits  SALICYLATE LEVEL - Abnormal; Notable for the following components:   Salicylate Lvl <7.0 (*)    All other components within normal limits  RAPID URINE DRUG SCREEN, HOSP PERFORMED - Abnormal; Notable for the following components:   Opiates POSITIVE (*)    All other components within normal limits  URINALYSIS, ROUTINE W REFLEX MICROSCOPIC - Abnormal; Notable for the following components:   APPearance HAZY (*)    Glucose, UA 150 (*)    All other components within normal limits  LACTIC ACID, PLASMA - Abnormal; Notable for the following components:   Lactic Acid, Venous 2.9 (*)    All other components within normal limits  CBC WITH DIFFERENTIAL/PLATELET - Abnormal; Notable for the following  components:   WBC 24.3 (*)    RBC 3.80 (*)    Hemoglobin 10.9 (*)    HCT 35.2 (*)    Neutro Abs 21.5 (*)    Monocytes Absolute 1.5 (*)    Abs Immature Granulocytes 0.15 (*)  All other components within normal limits  TROPONIN I (HIGH SENSITIVITY) - Abnormal; Notable for the following components:   Troponin I (High Sensitivity) 205 (*)    All other components within normal limits  RESPIRATORY PANEL BY RT PCR (FLU A&B, COVID)  CULTURE, BLOOD (ROUTINE X 2)  CULTURE, BLOOD (ROUTINE X 2)  URINE CULTURE  ETHANOL  PROTIME-INR  APTT  LACTIC ACID, PLASMA  I-STAT BETA HCG BLOOD, ED ([facility], [facility], [facility] ONLY)  TROPONIN I (HIGH SENSITIVITY)    EKG EKG Interpretation  Date/Time:  Thursday September 02 2020 13:26:32 EDT Ventricular Rate:  98 PR Interval:    QRS Duration: 77 QT Interval:  343 QTC Calculation: 438 R Axis:   -25 Text Interpretation: Sinus rhythm Borderline left axis deviation Borderline low voltage, extremity leads When compared to prior, slower rate. No STEMI Confirmed by Theda Belfast ([id number]) on 09/02/2020 1:30:48 PM Also confirmed by Theda Belfast ([id number]), editor Elita Quick [phone])  on 09/02/2020 2:38:23 PM   Radiology DG Chest Portable 1 View  Result Date: 09/02/2020 CLINICAL DATA:  Altered mental status.  Drug overdose. EXAM: PORTABLE CHEST 1 VIEW COMPARISON:  Chest x-ray 03/29/2017. FINDINGS: Mediastinum is normal. Cardiomegaly. No pulmonary venous congestion. Diffuse left lung infiltrate consistent with pneumonia. No pleural effusion or pneumothorax. No acute bony abnormality. IMPRESSION: Diffuse left lung infiltrate consistent with pneumonia. Electronically Signed   By: Maisie Fus  Register   On: 09/02/2020 11:15    Procedures .Critical Care Performed by: Fayrene Helper, PA-C Authorized by: Fayrene Helper, PA-C   Critical care provider statement:    Critical care time (minutes):  65   Critical care was time spent personally by me on the following activities:   Discussions with consultants, evaluation of patient's response to treatment, examination of patient, ordering and performing treatments and interventions, ordering and review of laboratory studies, ordering and review of radiographic studies, pulse oximetry, re-evaluation of patient's condition, obtaining history from patient or surrogate and review of old charts   (including critical care time)  Medications Ordered in ED Medications  lactated ringers infusion (has no administration in time range)  lactated ringers bolus 1,000 mL (has no administration in time range)    And  lactated ringers bolus 1,000 mL (has no administration in time range)  levofloxacin (LEVAQUIN) IVPB 750 mg (has no administration in time range)  sodium chloride 0.9 % bolus 1,000 mL (1,000 mLs Intravenous New Bag/Given 09/02/20 1033)    ED Course  I have reviewed the triage vital signs and the nursing notes.  Pertinent labs & imaging results that were available during my care of the patient were reviewed by me and considered in my medical decision making (see chart for details).    MDM Rules/Calculators/A&P                          BP (!) 86/53   Pulse (!) 136   Temp 98.2 F (36.8 C) (Oral)   Resp 18   SpO2 99%   Final Clinical Impression(s) / ED Diagnoses Final diagnoses:  Sepsis with acute renal failure, due to unspecified organism, unspecified acute renal failure type, unspecified whether septic shock present (HCC)  Aspiration pneumonia of left lower lobe, unspecified aspiration pneumonia type (HCC)  Demand ischemia of myocardium (HCC)    Rx / DC Orders ED Discharge Orders    None     10:31 AM Patient found unresponsive by family member, she requires Narcan given at the scene as  well as temporary bagging to help with breathing.  She is becoming a bit more responsive however unable to provide any meaningful history.  On exam she does have multiple linear scars noted to her right forearm concerning for  self injures behavior.  In the setting of potential drug overdose, work-up initiated.  Patient is tachycardic on exam.  At this time she is protecting her airway, will monitor closely.  IVF given.  Care discussed with Dr. Rush Landmark  10:35 AM Patient is tachycardic and hypotensive.  Normal or attempt.  IV fluid given.  11:18 AM At this time patient is a little more cohesive and more alert.  She denies having suicidal ideation or any attempt to harm herself.  She does not complain of any pain.  She reports she had a root canal done yesterday but denies taking any pain medication.  She is still hypotensive and tachycardic however afebrile.  We will continue with IV hydration, will continue with work-up.  11:45 AM UDS showed positive opiate.  Urinalysis without signs of urine tract infection.  Chest x-ray shows diffuse left lung infiltrate consistent with pneumonia.  EKG shows sinus tachycardia troponin is elevated at 205 concerning for demand ischemia in the setting of potential sepsis.  Signs of AKI with creatinine of 1.96, mild transaminitis with AST 86, ALT 54, anion gap of 17 and a CO2 of 20.  COVID-19 test is currently pending.  Concern for potential PE therefore chest CTA ordered.  Code sepsis initiated, Levaquin started for pneumonia, will fluid resuscitation at 30 mL/kg.  Care discussed with Dr. Rush Landmark.   1:24 PM Patient is more alert and able to furnish additional history.  She denies suicidal ideation or attempt to harm herself.  She denies any recent medication changes.  She denies drugs or alcohol use.  She is endorsing heaviness in her chest as if someone is pressing against the bed.  As mentioned earlier she does have an elevated troponin of 205.  Will repeat EKG.  White count is elevated at 24.3 and her lactic acid is 2.9.  Code sepsis have been initiated.  I will also consult cardiology for recommendation.  Patient is fully vaccinated for COVID-19 and her Covid test is negative.  1:31  PM I have reached out to cardiology and spoke to cardiovascular triage who recommended for admitting team to cycle enzymes and if needed they can consult cardiology for further care.  Suspect demand ischemia.  Does not recommend heparinize at this time.  2:39 PM Appreciate consultation with Ladona Ridgel, MD from [facility] medicine who agrees to see and admit pt for further care.  Will hold off on pressor until if received her full fluid resuscitation at 37ml/kg.  Pt is now more alert.     Fayrene Helper, PA-C 09/02/20 1442    Tegeler, Canary Brim, MD 09/03/20 2037

## 2020-09-02 NOTE — ED Triage Notes (Signed)
Pt found unresponsive at home by family and brought in by EMS. EMS states she had root canal done yesterday and went to work right afterwards. Patient had low resps on fre dept arrival and was temporary begged for breathing. Pt received 1mg  narcan intranasally via EMS and had positive increase in respirations. Pt received fluid bolus PTA. Unknown drugs.

## 2020-09-02 NOTE — Progress Notes (Signed)
  Echocardiogram 2D Echocardiogram has been performed.  Tabitha Smith 09/02/2020, 4:46 PM

## 2020-09-02 NOTE — Progress Notes (Signed)
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 Our team reached out to Dr. Otelia Limes who is to see the patient and will put his recommendations in his note after seeing Tabitha Smith.  Additionally, RN Suzette Battiest reached out regarding patient's NPO status and night time meds. Patient has not yet had SLP eval performed and so far has not eaten/drank anything since being here in the hospital. As to avoid aspiration I feel it is OK to hold her Prazosin and Seroquel for tonight. Can look at resuming these tomorrow.   Tabitha Shoals, DO [facility], PGY-3 09/02/2020 10:50 PM

## 2020-09-02 NOTE — H&P (Signed)
Family Medicine Teaching [facility] Admission History and Physical Service Pager: [phone]  Patient name: Tabitha Smith Medical record number: [id number] Date of birth: May 20, 1991 Age: 29 y.o. Gender: female  Primary Care Provider: Carmel Sacramento, NP Consultants: Pharmacy, neurology Code Status: Full Code  Preferred Emergency Contact : [name] [phone]   Contact Information    Name Relation Home Work Mobile   Tabitha Smith Sister [phone]       Chief Complaint: Altered Mental Status   Assessment and Plan: Tabitha Smith is a 29 y.o. female who presented after being unresponsive at home around 1030 this morning and was admitted for further evaluation and work-up.  On arrival, patient was found to be septic with white count of 24.3, blood pressures in the 80s over 50s and lactic acid of 2.9.  Patient was afebrile.  Additionally, she did have troponin of 205 which increased to 223 Tabitha Smith's has Chronic migraine without aura, with intractable migraine, so stated, with status migrainosus; Sepsis (HCC); Depression; and History of suicidal ideation on their problem list. .   #Altered mental status  Differential for altered mental status includes intentional ingestion, seizures, hypoperfusion.  Ingestion less likely as patient denies medications yesterday except for 1 tablet of Tylenol #3.  Her UDS did come back positive with opiates (she denies any opiates being prescribed to her after procedure in PDMP clear).  Patient is very open about having a history of SI and attempts with overdose and cutting herself, though she reports she has not had any of these incidents for several years (last in 2018).  Can also consider altered mental status in the setting of hypotension, though patient has not had much improvement in mental status despite improving blood pressures. Seizures higher on differential given family history of seizures and given sister's report of finding patient  in bed, incontinent of urine, difficult to arouse, generalized muscle rigidity, and tongue bite.  Patient's mental status has not improved much during the day has not had any seizure-like activity, she may benefit from 12 to 24-hour EEG, which does require neurology consult, who has been consulted. They also recommend obtaining a B12 as B12 deficiencies can be unmasked by nitrous oxide (the patient denies any other medications besides lidocaine during her dental procedure).  We will also obtain MRI to rule out other causes of altered mental status. We will continue to monitor patient's mental status closely.  Patient sister is present at bedside at second evaluation around 1800 and reports that she is not close to her baseline and has not had much improvement since finding her earlier today. . Admit to FMTS, attending Dr. Pollie Meyer. Level of care: progressive  and with continuous cardiac monitoring  . Continuous cardiac monitoring  . Vitals per unit routine, O2 sats >92%, out of bed with assistance only . Neurology consulted, appreciate recommendations . Follow up ED labs: B12, lactic acid   . AM Labs CBC, CMP   . SLP consulted for speech and swallow eval  . PT/OT eval  . MRI brain   Aspiration versus community-acquired pneumonia On arrival, patient is afebrile, with new O2 requirement, and left-sided pneumonia on chest x-ray.  Patient denies any recent illness, cough, fever at home.  When she was found at home, she did have dried secretions from the side of her mouth making aspiration pneumonia more likely.  Covid negative.  Patient started on Levaquin in the ED and is currently stable on 3 L nasal cannula. Pulmonary exam with crackles and decreased  air movement on left side.  -IV Levaquin per pharmacy (needs height and weight for appropriate dosage).   Shock: Cardiogenic vs. Septic vs hypotension 2/2 toxic ingestion  Troponins elevated in the ED from 205 to 223.  Cardiology was consulted and echo  was obtained.  Echo showed severely reduced function with severe left ventricular hypokinesis with ejection fraction of 25 to 30%.  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 Patient has had improved blood pressures with fluid resuscitation; status post 5 L in the ED.  Will discontinue maintenance IV fluids given will discontinue maintenance IV fluids given severe hypokinesis. - cardiology consulted, appreciate recommendations  - d/c mIVF and provide boluses as needed   Elevated troponins EKG within normal limits.  No known risk factors for CAD.  Likely due to demand ischemia.  Patient denies any chest pain.  -Appreciate cardiology recommendations  Transaminitis AST ALT 86/54 and increased to 90/62.  Likely due to hypoperfusion.  We will continue to monitor.  Can also consider elevation secondary to possible ingestion. -AM CMP  AKI  Creatinine on arrival 1.96, improved to 1.36 with fluid resuscitation.  Likely pre-renal in the setting of hypotension. -Continue to monitor with a.m. BMP  Migraines Aimovig injection 70 mg q 14 days. Last dose 10/30. Also rx'ed Nurtec, but could not afford. No recent migraines.  - continue to monitor.   Depression, well controlled. Hx SI  At home, takes Cymbalta  daily, prazosin  BID, seroquel  QHS.  Patient denies any recent or current SI.  Last suicide attempt in 2018.  Patient reports compliance with her medications. - continue home medications   IBS, well controlled  Currently no symptoms.  - hold home linzess   #FEN/GI:  . Fluids: s/p 5L in ED.  Avoid maintenance in setting of worsened EF.  Marland Kitchen Electrolytes: replete PRN   . Nutrition:  NPO, SLP consulted   Access: PIV, Purewick  VTE prophylaxis: Lovenox 40 (CrCl>30)  Disposition: Admit to progressive .   ============================================================================= HPI Tabitha Smith is a 29 y.o. female with past medical history significant for depression with history of SI, migraines, IBS, who presents after being found unresponsive at home by her sister around 930 this morning.  Patient's sister is at bedside and is able to provide some history.  Patient reports that she had a root canal done 2 days ago (08/31/2020) but did not receive any pain medication afterwards.  She does report having a sore tooth yesterday and reports taking Tylenol 3 which she had received for previous dental procedure.  Patient denies taking any other medications.  Patient reports that last night, she went to work as usual and returned home around midnight.  She reports that she had a typical night, showered, ate and went to bed.  She does not remember waking up this morning.  Sister reports that she found her in bed this morning with a cut on her tongue and urinated on herself.  Patient reports that she feels that her muscles are very sore and weak at this time.  Patient reports the first thing she remembers this morning is her sister calling her name.  Both sister and patient deny any previous episodes like this.  Patient does not have any history of seizures, though family history of maternal seizures.  Patient denies any new medications recently.  She has not had any fevers, cough.  Patient does  report she has intermittent nausea, vomiting, diarrhea in the setting of her IBS.  She usually takes Linzess for this.  She has not had to take her Linzess for about a week and a half.  In the ED, patient's blood pressure is improved with fluid resuscitation (3 L plus maintenance). Abnormal Labs Reviewed  ACETAMINOPHEN LEVEL - Abnormal; Notable for the following components:      Result Value   Acetaminophen (Tylenol), Serum <10 (*)    All other components within normal limits  COMPREHENSIVE METABOLIC PANEL -  Abnormal; Notable for the following components:   Potassium 5.9 (*)    CO2 20 (*)    Glucose, Bld 198 (*)    Creatinine, Ser 1.96 (*)    Calcium 8.4 (*)    AST 86 (*)    ALT 54 (*)    GFR, Estimated 34 (*)    Anion gap 17 (*)    All other components within normal limits  SALICYLATE LEVEL - Abnormal; Notable for the following components:   Salicylate Lvl <7.0 (*)    All other components within normal limits  RAPID URINE DRUG SCREEN, HOSP PERFORMED - Abnormal; Notable for the following components:   Opiates POSITIVE (*)    All other components within normal limits  URINALYSIS, ROUTINE W REFLEX MICROSCOPIC - Abnormal; Notable for the following components:   APPearance HAZY (*)    Glucose, UA 150 (*)    All other components within normal limits  LACTIC ACID, PLASMA - Abnormal; Notable for the following components:   Lactic Acid, Venous 2.9 (*)    All other components within normal limits  LACTIC ACID, PLASMA - Abnormal; Notable for the following components:   Lactic Acid, Venous 3.2 (*)    All other components within normal limits  CBC WITH DIFFERENTIAL/PLATELET - Abnormal; Notable for the following components:   WBC 24.3 (*)    RBC 3.80 (*)    Hemoglobin 10.9 (*)    HCT 35.2 (*)    Neutro Abs 21.5 (*)    Monocytes Absolute 1.5 (*)    Abs Immature Granulocytes 0.15 (*)    All other components within normal limits  CK - Abnormal; Notable for the following components:   Total CK 427 (*)    All other components within normal limits  COMPREHENSIVE METABOLIC PANEL - Abnormal; Notable for the following components:   Sodium 134 (*)    Potassium 5.2 (*)    Glucose, Bld 139 (*)    Creatinine, Ser 1.36 (*)    Calcium 8.1 (*)    Total Protein 5.4 (*)    Albumin 3.1 (*)    AST 90 (*)    ALT 62 (*)    GFR, Estimated 53 (*)    All other components within normal limits  TROPONIN I (HIGH SENSITIVITY) - Abnormal; Notable for the following components:   Troponin I (High Sensitivity) 205  (*)    All other components within normal limits  TROPONIN I (HIGH SENSITIVITY) - Abnormal; Notable for the following components:   Troponin I (High Sensitivity) 223 (*)    All other components within normal limits    Review Of Systems: Review of Systems  Constitutional: Negative for activity change, chills, fatigue and fever.  HENT: Negative for congestion, drooling, rhinorrhea, sinus pressure, sinus pain, sneezing, sore throat and trouble swallowing.   Eyes: Negative for visual disturbance.  Respiratory: Positive for shortness of breath. Negative for cough, choking and chest tightness.   Cardiovascular: Negative for  chest pain, palpitations and leg swelling.  Gastrointestinal: Negative for abdominal pain, constipation, diarrhea, nausea and vomiting.  Endocrine: Negative.   Genitourinary: Negative for dysuria and hematuria.  Musculoskeletal: Positive for neck pain. Negative for arthralgias, back pain, gait problem, joint swelling, myalgias and neck stiffness.  Skin: Negative for rash and wound.  Neurological: Positive for dizziness, speech difficulty and weakness. Negative for seizures, facial asymmetry, light-headedness, numbness and headaches.  Psychiatric/Behavioral: Positive for confusion. Negative for dysphoric mood, self-injury and suicidal ideas.   Patient Active Problem List   Diagnosis Date Noted  . Sepsis (HCC) 09/02/2020  . Depression 09/02/2020  . History of suicidal ideation 09/02/2020  . Chronic migraine without aura, with intractable migraine, so stated, with status migrainosus 08/06/2018   Past Medical History: Past Medical History:  Diagnosis Date  . Migraine   . Sinusitis     Past Surgical History: Past Surgical History:  Procedure Laterality Date  . NO PAST SURGERIES    . stitches to abdomen      for a cut     Family History: family history includes Cancer in her mother; Diabetes in her mother; Hypertension in her mother; Seizures in her mother. There  is no history of Migraines or Headache.   Social History: Social History   Social History Narrative   Lives at home with family   Left handed   Caffeine: coffee, 2-4 cups/day    Derrisha reports that she has been smoking cigarettes. She has been smoking about 0.10 packs per day. She has never used smokeless tobacco. She reports current alcohol use. She reports that she does not use drugs.  Allergies and Medications: Allergies  Allergen Reactions  . Amoxicillin Anaphylaxis  . Penicillins Anaphylaxis    Has patient had a PCN reaction causing immediate rash, facial/tongue/throat swelling, SOB or lightheadedness with hypotension: Yes Has patient had a PCN reaction causing severe rash involving mucus membranes or skin necrosis: Unk Has patient had a PCN reaction that required hospitalization: No Has patient had a PCN reaction occurring within the last 10 years: No If all of the above answers are "NO", then may proceed with Cephalosporin use.    Current Meds  Medication Sig  . DULoxetine (CYMBALTA) 60 MG capsule Take 60 mg by mouth daily.  Dorise Hiss (AIMOVIG) 70 MG/ML SOAJ Inject 70 mg into the skin every 14 (fourteen) days.  Marland Kitchen linaclotide (LINZESS) 145 MCG CAPS capsule Take 145 mcg by mouth every other day.   . prazosin (MINIPRESS) 1 MG capsule Take 1 mg by mouth 2 (two) times daily.  . QUEtiapine (SEROQUEL XR) 50 MG TB24 24 hr tablet Take 50 mg by mouth at bedtime.  . Vitamin D, Ergocalciferol, (DRISDOL) 1.25 MG (50000 UNIT) CAPS capsule Take 50,000 Units by mouth once a week. Saturday  . zolpidem (AMBIEN) 10 MG tablet Take 10 mg by mouth at bedtime.  . [DISCONTINUED] QUEtiapine (SEROQUEL) 100 MG tablet Take 100 mg by mouth at bedtime.    Objective: BP 91/66 (BP Location: Right Arm)   Pulse 97   Temp 98.4 F (36.9 C) (Oral)   Resp 19   SpO2 93%  There were no vitals filed for this visit. Patient Vitals for the past 24 hrs:  BP Temp Temp src Pulse Resp SpO2  09/02/20  1550 91/66 98.4 F (36.9 C) Oral 97 19 93 %  09/02/20 1505 101/76 -- -- 87 18 97 %  09/02/20 1445 92/65 -- -- 90 18 97 %  09/02/20 1430 95/71 -- --  90 19 98 %  09/02/20 1415 (!) 85/71 -- -- 99 16 97 %  09/02/20 1400 93/74 -- -- 97 20 98 %  09/02/20 1345 (!) 88/69 -- -- (!) 107 16 99 %  09/02/20 1330 (!) 88/54 -- -- (!) 106 15 95 %  09/02/20 1315 (!) 76/51 -- -- (!) 109 (!) 23 97 %  09/02/20 1300 (!) 83/58 -- -- (!) 108 16 99 %  09/02/20 1245 (!) 80/59 -- -- 95 11 96 %  09/02/20 1230 (!) 81/59 -- -- (!) 107 14 99 %  09/02/20 1215 (!) 82/50 -- -- (!) 111 14 93 %  09/02/20 1200 (!) 80/65 -- -- (!) 125 20 98 %  09/02/20 1131 (!) 77/57 98.1 F (36.7 C) Oral (!) 116 15 97 %  09/02/20 1130 (!) 75/49 -- -- (!) 122 13 95 %  09/02/20 1115 (!) 76/61 -- -- (!) 126 20 97 %  09/02/20 1100 (!) 75/51 -- -- (!) 114 19 95 %  09/02/20 1045 (!) 76/42 -- -- (!) 123 19 98 %  09/02/20 1030 (!) 86/53 -- -- (!) 136 18 99 %  09/02/20 1025 (!) 84/49 98.2 F (36.8 C) Oral (!) 133 (!) 24 98 %  09/02/20 1018 -- -- -- -- -- 94 %   Exam: Physical Exam Vitals and nursing note reviewed.  Constitutional:      General: She is not in acute distress.    Appearance: Normal appearance. She is normal weight. She is not toxic-appearing.  HENT:     Head: Normocephalic and atraumatic.     Nose: Nose normal.     Mouth/Throat:     Mouth: Mucous membranes are moist.     Pharynx: Oropharynx is clear. No oropharyngeal exudate or posterior oropharyngeal erythema.     Comments: Tenderness to palpation of right lower mandible at site of recent root canal Eyes:     Extraocular Movements: Extraocular movements intact.     Conjunctiva/sclera: Conjunctivae normal.     Pupils: Pupils are equal, round, and reactive to light.  Neck:     Comments: Tenderness palpation of left SCM Cardiovascular:     Rate and Rhythm: Normal rate and regular rhythm.     Pulses: Normal pulses.     Heart sounds: Normal heart sounds.  Pulmonary:      Effort: Pulmonary effort is normal. No accessory muscle usage.     Breath sounds: Decreased air movement present. Examination of the left-upper field reveals decreased breath sounds and rales. Examination of the left-middle field reveals decreased breath sounds and rales. Examination of the left-lower field reveals decreased breath sounds and rales. Decreased breath sounds and rales present.  Chest:     Chest wall: No tenderness.  Abdominal:     General: Abdomen is flat. Bowel sounds are normal. There is no distension.     Palpations: Abdomen is soft.     Tenderness: There is no abdominal tenderness. There is no guarding.  Musculoskeletal:        General: No swelling or tenderness. Normal range of motion.     Cervical back: Normal range of motion and neck supple.     Right lower leg: No edema.     Left lower leg: No edema.  Skin:    General: Skin is warm and dry.     Capillary Refill: Capillary refill takes less than 2 seconds.  Neurological:     Mental Status: She is alert and oriented to person, place, and  time.     GCS: GCS eye subscore is 4. GCS verbal subscore is 5. GCS motor subscore is 6.     Cranial Nerves: No cranial nerve deficit.     Sensory: No sensory deficit.     Motor: No weakness, tremor or abnormal muscle tone.     Coordination: Coordination normal.     Deep Tendon Reflexes:     Reflex Scores:      Patellar reflexes are 2+ on the right side and 2+ on the left side.    Comments: 5/5 strength in all major muscle groups in upper/lower extremities. Slowed and slurred speech. Slowed movements.   Psychiatric:        Mood and Affect: Mood normal.        Behavior: Behavior normal.        Thought Content: Thought content normal.        Judgment: Judgment normal.      Labs and Imaging: I have personally reviewed following labs and imaging studies CBC: Recent Labs  Lab 09/02/20 1220  WBC 24.3*  NEUTROABS 21.5*  HGB 10.9*  HCT 35.2*  MCV 92.6  PLT 220    CMP: Recent Labs  Lab 09/02/20 1026 09/02/20 1557  NA 138 134*  K 5.9* 5.2*  CL 101 103  CO2 20* 22  GLUCOSE 198* 139*  BUN 18 17  CREATININE 1.96* 1.36*  CALCIUM 8.4* 8.1*  ALBUMIN 3.6 3.1*   GFR: CrCl cannot be calculated (Unknown ideal weight.).  Liver Function Tests: Recent Labs  Lab 09/02/20 1026 09/02/20 1557  AST 86* 90*  ALT 54* 62*  ALKPHOS 64 51  BILITOT 0.4 0.3  PROT 6.6 5.4*  ALBUMIN 3.6 3.1*   No results for input(s): LIPASE, AMYLASE, AMMONIA in the last 168 hours.  Coagulation Profile: Recent Labs  Lab 09/02/20 1220  INR 1.2    Cardiac Enzymes: Recent Labs  Lab 09/02/20 1557  CKTOTAL 427*    BNP (last 3 results) No results for input(s): PROBNP in the last 72 hours.  HbA1C: No results for input(s): HGBA1C in the last 72 hours.  CBG: No results for input(s): GLUCAP in the last 168 hours.  Lipid Profile: No results for input(s): CHOL, HDL, LDLCALC, TRIG, CHOLHDL, LDLDIRECT in the last 72 hours.  Thyroid Function Tests: No results for input(s): TSH, T4TOTAL, FREET4, T3FREE, THYROIDAB in the last 72 hours.  Anemia Panel: No results for input(s): VITAMINB12, FOLATE, FERRITIN, TIBC, IRON, RETICCTPCT in the last 72 hours.  Urine analysis: Recent Labs    09/02/20 1034  COLORURINE YELLOW  APPEARANCEUR HAZY*  LABSPEC 1.013  PHURINE 5.0  GLUCOSEU 150*  HGBUR NEGATIVE  BILIRUBINUR NEGATIVE  KETONESUR NEGATIVE  PROTEINUR NEGATIVE  NITRITE NEGATIVE  LEUKOCYTESUR NEGATIVE    Sepsis Labs:   Recent Results (from the past 240 hour(s))  Respiratory Panel by RT PCR (Flu A&B, Covid) - Urine, Clean Catch     Status: None   Collection Time: 09/02/20 10:33 AM   Specimen: Urine, Clean Catch; Nasopharyngeal  Result Value Ref Range Status   SARS Coronavirus 2 by RT PCR NEGATIVE NEGATIVE Final    Comment: (NOTE) SARS-CoV-2 target nucleic acids are NOT DETECTED.  The SARS-CoV-2 RNA is generally detectable in upper respiratoy specimens  during the acute phase of infection. The lowest concentration of SARS-CoV-2 viral copies this assay can detect is 131 copies/mL. A negative result does not preclude SARS-Cov-2 infection and should not be used as the sole basis for treatment or other  patient management decisions. A negative result may occur with  improper specimen collection/handling, submission of specimen other than nasopharyngeal swab, presence of viral mutation(s) within the areas targeted by this assay, and inadequate number of viral copies (<131 copies/mL). A negative result must be combined with clinical observations, patient history, and epidemiological information. The expected result is Negative.  Fact Sheet for Patients:  https://www.moore.com/  Fact Sheet for Healthcare Providers:  https://www.young.biz/  This test is no t yet approved or cleared by the [place] FDA and  has been authorized for detection and/or diagnosis of SARS-CoV-2 by FDA under an Emergency Use Authorization (EUA). This EUA will remain  in effect (meaning this test can be used) for the duration of the COVID-19 declaration under Section 564(b)(1) of the Act, 21 U.S.C. section 360bbb-3(b)(1), unless the authorization is terminated or revoked sooner.     Influenza A by PCR NEGATIVE NEGATIVE Final   Influenza B by PCR NEGATIVE NEGATIVE Final    Comment: (NOTE) The Xpert Xpress SARS-CoV-2/FLU/RSV assay is intended as an aid in  the diagnosis of influenza from Nasopharyngeal swab specimens and  should not be used as a sole basis for treatment. Nasal washings and  aspirates are unacceptable for Xpert Xpress SARS-CoV-2/FLU/RSV  testing.  Fact Sheet for Patients: https://www.moore.com/  Fact Sheet for Healthcare Providers: https://www.young.biz/  This test is not yet approved or cleared by the [place] FDA and  has been authorized for detection and/or  diagnosis of SARS-CoV-2 by  FDA under an Emergency Use Authorization (EUA). This EUA will remain  in effect (meaning this test can be used) for the duration of the  Covid-19 declaration under Section 564(b)(1) of the Act, 21  U.S.C. section 360bbb-3(b)(1), unless the authorization is  terminated or revoked. Performed at [facility], [address], [place], [place] [address]      Imaging/Diagnostic Tests: CT Angio Chest PE W and/or Wo Contrast  Result Date: 09/02/2020 CLINICAL DATA:  Found unresponsive EXAM: CT ANGIOGRAPHY CHEST WITH CONTRAST TECHNIQUE: Multidetector CT imaging of the chest was performed using the standard protocol during bolus administration of intravenous contrast. Multiplanar CT image reconstructions and MIPs were obtained to evaluate the vascular anatomy. CONTRAST:  59mL OMNIPAQUE IOHEXOL 350 MG/ML SOLN COMPARISON:  None. FINDINGS: Cardiovascular: There is a optimal opacification of the pulmonary arteries. There is no central,segmental, or subsegmental filling defects within the pulmonary arteries. The heart is normal in size. No pericardial effusion or thickening. No evidence right heart strain. There is normal three-vessel brachiocephalic anatomy without proximal stenosis. The thoracic aorta is normal in appearance. Mediastinum/Nodes: No hilar, mediastinal, or axillary adenopathy. Thyroid gland, trachea, and esophagus demonstrate no significant findings. Lungs/Pleura: Patchy tree-in-bud opacities are seen within the left upper lung and posterior left lung base. There is a small amount tree-in-bud patchy airspace opacities in the posterior left lung base. No pleural effusion or pneumothorax. Upper Abdomen: No acute abnormalities present in the visualized portions of the upper abdomen. Musculoskeletal: No chest wall abnormality. No acute or significant osseous findings. Review of the MIP images confirms the above findings. IMPRESSION: No central, segmental or subsegmental  pulmonary embolism. Multifocal patchy airspace opacities which could be due to multifocal pneumonia pulmonary edema, or aspiration. Electronically Signed   By: Jonna Clark M.D.   On: 09/02/2020 15:58   DG Chest Portable 1 View  Result Date: 09/02/2020 CLINICAL DATA:  Altered mental status.  Drug overdose. EXAM: PORTABLE CHEST 1 VIEW COMPARISON:  Chest x-ray 03/29/2017. FINDINGS: Mediastinum  is normal. Cardiomegaly. No pulmonary venous congestion. Diffuse left lung infiltrate consistent with pneumonia. No pleural effusion or pneumothorax. No acute bony abnormality. IMPRESSION: Diffuse left lung infiltrate consistent with pneumonia. Electronically Signed   By: Maisie Fus  Register   On: 09/02/2020 11:15   ECHOCARDIOGRAM COMPLETE  Result Date: 09/02/2020    ECHOCARDIOGRAM LIMITED REPORT   Patient Name:   [facility] Date of Exam: 09/02/2020 Medical Rec #:  [id number]       Height:       66.0 in Accession #:    [id number]      Weight:       185.0 lb Date of Birth:  11-Sep-1991       BSA:          1.935 m Patient Age:    29 years        BP:           101/76 mmHg Patient Gender: F               HR:           73 bpm. Exam Location:  Inpatient Procedure: 2D Echo Indications:    AMS, loss of conciousness, sepsis  History:        Patient has no prior history of Echocardiogram examinations.                 CHF.  Sonographer:    Thurman Coyer RDCS (AE) Referring Phys: [id number] Estevan Ryder MCINTYRE IMPRESSIONS  1. Left ventricular ejection fraction, by estimation, is 25 to 30%. The left ventricle has severely decreased function. The left ventricle demonstrates global hypokinesis. Left ventricular diastolic parameters are consistent with Grade I diastolic dysfunction (impaired relaxation).  2. Right ventricular systolic function is moderately reduced. The right ventricular size is mildly enlarged. There is normal pulmonary artery systolic pressure. The estimated right ventricular systolic pressure is 29.0 mmHg.  3. The  pericardial effusion is posterior to the left ventricle. There is no evidence of cardiac tamponade.  4. The mitral valve is normal in structure. Moderate mitral valve regurgitation. No evidence of mitral stenosis.  5. Tricuspid valve regurgitation is moderate.  6. The aortic valve is normal in structure. Aortic valve regurgitation is not visualized. No aortic stenosis is present.  7. The inferior vena cava is dilated in size with <50% respiratory variability, suggesting right atrial pressure of 15 mmHg. FINDINGS  Left Ventricle: Left ventricular ejection fraction, by estimation, is 25 to 30%. The left ventricle has severely decreased function. The left ventricle demonstrates global hypokinesis. The left ventricular internal cavity size was normal in size. There is no left ventricular hypertrophy. Left ventricular diastolic parameters are consistent with Grade I diastolic dysfunction (impaired relaxation). Normal left ventricular filling pressure. Right Ventricle: The right ventricular size is mildly enlarged. No increase in right ventricular wall thickness. Right ventricular systolic function is moderately reduced. There is normal pulmonary artery systolic pressure. The tricuspid regurgitant velocity is 1.87 m/s, and with an assumed right atrial pressure of 15 mmHg, the estimated right ventricular systolic pressure is 29.0 mmHg. Left Atrium: Left atrial size was normal in size. Right Atrium: Right atrial size was normal in size. Pericardium: Trivial pericardial effusion is present. The pericardial effusion is posterior to the left ventricle. There is no evidence of cardiac tamponade. Mitral Valve: The mitral valve is normal in structure. Moderate mitral valve regurgitation. No evidence of mitral valve stenosis. Tricuspid Valve: The tricuspid valve is normal in structure. Tricuspid valve regurgitation is  moderate . No evidence of tricuspid stenosis. Aortic Valve: The aortic valve is normal in structure. Aortic valve  regurgitation is not visualized. No aortic stenosis is present. Pulmonic Valve: The pulmonic valve was normal in structure. Pulmonic valve regurgitation is trivial. No evidence of pulmonic stenosis. Aorta: The aortic root is normal in size and structure. Venous: The inferior vena cava is dilated in size with less than 50% respiratory variability, suggesting right atrial pressure of 15 mmHg. IAS/Shunts: No atrial level shunt detected by color flow Doppler. LEFT VENTRICLE PLAX 2D LVIDd:         5.11 cm      Diastology LVIDs:         4.15 cm      LV e' medial:    6.83 cm/s LV PW:         0.80 cm      LV E/e' medial:  6.2 LV IVS:        0.95 cm      LV e' lateral:   8.67 cm/s LVOT diam:     2.40 cm      LV E/e' lateral: 4.9 LV SV:         53 LV SV Index:   28 LVOT Area:     4.52 cm  LV Volumes (MOD) LV vol d, MOD A2C: 106.0 ml LV vol d, MOD A4C: 83.4 ml LV vol s, MOD A2C: 65.9 ml LV vol s, MOD A4C: 45.5 ml LV SV MOD A2C:     40.1 ml LV SV MOD A4C:     83.4 ml LV SV MOD BP:      40.7 ml RIGHT VENTRICLE RV S prime:     8.67 cm/s TAPSE (M-mode): 1.2 cm LEFT ATRIUM             Index       RIGHT ATRIUM           Index LA diam:        3.10 cm 1.60 cm/m  RA Area:     13.60 cm LA Vol (A2C):   50.0 ml 25.84 ml/m RA Volume:   33.60 ml  17.37 ml/m LA Vol (A4C):   25.0 ml 12.92 ml/m LA Biplane Vol: 35.9 ml 18.56 ml/m  AORTIC VALVE LVOT Vmax:   60.60 cm/s LVOT Vmean:  41.900 cm/s LVOT VTI:    0.118 m  AORTA Ao Root diam: 3.00 cm MITRAL VALVE                 TRICUSPID VALVE MV Area (PHT): 4.31 cm      TR Peak grad:   14.0 mmHg MV Decel Time: 176 msec      TR Vmax:        187.00 cm/s MR Peak grad:    69.2 mmHg MR Mean grad:    48.0 mmHg   SHUNTS MR Vmax:         416.00 cm/s Systemic VTI:  0.12 m MR Vmean:        331.0 cm/s  Systemic Diam: 2.40 cm MR PISA:         1.57 cm MR PISA Eff ROA: 17 mm MR PISA Radius:  0.50 cm MV E velocity: 42.20 cm/s MV A velocity: 38.60 cm/s MV E/A ratio:  1.09 Tobias Alexander MD Electronically  signed by Tobias Alexander MD Signature Date/Time: 09/02/2020/5:00:07 PM    Final     EKG Interpretation  Date/Time:  Thursday September 02 2020 13:26:32 EDT Ventricular  Rate:  98 PR Interval:    QRS Duration: 77 QT Interval:  343 QTC Calculation: 438 R Axis:   -25 Text Interpretation: Sinus rhythm Borderline left axis deviation Borderline low voltage, extremity leads When compared to prior, slower rate. No STEMI Confirmed by Theda Belfast ([id number]) on 09/02/2020 1:30:48 PM Also confirmed by Theda Belfast ([id number]), editor Elita Quick [phone])  on 09/02/2020 2:38:23 PM        Melene Plan, M.D. 09/02/2020, 6:53 PM PGY-3, [facility] FPTS Intern pager: [phone], text pages welcome

## 2020-09-02 NOTE — Consult Note (Addendum)
Cardiology Consultation:   Patient ID: Tabitha Smith; [id number]; January 28, 1991   Admit date: 09/02/2020 Date of Consult: 09/02/2020  Primary Care Provider: Carmel Sacramento, NP Primary Cardiologist: none Primary Electrophysiologist:  none   Patient Profile:   Tabitha Smith is a 29 y.o. female with a hx of migraine, depression, and insomnia, who is being seen today for the evaluation of elevated troponin at the request of EDP.  History of Present Illness:   Tabitha Smith [facility] ED with a chief complaint of alter mental status. Patient states that she had a root canal performed two days ago and was able to attend work the subsequent day without issue. She states she has been taking a lot of over the counter NSAID to help with the pain, but denies any illicit drug or ETOH use. Patient states tht she is not exactly sure what happened this morning, but states that a family member found her down in her room and was difficult to arouse. EMS was called and noted labored breathing that improved with narcan. Patient continues to appear altered from baseline and sluggish and admits to some orthostatic dizziness. Otherwise, she denies chest pain, shortness of breath, fevers, myalgias, arthralgias, recent sick contacts, cough or changes in medications   In the ED, patient was found to have sinus tachycardia, hypotension, lactic acidemia, and leukocytosis with a left shift and CXR showing diffuse left lung infiltrates concerning for sepsis secondary to pneumonia.   concerning for  Past Medical History:  Diagnosis Date  . Migraine   . Sinusitis     Past Surgical History:  Procedure Laterality Date  . NO PAST SURGERIES    . stitches to abdomen      for a cut      Home Medications:  Prior to Admission medications   Medication Sig Start Date End Date Taking? Authorizing Provider  Erenumab-aooe (AIMOVIG) 70 MG/ML SOAJ Inject 70 mg into the skin every 14 (fourteen) days. 06/14/20   Anson Fret, MD  linaclotide (LINZESS) 145 MCG CAPS capsule Take 145 mcg by mouth daily before breakfast.    [provider]  mirtazapine (REMERON) 30 MG tablet Take 30 mg by mouth at bedtime.    [provider]  prazosin (MINIPRESS) 1 MG capsule Take 1 mg by mouth 2 (two) times daily.    [provider]  QUEtiapine (SEROQUEL) 100 MG tablet Take 100 mg by mouth at bedtime.    [provider]  Rimegepant Sulfate (NURTEC) 75 MG TBDP Take 75 mg by mouth daily as needed. For migraines. Take as close to onset of migraine as possible. One daily maximum. 06/14/20   Anson Fret, MD  zolpidem (AMBIEN) 10 MG tablet Take 10 mg by mouth at bedtime.    [provider]   Not taking mirtazapine, Rimegepant    Inpatient Medications: Scheduled Meds:  Continuous Infusions: . lactated ringers 150 mL/hr at 09/02/20 1234  . [START ON 09/04/2020] levofloxacin (LEVAQUIN) IV     PRN Meds:   Allergies:    Allergies  Allergen Reactions  . Amoxicillin Anaphylaxis  . Penicillins Anaphylaxis    Has patient had a PCN reaction causing immediate rash, facial/tongue/throat swelling, SOB or lightheadedness with hypotension: Yes Has patient had a PCN reaction causing severe rash involving mucus membranes or skin necrosis: Unk Has patient had a PCN reaction that required hospitalization: No Has patient had a PCN reaction occurring within the last 10 years: No If all of the above  answers are "NO", then may proceed with Cephalosporin use.     Social History:   Social History   Socioeconomic History  . Marital status: Single    Spouse name: Not on file  . Number of children: 1  . Years of education: Not on file  . Highest education level: High school graduate  Occupational History  . Not on file  Tobacco Use  . Smoking status: Current Every Day Smoker    Packs/day: 0.10    Types: Cigarettes  . Smokeless tobacco: Never Used  . Tobacco comment: 1/2 pack a week now    Vaping Use  . Vaping Use: Never used  Substance and Sexual Activity  . Alcohol use: Yes    Comment: occasionally  . Drug use: Never  . Sexual activity: Not on file  Other Topics Concern  . Not on file  Social History Narrative   Lives at home with family   Left handed   Caffeine: coffee, 2-4 cups/day   Social Determinants of Health   Financial Resource Strain:   . Difficulty of Paying Living Expenses: Not on file  Food Insecurity:   . Worried About Programme researcher, broadcasting/film/video in the Last Year: Not on file  . Ran Out of Food in the Last Year: Not on file  Transportation Needs:   . Lack of Transportation (Medical): Not on file  . Lack of Transportation (Non-Medical): Not on file  Physical Activity:   . Days of Exercise per Week: Not on file  . Minutes of Exercise per Session: Not on file  Stress:   . Feeling of Stress : Not on file  Social Connections:   . Frequency of Communication with Friends and Family: Not on file  . Frequency of Social Gatherings with Friends and Family: Not on file  . Attends Religious Services: Not on file  . Active Member of Clubs or Organizations: Not on file  . Attends Banker Meetings: Not on file  . Marital Status: Not on file  Intimate Partner Violence:   . Fear of Current or Ex-Partner: Not on file  . Emotionally Abused: Not on file  . Physically Abused: Not on file  . Sexually Abused: Not on file    Family History:   Family History  Problem Relation Age of Onset  . Diabetes Mother   . Seizures Mother   . Cancer Mother        Colon  . Hypertension Mother   . Migraines Neg Hx   . Headache Neg Hx      ROS:  Please see the history of present illness.  Review of Systems    All other ROS reviewed and negative.     Physical Exam/Data:   Vitals:   09/02/20 1315 09/02/20 1330 09/02/20 1345 09/02/20 1400  BP: (!) 76/51 (!) 88/54 (!) 88/69 93/74  Pulse: (!) 109 (!) 106 (!) 107 97  Resp: (!) 23 15 16 20   Temp:      TempSrc:       SpO2: 97% 95% 99% 98%    Intake/Output Summary (Last 24 hours) at 09/02/2020 1424 Last data filed at 09/02/2020 1018 Gross per 24 hour  Intake 400 ml  Output --  Net 400 ml   There were no vitals filed for this visit. There is no height or weight on file to calculate BMI.  General: Alert and oriented x3, but sleepy/sluggish HEENT: normal Lymph: no adenopathy Neck: no JVD Endocrine:  No thryomegaly Vascular:  No carotid bruits; FA pulses 2+ bilaterally without bruits  Cardiac:  normal S1, S2; RRR; no murmur rubs or gallops Lungs: diffuse left sided rales Abd: soft, nontender, no hepatomegaly  Ext: cool lower extremities with 2+ palpable DP/PT pulses without edema bilaterally. Musculoskeletal:  No deformities, BUE and BLE strength normal and equal Skin: cool and dry Neuro:  CNs 2-12 grossly intact, no focal abnormalities noted Psych:  Difficult to assess due to mildly altered mental status   EKG:  The EKG was personally reviewed and demonstrates:  Sinus rhythm with left axis deviation.  Telemetry:  Telemetry was personally reviewed and demonstrates:  sinus  Relevant CV Studies: Troponin 200/230  Echocardiogram: 1. Left ventricular ejection fraction, by estimation, is 25 to 30%. The  left ventricle has severely decreased function. The left ventricle  demonstrates global hypokinesis. Left ventricular diastolic parameters are  consistent with Grade I diastolic  dysfunction (impaired relaxation).  2. Right ventricular systolic function is moderately reduced. The right  ventricular size is mildly enlarged. There is normal pulmonary artery  systolic pressure. The estimated right ventricular systolic pressure is  29.0 mmHg.  3. The pericardial effusion is posterior to the left ventricle. There is  no evidence of cardiac tamponade.  4. The mitral valve is normal in structure. Moderate mitral valve  regurgitation. No evidence of mitral stenosis.  5. Tricuspid valve  regurgitation is moderate.  6. The aortic valve is normal in structure. Aortic valve regurgitation is  not visualized. No aortic stenosis is present.  7. The inferior vena cava is dilated in size with <50% respiratory  variability, suggesting right atrial pressure of 15 mmHg.   CTA: No central, segmental or subsegmental pulmonary embolism.  Multifocal patchy airspace opacities which could be due to multifocal pneumonia pulmonary edema, or aspiration.  Laboratory Data:  Chemistry Recent Labs  Lab 09/02/20 1026  NA 138  K 5.9*  CL 101  CO2 20*  GLUCOSE 198*  BUN 18  CREATININE 1.96*  CALCIUM 8.4*  GFRNONAA 34*  ANIONGAP 17*    Recent Labs  Lab 09/02/20 1026  PROT 6.6  ALBUMIN 3.6  AST 86*  ALT 54*  ALKPHOS 64  BILITOT 0.4   Hematology Recent Labs  Lab 09/02/20 1220  WBC 24.3*  RBC 3.80*  HGB 10.9*  HCT 35.2*  MCV 92.6  MCH 28.7  MCHC 31.0  RDW 14.7  PLT 220   Cardiac Enzymes Troponin: 205>pending BNP: not performed  DDimer bot performed   Radiology/Studies:  DG Chest Portable 1 View  Result Date: 09/02/2020 CLINICAL DATA:  Altered mental status.  Drug overdose. EXAM: PORTABLE CHEST 1 VIEW COMPARISON:  Chest x-ray 03/29/2017. FINDINGS: Mediastinum is normal. Cardiomegaly. No pulmonary venous congestion. Diffuse left lung infiltrate consistent with pneumonia. No pleural effusion or pneumothorax. No acute bony abnormality. IMPRESSION: Diffuse left lung infiltrate consistent with pneumonia. Electronically Signed   By: Maisie Fus  Register   On: 09/02/2020 11:15    Assessment and Plan:   1. Hypovolemic Shock vs Cardiogenic Shock: Patient presents with signs and symptoms initially concerning for septic shock with CXR and CTA showing evidence of multifocal pneumonia. Physical exam findings positive for cool and dry extremities. Echocardiogram revealed a new onset LVEF of 25-30% with global hypokinesis, Grade I diastolic dysfunction, and moderate MV/TV  regurgitation. IV was dilated with < 50% respiratory variability. Patient also has an AKI which collectively leads me to believe she is in hypovolemic shock. Patient was fluid repleted in the ED with improvement of  her kidney function and symptoms, but continues to have an elevated lactic acid form 2.6>3.2. Collectively, patient likely has hypovolemic shock, but it is difficult to r/o cardiogenic etiology without central access to assess CVP and CO.  - Trend lactic acid, if rising would recommend transfer to ICU for central access and monitoring CVP/Co-ox  2. Troponinemia: Patient presented with elevated troponin without significant risk factors for ischemic etiology and EKG shows no sign of ischemic changes. Troponin leak is likely secondary to increase myocardial demand in the setting of hypovolemic shock.   2. Multifocal pneumonia: Patient likely has underlying pneumonia and will need antibiotic therapy. Defer to primary team.   For questions or updates, please contact [facility] Please consult [email or web address] for contact info under Cardiology/STEMI.   Signed, Dellia Cloud, MD  09/02/2020 2:24 PM   Patient seen and examined.  Agree with above documentation.  Ms. Marxen is a 29 year old female with no significant past medical history who we are consulted to see at the request of Fayrene Helper, [place] for evaluation of elevated troponin.  She underwent a root canal yesterday and went to work afterwards.  Reports was having pain last night in her mouth so took a dose of Tylenol with codeine and went to bed.  This morning her sister reports she was unarousable.  Called EMS, who noted labored breathing, seem to improve with Narcan.  In the ED, vital signs notable for BP 84/49, pulse 133, SPO2 98% on 4 L, afebrile.  Labs notable for potassium 5.9, creatinine 1.96 (up from 0.76), AST 86, ALT 54, high-sensitivity troponin 205 >223, lactate 2.9 > 3.2, WBC 24.3, hemoglobin 10.9, platelets 220, CK 427.   EKG shows sinus tachycardia, rate 143.  Telemetry shows sinus rhythm, rate initially one forties, has improved to 90s.  On exam, patient is alert and oriented, regular rate and rhythm, no murmurs, lungs CTAB, no LE edema or JVD.  CTPA shows no PE, multifocal patchy airspace opacities that could represent multifocal pneumonia, pulmonary edema, or aspiration.  Echocardiogram shows LVEF 25 to 30%, global hypokinesis, grade 1 diastolic dysfunction, moderate RV systolic dysfunction, mild RV enlargement, moderate MR, moderate TR.  In regards to her shock, suspect septic/hypovolemic shock in setting of possible aspiration pneumonia (possibly due to seizure today).  Her EF is markedly reduced, but suspect stress cardiomyopathy in setting of septic shock.  She has improved with IV fluid resuscitation, as her hypotension, tachycardia, AKI have all improved with IV fluids.  Notably however, her lactate is rising.  There may be a component of cardiogenic shock to her picture.  Recommend checking another lactate, if it is rising then would favor transferring patient to ICU, obtaining central access, and checking CVP/Co-ox, as may need inotropic support.   Little Ishikawa, MD

## 2020-09-02 NOTE — Progress Notes (Signed)
Pharmacy Antibiotic Note  Tabitha Smith is a 29 y.o. female admitted on 09/02/2020 with pneumonia.  Pharmacy has been consulted for Levaquin dosing.  SCr elevated at 1.96 (BL 0.76). CrCl ~ 45 mL/min. WBC 24.3.   Plan: -Levaquin 750 mg IV Q 48 hours -Monitor CBC, renal fx, cultures and clinical progress     Temp (24hrs), Avg:98.2 F (36.8 C), Min:98.1 F (36.7 C), Max:98.2 F (36.8 C)  Recent Labs  Lab 09/02/20 1026  CREATININE 1.96*    CrCl cannot be calculated (Unknown ideal weight.).    Allergies  Allergen Reactions  . Amoxicillin Anaphylaxis  . Penicillins Anaphylaxis    Has patient had a PCN reaction causing immediate rash, facial/tongue/throat swelling, SOB or lightheadedness with hypotension: Yes Has patient had a PCN reaction causing severe rash involving mucus membranes or skin necrosis: Unk Has patient had a PCN reaction that required hospitalization: No Has patient had a PCN reaction occurring within the last 10 years: No If all of the above answers are "NO", then may proceed with Cephalosporin use.     Antimicrobials this admission: Levaquin 11/4 >>   Dose adjustments this admission: N/a   Microbiology results: 11/4 BCx:  11/4 UCx:    Thank you for allowing pharmacy to be a part of this patient's care.  Vinnie Level, PharmD., BCPS, BCCCP Clinical Pharmacist Please refer to [facility] for unit-specific pharmacist

## 2020-09-02 NOTE — Progress Notes (Addendum)
Pharmacy Antibiotic Note  Tabitha Smith is a 29 y.o. female admitted on 09/02/2020 with pneumonia.  Pharmacy has been consulted for Levaquin dosing. 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  WBC 24.3, afebrile; Scr has improved to 1.34, CrCl 68.8 ml/min  Plan: Change levofloxacin regimen to 750 mg IV Q 24 hrs Monitor WBC, temp, clinical improvement, cultures, renal function  Height: 5\' 6"  (167.6 cm) Weight: 90.1 kg (198 lb 10.2 oz) IBW/kg (Calculated) : 59.3  Temp (24hrs), Avg:98.3 F (36.8 C), Min:98.1 F (36.7 C), Max:98.5 F (36.9 C)  Recent Labs  Lab 09/02/20 1026 09/02/20 1220 09/02/20 1557 09/02/20 1835 09/02/20 1845  WBC  --  24.3*  --   --   --   CREATININE 1.96*  --  1.36* 1.34*  --   LATICACIDVEN  --  2.9* 3.2* 2.6* 2.7*    Estimated Creatinine Clearance: 68.8 mL/min (A) (by C-G formula based on SCr of 1.34 mg/dL (H)).    Allergies  Allergen Reactions  . Amoxicillin Anaphylaxis  . Penicillins Anaphylaxis    Has patient had a PCN reaction causing immediate rash, facial/tongue/throat swelling, SOB or lightheadedness with hypotension: Yes Has patient had a PCN reaction causing severe rash involving mucus membranes or skin necrosis: Unk Has patient had a PCN reaction that required hospitalization: No Has patient had a PCN reaction occurring within the last 10 years: No If all of the above answers are "NO", then may proceed with Cephalosporin use.     Antimicrobials this admission: Levaquin 11/4 >>   Microbiology results: 11/4 BCx X 2: pending 11/4 UCx: pending 114 COVID, flu A, flu B, HIV screen: negative  Thank you for allowing pharmacy to be a part of this patient's care.  13/4, PharmD, BCPS, [facility] Clinical Pharmacist

## 2020-09-03 ENCOUNTER — Observation Stay (HOSPITAL_COMMUNITY): Payer: Medicaid Other

## 2020-09-03 DIAGNOSIS — I428 Other cardiomyopathies: Secondary | ICD-10-CM

## 2020-09-03 DIAGNOSIS — I5021 Acute systolic (congestive) heart failure: Secondary | ICD-10-CM | POA: Diagnosis present

## 2020-09-03 DIAGNOSIS — T40601A Poisoning by unspecified narcotics, accidental (unintentional), initial encounter: Secondary | ICD-10-CM | POA: Diagnosis present

## 2020-09-03 DIAGNOSIS — I429 Cardiomyopathy, unspecified: Secondary | ICD-10-CM | POA: Diagnosis present

## 2020-09-03 DIAGNOSIS — Z20822 Contact with and (suspected) exposure to covid-19: Secondary | ICD-10-CM | POA: Diagnosis present

## 2020-09-03 DIAGNOSIS — K580 Irritable bowel syndrome with diarrhea: Secondary | ICD-10-CM | POA: Diagnosis present

## 2020-09-03 DIAGNOSIS — R32 Unspecified urinary incontinence: Secondary | ICD-10-CM | POA: Diagnosis present

## 2020-09-03 DIAGNOSIS — R6521 Severe sepsis with septic shock: Secondary | ICD-10-CM | POA: Diagnosis present

## 2020-09-03 DIAGNOSIS — R7401 Elevation of levels of liver transaminase levels: Secondary | ICD-10-CM | POA: Diagnosis present

## 2020-09-03 DIAGNOSIS — Z9151 Personal history of suicidal behavior: Secondary | ICD-10-CM | POA: Diagnosis not present

## 2020-09-03 DIAGNOSIS — F32A Depression, unspecified: Secondary | ICD-10-CM

## 2020-09-03 DIAGNOSIS — J690 Pneumonitis due to inhalation of food and vomit: Secondary | ICD-10-CM | POA: Diagnosis present

## 2020-09-03 DIAGNOSIS — A419 Sepsis, unspecified organism: Principal | ICD-10-CM

## 2020-09-03 DIAGNOSIS — N179 Acute kidney failure, unspecified: Secondary | ICD-10-CM | POA: Diagnosis present

## 2020-09-03 DIAGNOSIS — F17210 Nicotine dependence, cigarettes, uncomplicated: Secondary | ICD-10-CM | POA: Diagnosis present

## 2020-09-03 DIAGNOSIS — I313 Pericardial effusion (noninflammatory): Secondary | ICD-10-CM | POA: Diagnosis present

## 2020-09-03 DIAGNOSIS — I6340 Cerebral infarction due to embolism of unspecified cerebral artery: Secondary | ICD-10-CM | POA: Diagnosis not present

## 2020-09-03 DIAGNOSIS — R6520 Severe sepsis without septic shock: Secondary | ICD-10-CM | POA: Diagnosis not present

## 2020-09-03 DIAGNOSIS — D72829 Elevated white blood cell count, unspecified: Secondary | ICD-10-CM

## 2020-09-03 DIAGNOSIS — E872 Acidosis: Secondary | ICD-10-CM | POA: Diagnosis present

## 2020-09-03 DIAGNOSIS — I248 Other forms of acute ischemic heart disease: Secondary | ICD-10-CM

## 2020-09-03 DIAGNOSIS — R571 Hypovolemic shock: Secondary | ICD-10-CM | POA: Diagnosis present

## 2020-09-03 DIAGNOSIS — I5041 Acute combined systolic (congestive) and diastolic (congestive) heart failure: Secondary | ICD-10-CM | POA: Diagnosis not present

## 2020-09-03 DIAGNOSIS — Z6834 Body mass index (BMI) 34.0-34.9, adult: Secondary | ICD-10-CM | POA: Diagnosis not present

## 2020-09-03 DIAGNOSIS — R7989 Other specified abnormal findings of blood chemistry: Secondary | ICD-10-CM | POA: Diagnosis present

## 2020-09-03 DIAGNOSIS — I63412 Cerebral infarction due to embolism of left middle cerebral artery: Secondary | ICD-10-CM | POA: Diagnosis present

## 2020-09-03 DIAGNOSIS — I639 Cerebral infarction, unspecified: Secondary | ICD-10-CM | POA: Diagnosis not present

## 2020-09-03 DIAGNOSIS — I5181 Takotsubo syndrome: Secondary | ICD-10-CM | POA: Diagnosis present

## 2020-09-03 DIAGNOSIS — R0902 Hypoxemia: Secondary | ICD-10-CM | POA: Diagnosis present

## 2020-09-03 DIAGNOSIS — E669 Obesity, unspecified: Secondary | ICD-10-CM | POA: Diagnosis present

## 2020-09-03 DIAGNOSIS — R4182 Altered mental status, unspecified: Secondary | ICD-10-CM

## 2020-09-03 LAB — CBC WITH DIFFERENTIAL/PLATELET
Abs Immature Granulocytes: 0.1 10*3/uL — ABNORMAL HIGH (ref 0.00–0.07)
Basophils Absolute: 0 10*3/uL (ref 0.0–0.1)
Basophils Relative: 0 %
Eosinophils Absolute: 0 10*3/uL (ref 0.0–0.5)
Eosinophils Relative: 0 %
HCT: 30.4 % — ABNORMAL LOW (ref 36.0–46.0)
Hemoglobin: 9.7 g/dL — ABNORMAL LOW (ref 12.0–15.0)
Immature Granulocytes: 1 %
Lymphocytes Relative: 9 %
Lymphs Abs: 1.9 10*3/uL (ref 0.7–4.0)
MCH: 28.3 pg (ref 26.0–34.0)
MCHC: 31.9 g/dL (ref 30.0–36.0)
MCV: 88.6 fL (ref 80.0–100.0)
Monocytes Absolute: 1.6 10*3/uL — ABNORMAL HIGH (ref 0.1–1.0)
Monocytes Relative: 7 %
Neutro Abs: 18.5 10*3/uL — ABNORMAL HIGH (ref 1.7–7.7)
Neutrophils Relative %: 83 %
Platelets: 204 10*3/uL (ref 150–400)
RBC: 3.43 MIL/uL — ABNORMAL LOW (ref 3.87–5.11)
RDW: 14.5 % (ref 11.5–15.5)
WBC: 22.1 10*3/uL — ABNORMAL HIGH (ref 4.0–10.5)
nRBC: 0 % (ref 0.0–0.2)

## 2020-09-03 LAB — COMPREHENSIVE METABOLIC PANEL
ALT: 56 U/L — ABNORMAL HIGH (ref 0–44)
AST: 70 U/L — ABNORMAL HIGH (ref 15–41)
Albumin: 3 g/dL — ABNORMAL LOW (ref 3.5–5.0)
Alkaline Phosphatase: 44 U/L (ref 38–126)
Anion gap: 8 (ref 5–15)
BUN: 14 mg/dL (ref 6–20)
CO2: 24 mmol/L (ref 22–32)
Calcium: 8.3 mg/dL — ABNORMAL LOW (ref 8.9–10.3)
Chloride: 103 mmol/L (ref 98–111)
Creatinine, Ser: 1.06 mg/dL — ABNORMAL HIGH (ref 0.44–1.00)
GFR, Estimated: >60 mL/min (ref >60)
Glucose, Bld: 85 mg/dL (ref 70–99)
Potassium: 4.2 mmol/L (ref 3.5–5.1)
Sodium: 135 mmol/L (ref 135–145)
Total Bilirubin: 0.3 mg/dL (ref 0.3–1.2)
Total Protein: 5.5 g/dL — ABNORMAL LOW (ref 6.5–8.1)

## 2020-09-03 LAB — URINE CULTURE: Culture: <10000 — AB

## 2020-09-03 LAB — LIPID PANEL
Cholesterol: 95 mg/dL (ref 0–200)
HDL: 39 mg/dL — ABNORMAL LOW (ref >40)
LDL Cholesterol: 49 mg/dL (ref 0–99)
Total CHOL/HDL Ratio: 2.4 RATIO
Triglycerides: 35 mg/dL (ref <150)
VLDL: 7 mg/dL (ref 0–40)

## 2020-09-03 LAB — HEMOGLOBIN A1C
Hgb A1c MFr Bld: 5.8 % — ABNORMAL HIGH (ref 4.8–5.6)
Mean Plasma Glucose: 119.76 mg/dL

## 2020-09-03 LAB — LACTIC ACID, PLASMA: Lactic Acid, Venous: 1.3 mmol/L (ref 0.5–1.9)

## 2020-09-03 LAB — ANTITHROMBIN III: AntiThromb III Func: 102 % (ref 75–120)

## 2020-09-03 MED ORDER — ONDANSETRON HCL 4 MG/2ML IJ SOLN
4.0000 mg | Freq: Three times a day (TID) | INTRAMUSCULAR | Status: DC | PRN
  Administered 2020-09-03: 4 mg via INTRAVENOUS
  Filled 2020-09-03: qty 2

## 2020-09-03 MED ORDER — ACETAMINOPHEN 10 MG/ML IV SOLN
1000.0000 mg | Freq: Once | INTRAVENOUS | Status: AC
  Administered 2020-09-03: 1000 mg via INTRAVENOUS
  Filled 2020-09-03: qty 100

## 2020-09-03 MED ORDER — ORAL CARE MOUTH RINSE
15.0000 mL | Freq: Two times a day (BID) | OROMUCOSAL | Status: DC
  Administered 2020-09-04 – 2020-09-06 (×6): 15 mL via OROMUCOSAL

## 2020-09-03 MED ORDER — ASPIRIN EC 81 MG PO TBEC
81.0000 mg | DELAYED_RELEASE_TABLET | Freq: Every day | ORAL | Status: DC
  Administered 2020-09-03 – 2020-09-07 (×5): 81 mg via ORAL
  Filled 2020-09-03 (×5): qty 1

## 2020-09-03 MED ORDER — IOHEXOL 350 MG/ML SOLN
75.0000 mL | Freq: Once | INTRAVENOUS | Status: AC | PRN
  Administered 2020-09-03: 75 mL via INTRAVENOUS

## 2020-09-03 MED ORDER — ACETAMINOPHEN 500 MG PO TABS
500.0000 mg | ORAL_TABLET | Freq: Once | ORAL | Status: AC
  Administered 2020-09-03: 500 mg via ORAL
  Filled 2020-09-03: qty 1

## 2020-09-03 NOTE — Progress Notes (Signed)
EEG complete - results pending 

## 2020-09-03 NOTE — Progress Notes (Signed)
MD on call paged 6/10 headache with nausea vitals WNL neuro assessment normal patient is NPO

## 2020-09-03 NOTE — Hospital Course (Signed)
Recommendations for Follow up:  Discontinued migraine medications per neurology recommendations

## 2020-09-03 NOTE — Progress Notes (Signed)
PT Cancellation Note  Patient Details Name: Tabitha Smith MRN: [id number] DOB: July 09, 1991   Cancelled Treatment:    Reason Eval/Treat Not Completed: Patient at procedure or test/unavailable (with transport for CT). Will follow-up for PT Evaluation as schedule permits.  Ina Homes, PT, DPT Acute Rehabilitation Services  Pager [phone] Office [phone]  Malachy Chamber 09/03/2020, 8:41 AM

## 2020-09-03 NOTE — Progress Notes (Signed)
Pt has an order to for transfer to a progressive care unit, followed up with bed placement but there's no bed available rt now. Dr Leone Haven aware.

## 2020-09-03 NOTE — Progress Notes (Signed)
INTERIM PROGRESS NOTE  Appreciated Dr. Shelbie Hutching recommendations for this patient. Fasting Lipid panel, A1c, and Hypercoag panel ordered.   Ordered Aspirin 81mg  daily Plan to follow up PT/OT/SLP recs Cardiac telemetry already ordered Continue frequent Neuro checks Plan to follow Cr re potential for CTA Head/neck  Plan for Day team  -to order TEE 11/5 -D/c meds Aimovig and Rimegepant    13/5, DO [facility] Family Medicine, PGY-3 09/03/2020 2:45 AM

## 2020-09-03 NOTE — Plan of Care (Signed)
  Problem: Activity: Goal: Ability to tolerate increased activity will improve Outcome: Progressing   Problem: Clinical Measurements: Goal: Ability to maintain a body temperature in the normal range will improve Outcome: Progressing   Problem: Respiratory: Goal: Ability to maintain adequate ventilation will improve Outcome: Progressing Goal: Ability to maintain a clear airway will improve Outcome: Progressing   

## 2020-09-03 NOTE — Progress Notes (Signed)
OT Cancellation Note  Patient Details Name: Tabitha Smith MRN: [id number] DOB: [birth year]   Cancelled Treatment:     Pt. Was at testing in AM. Pt having procedure in room in PM. Unable to see.   Cherrise Occhipinti 09/03/2020, 12:39 PM

## 2020-09-03 NOTE — Evaluation (Addendum)
Physical Therapy Evaluation Patient Details Name: Tabitha Smith MRN: [id number] DOB: 07/23/1991 Today's Date: 09/03/2020   History of Present Illness  Pt is a 29 y.o. female admitted 09/02/20 with seizure-like activity and unresponsiveness at home. Found to be septic with PNA. 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 Potential for hypovolemic shock. PMH includes migraine, sinusitis.  Clinical Impression   Pt presents with impaired dynamic standing balance and decreased activity tolerance vs baseline. No focal neurologic deficits present s/p CVA, LE strength WFL, sensation intact, and no visual changes per pt report. Pt to benefit from acute PT to address deficits. Pt ambulated hallway distance with close guard for safety, pt moving very slowly with mild weaving of gait noted but no overt LOB. Pt states she can have 24/7 family support upon d/c, PT expects pt to progress well with mobility and no follow up PT currently warranted. PT to progress mobility as tolerated, and will continue to follow acutely.    SpO2 89-93% immediately post-ambulation on RA, HR max observed 103 bpm     Follow Up Recommendations Supervision/Assistance - 24 hour;No PT follow up    Equipment Recommendations  None recommended by PT    Recommendations for Other Services       Precautions / Restrictions Precautions Precautions: Fall Restrictions Weight Bearing Restrictions: No      Mobility  Bed Mobility Overal bed mobility: Needs Assistance Bed Mobility: Supine to Sit     Supine to sit: Supervision     General bed mobility comments: for safety, very increased time    Transfers Overall transfer level: Needs assistance   Transfers: Sit to/from Stand Sit to Stand: Supervision;From elevated surface         General transfer comment: for safety, increased time to rise and steady  Ambulation/Gait Ambulation/Gait assistance: Min guard Gait Distance  (Feet): 100 Feet Assistive device: None Gait Pattern/deviations: Step-through pattern;Decreased stride length;Drifts right/left Gait velocity: decr   General Gait Details: Close guard for pt safety, pt weaving L/R and when PT asked if pt aware of this pt states no. Very slowed gait speed for pt age  Stairs Stairs: Yes Stairs assistance: Min guard Stair Management: One rail Left;Forwards;Alternating pattern;Step to pattern Number of Stairs: 5    Wheelchair Mobility    Modified Rankin (Stroke Patients Only) Modified Rankin (Stroke Patients Only) Pre-Morbid Rankin Score: No symptoms Modified Rankin: Moderate disability     Balance Overall balance assessment: Needs assistance Sitting-balance support: Feet unsupported;No upper extremity supported Sitting balance-Leahy Scale: Normal     Standing balance support: No upper extremity supported;During functional activity Standing balance-Leahy Scale: Good Standing balance comment: weaving of gait, accepts challenge but limited                             Pertinent Vitals/Pain Pain Assessment: Faces Faces Pain Scale: Hurts little more Pain Location: head, chest Pain Descriptors / Indicators: Headache;Other (Comment) ("feels like after you get out of a pool and your chest feels tight") Pain Intervention(s): Limited activity within patient's tolerance;Monitored during session    Home Living Family/patient expects to be discharged to:: Private residence Living Arrangements: Children;Other relatives (sister, nephews) Available Help at Discharge: Family Type of Home: House Home Access: Stairs to enter   Secretary/administrator of Steps: 5 Home Layout: One level Home Equipment: None      Prior Function Level of Independence: Independent  Comments: 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 [employer]   Dominant Hand: Left    Extremity/Trunk Assessment   Upper Extremity  Assessment Upper Extremity Assessment: Defer to OT evaluation    Lower Extremity Assessment Lower Extremity Assessment: Overall WFL for tasks assessed (no focal deficits, heel to shin WFL)    Cervical / Trunk Assessment Cervical / Trunk Assessment: Normal  Communication   Communication: No difficulties  Cognition Arousal/Alertness: Awake/alert Behavior During Therapy: WFL for tasks assessed/performed Overall Cognitive Status: Within Functional Limits for tasks assessed                                 General Comments: increased processing time to respond to questions, pt makes jokes during session      General Comments General comments (skin integrity, edema, etc.): SpO2 89-93% during mobility, pt reporting dyspnea and SpO2 immediately post-ambulation 89% on RA. HR 70s-103 bpm    Exercises     Assessment/Plan    PT Assessment Patient needs continued PT services  PT Problem List Decreased strength;Decreased mobility;Decreased activity tolerance;Decreased balance;Decreased knowledge of use of DME;Pain;Cardiopulmonary status limiting activity;Decreased knowledge of precautions;Decreased safety awareness       PT Treatment Interventions DME instruction;Therapeutic activities;Gait training;Patient/family education;Therapeutic exercise;Balance training;Stair training;Functional mobility training;Neuromuscular re-education    PT Goals (Current goals can be found in the Care Plan section)  Acute Rehab PT Goals Patient Stated Goal: go home PT Goal Formulation: With patient Time For Goal Achievement: 09/17/20 Potential to Achieve Goals: Good    Frequency Min 3X/week   Barriers to discharge        Co-evaluation               AM-PAC PT "6 Clicks" Mobility  Outcome Measure Help needed turning from your back to your side while in a flat bed without using bedrails?: None Help needed moving from lying on your back to sitting on the side of a flat bed without  using bedrails?: None Help needed moving to and from a bed to a chair (including a wheelchair)?: None Help needed standing up from a chair using your arms (e.g., wheelchair or bedside chair)?: None Help needed to walk in hospital room?: A Little Help needed climbing 3-5 steps with a railing? : A Little 6 Click Score: 22    End of Session   Activity Tolerance: Patient tolerated treatment well;Patient limited by fatigue Patient left: in bed;with call bell/phone within reach;with bed alarm set Nurse Communication: Mobility status PT Visit Diagnosis: Other abnormalities of gait and mobility (R26.89);Difficulty in walking, not elsewhere classified (R26.2)    Time: 1440-1500 PT Time Calculation (min) (ACUTE ONLY): 20 min   Charges:   PT Evaluation $PT Eval Low Complexity: 1 Low          Mycah Formica E, PT Acute Rehabilitation Services Pager [phone]  Office [phone]    Genieve Ramaswamy D Despina Hidden 09/03/2020, 4:08 PM

## 2020-09-03 NOTE — Procedures (Signed)
Patient Name: Tabitha Smith  MRN: [id number]  Epilepsy Attending: Charlsie Quest  Referring Physician/Provider: Dr Genia Hotter Date: 09/03/2020 Duration: 27.24 mins  Patient history: 29 y.o. female with AMS. 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 EEG to evaluate for seizure.  Level of alertness: Awake, asleep  AEDs during EEG study: None  Technical aspects: This EEG study was done with scalp electrodes positioned according to the 10-20 International system of electrode placement. Electrical activity was acquired at a sampling rate of 500Hz  and reviewed with a high frequency filter of 70Hz  and a low frequency filter of 1Hz . EEG data were recorded continuously and digitally stored.   Description: The posterior dominant rhythm consists of 8-9 Hz activity of moderate voltage (25-35 uV) seen predominantly in posterior head regions, symmetric and reactive to eye opening and eye closing. Sleep was characterized by vertex waves, sleep spindles (12 to 14 Hz), maximal frontocentral region.   Hyperventilation and photic stimulation were not performed.     IMPRESSION: This study is within normal limits. No seizures or epileptiform discharges were seen throughout the recording.  Tabitha Smith 

## 2020-09-03 NOTE — Progress Notes (Signed)
Interim progress note  Received text page from LPN Suzette Battiest that patient is having 6/10 headache as well as nausea, vitals and neurological exam are normal.  Patient remains n.p.o.  Due to recent AKI and patient's current n.p.o. status ordered one-time dose of IV Tylenol 1000 mg for the patient.  Also ordered IV Zofran 4 mg every 8 as needed.  All floor pages should be directed to the [phone] pager number for our team, can also send text pages via [email or web address] to the [phone] pager, use password "[email or web address]".  Peggyann Shoals, DO [facility], PGY-3 09/03/2020 5:24 AM

## 2020-09-03 NOTE — Evaluation (Signed)
Clinical/Bedside Swallow Evaluation Patient Details  Name: Tabitha Smith MRN: [id number] Date of Birth: [birth year]  Today's Date: 09/03/2020 Time: SLP Start Time (ACUTE ONLY): 1336 SLP Stop Time (ACUTE ONLY): 1346 SLP Time Calculation (min) (ACUTE ONLY): 10 min  Past Medical History:  Past Medical History:  Diagnosis Date  . Migraine   . Sinusitis    Past Surgical History:  Past Surgical History:  Procedure Laterality Date  . NO PAST SURGERIES    . stitches to abdomen      for a cut    HPI:  29 y.o. female with PMH of migraine, depression, and insomnia.patient initially presented with AMS after being found down at home. Given narcan in the field with some improvement in symptoms. There was some c/f seizure activity given incontinence and tongue bite at home. Patient underwent MRI to evaluate AMS and possible seizure activity which revealed small acute infarcts in the left occipitotemporal and left cerebellum regions. Neurology consulted and feel presentation could be suspicious for prolonged hypoxia in the setting of opiate overdose possible precipitating seizure activity, though in the setting of LV dysfunction, cannot exclude cardioembolic event.    Assessment / Plan / Recommendation Clinical Impression  Pt demonstrates no signs of aspiration with PO, was able to drink 3 oz consecutively without cough. Pt did report sensory change to the left side of her tongue. This is not consistent with imaging (Left sided infacts) but pt was noted to have a wound on the left side of her tongue which may account for her sensory change. She was able to [facility] but reported she was worried she couldnt feel if she had masticated thoroughly. She could not masticate on the right side of her teeth because she just had a root canal there. Will initaite a dys 2 diet for now with expectation to upgrade per patient wishes.  SLP Visit Diagnosis: Dysphagia, oral phase (R13.11)    Aspiration Risk  Mild  aspiration risk    Diet Recommendation Dysphagia 2 (Fine chop);Thin liquid   Liquid Administration via: Cup;Straw Medication Administration: Whole meds with liquid Supervision: Patient able to self feed Postural Changes: Seated upright at 90 degrees    Other  Recommendations Oral Care Recommendations: Oral care BID   Follow up Recommendations None      Frequency and Duration min 2x/week          Prognosis        Swallow Study   General HPI: 29 y.o. female with PMH of migraine, depression, and insomnia.patient initially presented with AMS after being found down at home. Given narcan in the field with some improvement in symptoms. There was some c/f seizure activity given incontinence and tongue bite at home. Patient underwent MRI to evaluate AMS and possible seizure activity which revealed small acute infarcts in the left occipitotemporal and left cerebellum regions. Neurology consulted and feel presentation could be suspicious for prolonged hypoxia in the setting of opiate overdose possible precipitating seizure activity, though in the setting of LV dysfunction, cannot exclude cardioembolic event.  Type of Study: Bedside Swallow Evaluation Diet Prior to this Study: NPO Respiratory Status: Room air History of Recent Intubation: No Behavior/Cognition: Alert;Cooperative Oral Cavity Assessment: Lesions (lesion on left side of tongue) Self-Feeding Abilities: Able to feed self Patient Positioning: Upright in bed Baseline Vocal Quality: Normal Volitional Cough: Strong    Oral/Motor/Sensory Function Overall Oral Motor/Sensory Function: Mild impairment Facial ROM: Within Functional Limits Facial Symmetry: Within Functional Limits Facial Strength: Within Functional Limits Facial Sensation:  Within Functional Limits Lingual ROM: Within Functional Limits Lingual Symmetry: Abnormal symmetry left Lingual Strength: Within Functional Limits Lingual Sensation: Reduced   Ice Chips     Thin  Liquid Thin Liquid: Within functional limits    Nectar Thick Nectar Thick Liquid: Not tested   Honey Thick Honey Thick Liquid: Not tested   Puree Puree: Within functional limits   Solid     Solid: Within functional limits     Harlon Ditty, MA CCC-SLP  Acute Rehabilitation Services Pager [phone] Office [phone]  Claudine Mouton 09/03/2020,2:04 PM

## 2020-09-03 NOTE — Progress Notes (Addendum)
Progress Note  Patient Name: Tabitha Smith Date of Encounter: 09/03/2020  [facility] Cardiologist: Little Ishikawa, MD   Subjective   Feels chest pressure which has been constant since arrival to the ED. Breathing is overall stable. Still with some HA. Seems somnolent. No complaints of palpitations or LE edema.   Inpatient Medications    Scheduled Meds: . aspirin EC  81 mg Oral Daily  . DULoxetine  60 mg Oral Daily  . enoxaparin (LOVENOX) injection  40 mg Subcutaneous Q24H  . prazosin  1 mg Oral BID  . QUEtiapine  50 mg Oral QHS   Continuous Infusions: . levofloxacin (LEVAQUIN) IV     PRN Meds: ondansetron (ZOFRAN) IV   Vital Signs    Vitals:   09/02/20 1550 09/02/20 2057 09/03/20 0013 09/03/20 0449  BP: 91/66 98/71 110/71 121/86  Pulse: 97 81 80 65  Resp: 19 18  18   Temp: 98.4 F (36.9 C) 98.5 F (36.9 C) 98.4 F (36.9 C) 99 F (37.2 C)  TempSrc: Oral Oral Oral Oral  SpO2: 93% 100% 100% 99%  Weight:  90.1 kg    Height:  5\' 6"  (1.676 m)      Intake/Output Summary (Last 24 hours) at 09/03/2020 1034 Last data filed at 09/03/2020 0138 Gross per 24 hour  Intake --  Output 200 ml  Net -200 ml   Last 3 Weights 09/02/2020 06/14/2020 12/16/2019  Weight (lbs) 198 lb 10.2 oz 185 lb 151 lb  Weight (kg) 90.1 kg 83.915 kg 68.493 kg      Telemetry    Sinus rhythm - Personally Reviewed  ECG    No new tracings - Personally Reviewed  Physical Exam   GEN: No acute distress, somewhat somnolent.   Neck: No JVD Cardiac: RRR, no murmurs, rubs, or gallops.  Respiratory: Clear to auscultation bilaterally. GI: Soft, nontender, non-distended  MS: No edema; No deformity. Neuro:  Nonfocal  Psych: Normal affect   Labs    High Sensitivity Troponin:   Recent Labs  Lab 09/02/20 1026 09/02/20 1557  TROPONINIHS 205* 223*      Chemistry Recent Labs  Lab 09/02/20 1557 09/02/20 1835 09/03/20 0212  NA 134* 135 135  K 5.2* 4.5 4.2  CL 103 104 103  CO2  22 22 24   GLUCOSE 139* 123* 85  BUN 17 15 14   CREATININE 1.36* 1.34* 1.06*  CALCIUM 8.1* 8.2* 8.3*  PROT 5.4* 6.1* 5.5*  ALBUMIN 3.1* 3.1* 3.0*  AST 90* 93* 70*  ALT 62* 66* 56*  ALKPHOS 51 48 44  BILITOT 0.3 0.5 0.3  GFRNONAA 53* 54* >60  ANIONGAP 9 9 8      Hematology Recent Labs  Lab 09/02/20 1220 09/03/20 0212  WBC 24.3* 22.1*  RBC 3.80* 3.43*  HGB 10.9* 9.7*  HCT 35.2* 30.4*  MCV 92.6 88.6  MCH 28.7 28.3  MCHC 31.0 31.9  RDW 14.7 14.5  PLT 220 204    BNPNo results for input(s): BNP, PROBNP in the last 168 hours.   DDimer No results for input(s): DDIMER in the last 168 hours.   Radiology    CT ANGIO HEAD W OR WO CONTRAST  Result Date: 09/03/2020 CLINICAL DATA:  Acute left cerebellar and left temporo-occipital infarcts on MRI. EXAM: CT ANGIOGRAPHY HEAD AND NECK TECHNIQUE: Multidetector CT imaging of the head and neck was performed using the standard protocol during bolus administration of intravenous contrast. Multiplanar CT image reconstructions and MIPs were obtained to evaluate the vascular anatomy.  Carotid stenosis measurements (when applicable) are obtained utilizing NASCET criteria, using the distal internal carotid diameter as the denominator. CONTRAST:  58mL OMNIPAQUE IOHEXOL 350 MG/ML SOLN COMPARISON:  Chest CTA 09/02/2020. FINDINGS: CTA NECK FINDINGS Aortic arch: Normal variant aortic arch branching pattern with common origin of the brachiocephalic and left common carotid arteries. Widely patent arch vessel origins. Right carotid system: Patent without evidence of stenosis, dissection, or significant atherosclerosis. Left carotid system: Patent without evidence of stenosis, dissection, or significant atherosclerosis. Vertebral arteries: Patent without evidence of stenosis, dissection, or significant atherosclerosis. Codominant. Skeleton: No acute osseous abnormality or suspicious osseous lesion. Other neck: No evidence of cervical lymphadenopathy or mass. Upper  chest: Patchy ground-glass opacities in the visualized portions of the left upper and left lower lobes, similar in appearance to yesterday's chest CTA. Small bilateral pleural effusions. Review of the MIP images confirms the above findings CTA HEAD FINDINGS Anterior circulation: The internal carotid arteries are widely patent from skull base to carotid termini. ACAs and MCAs are patent without evidence of a proximal branch occlusion or significant proximal stenosis. No aneurysm is identified. Posterior circulation: The intracranial vertebral arteries are widely patent to the basilar. The left PICA and right AICA appear dominant. Patent SCA is are seen bilaterally. The basilar artery is widely patent. There are left larger than right posterior communicating arteries. Both PCAs are patent without evidence of a significant proximal stenosis. No aneurysm is identified. Venous sinuses: Patent. Anatomic variants: None. Review of the MIP images confirms the above findings IMPRESSION: 1. No large vessel occlusion or significant stenosis in the head and neck. 2. Patchy ground-glass opacities in the left lung, more fully evaluated on yesterday's chest CTA. Small bilateral pleural effusions. Electronically Signed   By: Sebastian Ache M.D.   On: 09/03/2020 10:24   CT ANGIO NECK W OR WO CONTRAST  Result Date: 09/03/2020 CLINICAL DATA:  Acute left cerebellar and left temporo-occipital infarcts on MRI. EXAM: CT ANGIOGRAPHY HEAD AND NECK TECHNIQUE: Multidetector CT imaging of the head and neck was performed using the standard protocol during bolus administration of intravenous contrast. Multiplanar CT image reconstructions and MIPs were obtained to evaluate the vascular anatomy. Carotid stenosis measurements (when applicable) are obtained utilizing NASCET criteria, using the distal internal carotid diameter as the denominator. CONTRAST:  38mL OMNIPAQUE IOHEXOL 350 MG/ML SOLN COMPARISON:  Chest CTA 09/02/2020. FINDINGS: CTA NECK  FINDINGS Aortic arch: Normal variant aortic arch branching pattern with common origin of the brachiocephalic and left common carotid arteries. Widely patent arch vessel origins. Right carotid system: Patent without evidence of stenosis, dissection, or significant atherosclerosis. Left carotid system: Patent without evidence of stenosis, dissection, or significant atherosclerosis. Vertebral arteries: Patent without evidence of stenosis, dissection, or significant atherosclerosis. Codominant. Skeleton: No acute osseous abnormality or suspicious osseous lesion. Other neck: No evidence of cervical lymphadenopathy or mass. Upper chest: Patchy ground-glass opacities in the visualized portions of the left upper and left lower lobes, similar in appearance to yesterday's chest CTA. Small bilateral pleural effusions. Review of the MIP images confirms the above findings CTA HEAD FINDINGS Anterior circulation: The internal carotid arteries are widely patent from skull base to carotid termini. ACAs and MCAs are patent without evidence of a proximal branch occlusion or significant proximal stenosis. No aneurysm is identified. Posterior circulation: The intracranial vertebral arteries are widely patent to the basilar. The left PICA and right AICA appear dominant. Patent SCA is are seen bilaterally. The basilar artery is widely patent. There are left larger than  right posterior communicating arteries. Both PCAs are patent without evidence of a significant proximal stenosis. No aneurysm is identified. Venous sinuses: Patent. Anatomic variants: None. Review of the MIP images confirms the above findings IMPRESSION: 1. No large vessel occlusion or significant stenosis in the head and neck. 2. Patchy ground-glass opacities in the left lung, more fully evaluated on yesterday's chest CTA. Small bilateral pleural effusions. Electronically Signed   By: Sebastian Ache M.D.   On: 09/03/2020 10:24   CT Angio Chest PE W and/or Wo  Contrast  Result Date: 09/02/2020 CLINICAL DATA:  Found unresponsive EXAM: CT ANGIOGRAPHY CHEST WITH CONTRAST TECHNIQUE: Multidetector CT imaging of the chest was performed using the standard protocol during bolus administration of intravenous contrast. Multiplanar CT image reconstructions and MIPs were obtained to evaluate the vascular anatomy. CONTRAST:  78mL OMNIPAQUE IOHEXOL 350 MG/ML SOLN COMPARISON:  None. FINDINGS: Cardiovascular: There is a optimal opacification of the pulmonary arteries. There is no central,segmental, or subsegmental filling defects within the pulmonary arteries. The heart is normal in size. No pericardial effusion or thickening. No evidence right heart strain. There is normal three-vessel brachiocephalic anatomy without proximal stenosis. The thoracic aorta is normal in appearance. Mediastinum/Nodes: No hilar, mediastinal, or axillary adenopathy. Thyroid gland, trachea, and esophagus demonstrate no significant findings. Lungs/Pleura: Patchy tree-in-bud opacities are seen within the left upper lung and posterior left lung base. There is a small amount tree-in-bud patchy airspace opacities in the posterior left lung base. No pleural effusion or pneumothorax. Upper Abdomen: No acute abnormalities present in the visualized portions of the upper abdomen. Musculoskeletal: No chest wall abnormality. No acute or significant osseous findings. Review of the MIP images confirms the above findings. IMPRESSION: No central, segmental or subsegmental pulmonary embolism. Multifocal patchy airspace opacities which could be due to multifocal pneumonia pulmonary edema, or aspiration. Electronically Signed   By: Jonna Clark M.D.   On: 09/02/2020 15:58   MR BRAIN W WO CONTRAST  Addendum Date: 09/02/2020   ADDENDUM REPORT: 09/02/2020 22:12 ADDENDUM: These results will be called to the ordering clinician or representative by the Radiologist Assistant, and communication documented in the PACS or [employer]. Electronically Signed   By: Stana Bunting M.D.   On: 09/02/2020 22:12   Result Date: 09/02/2020 CLINICAL DATA:  Mental status change, unknown cause EXAM: MRI HEAD WITHOUT AND WITH CONTRAST TECHNIQUE: Multiplanar, multiecho pulse sequences of the brain and surrounding structures were obtained without and with intravenous contrast. CONTRAST:  8.91mL GADAVIST GADOBUTROL 1 MMOL/ML IV SOLN COMPARISON:  05/22/2019 head CT.  08/24/2018 MRI head. FINDINGS: Brain: Multifocal acute infarcts involving the left occipitotemporal region and left cerebellum. No intracranial hemorrhage. No midline shift, ventriculomegaly or extra-axial fluid collection. No mass lesion. No abnormal enhancement. Cerebral volume is within normal limits. Vascular: Normal flow voids. Skull and upper cervical spine: Normal marrow signal. Sinuses/Orbits: Normal orbits. Right maxillary and sphenoid sinus mucous retention cysts. Clear mastoid air cells. Other: Please note that image quality is mildly degraded by motion artifact. 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 Electronically Signed: By: Stana Bunting M.D. On: 09/02/2020 21:44   DG Chest Portable 1 View  Result Date: 09/02/2020 CLINICAL DATA:  Altered mental status.  Drug overdose. EXAM: PORTABLE CHEST 1 VIEW COMPARISON:  Chest x-ray 03/29/2017. FINDINGS: Mediastinum is normal. Cardiomegaly. No pulmonary venous congestion. Diffuse left lung infiltrate consistent with pneumonia. No pleural effusion or pneumothorax. No acute bony abnormality. IMPRESSION: Diffuse left lung infiltrate consistent with  pneumonia. Electronically Signed   By: Maisie Fus  Register   On: 09/02/2020 11:15   ECHOCARDIOGRAM COMPLETE  Result Date: 09/02/2020    ECHOCARDIOGRAM LIMITED REPORT   Patient Name:   [facility] Date of Exam: 09/02/2020 Medical Rec #:  [id number]       Height:       66.0 in Accession #:    [id number]      Weight:        185.0 lb Date of Birth:  July 03, 1991       BSA:          1.935 m Patient Age:    29 years        BP:           101/76 mmHg Patient Gender: F               HR:           73 bpm. Exam Location:  Inpatient Procedure: 2D Echo Indications:    AMS, loss of conciousness, sepsis  History:        Patient has no prior history of Echocardiogram examinations.                 CHF.  Sonographer:    Thurman Coyer RDCS (AE) Referring Phys: [id number] Estevan Ryder MCINTYRE IMPRESSIONS  1. Left ventricular ejection fraction, by estimation, is 25 to 30%. The left ventricle has severely decreased function. The left ventricle demonstrates global hypokinesis. Left ventricular diastolic parameters are consistent with Grade I diastolic dysfunction (impaired relaxation).  2. Right ventricular systolic function is moderately reduced. The right ventricular size is mildly enlarged. There is normal pulmonary artery systolic pressure. The estimated right ventricular systolic pressure is 29.0 mmHg.  3. The pericardial effusion is posterior to the left ventricle. There is no evidence of cardiac tamponade.  4. The mitral valve is normal in structure. Moderate mitral valve regurgitation. No evidence of mitral stenosis.  5. Tricuspid valve regurgitation is moderate.  6. The aortic valve is normal in structure. Aortic valve regurgitation is not visualized. No aortic stenosis is present.  7. The inferior vena cava is dilated in size with <50% respiratory variability, suggesting right atrial pressure of 15 mmHg. FINDINGS  Left Ventricle: Left ventricular ejection fraction, by estimation, is 25 to 30%. The left ventricle has severely decreased function. The left ventricle demonstrates global hypokinesis. The left ventricular internal cavity size was normal in size. There is no left ventricular hypertrophy. Left ventricular diastolic parameters are consistent with Grade I diastolic dysfunction (impaired relaxation). Normal left ventricular filling pressure.  Right Ventricle: The right ventricular size is mildly enlarged. No increase in right ventricular wall thickness. Right ventricular systolic function is moderately reduced. There is normal pulmonary artery systolic pressure. The tricuspid regurgitant velocity is 1.87 m/s, and with an assumed right atrial pressure of 15 mmHg, the estimated right ventricular systolic pressure is 29.0 mmHg. Left Atrium: Left atrial size was normal in size. Right Atrium: Right atrial size was normal in size. Pericardium: Trivial pericardial effusion is present. The pericardial effusion is posterior to the left ventricle. There is no evidence of cardiac tamponade. Mitral Valve: The mitral valve is normal in structure. Moderate mitral valve regurgitation. No evidence of mitral valve stenosis. Tricuspid Valve: The tricuspid valve is normal in structure. Tricuspid valve regurgitation is moderate . No evidence of tricuspid stenosis. Aortic Valve: The aortic valve is normal in structure. Aortic valve regurgitation is not visualized. No aortic stenosis is present. Pulmonic Valve:  The pulmonic valve was normal in structure. Pulmonic valve regurgitation is trivial. No evidence of pulmonic stenosis. Aorta: The aortic root is normal in size and structure. Venous: The inferior vena cava is dilated in size with less than 50% respiratory variability, suggesting right atrial pressure of 15 mmHg. IAS/Shunts: No atrial level shunt detected by color flow Doppler. LEFT VENTRICLE PLAX 2D LVIDd:         5.11 cm      Diastology LVIDs:         4.15 cm      LV e' medial:    6.83 cm/s LV PW:         0.80 cm      LV E/e' medial:  6.2 LV IVS:        0.95 cm      LV e' lateral:   8.67 cm/s LVOT diam:     2.40 cm      LV E/e' lateral: 4.9 LV SV:         53 LV SV Index:   28 LVOT Area:     4.52 cm  LV Volumes (MOD) LV vol d, MOD A2C: 106.0 ml LV vol d, MOD A4C: 83.4 ml LV vol s, MOD A2C: 65.9 ml LV vol s, MOD A4C: 45.5 ml LV SV MOD A2C:     40.1 ml LV SV MOD A4C:      83.4 ml LV SV MOD BP:      40.7 ml RIGHT VENTRICLE RV S prime:     8.67 cm/s TAPSE (M-mode): 1.2 cm LEFT ATRIUM             Index       RIGHT ATRIUM           Index LA diam:        3.10 cm 1.60 cm/m  RA Area:     13.60 cm LA Vol (A2C):   50.0 ml 25.84 ml/m RA Volume:   33.60 ml  17.37 ml/m LA Vol (A4C):   25.0 ml 12.92 ml/m LA Biplane Vol: 35.9 ml 18.56 ml/m  AORTIC VALVE LVOT Vmax:   60.60 cm/s LVOT Vmean:  41.900 cm/s LVOT VTI:    0.118 m  AORTA Ao Root diam: 3.00 cm MITRAL VALVE                 TRICUSPID VALVE MV Area (PHT): 4.31 cm      TR Peak grad:   14.0 mmHg MV Decel Time: 176 msec      TR Vmax:        187.00 cm/s MR Peak grad:    69.2 mmHg MR Mean grad:    48.0 mmHg   SHUNTS MR Vmax:         416.00 cm/s Systemic VTI:  0.12 m MR Vmean:        331.0 cm/s  Systemic Diam: 2.40 cm MR PISA:         1.57 cm MR PISA Eff ROA: 17 mm MR PISA Radius:  0.50 cm MV E velocity: 42.20 cm/s MV A velocity: 38.60 cm/s MV E/A ratio:  1.09 Tobias Alexander MD Electronically signed by Tobias Alexander MD Signature Date/Time: 09/02/2020/5:00:07 PM    Final     Cardiac Studies   Echocardiogram 09/02/20: 1. Left ventricular ejection fraction, by estimation, is 25 to 30%. The  left ventricle has severely decreased function. The left ventricle  demonstrates global hypokinesis. Left ventricular diastolic parameters are  consistent with Grade I  diastolic  dysfunction (impaired relaxation).  2. Right ventricular systolic function is moderately reduced. The right  ventricular size is mildly enlarged. There is normal pulmonary artery  systolic pressure. The estimated right ventricular systolic pressure is  29.0 mmHg.  3. The pericardial effusion is posterior to the left ventricle. There is  no evidence of cardiac tamponade.  4. The mitral valve is normal in structure. Moderate mitral valve  regurgitation. No evidence of mitral stenosis.  5. Tricuspid valve regurgitation is moderate.  6. The aortic valve is  normal in structure. Aortic valve regurgitation is  not visualized. No aortic stenosis is present.  7. The inferior vena cava is dilated in size with <50% respiratory  variability, suggesting right atrial pressure of 15 mmHg.   Patient Profile     29 y.o. 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   Assessment & Plan    1. New onset cardiomyopathy/acute combined CHF: patient initially presented with AMS after being found down at home. Given narcan in the field with some improvement in symptoms. There was some c/f seizure activity given incontinence and tongue bite at home. She was took 1 Tylenol #3 following a root cannal the day before admission, c/w Utox that was positive for opiates. WBC and lactate elevated, and with hypotension, picture was c/f sepsis. Initial work-up revealed LLL PNA seen on CTA Chest. Procal elevated to 17. She was started on IV levaquin. She was found to have elevated trops (205>223) - low flat trend not c/w ACS. Echo obtained revealing EF 25-30%, G1DD, moderately reduced RV systolic function, pericardial effusion without tamponade, and moderate MR/TR. It was felt her cardiomyopathy was likely stress induced. She was given IVFs with improvement in hypotension and lactate level.  - Will plan to initiate GDMT as BP/Cr will allow.  - Plan repeat echocardiogram once recovers from acute illness  2. Acute CVA: patient underwent MRI to evaluate AMS and possible seizure activity which revealed small acute infarcts in the left occipitotemporal and left cerebellum regions. Neurology consulted and feel presentation could be suspicious for prolonged hypoxia in the setting of opiate overdose possible precipitating seizure activity, though in the setting of LV dysfunction, cannot exclude cardioembolic event - recommended for TEE to further evaluate - TEE scheduled for 09/06/20 at 2pm with Dr. Tenny Craw.  The risks and benefits of transesophageal echocardiogram have been explained including risks of esophageal damage, perforation (1:10,000 risk), bleeding, pharyngeal hematoma as well as other potential complications associated with conscious sedation including aspiration, arrhythmia, respiratory failure and death. Alternatives to treatment were discussed, questions were answered. Patient is willing to proceed.  - Will hold off on initiating GDMT to allow for permissive HTN in the setting of acute CVA  3. PNA/septic shock: found to have LLL PNA on CTA Chest c/f aspiration given presentation. Procal elevated to 17. Started on IV levaquin - Continue management per primary team.     For questions or updates, please contact [facility] Please consult [email or web address] for contact info under       Signed, Beatriz Stallion, PA-C  09/03/2020, 10:34 AM    Patient seen and examined.  Agree with above documentation.  On exam, patient is alert and oriented, regular rate and rhythm, no murmurs, lungs CTAB, no LE edema or JVD.  Hypotension and tachycardia have resolved with IV fluid resuscitation.  Renal function continues to improve, 1.1 today.  Lactate has normalized.  Procalcitonin was 17.  Presentation consistent with septic shock.  On Levaquin for aspiration pneumonia.  Shock resolved with IV fluids.  Echocardiogram shows new systolic dysfunction (EF 25 to 30%), suspect stress-induced cardiomyopathy.  Also found to have multiple small strokes on brain MRI.  Neurology following.  TEE planned for Monday.  Little Ishikawa, MD

## 2020-09-03 NOTE — Progress Notes (Signed)
STROKE TEAM PROGRESS NOTE   INTERVAL HISTORY Speech therapist at the bedside. Pt sitting in bed, mildly lethargic but neuro stable, no neuro deficit at this time. 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 EF 25-30%. Blood culture pending. Plan to do TEE next Monday.   Vitals:   09/03/20 0013 09/03/20 0449 09/03/20 1052 09/03/20 1616  BP: 110/71 121/86 (!) 125/97 125/82  Pulse: 80 65 79 63  Resp:  18 15 19   Temp: 98.4 F (36.9 C) 99 F (37.2 C) 98.8 F (37.1 C) 98.9 F (37.2 C)  TempSrc: Oral Oral Oral Oral  SpO2: 100% 99% 95% 92%  Weight:   97.2 kg   Height:   5\' 6"  (1.676 m)    CBC:  Recent Labs  Lab 09/02/20 1220 09/03/20 0212  WBC 24.3* 22.1*  NEUTROABS 21.5* 18.5*  HGB 10.9* 9.7*  HCT 35.2* 30.4*  MCV 92.6 88.6  PLT 220 204   Basic Metabolic Panel:  Recent Labs  Lab 09/02/20 1835 09/03/20 0212  NA 135 135  K 4.5 4.2  CL 104 103  CO2 22 24  GLUCOSE 123* 85  BUN 15 14  CREATININE 1.34* 1.06*  CALCIUM 8.2* 8.3*   Lipid Panel:  Recent Labs  Lab 09/03/20 0212  CHOL 95  TRIG 35  HDL 39*  CHOLHDL 2.4  VLDL 7  LDLCALC 49   HgbA1c:  Recent Labs  Lab 09/03/20 0212  HGBA1C 5.8*   Urine Drug Screen:  Recent Labs  Lab 09/02/20 1034  LABOPIA POSITIVE*  COCAINSCRNUR NONE DETECTED  LABBENZ NONE DETECTED  AMPHETMU NONE DETECTED  THCU NONE DETECTED  LABBARB NONE DETECTED    Alcohol Level  Recent Labs  Lab 09/02/20 1040  ETH <10    IMAGING past 24 hours EEG  Result Date: 09/03/2020 Charlsie Quest, MD     09/03/2020  1:12 PM Patient Name: Tabitha Smith MRN: [id number] Epilepsy Attending: Charlsie Quest Referring Physician/Provider: Dr Genia Hotter Date: 09/03/2020 Duration: 27.24 mins Patient history: 29 y.o. female with AMS. 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 EEG to evaluate for seizure. Level of alertness: Awake, asleep AEDs during EEG study: None Technical  aspects: This EEG study was done with scalp electrodes positioned according to the 10-20 International system of electrode placement. Electrical activity was acquired at a sampling rate of 500Hz  and reviewed with a high frequency filter of 70Hz  and a low frequency filter of 1Hz . EEG data were recorded continuously and digitally stored. Description: The posterior dominant rhythm consists of 8-9 Hz activity of moderate voltage (25-35 uV) seen predominantly in posterior head regions, symmetric and reactive to eye opening and eye closing. Sleep was characterized by vertex waves, sleep spindles (12 to 14 Hz), maximal frontocentral region.   Hyperventilation and photic stimulation were not performed.   IMPRESSION: This study is within normal limits. No seizures or epileptiform discharges were seen throughout the recording. Tabitha Smith   CT ANGIO HEAD W OR WO CONTRAST  Result Date: 09/03/2020 CLINICAL DATA:  Acute left cerebellar and left temporo-occipital infarcts on MRI. EXAM: CT ANGIOGRAPHY HEAD AND NECK TECHNIQUE: Multidetector CT imaging of the head and neck was performed using the standard protocol during bolus administration of intravenous contrast. Multiplanar CT image reconstructions and MIPs were obtained to evaluate the vascular anatomy. Carotid stenosis measurements (when applicable) are obtained utilizing NASCET criteria, using the distal internal carotid diameter as the denominator. CONTRAST:  60mL OMNIPAQUE  IOHEXOL 350 MG/ML SOLN COMPARISON:  Chest CTA 09/02/2020. FINDINGS: CTA NECK FINDINGS Aortic arch: Normal variant aortic arch branching pattern with common origin of the brachiocephalic and left common carotid arteries. Widely patent arch vessel origins. Right carotid system: Patent without evidence of stenosis, dissection, or significant atherosclerosis. Left carotid system: Patent without evidence of stenosis, dissection, or significant atherosclerosis. Vertebral arteries: Patent without  evidence of stenosis, dissection, or significant atherosclerosis. Codominant. Skeleton: No acute osseous abnormality or suspicious osseous lesion. Other neck: No evidence of cervical lymphadenopathy or mass. Upper chest: Patchy ground-glass opacities in the visualized portions of the left upper and left lower lobes, similar in appearance to yesterday's chest CTA. Small bilateral pleural effusions. Review of the MIP images confirms the above findings CTA HEAD FINDINGS Anterior circulation: The internal carotid arteries are widely patent from skull base to carotid termini. ACAs and MCAs are patent without evidence of a proximal branch occlusion or significant proximal stenosis. No aneurysm is identified. Posterior circulation: The intracranial vertebral arteries are widely patent to the basilar. The left PICA and right AICA appear dominant. Patent SCA is are seen bilaterally. The basilar artery is widely patent. There are left larger than right posterior communicating arteries. Both PCAs are patent without evidence of a significant proximal stenosis. No aneurysm is identified. Venous sinuses: Patent. Anatomic variants: None. Review of the MIP images confirms the above findings IMPRESSION: 1. No large vessel occlusion or significant stenosis in the head and neck. 2. Patchy ground-glass opacities in the left lung, more fully evaluated on yesterday's chest CTA. Small bilateral pleural effusions. Electronically Signed   By: Sebastian Ache M.D.   On: 09/03/2020 10:24   CT ANGIO NECK W OR WO CONTRAST  Result Date: 09/03/2020 CLINICAL DATA:  Acute left cerebellar and left temporo-occipital infarcts on MRI. EXAM: CT ANGIOGRAPHY HEAD AND NECK TECHNIQUE: Multidetector CT imaging of the head and neck was performed using the standard protocol during bolus administration of intravenous contrast. Multiplanar CT image reconstructions and MIPs were obtained to evaluate the vascular anatomy. Carotid stenosis measurements (when  applicable) are obtained utilizing NASCET criteria, using the distal internal carotid diameter as the denominator. CONTRAST:  54mL OMNIPAQUE IOHEXOL 350 MG/ML SOLN COMPARISON:  Chest CTA 09/02/2020. FINDINGS: CTA NECK FINDINGS Aortic arch: Normal variant aortic arch branching pattern with common origin of the brachiocephalic and left common carotid arteries. Widely patent arch vessel origins. Right carotid system: Patent without evidence of stenosis, dissection, or significant atherosclerosis. Left carotid system: Patent without evidence of stenosis, dissection, or significant atherosclerosis. Vertebral arteries: Patent without evidence of stenosis, dissection, or significant atherosclerosis. Codominant. Skeleton: No acute osseous abnormality or suspicious osseous lesion. Other neck: No evidence of cervical lymphadenopathy or mass. Upper chest: Patchy ground-glass opacities in the visualized portions of the left upper and left lower lobes, similar in appearance to yesterday's chest CTA. Small bilateral pleural effusions. Review of the MIP images confirms the above findings CTA HEAD FINDINGS Anterior circulation: The internal carotid arteries are widely patent from skull base to carotid termini. ACAs and MCAs are patent without evidence of a proximal branch occlusion or significant proximal stenosis. No aneurysm is identified. Posterior circulation: The intracranial vertebral arteries are widely patent to the basilar. The left PICA and right AICA appear dominant. Patent SCA is are seen bilaterally. The basilar artery is widely patent. There are left larger than right posterior communicating arteries. Both PCAs are patent without evidence of a significant proximal stenosis. No aneurysm is identified. Venous sinuses: Patent. Anatomic  variants: None. Review of the MIP images confirms the above findings IMPRESSION: 1. No large vessel occlusion or significant stenosis in the head and neck. 2. Patchy ground-glass opacities  in the left lung, more fully evaluated on yesterday's chest CTA. Small bilateral pleural effusions. Electronically Signed   By: Sebastian Ache M.D.   On: 09/03/2020 10:24   MR BRAIN W WO CONTRAST  Addendum Date: 09/02/2020   ADDENDUM REPORT: 09/02/2020 22:12 ADDENDUM: These results will be called to the ordering clinician or representative by the Radiologist Assistant, and communication documented in the PACS or [employer]. Electronically Signed   By: Stana Bunting M.D.   On: 09/02/2020 22:12   Result Date: 09/02/2020 CLINICAL DATA:  Mental status change, unknown cause EXAM: MRI HEAD WITHOUT AND WITH CONTRAST TECHNIQUE: Multiplanar, multiecho pulse sequences of the brain and surrounding structures were obtained without and with intravenous contrast. CONTRAST:  8.49mL GADAVIST GADOBUTROL 1 MMOL/ML IV SOLN COMPARISON:  05/22/2019 head CT.  08/24/2018 MRI head. FINDINGS: Brain: Multifocal acute infarcts involving the left occipitotemporal region and left cerebellum. No intracranial hemorrhage. No midline shift, ventriculomegaly or extra-axial fluid collection. No mass lesion. No abnormal enhancement. Cerebral volume is within normal limits. Vascular: Normal flow voids. Skull and upper cervical spine: Normal marrow signal. Sinuses/Orbits: Normal orbits. Right maxillary and sphenoid sinus mucous retention cysts. Clear mastoid air cells. Other: Please note that image quality is mildly degraded by motion artifact. 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 Electronically Signed: By: Stana Bunting M.D. On: 09/02/2020 21:44   ECHOCARDIOGRAM COMPLETE  Result Date: 09/02/2020    ECHOCARDIOGRAM LIMITED REPORT   Patient Name:   [facility] Date of Exam: 09/02/2020 Medical Rec #:  [id number]       Height:       66.0 in Accession #:    [id number]      Weight:       185.0 lb Date of Birth:  Aug 31, 1991       BSA:          1.935 m Patient Age:    29  years        BP:           101/76 mmHg Patient Gender: F               HR:           73 bpm. Exam Location:  Inpatient Procedure: 2D Echo Indications:    AMS, loss of conciousness, sepsis  History:        Patient has no prior history of Echocardiogram examinations.                 CHF.  Sonographer:    Thurman Coyer RDCS (AE) Referring Phys: [id number] Estevan Ryder MCINTYRE IMPRESSIONS  1. Left ventricular ejection fraction, by estimation, is 25 to 30%. The left ventricle has severely decreased function. The left ventricle demonstrates global hypokinesis. Left ventricular diastolic parameters are consistent with Grade I diastolic dysfunction (impaired relaxation).  2. Right ventricular systolic function is moderately reduced. The right ventricular size is mildly enlarged. There is normal pulmonary artery systolic pressure. The estimated right ventricular systolic pressure is 29.0 mmHg.  3. The pericardial effusion is posterior to the left ventricle. There is no evidence of cardiac tamponade.  4. The mitral valve is normal in structure. Moderate mitral valve regurgitation. No evidence of mitral stenosis.  5. Tricuspid valve regurgitation is moderate.  6. The aortic valve is  normal in structure. Aortic valve regurgitation is not visualized. No aortic stenosis is present.  7. The inferior vena cava is dilated in size with <50% respiratory variability, suggesting right atrial pressure of 15 mmHg. FINDINGS  Left Ventricle: Left ventricular ejection fraction, by estimation, is 25 to 30%. The left ventricle has severely decreased function. The left ventricle demonstrates global hypokinesis. The left ventricular internal cavity size was normal in size. There is no left ventricular hypertrophy. Left ventricular diastolic parameters are consistent with Grade I diastolic dysfunction (impaired relaxation). Normal left ventricular filling pressure. Right Ventricle: The right ventricular size is mildly enlarged. No increase in right  ventricular wall thickness. Right ventricular systolic function is moderately reduced. There is normal pulmonary artery systolic pressure. The tricuspid regurgitant velocity is 1.87 m/s, and with an assumed right atrial pressure of 15 mmHg, the estimated right ventricular systolic pressure is 29.0 mmHg. Left Atrium: Left atrial size was normal in size. Right Atrium: Right atrial size was normal in size. Pericardium: Trivial pericardial effusion is present. The pericardial effusion is posterior to the left ventricle. There is no evidence of cardiac tamponade. Mitral Valve: The mitral valve is normal in structure. Moderate mitral valve regurgitation. No evidence of mitral valve stenosis. Tricuspid Valve: The tricuspid valve is normal in structure. Tricuspid valve regurgitation is moderate . No evidence of tricuspid stenosis. Aortic Valve: The aortic valve is normal in structure. Aortic valve regurgitation is not visualized. No aortic stenosis is present. Pulmonic Valve: The pulmonic valve was normal in structure. Pulmonic valve regurgitation is trivial. No evidence of pulmonic stenosis. Aorta: The aortic root is normal in size and structure. Venous: The inferior vena cava is dilated in size with less than 50% respiratory variability, suggesting right atrial pressure of 15 mmHg. IAS/Shunts: No atrial level shunt detected by color flow Doppler. LEFT VENTRICLE PLAX 2D LVIDd:         5.11 cm      Diastology LVIDs:         4.15 cm      LV e' medial:    6.83 cm/s LV PW:         0.80 cm      LV E/e' medial:  6.2 LV IVS:        0.95 cm      LV e' lateral:   8.67 cm/s LVOT diam:     2.40 cm      LV E/e' lateral: 4.9 LV SV:         53 LV SV Index:   28 LVOT Area:     4.52 cm  LV Volumes (MOD) LV vol d, MOD A2C: 106.0 ml LV vol d, MOD A4C: 83.4 ml LV vol s, MOD A2C: 65.9 ml LV vol s, MOD A4C: 45.5 ml LV SV MOD A2C:     40.1 ml LV SV MOD A4C:     83.4 ml LV SV MOD BP:      40.7 ml RIGHT VENTRICLE RV S prime:     8.67 cm/s TAPSE  (M-mode): 1.2 cm LEFT ATRIUM             Index       RIGHT ATRIUM           Index LA diam:        3.10 cm 1.60 cm/m  RA Area:     13.60 cm LA Vol (A2C):   50.0 ml 25.84 ml/m RA Volume:   33.60 ml  17.37 ml/m LA Vol (  A4C):   25.0 ml 12.92 ml/m LA Biplane Vol: 35.9 ml 18.56 ml/m  AORTIC VALVE LVOT Vmax:   60.60 cm/s LVOT Vmean:  41.900 cm/s LVOT VTI:    0.118 m  AORTA Ao Root diam: 3.00 cm MITRAL VALVE                 TRICUSPID VALVE MV Area (PHT): 4.31 cm      TR Peak grad:   14.0 mmHg MV Decel Time: 176 msec      TR Vmax:        187.00 cm/s MR Peak grad:    69.2 mmHg MR Mean grad:    48.0 mmHg   SHUNTS MR Vmax:         416.00 cm/s Systemic VTI:  0.12 m MR Vmean:        331.0 cm/s  Systemic Diam: 2.40 cm MR PISA:         1.57 cm MR PISA Eff ROA: 17 mm MR PISA Radius:  0.50 cm MV E velocity: 42.20 cm/s MV A velocity: 38.60 cm/s MV E/A ratio:  1.09 Tobias Alexander MD Electronically signed by Tobias Alexander MD Signature Date/Time: 09/02/2020/5:00:07 PM    Final     PHYSICAL EXAM  Temp:  [98.3 F (36.8 C)-99 F (37.2 C)] 98.3 F (36.8 C) (11/05 2110) Pulse Rate:  [63-80] 75 (11/05 2110) Resp:  [15-19] 17 (11/05 2110) BP: (110-136)/(71-97) 136/88 (11/05 2110) SpO2:  [92 %-100 %] 95 % (11/05 2110) Weight:  [97.2 kg] 97.2 kg (11/05 1052)  General - Well nourished, well developed, in no apparent distress, mildly lethargic.  Ophthalmologic - fundi not visualized due to noncooperation.  Cardiovascular - Regular rhythm and rate.  Mental Status -  Level of arousal and orientation to time, place, and person were intact. Language including expression, naming, repetition, comprehension was assessed and found intact. Fund of Knowledge was assessed and was intact.  Cranial Nerves II - XII - II - Visual field intact OU. III, IV, VI - Extraocular movements intact. V - Facial sensation intact bilaterally. VII - Facial movement intact bilaterally. VIII - Hearing & vestibular intact bilaterally. X  - Palate elevates symmetrically. XI - Chin turning & shoulder shrug intact bilaterally. XII - Tongue protrusion intact.  Motor Strength - The patient's strength was normal in all extremities and pronator drift was absent.  Bulk was normal and fasciculations were absent.   Motor Tone - Muscle tone was assessed at the neck and appendages and was normal.  Reflexes - The patient's reflexes were symmetrical in all extremities and she had no pathological reflexes.  Sensory - Light touch, temperature/pinprick were assessed and were symmetrical.    Coordination - The patient had normal movements in the hands and feet with no ataxia or dysmetria.  Tremor was absent.  Gait and Station - deferred.   ASSESSMENT/PLAN [name] is a 29 y.o. female with history of migraines, depression and IBS w/ root canal the day prior found unresponsive in her bed, incontinent w/ tongue bite. MRI done as part of workup shows acute infarcts.   Stroke:  Multifocal L occipitotemporal and L cerebellar infarcts, embolic most likely secondary to new cardiomyopathy w/ EF 25-30%  MRI  Multifocal L occipitotemporal and L cerebellar infarcts  CTA head & neck no LVO or significant stenosis. Patchy LLL ground-glass opacities  2D Echo EF 25-30%. No source of embolus   EEG no sz  TEE pending 11/8  LDL 49  HgbA1c 5.8  Hypercoagulable work up pending  VTE prophylaxis - Lovenox 40 mg sq daily   No antithrombotic prior to admission, now on aspirin 81 mg daily.   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   EF 25-30%  TEE pending Monday   ? Substance abuse  UDS opiates POSITIVE   Pt on the phone with family, counseling not done yet  ? Tyl #3 following root canal the day prior  Tobacco abuse  Current smoker  Smoking  cessation counseling provided  Pt is willing to quit     Other Stroke Risk Factors  ALCOHOL use, advised to drink no more than 1 drink per day  Obesity, Body mass index is 34.59 kg/m., BMI >/= 30 associated with increased stroke risk, recommend weight loss, diet and exercise as appropriate   Migraines recently started on Aimovig - following with Dr. Lucia Gaskins  Other Active Problems  LLL PNA/septic shock  Leukocytosis WBC 22.1  Depression  IBS  Transaminitis AST/ALT 70/56  AKI, Cre 1.96->1.06  Hospital day # 0  I spent  35 minutes in total face-to-face time with the patient, more than 50% of which was spent in counseling and coordination of care, reviewing test results, images and medication, and discussing the diagnosis, treatment plan and potential prognosis. This patient's care requiresreview of multiple databases, neurological assessment, discussion with family, other specialists and medical decision making of high complexity.  Marvel Plan, MD PhD Stroke Neurology 09/03/2020 11:03 PM   To contact Stroke Continuity provider, please refer to [email or web address]. After hours, contact General Neurology

## 2020-09-03 NOTE — Progress Notes (Addendum)
Family Medicine Teaching Service Daily Progress Note Intern Pager: [phone]  Patient name: Tabitha Smith Medical record number: [id number] Date of birth: April 28, 1991 Age: 29 y.o. Gender: female  Primary Care Provider: Carmel Sacramento, NP Consultants: Neuro, Cardiology Code Status: FULL  Pt Overview and Major Events to Date:   11/4: admitted, cardiology and neurology consulted, echo demonstrated EF 25-30%, MRI showed multifocal infarcts  Assessment and Plan:  Tabitha Smith is a 29 y.o female who presented after being found unresponsive. PMHx significant for chronic migraines, depression, hx of SI, and IBS.  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 Seems to be nearing her baseline. Neuro exam unremarkable. Differential for underlying etiology of AMS: multifocal infarcts vs. seizure vs. Sepsis/hypotension or any combination of these. Underlying etiology of her multifocal acute infarcts remains unknown. A1c 5.8, Lipid panel wnl (HDL slightly low at 39). Possibly embolic although unclear source. -Neuro following, appreciate recommendations -CTA Head/Neck this morning -EEG ordered -Plan for TEE Monday 11/8 -ASA 81mg  daily -f/u hypercoag labs -SLP eval and treat -PT/OT eval and treat  Sepsis due to Aspiration vs. Community Acquired Pneumonia Currently stable on 3L [place]. Lactic acidosis resolved (LA downtrended to 1.3 this morning). Has remained afebrile. WBC 22.1 this morning. Blood cx no growth <24h. -Continue Levaquin per pharmacy -Monitor fever curve -Daily CBC -Supplemental O2 as needed, wean as tolerated, goal SpO2 >92% -f/u blood cx results  New onset, acute HF with reduced EF, 25-30% Echo demonstrated EF 25-30% with global hypokinesis of LV. 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 -Cardiology following, appreciate recommendations -TEE scheduled for Monday  11/8 -Consider repeat TTE over the weekend  Elevated Troponins Troponin 205 > 223. EKG unremarkable. Thought to be due to demand ischemia. -Cardiology following, appreciate recommendations  Depression, well-controlled   hx of SI Chronic, stable. Denies SI. Home meds: Cymbalta 60mg  daily, prazosin 1mg  BID, seroquel 50mg  QHS.  -Continue home meds  IBS Chronic, stable. -Holding home Linzess   FEN/GI: NPO pending SLP eval PPx: Lovenox   Status is: Observation The patient will require care spanning > 2 midnights and should be moved to inpatient because: Altered mental status, Ongoing diagnostic testing needed not appropriate for outpatient work up and Inpatient level of care appropriate due to severity of illness  Dispo: The patient is from: Home              Anticipated d/c is to: Home              Anticipated d/c date is: 3 days              Patient currently is not medically stable to d/c.   Subjective:  Patient complaining of central chest pressure unchanged from admission. States its exacerbated by deep breaths. Also reports R temporal headache this morning. Patient is hungry and asking for chicken.   Objective: Temp:  [98.1 F (36.7 C)-99 F (37.2 C)] 99 F (37.2 C) (11/05 0449) Pulse Rate:  [65-136] 65 (11/05 0449) Resp:  [11-24] 18 (11/05 0449) BP: (75-121)/(42-86) 121/86 (11/05 0449) SpO2:  [93 %-100 %] 99 % (11/05 0449) Weight:  [90.1 kg] 90.1 kg (11/04 2057) Physical Exam: General: alert, NAD HEENT: tongue bite to L lateral tongue Cardiovascular: RRR, normal S1/S2 without m/r/g Respiratory: lungs CTAB Abdomen: +BS, soft, nontender, nondistended Extremities: no peripheral edema Neuro: normal speech, CN II-VII intact, 5/5 strength in all extremities, gross sensation intact  Laboratory: Recent Labs  Lab 09/02/20 1220 09/03/20 0212  WBC 24.3* 22.1*  HGB 10.9* 9.7*  HCT 35.2* 30.4*  PLT 220 204   Recent Labs  Lab 09/02/20 1557 09/02/20 1835  09/03/20 0212  NA 134* 135 135  K 5.2* 4.5 4.2  CL 103 104 103  CO2 22 22 24   BUN 17 15 14   CREATININE 1.36* 1.34* 1.06*  CALCIUM 8.1* 8.2* 8.3*  PROT 5.4* 6.1* 5.5*  BILITOT 0.3 0.5 0.3  ALKPHOS 51 48 44  ALT 62* 66* 56*  AST 90* 93* 70*  GLUCOSE 139* 123* 85    Imaging/Diagnostic Tests:  DG Chest Portable 1 View Result Date: 09/02/2020 IMPRESSION: Diffuse left lung infiltrate consistent with pneumonia.  CT Angio Chest PE W and/or Wo Contrast Result Date: 09/02/2020 IMPRESSION: No central, segmental or subsegmental pulmonary embolism. Multifocal patchy airspace opacities which could be due to multifocal pneumonia pulmonary edema, or aspiration.   MR BRAIN W WO CONTRAST Result Date: 09/02/2020 IMPRESSION: Multifocal acute infarcts involving the left occipitotemporal region and left cerebellum.   ECHOCARDIOGRAM COMPLETE Result Date: 09/02/2020 IMPRESSIONS  1. Left ventricular ejection fraction, by estimation, is 25 to 30%. The left ventricle has severely decreased function. The left ventricle demonstrates global hypokinesis. Left ventricular diastolic parameters are consistent with Grade I diastolic dysfunction (impaired relaxation).  2. Right ventricular systolic function is moderately reduced. The right ventricular size is mildly enlarged. There is normal pulmonary artery systolic pressure. The estimated right ventricular systolic pressure is 29.0 mmHg.  3. The pericardial effusion is posterior to the left ventricle. There is no evidence of cardiac tamponade.  4. The mitral valve is normal in structure. Moderate mitral valve regurgitation. No evidence of mitral stenosis.  5. Tricuspid valve regurgitation is moderate.  6. The aortic valve is normal in structure. Aortic valve regurgitation is not visualized. No aortic stenosis is present.  7. The inferior vena cava is dilated in size with <50% respiratory variability, suggesting right atrial pressure of 15 mmHg.     13/01/2020,  MD 09/03/2020, 7:43 AM PGY-1, [facility] FPTS Intern pager: [phone], text pages welcome

## 2020-09-04 DIAGNOSIS — I248 Other forms of acute ischemic heart disease: Secondary | ICD-10-CM | POA: Diagnosis not present

## 2020-09-04 DIAGNOSIS — I429 Cardiomyopathy, unspecified: Secondary | ICD-10-CM

## 2020-09-04 DIAGNOSIS — J690 Pneumonitis due to inhalation of food and vomit: Secondary | ICD-10-CM | POA: Diagnosis not present

## 2020-09-04 DIAGNOSIS — I6340 Cerebral infarction due to embolism of unspecified cerebral artery: Secondary | ICD-10-CM | POA: Diagnosis not present

## 2020-09-04 DIAGNOSIS — A419 Sepsis, unspecified organism: Secondary | ICD-10-CM | POA: Diagnosis not present

## 2020-09-04 LAB — COMPREHENSIVE METABOLIC PANEL
ALT: 85 U/L — ABNORMAL HIGH (ref 0–44)
AST: 74 U/L — ABNORMAL HIGH (ref 15–41)
Albumin: 3.2 g/dL — ABNORMAL LOW (ref 3.5–5.0)
Alkaline Phosphatase: 46 U/L (ref 38–126)
Anion gap: 8 (ref 5–15)
BUN: 7 mg/dL (ref 6–20)
CO2: 26 mmol/L (ref 22–32)
Calcium: 8.7 mg/dL — ABNORMAL LOW (ref 8.9–10.3)
Chloride: 104 mmol/L (ref 98–111)
Creatinine, Ser: 1.11 mg/dL — ABNORMAL HIGH (ref 0.44–1.00)
GFR, Estimated: >60 mL/min (ref >60)
Glucose, Bld: 125 mg/dL — ABNORMAL HIGH (ref 70–99)
Potassium: 3.9 mmol/L (ref 3.5–5.1)
Sodium: 138 mmol/L (ref 135–145)
Total Bilirubin: 0.2 mg/dL — ABNORMAL LOW (ref 0.3–1.2)
Total Protein: 6 g/dL — ABNORMAL LOW (ref 6.5–8.1)

## 2020-09-04 LAB — CBC
HCT: 32.5 % — ABNORMAL LOW (ref 36.0–46.0)
Hemoglobin: 10.5 g/dL — ABNORMAL LOW (ref 12.0–15.0)
MCH: 28.4 pg (ref 26.0–34.0)
MCHC: 32.3 g/dL (ref 30.0–36.0)
MCV: 87.8 fL (ref 80.0–100.0)
Platelets: 234 10*3/uL (ref 150–400)
RBC: 3.7 MIL/uL — ABNORMAL LOW (ref 3.87–5.11)
RDW: 14.6 % (ref 11.5–15.5)
WBC: 12.7 10*3/uL — ABNORMAL HIGH (ref 4.0–10.5)
nRBC: 0 % (ref 0.0–0.2)

## 2020-09-04 LAB — PROTEIN C, TOTAL: Protein C, Total: 90 % (ref 60–150)

## 2020-09-04 LAB — CARDIOLIPIN ANTIBODIES, IGG, IGM, IGA
Anticardiolipin IgA: <9 APL U/mL (ref 0–11)
Anticardiolipin IgG: <9 GPL U/mL (ref 0–14)
Anticardiolipin IgM: 19 MPL U/mL — ABNORMAL HIGH (ref 0–12)

## 2020-09-04 LAB — HOMOCYSTEINE: Homocysteine: 6.9 umol/L (ref 0.0–14.5)

## 2020-09-04 MED ORDER — CARVEDILOL 3.125 MG PO TABS
3.1250 mg | ORAL_TABLET | Freq: Two times a day (BID) | ORAL | Status: DC
  Administered 2020-09-04 – 2020-09-07 (×6): 3.125 mg via ORAL
  Filled 2020-09-04 (×6): qty 1

## 2020-09-04 MED ORDER — LEVOFLOXACIN 750 MG PO TABS
750.0000 mg | ORAL_TABLET | Freq: Every day | ORAL | Status: AC
  Administered 2020-09-05 – 2020-09-07 (×4): 750 mg via ORAL
  Filled 2020-09-04 (×4): qty 1

## 2020-09-04 NOTE — Progress Notes (Signed)
Family Medicine Teaching Service Daily Progress Note Intern Pager: [phone]  Patient name: Tabitha Smith Medical record number: [id number] Date of birth: 1991/05/09 Age: 29 y.o. Gender: female  Primary Care Provider: Carmel Sacramento, NP Consultants: Neurology, Cardiology Code Status: FULL  Pt Overview and Major Events to Date:   11/4: admitted, neuro and cards consulted  MRI showed multifocal infarcts  Echo with EF 25-30%  Assessment and Plan:  Tava Peery is a 29 y.o female who presented after being found unresponsive. PMHx significant for chronic migraines, depression, hx of SI, and IBS.  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 Thought to be embolic but from unknown source at this point. Blood cx no growth x2 days. -Neuro following, appreciate recommendations -Plan for TEE 11/8 -Hypercoag workup pending -Continue ASA 81mg  daily -PT/OT -SLP  New onset acute HF with reduced EF, 25-30%  Elevated Troponins Echo on admission showed EF 25-30% with global hypokinesis of LV. Unclear etiology although thought to be sepsis/stress induced cardiomyopathy per cardiology. -Plan for TEE 11/8 -Plan for repeat echo when acute illness resolves -Eventual plan to initiate guideline directed medical therapy for CHF per cards  Sepsis due to Aspiration vs. Community Acquired Pneumonia Improving significantly. Remains afebrile, WBC 12.7 today (from 22.1 yesterday). BP has been stable in 120s-130s systolic. -Continue Levaquin, plan to transition to PO today  Transaminitis Patient's LFTs remain slightly elevated AST 74, ALT 85. Possibly due to hypoperfusion during initial unresponsive/hypotensive event. -Daily CMP  AKI, resolved Patient presented with Cr 1.96, thought to be pre-renal in the setting of hypotension. Resolved with fluid resuscitation. Cr 1.11 this morning. -Daily CMP  Depression, well-controlled  hx of SI Chronic, stable.  Denies SI. Home meds: Cymbalta 60mg  daily, prazosin 1mg  BID, seroquel 50mg  QHS.  -Continue home meds  IBS Chronic, stable. -Holding home Linzess  Migraines Home meds: Aimovig injection 70 mg q 14 days (last dose 10/30). -Continue to monitor  FEN/GI: Dysphagia 2 diet PPx: Lovenox   Status is: Inpatient Remains inpatient appropriate because:Ongoing diagnostic testing needed not appropriate for outpatient work up   Dispo: The patient is from: Home              Anticipated d/c is to: Home              Anticipated d/c date is: > 3 days              Patient currently is not medically stable to d/c.   Subjective:  No acute events overnight. Patient states she feels alright today. Still with central chest pain but denies headache or other complaints at this time.  Objective: Temp:  [98.3 F (36.8 C)-99.2 F (37.3 C)] 99.2 F (37.3 C) (11/06 1135) Pulse Rate:  [63-75] 69 (11/06 1135) Resp:  [17-19] 17 (11/06 1135) BP: (121-136)/(76-95) 121/76 (11/06 1135) SpO2:  [92 %-99 %] 99 % (11/06 1135) Weight:  [94.4 kg] 94.4 kg (11/06 0429) Physical Exam: General: alert, well-appearing, NAD Cardiovascular: RRR, normal S1/S2 without m/r/g Respiratory: normal WOB, crackles in L mid to lower lung zone  Abdomen: +BS, soft, nontender, nondistended Extremities: no peripheral edema, 2+ distal pulses, normal ROM and strength  Neuro: CN II-VII normal, normal speech, full ROM and strength in all extremities  Laboratory: Recent Labs  Lab 09/02/20 1220 09/03/20 0212 09/04/20 0832  WBC 24.3* 22.1* 12.7*  HGB 10.9* 9.7* 10.5*  HCT 35.2* 30.4* 32.5*  PLT 220 204 234   Recent Labs  Lab 09/02/20  1835 09/03/20 0212 09/04/20 0832  NA 135 135 138  K 4.5 4.2 3.9  CL 104 103 104  CO2 22 24 26   BUN 15 14 7   CREATININE 1.34* 1.06* 1.11*  CALCIUM 8.2* 8.3* 8.7*  PROT 6.1* 5.5* 6.0*  BILITOT 0.5 0.3 0.2*  ALKPHOS 48 44 46  ALT 66* 56* 85*  AST 93* 70* 74*  GLUCOSE 123* 85 125*     Imaging/Diagnostic Tests:  EEG Result Date: 09/03/2020 IMPRESSION: This study is within normal limits. No seizures or epileptiform discharges were seen throughout the recording.   CT ANGIO HEAD W OR WO CONTRAST Result Date: 09/03/2020 IMPRESSION: 1. No large vessel occlusion or significant stenosis in the head and neck. 2. Patchy ground-glass opacities in the left lung, more fully evaluated on yesterday's chest CTA. Small bilateral pleural effusions.   CT ANGIO NECK W OR WO CONTRAST Result Date: 09/03/2020 IMPRESSION: 1. No large vessel occlusion or significant stenosis in the head and neck. 2. Patchy ground-glass opacities in the left lung, more fully evaluated on yesterday's chest CTA. Small bilateral pleural effusions.    13/02/2020, MD 09/04/2020, 11:39 AM PGY-1, [facility] FPTS Intern pager: [phone], text pages welcome

## 2020-09-04 NOTE — Evaluation (Signed)
Occupational Therapy Evaluation Patient Details Name: Tabitha Smith MRN: [id number] DOB: October 19, 1991 Today's Date: 09/04/2020    History of Present Illness Pt is a 29 y.o. female admitted 09/02/20 with seizure-like activity and unresponsiveness at home. Found to be septic with PNA. 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 Potential for hypovolemic shock. PMH includes migraine, sinusitis.   Clinical Impression   Pt was independent prior to admission. She is the mother of a 61 year old boy and works as a Production designer, theatre/television/film at [employer]. Pt presents with impaired standing balance, but is overall functioning at a set up to min guard assist level. Moves slowly. Will follow acutely, do not anticipate pt will need post acute OT.    Follow Up Recommendations  No OT follow up    Equipment Recommendations  None recommended by OT    Recommendations for Other Services       Precautions / Restrictions Precautions Precautions: Fall Restrictions Weight Bearing Restrictions: No      Mobility Bed Mobility Overal bed mobility: Needs Assistance Bed Mobility: Supine to Sit     Supine to sit: Supervision     General bed mobility comments: increased time    Transfers Overall transfer level: Needs assistance Equipment used: None Transfers: Sit to/from Stand Sit to Stand: Min guard         General transfer comment: for safety, increased time to rise and steady    Balance Overall balance assessment: Needs assistance   Sitting balance-Leahy Scale: Normal     Standing balance support: No upper extremity supported;During functional activity Standing balance-Leahy Scale: Good                             ADL either performed or assessed with clinical judgement   ADL Overall ADL's : Needs assistance/impaired Eating/Feeding: Independent   Grooming: Wash/dry hands;Standing;Min guard   Upper Body Bathing: Set up;Sitting   Lower  Body Bathing: Min guard;Sit to/from stand   Upper Body Dressing : Set up;Sitting   Lower Body Dressing: Min guard;Sit to/from stand   Toilet Transfer: Min guard;Ambulation   Toileting- Clothing Manipulation and Hygiene: Min guard;Sit to/from stand       Functional mobility during ADLs: Min guard General ADL Comments: endorses changes in sensation on L side of neck and chest     Vision Baseline Vision/History: No visual deficits Patient Visual Report: No change from baseline       Perception     Praxis      Pertinent Vitals/Pain Pain Assessment: No/denies pain     Hand Dominance Left   Extremity/Trunk Assessment Upper Extremity Assessment Upper Extremity Assessment: Overall WFL for tasks assessed   Lower Extremity Assessment Lower Extremity Assessment: Defer to PT evaluation   Cervical / Trunk Assessment Cervical / Trunk Assessment: Normal   Communication Communication Communication: No difficulties   Cognition Arousal/Alertness: Awake/alert Behavior During Therapy: WFL for tasks assessed/performed Overall Cognitive Status: Within Functional Limits for tasks assessed                                     General Comments       Exercises     Shoulder Instructions      Home Living Family/patient expects to be discharged to:: Private residence Living Arrangements: Children;Other relatives (sister, 66 yo son, nieces and nephews) Available  Help at Discharge: Family Type of Home: House Home Access: Stairs to enter [employer] of Steps: 5   Home Layout: One level     Bathroom Shower/Tub: Tub/shower unit;Walk-in Human resources officer: Standard     Home Equipment: Shower seat          Prior Functioning/Environment Level of Independence: Independent (sits to shower)        Comments: 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  OT Frequency: Min 2X/week   Barriers to D/C:            Co-evaluation              AM-PAC OT "6 Clicks" Daily Activity     Outcome Measure Help from another person eating meals?: None Help from another person taking care of personal grooming?: A Little Help from another person toileting, which includes using toliet, bedpan, or urinal?: A Little Help from another person bathing (including washing, rinsing, drying)?: A Little Help from another person to put on and taking off regular upper body clothing?: None Help from another person to put on and taking off regular lower body clothing?: A Little 6 Click Score: 20   End of Session Equipment Utilized During Treatment: Gait belt  Activity Tolerance: Patient tolerated treatment well Patient left: in bed;with call bell/phone within reach;with bed alarm set  OT Visit Diagnosis: Other abnormalities of gait and mobility (R26.89)                Time: 1324-4010 OT Time Calculation (min): 17 min Charges:  OT General Charges $OT Visit: 1 Visit OT Evaluation $OT Eval Low Complexity: 1 Low Martie Round, OTR/L Acute Rehabilitation Services Pager: [phone] Office: [phone]  Evern Bio 09/04/2020, 9:53 AM

## 2020-09-04 NOTE — Progress Notes (Signed)
Progress Note  Patient Name: Tabitha Smith Date of Encounter: 09/04/2020  Primary Cardiologist: Little Ishikawa, MD  Subjective   Feels somewhat better, no shortness of breath or chest pain reported.  Inpatient Medications    Scheduled Meds: . aspirin EC  81 mg Oral Daily  . DULoxetine  60 mg Oral Daily  . enoxaparin (LOVENOX) injection  40 mg Subcutaneous Q24H  . mouth rinse  15 mL Mouth Rinse BID  . prazosin  1 mg Oral BID  . QUEtiapine  50 mg Oral QHS   Continuous Infusions: . levofloxacin (LEVAQUIN) IV Stopped (09/03/20 1711)   PRN Meds: ondansetron (ZOFRAN) IV   Vital Signs    Vitals:   09/03/20 2315 09/04/20 0429 09/04/20 0755 09/04/20 1135  BP: (!) 129/95 128/90 (!) 131/92 121/76  Pulse:  68 72 69  Resp: 18 18 19 17   Temp: 98.8 F (37.1 C) 98.9 F (37.2 C) 98.7 F (37.1 C) 99.2 F (37.3 C)  TempSrc: Oral Oral Oral Oral  SpO2: 98% 96% 98% 99%  Weight:  94.4 kg    Height:        Intake/Output Summary (Last 24 hours) at 09/04/2020 1143 Last data filed at 09/03/2020 2100 Gross per 24 hour  Intake 150.03 ml  Output 250 ml  Net -99.97 ml   Filed Weights   09/02/20 2057 09/03/20 1052 09/04/20 0429  Weight: 90.1 kg 97.2 kg 94.4 kg    Telemetry    Sinus rhythm.  Personally reviewed.  ECG    An ECG dated 09/03/2020 was personally reviewed today and demonstrated:  Sinus tachycardia with leftward axis.  Physical Exam   GEN: No acute distress.   Neck: No JVD. Cardiac: RRR, no gallop.  Respiratory: Nonlabored. Clear to auscultation bilaterally. GI: Soft, nontender, bowel sounds present. MS: No edema; No deformity.  Labs    Chemistry Recent Labs  Lab 09/02/20 1835 09/03/20 0212 09/04/20 0832  NA 135 135 138  K 4.5 4.2 3.9  CL 104 103 104  CO2 22 24 26   GLUCOSE 123* 85 125*  BUN 15 14 7   CREATININE 1.34* 1.06* 1.11*  CALCIUM 8.2* 8.3* 8.7*  PROT 6.1* 5.5* 6.0*  ALBUMIN 3.1* 3.0* 3.2*  AST 93* 70* 74*  ALT 66* 56* 85*    ALKPHOS 48 44 46  BILITOT 0.5 0.3 0.2*  GFRNONAA 54* >60 >60  ANIONGAP 9 8 8      Hematology Recent Labs  Lab 09/02/20 1220 09/03/20 0212 09/04/20 0832  WBC 24.3* 22.1* 12.7*  RBC 3.80* 3.43* 3.70*  HGB 10.9* 9.7* 10.5*  HCT 35.2* 30.4* 32.5*  MCV 92.6 88.6 87.8  MCH 28.7 28.3 28.4  MCHC 31.0 31.9 32.3  RDW 14.7 14.5 14.6  PLT 220 204 234    Cardiac Enzymes Recent Labs  Lab 09/02/20 1026 09/02/20 1557  TROPONINIHS 205* 223*    Radiology    EEG  Result Date: 09/03/2020 Charlsie Quest, MD     09/03/2020  1:12 PM Patient Name: Tabitha Smith MRN: [id number] Epilepsy Attending: Charlsie Quest Referring Physician/Provider: Dr Genia Hotter Date: 09/03/2020 Duration: 27.24 mins Patient history: 29 y.o. female with AMS. 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 EEG to evaluate for seizure. Level of alertness: Awake, asleep AEDs during EEG study: None Technical aspects: This EEG study was done with scalp electrodes positioned according to the 10-20 International system of electrode placement. Electrical activity was acquired at a sampling rate of  500Hz  and reviewed with a high frequency filter of 70Hz  and a low frequency filter of 1Hz . EEG data were recorded continuously and digitally stored. Description: The posterior dominant rhythm consists of 8-9 Hz activity of moderate voltage (25-35 uV) seen predominantly in posterior head regions, symmetric and reactive to eye opening and eye closing. Sleep was characterized by vertex waves, sleep spindles (12 to 14 Hz), maximal frontocentral region.   Hyperventilation and photic stimulation were not performed.   IMPRESSION: This study is within normal limits. No seizures or epileptiform discharges were seen throughout the recording. Priyanka   CT ANGIO HEAD W OR WO CONTRAST  Result Date: 09/03/2020 CLINICAL DATA:  Acute left cerebellar and left temporo-occipital infarcts on MRI.  EXAM: CT ANGIOGRAPHY HEAD AND NECK TECHNIQUE: Multidetector CT imaging of the head and neck was performed using the standard protocol during bolus administration of intravenous contrast. Multiplanar CT image reconstructions and MIPs were obtained to evaluate the vascular anatomy. Carotid stenosis measurements (when applicable) are obtained utilizing NASCET criteria, using the distal internal carotid diameter as the denominator. CONTRAST:  17mL OMNIPAQUE IOHEXOL 350 MG/ML SOLN COMPARISON:  Chest CTA 09/02/2020. FINDINGS: CTA NECK FINDINGS Aortic arch: Normal variant aortic arch branching pattern with common origin of the brachiocephalic and left common carotid arteries. Widely patent arch vessel origins. Right carotid system: Patent without evidence of stenosis, dissection, or significant atherosclerosis. Left carotid system: Patent without evidence of stenosis, dissection, or significant atherosclerosis. Vertebral arteries: Patent without evidence of stenosis, dissection, or significant atherosclerosis. Codominant. Skeleton: No acute osseous abnormality or suspicious osseous lesion. Other neck: No evidence of cervical lymphadenopathy or mass. Upper chest: Patchy ground-glass opacities in the visualized portions of the left upper and left lower lobes, similar in appearance to yesterday's chest CTA. Small bilateral pleural effusions. Review of the MIP images confirms the above findings CTA HEAD FINDINGS Anterior circulation: The internal carotid arteries are widely patent from skull base to carotid termini. ACAs and MCAs are patent without evidence of a proximal branch occlusion or significant proximal stenosis. No aneurysm is identified. Posterior circulation: The intracranial vertebral arteries are widely patent to the basilar. The left PICA and right AICA appear dominant. Patent SCA is are seen bilaterally. The basilar artery is widely patent. There are left larger than right posterior communicating arteries. Both  PCAs are patent without evidence of a significant proximal stenosis. No aneurysm is identified. Venous sinuses: Patent. Anatomic variants: None. Review of the MIP images confirms the above findings IMPRESSION: 1. No large vessel occlusion or significant stenosis in the head and neck. 2. Patchy ground-glass opacities in the left lung, more fully evaluated on yesterday's chest CTA. Small bilateral pleural effusions. Electronically Signed   By: 13/02/2020 M.D.   On: 09/03/2020 10:24   CT ANGIO NECK W OR WO CONTRAST  Result Date: 09/03/2020 CLINICAL DATA:  Acute left cerebellar and left temporo-occipital infarcts on MRI. EXAM: CT ANGIOGRAPHY HEAD AND NECK TECHNIQUE: Multidetector CT imaging of the head and neck was performed using the standard protocol during bolus administration of intravenous contrast. Multiplanar CT image reconstructions and MIPs were obtained to evaluate the vascular anatomy. Carotid stenosis measurements (when applicable) are obtained utilizing NASCET criteria, using the distal internal carotid diameter as the denominator. CONTRAST:  57mL OMNIPAQUE IOHEXOL 350 MG/ML SOLN COMPARISON:  Chest CTA 09/02/2020. FINDINGS: CTA NECK FINDINGS Aortic arch: Normal variant aortic arch branching pattern with common origin of the brachiocephalic and left common carotid arteries. Widely patent arch vessel origins.  Right carotid system: Patent without evidence of stenosis, dissection, or significant atherosclerosis. Left carotid system: Patent without evidence of stenosis, dissection, or significant atherosclerosis. Vertebral arteries: Patent without evidence of stenosis, dissection, or significant atherosclerosis. Codominant. Skeleton: No acute osseous abnormality or suspicious osseous lesion. Other neck: No evidence of cervical lymphadenopathy or mass. Upper chest: Patchy ground-glass opacities in the visualized portions of the left upper and left lower lobes, similar in appearance to yesterday's chest CTA.  Small bilateral pleural effusions. Review of the MIP images confirms the above findings CTA HEAD FINDINGS Anterior circulation: The internal carotid arteries are widely patent from skull base to carotid termini. ACAs and MCAs are patent without evidence of a proximal branch occlusion or significant proximal stenosis. No aneurysm is identified. Posterior circulation: The intracranial vertebral arteries are widely patent to the basilar. The left PICA and right AICA appear dominant. Patent SCA is are seen bilaterally. The basilar artery is widely patent. There are left larger than right posterior communicating arteries. Both PCAs are patent without evidence of a significant proximal stenosis. No aneurysm is identified. Venous sinuses: Patent. Anatomic variants: None. Review of the MIP images confirms the above findings IMPRESSION: 1. No large vessel occlusion or significant stenosis in the head and neck. 2. Patchy ground-glass opacities in the left lung, more fully evaluated on yesterday's chest CTA. Small bilateral pleural effusions. Electronically Signed   By: Sebastian Ache M.D.   On: 09/03/2020 10:24   CT Angio Chest PE W and/or Wo Contrast  Result Date: 09/02/2020 CLINICAL DATA:  Found unresponsive EXAM: CT ANGIOGRAPHY CHEST WITH CONTRAST TECHNIQUE: Multidetector CT imaging of the chest was performed using the standard protocol during bolus administration of intravenous contrast. Multiplanar CT image reconstructions and MIPs were obtained to evaluate the vascular anatomy. CONTRAST:  78mL OMNIPAQUE IOHEXOL 350 MG/ML SOLN COMPARISON:  None. FINDINGS: Cardiovascular: There is a optimal opacification of the pulmonary arteries. There is no central,segmental, or subsegmental filling defects within the pulmonary arteries. The heart is normal in size. No pericardial effusion or thickening. No evidence right heart strain. There is normal three-vessel brachiocephalic anatomy without proximal stenosis. The thoracic aorta  is normal in appearance. Mediastinum/Nodes: No hilar, mediastinal, or axillary adenopathy. Thyroid gland, trachea, and esophagus demonstrate no significant findings. Lungs/Pleura: Patchy tree-in-bud opacities are seen within the left upper lung and posterior left lung base. There is a small amount tree-in-bud patchy airspace opacities in the posterior left lung base. No pleural effusion or pneumothorax. Upper Abdomen: No acute abnormalities present in the visualized portions of the upper abdomen. Musculoskeletal: No chest wall abnormality. No acute or significant osseous findings. Review of the MIP images confirms the above findings. IMPRESSION: No central, segmental or subsegmental pulmonary embolism. Multifocal patchy airspace opacities which could be due to multifocal pneumonia pulmonary edema, or aspiration. Electronically Signed   By: Jonna Clark M.D.   On: 09/02/2020 15:58   MR BRAIN W WO CONTRAST  Addendum Date: 09/02/2020   ADDENDUM REPORT: 09/02/2020 22:12 ADDENDUM: These results will be called to the ordering clinician or representative by the Radiologist Assistant, and communication documented in the PACS or [employer]. Electronically Signed   By: Stana Bunting M.D.   On: 09/02/2020 22:12   Result Date: 09/02/2020 CLINICAL DATA:  Mental status change, unknown cause EXAM: MRI HEAD WITHOUT AND WITH CONTRAST TECHNIQUE: Multiplanar, multiecho pulse sequences of the brain and surrounding structures were obtained without and with intravenous contrast. CONTRAST:  8.59mL GADAVIST GADOBUTROL 1 MMOL/ML IV SOLN COMPARISON:  05/22/2019 head CT.  08/24/2018 MRI head. FINDINGS: Brain: Multifocal acute infarcts involving the left occipitotemporal region and left cerebellum. No intracranial hemorrhage. No midline shift, ventriculomegaly or extra-axial fluid collection. No mass lesion. No abnormal enhancement. Cerebral volume is within normal limits. Vascular: Normal flow voids. Skull and upper cervical  spine: Normal marrow signal. Sinuses/Orbits: Normal orbits. Right maxillary and sphenoid sinus mucous retention cysts. Clear mastoid air cells. Other: Please note that image quality is mildly degraded by motion artifact. 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 Electronically Signed: By: Stana Bunting M.D. On: 09/02/2020 21:44   ECHOCARDIOGRAM COMPLETE  Result Date: 09/02/2020    ECHOCARDIOGRAM LIMITED REPORT   Patient Name:   [facility] Date of Exam: 09/02/2020 Medical Rec #:  [id number]       Height:       66.0 in Accession #:    [id number]      Weight:       185.0 lb Date of Birth:  09/14/1991       BSA:          1.935 m Patient Age:    29 years        BP:           101/76 mmHg Patient Gender: F               HR:           73 bpm. Exam Location:  Inpatient Procedure: 2D Echo Indications:    AMS, loss of conciousness, sepsis  History:        Patient has no prior history of Echocardiogram examinations.                 CHF.  Sonographer:    Thurman Coyer RDCS (AE) Referring Phys: [id number] Estevan Ryder MCINTYRE IMPRESSIONS  1. Left ventricular ejection fraction, by estimation, is 25 to 30%. The left ventricle has severely decreased function. The left ventricle demonstrates global hypokinesis. Left ventricular diastolic parameters are consistent with Grade I diastolic dysfunction (impaired relaxation).  2. Right ventricular systolic function is moderately reduced. The right ventricular size is mildly enlarged. There is normal pulmonary artery systolic pressure. The estimated right ventricular systolic pressure is 29.0 mmHg.  3. The pericardial effusion is posterior to the left ventricle. There is no evidence of cardiac tamponade.  4. The mitral valve is normal in structure. Moderate mitral valve regurgitation. No evidence of mitral stenosis.  5. Tricuspid valve regurgitation is moderate.  6. The aortic valve is normal in structure. Aortic valve  regurgitation is not visualized. No aortic stenosis is present.  7. The inferior vena cava is dilated in size with <50% respiratory variability, suggesting right atrial pressure of 15 mmHg. FINDINGS  Left Ventricle: Left ventricular ejection fraction, by estimation, is 25 to 30%. The left ventricle has severely decreased function. The left ventricle demonstrates global hypokinesis. The left ventricular internal cavity size was normal in size. There is no left ventricular hypertrophy. Left ventricular diastolic parameters are consistent with Grade I diastolic dysfunction (impaired relaxation). Normal left ventricular filling pressure. Right Ventricle: The right ventricular size is mildly enlarged. No increase in right ventricular wall thickness. Right ventricular systolic function is moderately reduced. There is normal pulmonary artery systolic pressure. The tricuspid regurgitant velocity is 1.87 m/s, and with an assumed right atrial pressure of 15 mmHg, the estimated right ventricular systolic pressure is 29.0 mmHg. Left Atrium: Left atrial size was normal in size. Right Atrium:  Right atrial size was normal in size. Pericardium: Trivial pericardial effusion is present. The pericardial effusion is posterior to the left ventricle. There is no evidence of cardiac tamponade. Mitral Valve: The mitral valve is normal in structure. Moderate mitral valve regurgitation. No evidence of mitral valve stenosis. Tricuspid Valve: The tricuspid valve is normal in structure. Tricuspid valve regurgitation is moderate . No evidence of tricuspid stenosis. Aortic Valve: The aortic valve is normal in structure. Aortic valve regurgitation is not visualized. No aortic stenosis is present. Pulmonic Valve: The pulmonic valve was normal in structure. Pulmonic valve regurgitation is trivial. No evidence of pulmonic stenosis. Aorta: The aortic root is normal in size and structure. Venous: The inferior vena cava is dilated in size with less than  50% respiratory variability, suggesting right atrial pressure of 15 mmHg. IAS/Shunts: No atrial level shunt detected by color flow Doppler. LEFT VENTRICLE PLAX 2D LVIDd:         5.11 cm      Diastology LVIDs:         4.15 cm      LV e' medial:    6.83 cm/s LV PW:         0.80 cm      LV E/e' medial:  6.2 LV IVS:        0.95 cm      LV e' lateral:   8.67 cm/s LVOT diam:     2.40 cm      LV E/e' lateral: 4.9 LV SV:         53 LV SV Index:   28 LVOT Area:     4.52 cm  LV Volumes (MOD) LV vol d, MOD A2C: 106.0 ml LV vol d, MOD A4C: 83.4 ml LV vol s, MOD A2C: 65.9 ml LV vol s, MOD A4C: 45.5 ml LV SV MOD A2C:     40.1 ml LV SV MOD A4C:     83.4 ml LV SV MOD BP:      40.7 ml RIGHT VENTRICLE RV S prime:     8.67 cm/s TAPSE (M-mode): 1.2 cm LEFT ATRIUM             Index       RIGHT ATRIUM           Index LA diam:        3.10 cm 1.60 cm/m  RA Area:     13.60 cm LA Vol (A2C):   50.0 ml 25.84 ml/m RA Volume:   33.60 ml  17.37 ml/m LA Vol (A4C):   25.0 ml 12.92 ml/m LA Biplane Vol: 35.9 ml 18.56 ml/m  AORTIC VALVE LVOT Vmax:   60.60 cm/s LVOT Vmean:  41.900 cm/s LVOT VTI:    0.118 m  AORTA Ao Root diam: 3.00 cm MITRAL VALVE                 TRICUSPID VALVE MV Area (PHT): 4.31 cm      TR Peak grad:   14.0 mmHg MV Decel Time: 176 msec      TR Vmax:        187.00 cm/s MR Peak grad:    69.2 mmHg MR Mean grad:    48.0 mmHg   SHUNTS MR Vmax:         416.00 cm/s Systemic VTI:  0.12 m MR Vmean:        331.0 cm/s  Systemic Diam: 2.40 cm MR PISA:         1.57 cm  MR PISA Eff ROA: 17 mm MR PISA Radius:  0.50 cm MV E velocity: 42.20 cm/s MV A velocity: 38.60 cm/s MV E/A ratio:  1.09 Tobias Alexander MD Electronically signed by Tobias Alexander MD Signature Date/Time: 09/02/2020/5:00:07 PM    Final     Patient Profile     29 y.o. female with a history of migraines, depression, and insomnia now presenting with newly documented cardiomyopathy, suspicion of small embolic strokes based on MRI and pneumonia with sepsis.  Assessment &  Plan    1.  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  High-sensitivity troponin I 223.  ECG does not show any acute ST segment abnormalities.  2.  Multifocal acute infarct involving left occipitotemporal region and also left cerebellum, brain MRI on November 14.  Question embolic source.  TEE is scheduled for November 8.  3.  Left lower lobe pneumonia presenting with sepsis.  Currently afebrile on antibiotics.  Lactic acid has normalized at 1.3.  WBC 12.7.  Continue aspirin and Lovenox.  Will start Coreg 3.125 mg twice daily. She is on prazosin (states that she uses this for depression, not hypertension - may need to be discontinued to allow for additional up titration of cardiomyopathy regimen).  Depending on blood pressure trend may be able to add Entresto and then Aldactone.  Plan for TEE on Monday to assess for cardioembolic source.  Given suspicion for nonischemic cardiomyopathy, cardiac MRI may also be useful.  Signed, Nona Dell, MD  09/04/2020, 11:43 AM

## 2020-09-04 NOTE — Progress Notes (Signed)
STROKE TEAM PROGRESS NOTE   INTERVAL HISTORY No one at bedside. Patient laying in bed talking on the phone. Still mildly lethargic but neuro stable, no neuro deficit at this time. 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 EF 25-30%. Blood culture pending. Plan to do TEE Monday.   Vitals:   09/03/20 2315 09/04/20 0429 09/04/20 0755 09/04/20 1135  BP: (!) 129/95 128/90 (!) 131/92 121/76  Pulse:  68 72 69  Resp: 18 18 19 17   Temp: 98.8 F (37.1 C) 98.9 F (37.2 C) 98.7 F (37.1 C) 99.2 F (37.3 C)  TempSrc: Oral Oral Oral Oral  SpO2: 98% 96% 98% 99%  Weight:  94.4 kg    Height:       CBC:  Recent Labs  Lab 09/02/20 1220 09/02/20 1220 09/03/20 0212 09/04/20 0832  WBC 24.3*   < > 22.1* 12.7*  NEUTROABS 21.5*  --  18.5*  --   HGB 10.9*   < > 9.7* 10.5*  HCT 35.2*   < > 30.4* 32.5*  MCV 92.6   < > 88.6 87.8  PLT 220   < > 204 234   < > = values in this interval not displayed.   Basic Metabolic Panel:  Recent Labs  Lab 09/03/20 0212 09/04/20 0832  NA 135 138  K 4.2 3.9  CL 103 104  CO2 24 26  GLUCOSE 85 125*  BUN 14 7  CREATININE 1.06* 1.11*  CALCIUM 8.3* 8.7*   Lipid Panel:  Recent Labs  Lab 09/03/20 0212  CHOL 95  TRIG 35  HDL 39*  CHOLHDL 2.4  VLDL 7  LDLCALC 49   HgbA1c:  Recent Labs  Lab 09/03/20 0212  HGBA1C 5.8*   Urine Drug Screen:  Recent Labs  Lab 09/02/20 1034  LABOPIA POSITIVE*  COCAINSCRNUR NONE DETECTED  LABBENZ NONE DETECTED  AMPHETMU NONE DETECTED  THCU NONE DETECTED  LABBARB NONE DETECTED    Alcohol Level  Recent Labs  Lab 09/02/20 1040  ETH <10    IMAGING past 24 hours No results found.  PHYSICAL EXAM  Temp:  [98.3 F (36.8 C)-99.2 F (37.3 C)] 99.2 F (37.3 C) (11/06 1135) Pulse Rate:  [63-75] 69 (11/06 1135) Resp:  [17-19] 17 (11/06 1135) BP: (121-136)/(76-95) 121/76 (11/06 1135) SpO2:  [92 %-99 %] 99 % (11/06 1135) Weight:  [94.4 kg] 94.4 kg (11/06 0429)  General - Well nourished, well  developed, in no apparent distress, mildly lethargic.  Ophthalmologic - fundi not visualized due to noncooperation.  Cardiovascular - Regular rhythm and rate.  Mental Status -  Level of arousal and orientation to time, place, and person were intact. Language including expression, naming, repetition, comprehension was assessed and found intact. Fund of Knowledge was assessed and was intact.  Cranial Nerves II - XII - II - Visual field intact OU. III, IV, VI - Extraocular movements intact. V - Facial sensation intact bilaterally. VII - Facial movement intact bilaterally. VIII - Hearing & vestibular intact bilaterally. X - Palate elevates symmetrically. XI - Chin turning & shoulder shrug intact bilaterally. XII - Tongue protrusion intact.  Motor Strength - The patient's strength was normal in all extremities and pronator drift was absent.  Bulk was normal and fasciculations were absent.   Motor Tone - Muscle tone was assessed at the neck and appendages and was normal.  Reflexes - The patient's reflexes were symmetrical in all extremities and she had no pathological reflexes.  Sensory - Light touch, temperature/pinprick  were assessed and were symmetrical.    Coordination - The patient had normal movements in the hands and feet with no ataxia or dysmetria.  Tremor was absent.  Gait and Station - deferred.   ASSESSMENT/PLAN [name] is a 29 y.o. female with history of migraines, depression and IBS w/ root canal the day prior found unresponsive in her bed, incontinent w/ tongue bite. MRI done as part of workup shows acute infarcts.   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 Patchy LLL ground-glass opacities  2D Echo EF 25-30%. No source of  embolus   EEG no sz  TEE pending 11/8  LDL 49  HgbA1c 5.8  Hypercoagulable work up pending  VTE prophylaxis - Lovenox 40 mg sq daily   No antithrombotic prior to admission, now on aspirin 81 mg daily.   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   EF 25-30%  TEE pending Monday   ? Substance abuse  UDS opiates POSITIVE   Pt on the phone with family, counseling not done yet  ? Tyl #3 following root canal the day prior  Responded to narcan when administered by EMS  Tobacco abuse  Current smoker  Smoking cessation counseling provided  Pt is willing to quit     Other Stroke Risk Factors  ALCOHOL use, advised to drink no more than 1 drink per day  Obesity, Body mass index is 33.59 kg/m., BMI >/= 30 associated with increased stroke risk, recommend weight loss, diet and exercise as appropriate   Migraines on Aimovig - following with Dr. Lucia Gaskins - follow outpatient  Other Active Problems  LLL PNA/septic shock  Leukocytosis WBC 22.1 -? 12.7  Depression  IBS  Transaminitis AST/ALT 70/56 -> 74/85  AKI, Cre 1.96->1.06  Anemia 10.5/32.5  Hospital day # 1   Personally examined patient and images, and have participated in and made any corrections needed to history, physical, neuro exam,assessment and plan as stated above.  I have personally obtained the history, evaluated lab date, reviewed imaging studies and agree with radiology interpretations.    Naomie Dean, MD Stroke Neurology  I spent 25 minutes of face-to-face and non-face-to-face time with patient. This included prechart review, lab review, study review, order entry, electronic health record documentation, patient education on the different diagnostic and therapeutic options, counseling and  coordination of care, risks and benefits of management, compliance, or risk factor reduction   To contact Stroke Continuity provider, please refer to [email or web address]. After hours, contact General Neurology

## 2020-09-05 DIAGNOSIS — A419 Sepsis, unspecified organism: Secondary | ICD-10-CM | POA: Diagnosis not present

## 2020-09-05 DIAGNOSIS — I639 Cerebral infarction, unspecified: Secondary | ICD-10-CM | POA: Diagnosis not present

## 2020-09-05 DIAGNOSIS — R6520 Severe sepsis without septic shock: Secondary | ICD-10-CM | POA: Diagnosis not present

## 2020-09-05 DIAGNOSIS — I429 Cardiomyopathy, unspecified: Secondary | ICD-10-CM | POA: Diagnosis not present

## 2020-09-05 DIAGNOSIS — N179 Acute kidney failure, unspecified: Secondary | ICD-10-CM

## 2020-09-05 DIAGNOSIS — J690 Pneumonitis due to inhalation of food and vomit: Secondary | ICD-10-CM | POA: Diagnosis not present

## 2020-09-05 LAB — COMPREHENSIVE METABOLIC PANEL
ALT: 69 U/L — ABNORMAL HIGH (ref 0–44)
AST: 44 U/L — ABNORMAL HIGH (ref 15–41)
Albumin: 2.9 g/dL — ABNORMAL LOW (ref 3.5–5.0)
Alkaline Phosphatase: 46 U/L (ref 38–126)
Anion gap: 8 (ref 5–15)
BUN: 6 mg/dL (ref 6–20)
CO2: 25 mmol/L (ref 22–32)
Calcium: 8.8 mg/dL — ABNORMAL LOW (ref 8.9–10.3)
Chloride: 106 mmol/L (ref 98–111)
Creatinine, Ser: 1.03 mg/dL — ABNORMAL HIGH (ref 0.44–1.00)
GFR, Estimated: >60 mL/min (ref >60)
Glucose, Bld: 77 mg/dL (ref 70–99)
Potassium: 3.5 mmol/L (ref 3.5–5.1)
Sodium: 139 mmol/L (ref 135–145)
Total Bilirubin: 0.6 mg/dL (ref 0.3–1.2)
Total Protein: 5.7 g/dL — ABNORMAL LOW (ref 6.5–8.1)

## 2020-09-05 LAB — BETA-2-GLYCOPROTEIN I ABS, IGG/M/A
Beta-2 Glyco I IgG: <9 GPI IgG units (ref 0–20)
Beta-2-Glycoprotein I IgA: <9 GPI IgA units (ref 0–25)
Beta-2-Glycoprotein I IgM: <9 GPI IgM units (ref 0–32)

## 2020-09-05 LAB — CBC
HCT: 33.4 % — ABNORMAL LOW (ref 36.0–46.0)
Hemoglobin: 11.1 g/dL — ABNORMAL LOW (ref 12.0–15.0)
MCH: 28.7 pg (ref 26.0–34.0)
MCHC: 33.2 g/dL (ref 30.0–36.0)
MCV: 86.3 fL (ref 80.0–100.0)
Platelets: 243 10*3/uL (ref 150–400)
RBC: 3.87 MIL/uL (ref 3.87–5.11)
RDW: 14.4 % (ref 11.5–15.5)
WBC: 9.7 10*3/uL (ref 4.0–10.5)
nRBC: 0 % (ref 0.0–0.2)

## 2020-09-05 NOTE — Progress Notes (Signed)
STROKE TEAM PROGRESS NOTE   INTERVAL HISTORY No one at bedside. Patient more alert and appears better and feels beter today  no neuro deficit at this time. 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 EF 25-30%. Blood culture pending. Plan to do TEE Monday.   Vitals:   09/04/20 2118 09/05/20 0007 09/05/20 0500 09/05/20 0743  BP:  132/86  119/79  Pulse:  86  80  Resp:  20  16  Temp: 98.1 F (36.7 C) 98 F (36.7 C)  98.2 F (36.8 C)  TempSrc: Oral Oral  Oral  SpO2:  99%  99%  Weight:   82.1 kg   Height:       CBC:  Recent Labs  Lab 09/02/20 1220 09/02/20 1220 09/03/20 0212 09/03/20 0212 09/04/20 0832 09/05/20 0450  WBC 24.3*   < > 22.1*   < > 12.7* 9.7  NEUTROABS 21.5*  --  18.5*  --   --   --   HGB 10.9*   < > 9.7*   < > 10.5* 11.1*  HCT 35.2*   < > 30.4*   < > 32.5* 33.4*  MCV 92.6   < > 88.6   < > 87.8 86.3  PLT 220   < > 204   < > 234 243   < > = values in this interval not displayed.   Basic Metabolic Panel:  Recent Labs  Lab 09/04/20 0832 09/05/20 0450  NA 138 139  K 3.9 3.5  CL 104 106  CO2 26 25  GLUCOSE 125* 77  BUN 7 6  CREATININE 1.11* 1.03*  CALCIUM 8.7* 8.8*   Lipid Panel:  Recent Labs  Lab 09/03/20 0212  CHOL 95  TRIG 35  HDL 39*  CHOLHDL 2.4  VLDL 7  LDLCALC 49   HgbA1c:  Recent Labs  Lab 09/03/20 0212  HGBA1C 5.8*   Urine Drug Screen:  Recent Labs  Lab 09/02/20 1034  LABOPIA POSITIVE*  COCAINSCRNUR NONE DETECTED  LABBENZ NONE DETECTED  AMPHETMU NONE DETECTED  THCU NONE DETECTED  LABBARB NONE DETECTED    Alcohol Level  Recent Labs  Lab 09/02/20 1040  ETH <10    IMAGING past 24 hours No results found.  PHYSICAL EXAM  Temp:  [98 F (36.7 C)-99.4 F (37.4 C)] 98.2 F (36.8 C) (11/07 0743) Pulse Rate:  [72-86] 80 (11/07 0743) Resp:  [16-20] 16 (11/07 0743) BP: (119-132)/(77-86) 119/79 (11/07 0743) SpO2:  [99 %-100 %] 99 % (11/07 0743) Weight:  [82.1 kg] 82.1 kg (11/07 0500)  General - Well  nourished, well developed, in no apparent distress, mildly lethargic.  Ophthalmologic - fundi not visualized due to noncooperation.  Cardiovascular - Regular rhythm and rate.  Mental Status -  Level of arousal and orientation to time, place, and person were intact. Language including expression, naming, repetition, comprehension was assessed and found intact. Fund of Knowledge was assessed and was intact.  Cranial Nerves II - XII - II - Visual field intact OU. III, IV, VI - Extraocular movements intact. V - Facial sensation intact bilaterally. VII - Facial movement intact bilaterally. VIII - Hearing & vestibular intact bilaterally. X - Palate elevates symmetrically. XI - Chin turning & shoulder shrug intact bilaterally. XII - Tongue protrusion intact.  Motor Strength - The patient's strength was normal in all extremities and pronator drift was absent.  Bulk was normal and fasciculations were absent.   Motor Tone - Muscle tone was assessed at the neck and  appendages and was normal.  Reflexes - The patient's reflexes were symmetrical in all extremities and she had no pathological reflexes.  Sensory - Light touch, temperature/pinprick were assessed and were symmetrical.    Coordination - The patient had normal movements in the hands and feet with no ataxia or dysmetria.  Tremor was absent.  Gait and Station - deferred.   ASSESSMENT/PLAN [name] is a 29 y.o. female with history of migraines, depression and IBS w/ root canal the day prior found unresponsive in her bed, incontinent w/ tongue bite. MRI done as part of workup shows acute infarcts.   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 Patchy LLL ground-glass opacities  2D Echo EF 25-30%.  No source of embolus   EEG no sz  TEE pending 11/8 (NPO order written) Cardiology following.  LDL 49  HgbA1c 5.8  Hypercoagulable work up pending - cardiolipin antibodies - IGG - WNL ; IGA - WNL ; IGM elevated - 19 (H) (0 - 12)  VTE prophylaxis - Lovenox 40 mg sq daily   No antithrombotic prior to admission, now on aspirin 81 mg daily.   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   EF 25-30%  TEE pending Monday (NPO order written) Cardiology following.  ? Substance abuse  UDS opiates POSITIVE   Pt on the phone with family, counseling not done yet  ? Tyl #3 following root canal the day prior  Responded to narcan when administered by EMS  Tobacco abuse  Current smoker  Smoking cessation counseling provided  Pt is willing to quit     Other Stroke Risk Factors  ALCOHOL use, advised to drink no more than 1 drink per day  Obesity, Body mass index is 29.23 kg/m., BMI >/= 30 associated with increased stroke risk, recommend weight loss, diet and exercise as appropriate   Migraines on Aimovig - following with Dr. Lucia Gaskins - follow outpatient  Other Active Problems  LLL PNA/septic shock (afebrile)  Leukocytosis WBC 22.1 ->12.7->9.7  Depression  IBS  Transaminitis AST/ALT 70/56 -> 74/85 -> 44/69 improving  AKI, Cre 1.96->1.06->1.03  Anemia 10.5/32.5->11.1  Hospital day # 2   Personally examined patient and images, and have participated in and made any corrections needed to history, physical, neuro exam,assessment and plan as stated above.  I have personally obtained the history, evaluated lab date, reviewed imaging studies and agree with radiology interpretations.    Naomie Dean, MD Stroke Neurology  I spent 25 minutes of face-to-face  and non-face-to-face time with patient. This included prechart review, lab review, study review, order entry, electronic health record documentation, patient education on the different diagnostic and therapeutic options, counseling and coordination of care, risks and benefits of management, compliance, or risk factor reduction    To contact Stroke Continuity provider, please refer to [email or web address]. After hours, contact General Neurology

## 2020-09-05 NOTE — Progress Notes (Signed)
Progress Note  Patient Name: Tabitha Smith Date of Encounter: 09/05/2020  Primary Cardiologist: Little Ishikawa, MD  Subjective   No chest pain, breathing more easily.   Inpatient Medications    Scheduled Meds: . aspirin EC  81 mg Oral Daily  . carvedilol  3.125 mg Oral BID WC  . DULoxetine  60 mg Oral Daily  . enoxaparin (LOVENOX) injection  40 mg Subcutaneous Q24H  . levofloxacin  750 mg Oral Daily  . mouth rinse  15 mL Mouth Rinse BID  . prazosin  1 mg Oral BID  . QUEtiapine  50 mg Oral QHS    PRN Meds: ondansetron (ZOFRAN) IV   Vital Signs    Vitals:   09/04/20 2118 09/05/20 0007 09/05/20 0500 09/05/20 0743  BP:  132/86  119/79  Pulse:  86  80  Resp:  20  16  Temp: 98.1 F (36.7 C) 98 F (36.7 C)  98.2 F (36.8 C)  TempSrc: Oral Oral  Oral  SpO2:  99%  99%  Weight:   82.1 kg   Height:       No intake or output data in the 24 hours ending 09/05/20 0932 Filed Weights   09/03/20 1052 09/04/20 0429 09/05/20 0500  Weight: 97.2 kg 94.4 kg 82.1 kg    Telemetry    Sinus rhythm.  Personally reviewed.  ECG    An ECG dated 09/03/2020 was personally reviewed today and demonstrated:  Sinus tachycardia with leftward axis.  Physical Exam   GEN: No acute distress.   Neck: No JVD. Cardiac: RRR, no gallop or rub.  Respiratory: Nonlabored. Clear to auscultation bilaterally. GI: Soft, nontender, bowel sounds present. MS: No edema.  Labs    Chemistry Recent Labs  Lab 09/03/20 0212 09/04/20 0832 09/05/20 0450  NA 135 138 139  K 4.2 3.9 3.5  CL 103 104 106  CO2 24 26 25   GLUCOSE 85 125* 77  BUN 14 7 6   CREATININE 1.06* 1.11* 1.03*  CALCIUM 8.3* 8.7* 8.8*  PROT 5.5* 6.0* 5.7*  ALBUMIN 3.0* 3.2* 2.9*  AST 70* 74* 44*  ALT 56* 85* 69*  ALKPHOS 44 46 46  BILITOT 0.3 0.2* 0.6  GFRNONAA >60 >60 >60  ANIONGAP 8 8 8      Hematology Recent Labs  Lab 09/03/20 0212 09/04/20 0832 09/05/20 0450  WBC 22.1* 12.7* 9.7  RBC 3.43* 3.70* 3.87    HGB 9.7* 10.5* 11.1*  HCT 30.4* 32.5* 33.4*  MCV 88.6 87.8 86.3  MCH 28.3 28.4 28.7  MCHC 31.9 32.3 33.2  RDW 14.5 14.6 14.4  PLT 204 234 243    Cardiac Enzymes Recent Labs  Lab 09/02/20 1026 09/02/20 1557  TROPONINIHS 205* 223*    Radiology    EEG  Result Date: 09/03/2020 13/04/21, MD     09/03/2020  1:12 PM Patient Name: Maddyn Lieurance MRN: [facility] Attending: 13/02/2020 Referring Physician/Provider: Dr [name] Date: 09/03/2020 Duration: 27.24 mins Patient history: 29 y.o. female with AMS. 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 EEG to evaluate for seizure. Level of alertness: Awake, asleep AEDs during EEG study: None Technical aspects: This EEG study was done with scalp electrodes positioned according to the 10-20 International system of electrode placement. Electrical activity was acquired at a sampling rate of 500Hz  and reviewed with a high frequency filter of 70Hz  and a low frequency filter of 1Hz . EEG data were recorded continuously and  digitally stored. Description: The posterior dominant rhythm consists of 8-9 Hz activity of moderate voltage (25-35 uV) seen predominantly in posterior head regions, symmetric and reactive to eye opening and eye closing. Sleep was characterized by vertex waves, sleep spindles (12 to 14 Hz), maximal frontocentral region.   Hyperventilation and photic stimulation were not performed.   IMPRESSION: This study is within normal limits. No seizures or epileptiform discharges were seen throughout the recording. Priyanka Annabelle Harman   CT ANGIO HEAD W OR WO CONTRAST  Result Date: 09/03/2020 CLINICAL DATA:  Acute left cerebellar and left temporo-occipital infarcts on MRI. EXAM: CT ANGIOGRAPHY HEAD AND NECK TECHNIQUE: Multidetector CT imaging of the head and neck was performed using the standard protocol during bolus administration of intravenous contrast. Multiplanar CT image  reconstructions and MIPs were obtained to evaluate the vascular anatomy. Carotid stenosis measurements (when applicable) are obtained utilizing NASCET criteria, using the distal internal carotid diameter as the denominator. CONTRAST:  25mL OMNIPAQUE IOHEXOL 350 MG/ML SOLN COMPARISON:  Chest CTA 09/02/2020. FINDINGS: CTA NECK FINDINGS Aortic arch: Normal variant aortic arch branching pattern with common origin of the brachiocephalic and left common carotid arteries. Widely patent arch vessel origins. Right carotid system: Patent without evidence of stenosis, dissection, or significant atherosclerosis. Left carotid system: Patent without evidence of stenosis, dissection, or significant atherosclerosis. Vertebral arteries: Patent without evidence of stenosis, dissection, or significant atherosclerosis. Codominant. Skeleton: No acute osseous abnormality or suspicious osseous lesion. Other neck: No evidence of cervical lymphadenopathy or mass. Upper chest: Patchy ground-glass opacities in the visualized portions of the left upper and left lower lobes, similar in appearance to yesterday's chest CTA. Small bilateral pleural effusions. Review of the MIP images confirms the above findings CTA HEAD FINDINGS Anterior circulation: The internal carotid arteries are widely patent from skull base to carotid termini. ACAs and MCAs are patent without evidence of a proximal branch occlusion or significant proximal stenosis. No aneurysm is identified. Posterior circulation: The intracranial vertebral arteries are widely patent to the basilar. The left PICA and right AICA appear dominant. Patent SCA is are seen bilaterally. The basilar artery is widely patent. There are left larger than right posterior communicating arteries. Both PCAs are patent without evidence of a significant proximal stenosis. No aneurysm is identified. Venous sinuses: Patent. Anatomic variants: None. Review of the MIP images confirms the above findings  IMPRESSION: 1. No large vessel occlusion or significant stenosis in the head and neck. 2. Patchy ground-glass opacities in the left lung, more fully evaluated on yesterday's chest CTA. Small bilateral pleural effusions. Electronically Signed   By: Sebastian Ache M.D.   On: 09/03/2020 10:24   CT ANGIO NECK W OR WO CONTRAST  Result Date: 09/03/2020 CLINICAL DATA:  Acute left cerebellar and left temporo-occipital infarcts on MRI. EXAM: CT ANGIOGRAPHY HEAD AND NECK TECHNIQUE: Multidetector CT imaging of the head and neck was performed using the standard protocol during bolus administration of intravenous contrast. Multiplanar CT image reconstructions and MIPs were obtained to evaluate the vascular anatomy. Carotid stenosis measurements (when applicable) are obtained utilizing NASCET criteria, using the distal internal carotid diameter as the denominator. CONTRAST:  21mL OMNIPAQUE IOHEXOL 350 MG/ML SOLN COMPARISON:  Chest CTA 09/02/2020. FINDINGS: CTA NECK FINDINGS Aortic arch: Normal variant aortic arch branching pattern with common origin of the brachiocephalic and left common carotid arteries. Widely patent arch vessel origins. Right carotid system: Patent without evidence of stenosis, dissection, or significant atherosclerosis. Left carotid system: Patent without evidence of stenosis, dissection, or significant  atherosclerosis. Vertebral arteries: Patent without evidence of stenosis, dissection, or significant atherosclerosis. Codominant. Skeleton: No acute osseous abnormality or suspicious osseous lesion. Other neck: No evidence of cervical lymphadenopathy or mass. Upper chest: Patchy ground-glass opacities in the visualized portions of the left upper and left lower lobes, similar in appearance to yesterday's chest CTA. Small bilateral pleural effusions. Review of the MIP images confirms the above findings CTA HEAD FINDINGS Anterior circulation: The internal carotid arteries are widely patent from skull base to  carotid termini. ACAs and MCAs are patent without evidence of a proximal branch occlusion or significant proximal stenosis. No aneurysm is identified. Posterior circulation: The intracranial vertebral arteries are widely patent to the basilar. The left PICA and right AICA appear dominant. Patent SCA is are seen bilaterally. The basilar artery is widely patent. There are left larger than right posterior communicating arteries. Both PCAs are patent without evidence of a significant proximal stenosis. No aneurysm is identified. Venous sinuses: Patent. Anatomic variants: None. Review of the MIP images confirms the above findings IMPRESSION: 1. No large vessel occlusion or significant stenosis in the head and neck. 2. Patchy ground-glass opacities in the left lung, more fully evaluated on yesterday's chest CTA. Small bilateral pleural effusions. Electronically Signed   By: Sebastian Ache M.D.   On: 09/03/2020 10:24    Patient Profile     29 y.o. female with a history of migraines, depression, and insomnia now presenting with newly documented cardiomyopathy, suspicion of small embolic strokes based on MRI and pneumonia with sepsis.  Assessment & Plan    1.  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  High-sensitivity troponin I 223.  ECG does not show any acute ST segment abnormalities. No chest pain and hemodynamically stable.  2.  Multifocal acute infarct involving left occipitotemporal region and also left cerebellum, brain MRI on November 14.  Question embolic source.  TEE is scheduled for November 8.  3.  Left lower lobe pneumonia presenting with sepsis.  Currently afebrile on antibiotics.  Lactic acid has normalized at 1.3.  WBC 12.7. Remains afebrile.  Scheduled for TEE tomorrow. Tolerating Coreg which was started yesterday. Continue ASA and Lovenox. Depending on TEE results, if biventricular function persists  would likely transition from prazosin to [facility], then add Aldactone. Hopefully no intracardiac source of embolus will be identified. Cardiac MRI may also be of utility depending on clinical course.  Signed, Nona Dell, MD  09/05/2020, 9:32 AM

## 2020-09-05 NOTE — H&P (View-Only) (Signed)
 Progress Note  Patient Name: Tabitha Smith Date of Encounter: 09/05/2020  Primary Cardiologist: Christopher L Schumann, MD  Subjective   No chest pain, breathing more easily.   Inpatient Medications    Scheduled Meds: . aspirin EC  81 mg Oral Daily  . carvedilol  3.125 mg Oral BID WC  . DULoxetine  60 mg Oral Daily  . enoxaparin (LOVENOX) injection  40 mg Subcutaneous Q24H  . levofloxacin  750 mg Oral Daily  . mouth rinse  15 mL Mouth Rinse BID  . prazosin  1 mg Oral BID  . QUEtiapine  50 mg Oral QHS    PRN Meds: ondansetron (ZOFRAN) IV   Vital Signs    Vitals:   09/04/20 2118 09/05/20 0007 09/05/20 0500 09/05/20 0743  BP:  132/86  119/79  Pulse:  86  80  Resp:  20  16  Temp: 98.1 F (36.7 C) 98 F (36.7 C)  98.2 F (36.8 C)  TempSrc: Oral Oral  Oral  SpO2:  99%  99%  Weight:   82.1 kg   Height:       No intake or output data in the 24 hours ending 09/05/20 0932 Filed Weights   09/03/20 1052 09/04/20 0429 09/05/20 0500  Weight: 97.2 kg 94.4 kg 82.1 kg    Telemetry    Sinus rhythm.  Personally reviewed.  ECG    An ECG dated 09/03/2020 was personally reviewed today and demonstrated:  Sinus tachycardia with leftward axis.  Physical Exam   GEN: No acute distress.   Neck: No JVD. Cardiac: RRR, no gallop or rub.  Respiratory: Nonlabored. Clear to auscultation bilaterally. GI: Soft, nontender, bowel sounds present. MS: No edema.  Labs    Chemistry Recent Labs  Lab 09/03/20 0212 09/04/20 0832 09/05/20 0450  NA 135 138 139  K 4.2 3.9 3.5  CL 103 104 106  CO2 24 26 25  GLUCOSE 85 125* 77  BUN 14 7 6  CREATININE 1.06* 1.11* 1.03*  CALCIUM 8.3* 8.7* 8.8*  PROT 5.5* 6.0* 5.7*  ALBUMIN 3.0* 3.2* 2.9*  AST 70* 74* 44*  ALT 56* 85* 69*  ALKPHOS 44 46 46  BILITOT 0.3 0.2* 0.6  GFRNONAA >60 >60 >60  ANIONGAP 8 8 8     Hematology Recent Labs  Lab 09/03/20 0212 09/04/20 0832 09/05/20 0450  WBC 22.1* 12.7* 9.7  RBC 3.43* 3.70* 3.87    HGB 9.7* 10.5* 11.1*  HCT 30.4* 32.5* 33.4*  MCV 88.6 87.8 86.3  MCH 28.3 28.4 28.7  MCHC 31.9 32.3 33.2  RDW 14.5 14.6 14.4  PLT 204 234 243    Cardiac Enzymes Recent Labs  Lab 09/02/20 1026 09/02/20 1557  TROPONINIHS 205* 223*    Radiology    EEG  Result Date: 09/03/2020 Smith, Tabitha O, MD     09/03/2020  1:12 PM Patient Name: Tabitha Smith MRN: [id number] Epilepsy Attending: Priyanka O Smith Referring Physician/Provider: Dr Rachel Kim Date: 09/03/2020 Duration: 27.24 mins Patient history: 29 y.o. female with AMS. 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 EEG to evaluate for seizure. Level of alertness: Awake, asleep AEDs during EEG study: None Technical aspects: This EEG study was done with scalp electrodes positioned according to the 10-20 International system of electrode placement. Electrical activity was acquired at a sampling rate of 500Hz and reviewed with a high frequency filter of 70Hz and a low frequency filter of 1Hz. EEG data were recorded continuously and   digitally stored. Description: The posterior dominant rhythm consists of 8-9 Hz activity of moderate voltage (25-35 uV) seen predominantly in posterior head regions, symmetric and reactive to eye opening and eye closing. Sleep was characterized by vertex waves, sleep spindles (12 to 14 Hz), maximal frontocentral region.   Hyperventilation and photic stimulation were not performed.   IMPRESSION: This study is within normal limits. No seizures or epileptiform discharges were seen throughout the recording. Tabitha Smith   CT ANGIO HEAD W OR WO CONTRAST  Result Date: 09/03/2020 CLINICAL DATA:  Acute left cerebellar and left temporo-occipital infarcts on MRI. EXAM: CT ANGIOGRAPHY HEAD AND NECK TECHNIQUE: Multidetector CT imaging of the head and neck was performed using the standard protocol during bolus administration of intravenous contrast. Multiplanar CT image  reconstructions and MIPs were obtained to evaluate the vascular anatomy. Carotid stenosis measurements (when applicable) are obtained utilizing NASCET criteria, using the distal internal carotid diameter as the denominator. CONTRAST:  75mL OMNIPAQUE IOHEXOL 350 MG/ML SOLN COMPARISON:  Chest CTA 09/02/2020. FINDINGS: CTA NECK FINDINGS Aortic arch: Normal variant aortic arch branching pattern with common origin of the brachiocephalic and left common carotid arteries. Widely patent arch vessel origins. Right carotid system: Patent without evidence of stenosis, dissection, or significant atherosclerosis. Left carotid system: Patent without evidence of stenosis, dissection, or significant atherosclerosis. Vertebral arteries: Patent without evidence of stenosis, dissection, or significant atherosclerosis. Codominant. Skeleton: No acute osseous abnormality or suspicious osseous lesion. Other neck: No evidence of cervical lymphadenopathy or mass. Upper chest: Patchy ground-glass opacities in the visualized portions of the left upper and left lower lobes, similar in appearance to yesterday's chest CTA. Small bilateral pleural effusions. Review of the MIP images confirms the above findings CTA HEAD FINDINGS Anterior circulation: The internal carotid arteries are widely patent from skull base to carotid termini. ACAs and MCAs are patent without evidence of a proximal branch occlusion or significant proximal stenosis. No aneurysm is identified. Posterior circulation: The intracranial vertebral arteries are widely patent to the basilar. The left PICA and right AICA appear dominant. Patent SCA is are seen bilaterally. The basilar artery is widely patent. There are left larger than right posterior communicating arteries. Both PCAs are patent without evidence of a significant proximal stenosis. No aneurysm is identified. Venous sinuses: Patent. Anatomic variants: None. Review of the MIP images confirms the above findings  IMPRESSION: 1. No large vessel occlusion or significant stenosis in the head and neck. 2. Patchy ground-glass opacities in the left lung, more fully evaluated on yesterday's chest CTA. Small bilateral pleural effusions. Electronically Signed   By: Allen  Grady M.D.   On: 09/03/2020 10:24   CT ANGIO NECK W OR WO CONTRAST  Result Date: 09/03/2020 CLINICAL DATA:  Acute left cerebellar and left temporo-occipital infarcts on MRI. EXAM: CT ANGIOGRAPHY HEAD AND NECK TECHNIQUE: Multidetector CT imaging of the head and neck was performed using the standard protocol during bolus administration of intravenous contrast. Multiplanar CT image reconstructions and MIPs were obtained to evaluate the vascular anatomy. Carotid stenosis measurements (when applicable) are obtained utilizing NASCET criteria, using the distal internal carotid diameter as the denominator. CONTRAST:  75mL OMNIPAQUE IOHEXOL 350 MG/ML SOLN COMPARISON:  Chest CTA 09/02/2020. FINDINGS: CTA NECK FINDINGS Aortic arch: Normal variant aortic arch branching pattern with common origin of the brachiocephalic and left common carotid arteries. Widely patent arch vessel origins. Right carotid system: Patent without evidence of stenosis, dissection, or significant atherosclerosis. Left carotid system: Patent without evidence of stenosis, dissection, or significant   atherosclerosis. Vertebral arteries: Patent without evidence of stenosis, dissection, or significant atherosclerosis. Codominant. Skeleton: No acute osseous abnormality or suspicious osseous lesion. Other neck: No evidence of cervical lymphadenopathy or mass. Upper chest: Patchy ground-glass opacities in the visualized portions of the left upper and left lower lobes, similar in appearance to yesterday's chest CTA. Small bilateral pleural effusions. Review of the MIP images confirms the above findings CTA HEAD FINDINGS Anterior circulation: The internal carotid arteries are widely patent from skull base to  carotid termini. ACAs and MCAs are patent without evidence of a proximal branch occlusion or significant proximal stenosis. No aneurysm is identified. Posterior circulation: The intracranial vertebral arteries are widely patent to the basilar. The left PICA and right AICA appear dominant. Patent SCA is are seen bilaterally. The basilar artery is widely patent. There are left larger than right posterior communicating arteries. Both PCAs are patent without evidence of a significant proximal stenosis. No aneurysm is identified. Venous sinuses: Patent. Anatomic variants: None. Review of the MIP images confirms the above findings IMPRESSION: 1. No large vessel occlusion or significant stenosis in the head and neck. 2. Patchy ground-glass opacities in the left lung, more fully evaluated on yesterday's chest CTA. Small bilateral pleural effusions. Electronically Signed   By: Allen  Grady M.D.   On: 09/03/2020 10:24    Patient Profile     29 y.o. female with a history of migraines, depression, and insomnia now presenting with newly documented cardiomyopathy, suspicion of small embolic strokes based on MRI and pneumonia with sepsis.  Assessment & Plan    1.  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  High-sensitivity troponin I 223.  ECG does not show any acute ST segment abnormalities. No chest pain and hemodynamically stable.  2.  Multifocal acute infarct involving left occipitotemporal region and also left cerebellum, brain MRI on November 14.  Question embolic source.  TEE is scheduled for November 8.  3.  Left lower lobe pneumonia presenting with sepsis.  Currently afebrile on antibiotics.  Lactic acid has normalized at 1.3.  WBC 12.7. Remains afebrile.  Scheduled for TEE tomorrow. Tolerating Coreg which was started yesterday. Continue ASA and Lovenox. Depending on TEE results, if biventricular function persists  would likely transition from prazosin to Entresto, then add Aldactone. Hopefully no intracardiac source of embolus will be identified. Cardiac MRI may also be of utility depending on clinical course.  Signed, Ajdin Macke, MD  09/05/2020, 9:32 AM   

## 2020-09-05 NOTE — Progress Notes (Addendum)
Family Medicine Teaching Service Daily Progress Note Intern Pager: [phone]  Patient name: Tabitha Smith Medical record number: [id number] Date of birth: [birth year] Age: 29 y.o. Gender: female  Primary Care Provider: Carmel Sacramento, NP Consultants: Neurology, Cardiology Code Status: FULL  Pt Overview and Major Events to Date:   11/4: admitted, neuro and cards consulted  MRI showed multifocal infarcts  Echo with EF 25-30%  Assessment and Plan:  Tabitha Smith is a 28 y.o female who presented after being found unresponsive. PMHx significant for chronic migraines, depression, hx of SI, and IBS.  Multifocal Acute Infarcts of L occipitotemporal region and L cerebellum Neuro exam continues to be normal.  Thought to be embolic but from unknown source at this point. Blood cx no growth at 2 days. -Neuro following, appreciate recommendations -Awaiting TEE on 11/8 -Hypercoag workup pending -Continue ASA 81mg  daily -PT/OT  New onset acute HF with reduced EF, 25-30%  Elevated Troponins Echo on admission showed EF 25-30% with global hypokinesis of LV. Unclear etiology although thought to be sepsis/stress induced cardiomyopathy per cardiology. -Cardiology consult, appreciate recommendations -Plan for TEE 11/8 -Per cardiology continue aspirin and Lovenox, start Coreg 3.125 mg twice daily, prazosin may need to be discontinued to allow for up titration of cardiomyopathy regimen.  There is also the consideration per cardiology of adding Entresto and then Aldactone for blood pressure support if needed.  Cardiology also consider cardiac MRI as a possibly useful imaging modality for suspicion of a nonischemic cardiomyopathy. -Plan for repeat echo when acute illness resolves  Sepsis due to Aspiration vs. Community Acquired Pneumonia Continues to improve.  Satting 99% on room air.  No fevers. WBC 9.7 today (from 12.7 11/6). BP has been stable in 121-132 range systolic. -Continue Levaquin, day  4  Transaminitis-improving Patient's LFTs remain slightly elevated AST 74, ALT 85 on 11/6, improved to AST 44, ALT 69. Possibly due to hypoperfusion during initial unresponsive/hypotensive event. -Daily CMP  AKI, resolved Patient presented with Cr 1.96, thought to be pre-renal in the setting of hypotension. Resolved with fluid resuscitation. Cr 1.03 this morning. -Daily CMP  Depression, well-controlled  hx of SI Chronic, stable. Denies SI. Home meds: Cymbalta 60mg  daily, prazosin 1mg  BID, seroquel 50mg  QHS.  -Continue home meds  IBS Chronic, stable. -Holding home Linzess  Migraines Home meds: Aimovig injection 70 mg q 14 days (last dose 10/30). -Continue to monitor  FEN/GI: Dysphagia 2 diet PPx: Lovenox  Status is: Inpatient Remains inpatient appropriate because:Ongoing diagnostic testing needed not appropriate for outpatient work up  Dispo: The patient is from: Home              Anticipated d/c is to: Home              Anticipated d/c date is: > 3 days              Patient currently is not medically stable to d/c.  Subjective:  Patient denies complaints at this time.  She states she is felt a bit cold at times.  Requesting to increase the temperature in her room.  She denies any fevers.  Objective: Temp:  [98 F (36.7 C)-99.4 F (37.4 C)] 98 F (36.7 C) (11/07 0007) Pulse Rate:  [68-86] 86 (11/07 0007) Resp:  [17-20] 20 (11/07 0007) BP: (121-132)/(76-92) 132/86 (11/07 0007) SpO2:  [96 %-100 %] 99 % (11/07 0007) Weight:  [94.4 kg] 94.4 kg (11/06 0429) Physical Exam: General: Alert and oriented to person, place, time and in no apparent distress Heart:  Regular rate and rhythm with no murmurs appreciated Lungs: No respiratory distress, some crackles noted in left lower lobe, clear on the right. Abdomen: Bowel sounds present, no abdominal pain Skin: Warm and dry  Laboratory: Recent Labs  Lab 09/02/20 1220 09/03/20 0212 09/04/20 0832  WBC 24.3* 22.1* 12.7*   HGB 10.9* 9.7* 10.5*  HCT 35.2* 30.4* 32.5*  PLT 220 204 234   Recent Labs  Lab 09/02/20 1835 09/03/20 0212 09/04/20 0832  NA 135 135 138  K 4.5 4.2 3.9  CL 104 103 104  CO2 22 24 26   BUN 15 14 7   CREATININE 1.34* 1.06* 1.11*  CALCIUM 8.2* 8.3* 8.7*  PROT 6.1* 5.5* 6.0*  BILITOT 0.5 0.3 0.2*  ALKPHOS 48 44 46  ALT 66* 56* 85*  AST 93* 70* 74*  GLUCOSE 123* 85 125*    Imaging/Diagnostic Tests:  EEG Result Date: 09/03/2020 IMPRESSION: This study is within normal limits. No seizures or epileptiform discharges were seen throughout the recording.   CT ANGIO HEAD W OR WO CONTRAST Result Date: 09/03/2020 IMPRESSION: 1. No large vessel occlusion or significant stenosis in the head and neck. 2. Patchy ground-glass opacities in the left lung, more fully evaluated on yesterday's chest CTA. Small bilateral pleural effusions.   CT ANGIO NECK W OR WO CONTRAST Result Date: 09/03/2020 IMPRESSION: 1. No large vessel occlusion or significant stenosis in the head and neck. 2. Patchy ground-glass opacities in the left lung, more fully evaluated on yesterday's chest CTA. Small bilateral pleural effusions.    13/02/2020, DO 09/05/2020, 12:23 AM PGY-2, [facility] FPTS Intern pager: [phone], text pages welcome

## 2020-09-06 ENCOUNTER — Inpatient Hospital Stay (HOSPITAL_COMMUNITY): Payer: Medicaid Other | Admitting: Certified Registered Nurse Anesthetist

## 2020-09-06 ENCOUNTER — Encounter (HOSPITAL_COMMUNITY)
Admission: EM | Disposition: A | Discharge status: Home / Self Care | Payer: Self-pay | Source: Home / Self Care | Attending: Family Medicine

## 2020-09-06 ENCOUNTER — Inpatient Hospital Stay (HOSPITAL_COMMUNITY): Payer: Medicaid Other

## 2020-09-06 ENCOUNTER — Encounter (HOSPITAL_COMMUNITY): Payer: Self-pay | Admitting: Family Medicine

## 2020-09-06 DIAGNOSIS — I639 Cerebral infarction, unspecified: Secondary | ICD-10-CM

## 2020-09-06 HISTORY — PX: TEE WITHOUT CARDIOVERSION: SHX5443

## 2020-09-06 HISTORY — PX: BUBBLE STUDY: SHX6837

## 2020-09-06 LAB — CBC
HCT: 36.1 % (ref 36.0–46.0)
Hemoglobin: 12.1 g/dL (ref 12.0–15.0)
MCH: 28.6 pg (ref 26.0–34.0)
MCHC: 33.5 g/dL (ref 30.0–36.0)
MCV: 85.3 fL (ref 80.0–100.0)
Platelets: 289 10*3/uL (ref 150–400)
RBC: 4.23 MIL/uL (ref 3.87–5.11)
RDW: 14.2 % (ref 11.5–15.5)
WBC: 10.6 10*3/uL — ABNORMAL HIGH (ref 4.0–10.5)
nRBC: 0 % (ref 0.0–0.2)

## 2020-09-06 LAB — COMPREHENSIVE METABOLIC PANEL
ALT: 53 U/L — ABNORMAL HIGH (ref 0–44)
AST: 27 U/L (ref 15–41)
Albumin: 3.2 g/dL — ABNORMAL LOW (ref 3.5–5.0)
Alkaline Phosphatase: 48 U/L (ref 38–126)
Anion gap: 8 (ref 5–15)
BUN: 7 mg/dL (ref 6–20)
CO2: 23 mmol/L (ref 22–32)
Calcium: 9.2 mg/dL (ref 8.9–10.3)
Chloride: 106 mmol/L (ref 98–111)
Creatinine, Ser: 1 mg/dL (ref 0.44–1.00)
GFR, Estimated: >60 mL/min (ref >60)
Glucose, Bld: 90 mg/dL (ref 70–99)
Potassium: 3.5 mmol/L (ref 3.5–5.1)
Sodium: 137 mmol/L (ref 135–145)
Total Bilirubin: 0.4 mg/dL (ref 0.3–1.2)
Total Protein: 6.5 g/dL (ref 6.5–8.1)

## 2020-09-06 SURGERY — ECHOCARDIOGRAM, TRANSESOPHAGEAL
Anesthesia: Monitor Anesthesia Care

## 2020-09-06 MED ORDER — PROPOFOL 10 MG/ML IV BOLUS
INTRAVENOUS | Status: DC | PRN
  Administered 2020-09-06 (×2): 20 mg via INTRAVENOUS
  Administered 2020-09-06: 30 mg via INTRAVENOUS

## 2020-09-06 MED ORDER — BUTAMBEN-TETRACAINE-BENZOCAINE 2-2-14 % EX AERO
INHALATION_SPRAY | CUTANEOUS | Status: DC | PRN
  Administered 2020-09-06: 2 via TOPICAL

## 2020-09-06 MED ORDER — LACTATED RINGERS IV SOLN: INTRAVENOUS | Status: DC

## 2020-09-06 MED ORDER — SODIUM CHLORIDE 0.9 % IV SOLN: INTRAVENOUS | Status: DC

## 2020-09-06 MED ORDER — CLOPIDOGREL BISULFATE 75 MG PO TABS
75.0000 mg | ORAL_TABLET | Freq: Every day | ORAL | Status: DC
  Administered 2020-09-06 – 2020-09-07 (×2): 75 mg via ORAL
  Filled 2020-09-06 (×2): qty 1

## 2020-09-06 MED ORDER — PROPOFOL 500 MG/50ML IV EMUL
INTRAVENOUS | Status: DC | PRN
  Administered 2020-09-06: 75 ug/kg/min via INTRAVENOUS

## 2020-09-06 NOTE — Progress Notes (Addendum)
Progress Note  Patient Name: Tabitha Smith Date of Encounter: 09/06/2020  Primary Cardiologist: Little Ishikawa, MD  Subjective   No specific complaints today. TEE planned for 1pm   Inpatient Medications    Scheduled Meds: . aspirin EC  81 mg Oral Daily  . carvedilol  3.125 mg Oral BID WC  . DULoxetine  60 mg Oral Daily  . enoxaparin (LOVENOX) injection  40 mg Subcutaneous Q24H  . levofloxacin  750 mg Oral Daily  . mouth rinse  15 mL Mouth Rinse BID  . prazosin  1 mg Oral BID  . QUEtiapine  50 mg Oral QHS   Continuous Infusions:  PRN Meds: ondansetron (ZOFRAN) IV   Vital Signs    Vitals:   09/05/20 1245 09/05/20 1815 09/05/20 2052 09/05/20 2329  BP: 112/66 131/87 129/82 122/62  Pulse: 67 75 88 70  Resp: 20  18 19   Temp: 98.7 F (37.1 C) 98.7 F (37.1 C) 99.1 F (37.3 C) 98.5 F (36.9 C)  TempSrc: Oral Oral Oral Oral  SpO2: 100% 98% 99% 99%  Weight:      Height:       No intake or output data in the 24 hours ending 09/06/20 0841 Filed Weights   09/03/20 1052 09/04/20 0429 09/05/20 0500  Weight: 97.2 kg 94.4 kg 82.1 kg    Physical Exam   General: Well developed, well nourished, NAD Neck: Negative for carotid bruits. No JVD Lungs:Clear to ausculation bilaterally. No wheezes, rales, or rhonchi. Breathing is unlabored. Cardiovascular: RRR with S1 S2. No murmurs Abdomen: Soft, non-tender, non-distended. No obvious abdominal masses. Extremities: No edema. Radial pulses 2+ bilaterally Neuro: Alert and oriented. No focal deficits. No facial asymmetry. MAE spontaneously. Psych: Responds to questions appropriately with normal affect.    Labs    Chemistry Recent Labs  Lab 09/04/20 0832 09/05/20 0450 09/06/20 0204  NA 138 139 137  K 3.9 3.5 3.5  CL 104 106 106  CO2 26 25 23   GLUCOSE 125* 77 90  BUN 7 6 7   CREATININE 1.11* 1.03* 1.00  CALCIUM 8.7* 8.8* 9.2  PROT 6.0* 5.7* 6.5  ALBUMIN 3.2* 2.9* 3.2*  AST 74* 44* 27  ALT 85* 69* 53*    ALKPHOS 46 46 48  BILITOT 0.2* 0.6 0.4  GFRNONAA >60 >60 >60  ANIONGAP 8 8 8      Hematology Recent Labs  Lab 09/04/20 0832 09/05/20 0450 09/06/20 0204  WBC 12.7* 9.7 10.6*  RBC 3.70* 3.87 4.23  HGB 10.5* 11.1* 12.1  HCT 32.5* 33.4* 36.1  MCV 87.8 86.3 85.3  MCH 28.4 28.7 28.6  MCHC 32.3 33.2 33.5  RDW 14.6 14.4 14.2  PLT 234 243 289    Cardiac EnzymesNo results for input(s): TROPONINI in the last 168 hours. No results for input(s): TROPIPOC in the last 168 hours.   BNPNo results for input(s): BNP, PROBNP in the last 168 hours.   DDimer No results for input(s): DDIMER in the last 168 hours.   Radiology    No results found.  Telemetry    11.8.21 NSR - Personally Reviewed  ECG    No new tracing as of 11.8.21- Personally Reviewed  Cardiac Studies   Echo 09/02/20:  1. Left ventricular ejection fraction, by estimation, is 25 to 30%. The  left ventricle has severely decreased function. The left ventricle  demonstrates global hypokinesis. Left ventricular diastolic parameters are  consistent with Grade I diastolic  dysfunction (impaired relaxation).  2. Right ventricular  systolic function is moderately reduced. The right  ventricular size is mildly enlarged. There is normal pulmonary artery  systolic pressure. The estimated right ventricular systolic pressure is  29.0 mmHg.  3. The pericardial effusion is posterior to the left ventricle. There is  no evidence of cardiac tamponade.  4. The mitral valve is normal in structure. Moderate mitral valve  regurgitation. No evidence of mitral stenosis.  5. Tricuspid valve regurgitation is moderate.  6. The aortic valve is normal in structure. Aortic valve regurgitation is  not visualized. No aortic stenosis is present.  7. The inferior vena cava is dilated in size with <50% respiratory  variability, suggesting right atrial pressure of 15 mmHg.   Patient Profile     29 y.o. female with a history of migraines,  depression, and insomnia now presenting with newly documented cardiomyopathy, suspicion of small embolic strokes based on MRI and pneumonia with sepsis.  Assessment & Plan    1.  Secondary cardiomyopathy: -Felt to possibly be secondary to sepsis>>>echo with LVEF at 25-30% with global hypokinesis and moderate RV dysfunction  -hsT elevated at 223 however EKG with no acute changes  -Transitioned to carvedilol and tolerating well>> plan if persistent biventricular dysfunction can consider Entresto and spiro  -For TEE today   2. Multifactorial acute infarct: -Involving left occipitotemporal region and also left cerebellum -Brain MRI 09/02/20 with questionable embolic source -Currently ion Lovenox  -Plan for TEE today for full evaluation    Signed, Georgie Chard NP-C [facility] Pager: [phone] 09/06/2020, 8:41 AM     For questions or updates, please contact   Please consult [email or web address] for contact info under Cardiology/STEMI.  History and all data above reviewed.  Patient examined.  I agree with the findings as above.  She feels well.  No pain. No SOB.   The patient exam reveals COR:RRR  ,  Lungs: Clear  ,  Abd: Positive bowel sounds, no rebound no guarding, Ext no edema  .  All available labs, radiology testing, previous records reviewed. Agree with documented assessment and plan.   Cardiomyopathy:  TEE preliminary today with NL LV function and RV function. No etiology for CVA.  No evidence of arrhythmia.   Continue ASA  Continue low dose Coreg but no indication for Entresto.   Fayrene Fearing Noreene Boreman  12:33 PM  09/06/2020

## 2020-09-06 NOTE — Discharge Instructions (Addendum)
Dear Tabitha Smith,   Thank you for letting us participate in your care! In this section, you will find a brief summary of why you were admitted to the hospital, what happened during your admission, your diagnosis/diagnoses, and recommended follow up.   You were admitted because you were found unresponsive at home.  You were diagnosed with a stoke and pneumonia.   You were treated with antibiotics for your pneumonia. For your stroke, we treated you with aspirin and a medication called plavix.  You were also seen by our neurology team and our cardiology team.   The cardiologists did a procedure to look at your heart and it was normal. When you first came in, your heart was not pumping as well as we'd expect, but this resolved.  POST-HOSPITAL & CARE INSTRUCTIONS 1. Please see medications section of this packet for any medication changes.  2. Please let your PCP know of any changes that were made.   DOCTOR'S APPOINTMENT & FOLLOW UP CARE INSTRUCTIONS  Future Appointments  Date Time Provider Department Center  09/15/2020  1:20 PM Little Ishikawa, MD [facility]  06/15/2021  1:00 PM Anson Fret, MD [facility] None    Thank you for choosing [facility]! Take care and be well!  Family Medicine Teaching Service Inpatient Team [place]  [facility]  [address] [place], [place] [address] [phone]

## 2020-09-06 NOTE — Anesthesia Preprocedure Evaluation (Addendum)
Anesthesia Evaluation  Patient identified by MRN, date of birth, ID band Patient awake    Reviewed: Allergy & Precautions, NPO status , Patient's Chart, lab work & pertinent test results  Airway Mallampati: III  TM Distance: >3 FB Neck ROM: Full  Mouth opening: Limited Mouth Opening  Dental no notable dental hx. (+)    Pulmonary neg pulmonary ROS, Current Smoker and Patient abstained from smoking.,    Pulmonary exam normal breath sounds clear to auscultation       Cardiovascular negative cardio ROS Normal cardiovascular exam Rhythm:Regular Rate:Normal  TTE 08/2020 1. Left ventricular ejection fraction, by estimation, is 25 to 30%. The left ventricle has severely decreased function. The left ventricle demonstrates global hypokinesis. Left ventricular diastolic parameters are consistent with Grade I diastolic dysfunction (impaired relaxation).  2. Right ventricular systolic function is moderately reduced. The right ventricular size is mildly enlarged. There is normal pulmonary artery systolic pressure. The estimated right ventricular systolic pressure is 26.9 mmHg.  3. The pericardial effusion is posterior to the left ventricle. There is no evidence of cardiac tamponade.  4. The mitral valve is normal in structure. Moderate mitral valve regurgitation. No evidence of mitral stenosis.  5. Tricuspid valve regurgitation is moderate.  6. The aortic valve is normal in structure. Aortic valve regurgitation is not visualized. No aortic stenosis is present.  7. The inferior vena cava is dilated in size with <50% respiratory variability, suggesting right atrial pressure of 15 mmHg   Neuro/Psych  Headaches, PSYCHIATRIC DISORDERS Depression CVA, No Residual Symptoms    GI/Hepatic negative GI ROS, Neg liver ROS,   Endo/Other  negative endocrine ROS  Renal/GU negative Renal ROS  negative genitourinary   Musculoskeletal negative  musculoskeletal ROS (+)   Abdominal   Peds  Hematology negative hematology ROS (+)   Anesthesia Other Findings TEE for stroke  Reproductive/Obstetrics                            Anesthesia Physical Anesthesia Plan  ASA: III  Anesthesia Plan: MAC   Post-op Pain Management:    Induction: Intravenous  PONV Risk Score and Plan: Propofol infusion and Treatment may vary due to age or medical condition  Airway Management Planned: Natural Airway  Additional Equipment:   Intra-op Plan:   Post-operative Plan:   Informed Consent: I have reviewed the patients History and Physical, chart, labs and discussed the procedure including the risks, benefits and alternatives for the proposed anesthesia with the patient or authorized representative who has indicated his/her understanding and acceptance.     Dental advisory given  Plan Discussed with: CRNA  Anesthesia Plan Comments:         Anesthesia Quick Evaluation

## 2020-09-06 NOTE — Progress Notes (Signed)
STROKE TEAM PROGRESS NOTE   INTERVAL HISTORY Patient seen just after returning from TEE.  TEE shows no evidence of intracardiac clot or PFO or other cardiac source of embolism.  Patient has no complaints.  Vital signs stable.  Neurological exam is unchanged.  She denies any prior history of DVT, pulmonary embolism, recurrent miscarriages, rash or family history of strokes or heart attacks at a young age  Vitals:   09/05/20 2052 09/05/20 2329 09/06/20 1025 09/06/20 1040  BP: 129/82 122/62 108/67 130/75  Pulse: 88 70 74 81  Resp: 18 19 16 16   Temp: 99.1 F (37.3 C) 98.5 F (36.9 C) 99.4 F (37.4 C) 98.6 F (37 C)  TempSrc: Oral Oral Oral Oral  SpO2: 99% 99% 95% 98%  Weight:      Height:       CBC:  Recent Labs  Lab 09/02/20 1220 09/02/20 1220 09/03/20 0212 09/04/20 0832 09/05/20 0450 09/06/20 0204  WBC 24.3*   < > 22.1*   < > 9.7 10.6*  NEUTROABS 21.5*  --  18.5*  --   --   --   HGB 10.9*   < > 9.7*   < > 11.1* 12.1  HCT 35.2*   < > 30.4*   < > 33.4* 36.1  MCV 92.6   < > 88.6   < > 86.3 85.3  PLT 220   < > 204   < > 243 289   < > = values in this interval not displayed.   Basic Metabolic Panel:  Recent Labs  Lab 09/05/20 0450 09/06/20 0204  NA 139 137  K 3.5 3.5  CL 106 106  CO2 25 23  GLUCOSE 77 90  BUN 6 7  CREATININE 1.03* 1.00  CALCIUM 8.8* 9.2   Lipid Panel:  Recent Labs  Lab 09/03/20 0212  CHOL 95  TRIG 35  HDL 39*  CHOLHDL 2.4  VLDL 7  LDLCALC 49   HgbA1c:  Recent Labs  Lab 09/03/20 0212  HGBA1C 5.8*   Urine Drug Screen:  Recent Labs  Lab 09/02/20 1034  LABOPIA POSITIVE*  COCAINSCRNUR NONE DETECTED  LABBENZ NONE DETECTED  AMPHETMU NONE DETECTED  THCU NONE DETECTED  LABBARB NONE DETECTED    Alcohol Level  Recent Labs  Lab 09/02/20 1040  ETH <10    IMAGING past 24 hours No results found.  PHYSICAL EXAM    Temp:  [98.5 F (36.9 C)-99.4 F (37.4 C)] 98.6 F (37 C) (11/08 1040) Pulse Rate:  [67-88] 81 (11/08 1040) Resp:   [16-20] 16 (11/08 1040) BP: (108-131)/(62-87) 130/75 (11/08 1040) SpO2:  [95 %-100 %] 98 % (11/08 1040)  General -mildly obese young African-American lady not in distress  Ophthalmologic - fundi not visualized due to noncooperation.  Cardiovascular - Regular rhythm and rate.  Mental Status -  Level of arousal and orientation to time, place, and person were intact. Language including expression, naming, repetition, comprehension was assessed and found intact. Fund of Knowledge was assessed and was intact.  Cranial Nerves II - XII - II - Visual field intact OU. III, IV, VI - Extraocular movements intact. V - Facial sensation intact bilaterally. VII - Facial movement intact bilaterally. VIII - Hearing & vestibular intact bilaterally. X - Palate elevates symmetrically. XI - Chin turning & shoulder shrug intact bilaterally. XII - Tongue protrusion intact.  Motor Strength - The patient's strength was normal in all extremities and pronator drift was absent.  Bulk was normal and fasciculations were  absent.   Motor Tone - Muscle tone was assessed at the neck and appendages and was normal.  Reflexes - The patient's reflexes were symmetrical in all extremities and she had no pathological reflexes.  Sensory - Light touch, temperature/pinprick were assessed and were symmetrical.    Coordination - The patient had normal movements in the hands and feet with no ataxia or dysmetria.  Tremor was absent.  Gait and Station - deferred.   ASSESSMENT/PLAN [name] is a 29 y.o. female with history of migraines, depression and IBS w/ root canal the day prior found unresponsive in her bed, incontinent w/ tongue bite. MRI done as part of workup shows acute infarcts.   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 Patchy LLL ground-glass opacities  2D Echo EF 25-30%. No source of embolus   EEG no sz  TEE normal.  No PFO or clot  LDL 49  HgbA1c 5.8  Hypercoagulable work up anticardiolipin IgM 19 slightly elevated, can be acute phase reactant, repeat at follow up   VTE prophylaxis - Lovenox 40 mg sq daily   No antithrombotic prior to admission, now on aspirin 81 mg daily.   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   Blood culture NGTD  EF 25-30%  TEE no clot or PFO  ? Substance abuse  UDS opiates POSITIVE    ? Tyl #3 following root canal the day prior  Responded to narcan when administered by EMS  Tobacco abuse  Current smoker  Smoking cessation counseling provided  Pt is willing to quit     Other Stroke Risk Factors  ALCOHOL use, advised to drink no more than 1 drink per day  Obesity, Body mass index is 29.23 kg/m., BMI >/= 30 associated with increased stroke risk, recommend weight loss, diet and exercise as appropriate   Migraines on Aimovig - following with Dr. Lucia Gaskins - follow outpatient  Other Active Problems  LLL PNA/septic shock  Leukocytosis WBC 10.6  Depression, well controlled, hx SI  IBS  Transaminitis AST/ALT 70/56 -> 74/85->44/69  AKI, Cre 1.0 - resolved  Anemia 12.1 - resolved  Hospital day # 3   I had a long discussion with the patient with a regards to her embolic strokes and discussed results of tests.  I counseled the patient to quit smoking cigarettes as well as substance abuse and to eat healthy and lose weight.  Recommend aspirin Plavix for 3 weeks followed by aspirin alone and aggressive risk factor modification.  She has mildly elevated IgM antiphospholipid antibody  which may be an acute phase reactant.  I recommend repeating this in 8 to 10 weeks at outpatient follow-up.  Greater than 50% time during the 35-minute visit was spent in counseling and coordination of care about her embolic stroke and discussion about stroke risk factors and stroke prevention treatment and answering questions Delia Heady, MD  To contact Stroke Continuity provider, please refer to [email or web address]. After hours, contact General Neurology

## 2020-09-06 NOTE — Progress Notes (Signed)
SLP Cancellation Note  Patient Details Name: Tabitha Smith MRN: [id number] DOB: February 11, 1991   Cancelled treatment:       Reason Eval/Treat Not Completed: SLP screened, no needs identified, will sign off. Pt denied any changes in speech/language/cognition and no deficits have been noted in these areas by other disciplines. Formal speech/language/cognition evaluation is not clinically indicated at this time.   Lachrista Heslin I. Vear Clock, MS, CCC-SLP Acute Rehabilitation Services Office number [phone] Pager [phone]  Scheryl Marten 09/06/2020, 1:37 PM

## 2020-09-06 NOTE — Progress Notes (Signed)
Physical Therapy Treatment Patient Details Name: Tabitha Smith MRN: [id number] DOB: 22-Aug-1991 Today's Date: 09/06/2020    History of Present Illness Pt is a 29 y.o. female admitted 09/02/20 with seizure-like activity and unresponsiveness at home. Found to be septic with PNA. 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 Potential for hypovolemic shock. PMH includes migraine, sinusitis.    PT Comments    Pt is fatigued after TEE this morning and is frequently yawning, however agreeable to ambulation with therapy. Pt is limited in safe mobility by increased HR with activity, max noted with gait 140 bpm. Pt is supervision for bed mobility, and transfers and min guard for ambulation without AD. Pt gait more direct today however continues to be at a decreased rate. D/c plans remain appropriate. PT will continue to follow acutely.   Follow Up Recommendations  Supervision/Assistance - 24 hour;No PT follow up     Equipment Recommendations  None recommended by PT       Precautions / Restrictions Precautions Precautions: Fall    Mobility  Bed Mobility Overal bed mobility: Needs Assistance Bed Mobility: Supine to Sit     Supine to sit: Supervision     General bed mobility comments: increased time  Transfers Overall transfer level: Needs assistance Equipment used: None Transfers: Sit to/from Stand Sit to Stand: Supervision         General transfer comment: for safety, increased time to rise and steady  Ambulation/Gait Ambulation/Gait assistance: Min guard Gait Distance (Feet): 260 Feet Assistive device: None Gait Pattern/deviations: Step-through pattern;Decreased stride length;Drifts right/left Gait velocity: slowed   General Gait Details: supervision for safety, pt with increased fatigue as gait progresses       Balance Overall balance assessment: Needs assistance   Sitting balance-Leahy Scale: Normal     Standing  balance support: No upper extremity supported;During functional activity Standing balance-Leahy Scale: Good                              Cognition Arousal/Alertness: Awake/alert Behavior During Therapy: WFL for tasks assessed/performed Overall Cognitive Status: Within Functional Limits for tasks assessed                                           General Comments General comments (skin integrity, edema, etc.): HR 120s to max noted 140bpm, pt with increased work of breathing with gait however SaO2 on RA >90%O2      Pertinent Vitals/Pain Pain Assessment: No/denies pain           PT Goals (current goals can now be found in the care plan section) Acute Rehab PT Goals Patient Stated Goal: go home PT Goal Formulation: With patient Time For Goal Achievement: 09/17/20 Potential to Achieve Goals: Good Progress towards PT goals: Progressing toward goals    Frequency    Min 3X/week      PT Plan Current plan remains appropriate       AM-PAC PT "6 Clicks" Mobility   Outcome Measure  Help needed turning from your back to your side while in a flat bed without using bedrails?: None Help needed moving from lying on your back to sitting on the side of a flat bed without using bedrails?: None Help needed moving to and from a bed to a chair (including a wheelchair)?: None Help  needed standing up from a chair using your arms (e.g., wheelchair or bedside chair)?: None Help needed to walk in hospital room?: None Help needed climbing 3-5 steps with a railing? : None 6 Click Score: 24    End of Session   Activity Tolerance: Patient tolerated treatment well;Patient limited by fatigue Patient left: in bed;with call bell/phone within reach;with bed alarm set Nurse Communication: Mobility status PT Visit Diagnosis: Other abnormalities of gait and mobility (R26.89);Difficulty in walking, not elsewhere classified (R26.2)     Time: 0109-3235 PT Time  Calculation (min) (ACUTE ONLY): 17 min  Charges:  $Therapeutic Exercise: 8-22 mins                     Jessup Ogas B. Beverely Risen PT, DPT Acute Rehabilitation Services Pager [phone] Office [phone]    Elon Alas Fleet 09/06/2020, 3:53 PM

## 2020-09-06 NOTE — Anesthesia Postprocedure Evaluation (Signed)
Anesthesia Post Note  Patient: Emalia Witkop  Procedure(s) Performed: TRANSESOPHAGEAL ECHOCARDIOGRAM (TEE) (N/A ) BUBBLE STUDY     Patient location during evaluation: Endoscopy Anesthesia Type: MAC Level of consciousness: awake and alert Pain management: pain level controlled Vital Signs Assessment: post-procedure vital signs reviewed and stable Respiratory status: spontaneous breathing, nonlabored ventilation, respiratory function stable and patient connected to nasal cannula oxygen Cardiovascular status: blood pressure returned to baseline and stable Postop Assessment: no apparent nausea or vomiting Anesthetic complications: no   No complications documented.  Last Vitals:  Vitals:   09/06/20 1207 09/06/20 1216  BP: 115/63 128/76  Pulse: 66 72  Resp: 18 13  Temp:    SpO2: 99% 100%    Last Pain:  Vitals:   09/06/20 1216  TempSrc:   PainSc: 0-No pain                 Suzannah Bettes L Loukas Antonson

## 2020-09-06 NOTE — Transfer of Care (Signed)
Immediate Anesthesia Transfer of Care Note  Patient: Tabitha Smith  Procedure(s) Performed: TRANSESOPHAGEAL ECHOCARDIOGRAM (TEE) (N/A ) BUBBLE STUDY  Patient Location: Endoscopy Unit  Anesthesia Type:MAC  Level of Consciousness: drowsy and patient cooperative  Airway & Oxygen Therapy: Patient Spontanous Breathing and Patient connected to nasal cannula oxygen  Post-op Assessment: Report given to RN, Post -op Vital signs reviewed and stable and Patient moving all extremities X 4  Post vital signs: Reviewed and stable  Last Vitals:  Vitals Value Taken Time  BP    Temp    Pulse    Resp    SpO2      Last Pain:  Vitals:   09/06/20 1040  TempSrc: Oral  PainSc: 0-No pain         Complications: No complications documented.

## 2020-09-06 NOTE — CV Procedure (Signed)
TEE  Patient sedated by anesthesiology with Propofol intravenously Throat anesthetized with Cetacaine Bite guard placed TEE probe advanced to mid esophagus without difficulty   LA, LAA without masses No PFO by color doppler or with injection x 2 of agitated saline   Smaller bublles seen late in LA after first injection consistent with intrapulmonary shunting  TV normal AV normal  MV normal PV normal  Normal LVEF and RVEF Normal thoracic aorta  Procedure was without complication.  Dietrich Pates MD

## 2020-09-06 NOTE — Interval H&P Note (Signed)
History and Physical Interval Note:  09/06/2020 10:47 AM  Tabitha Smith  has presented today for surgery, with the diagnosis of STROKE.  The various methods of treatment have been discussed with the patient and family. After consideration of risks, benefits and other options for treatment, the patient has consented to  Procedure(s): TRANSESOPHAGEAL ECHOCARDIOGRAM (TEE) (N/A) as a surgical intervention.  The patient's history has been reviewed, patient examined, no change in status, stable for surgery.  I have reviewed the patient's chart and labs.  Questions were answered to the patient's satisfaction.     Dietrich Pates

## 2020-09-06 NOTE — Progress Notes (Addendum)
Family Medicine Teaching Service Daily Progress Note Intern Pager: [phone]  Patient name: Tabitha Smith Medical record number: [id number] Date of birth: [birth year] Age: 29 y.o. Gender: female  Primary Care Provider: Carmel [place], NP Consultants: Cardiology, Neuro Code Status: FULL  Pt Overview and Major Events to Date:   11/4: admitted, neuro and cards consulted  MRI showed multifocal infarcts  Echo with EF 25-30%  Assessment and Plan:  Tabitha Smith is a 29 y.o female who presented after being found unresponsive. PMHx significant for chronic migraines, depression, hx of SI, and IBS.  Multifocal Acute Infarcts of L occipitotemporal region and L cerebellum Neuro exam continues to be normal. Thought to be embolic but from unknown source at this point. Blood cx no growth x4 days. Hypercoag workup shows anticardiolipin IgM elevated at 19. Otherwise unremarkable thus far, but several hypercoag labs still in process. -Neuro following, appreciate recommendations -TEE today -f/u remainder of hypercoag workup -Continue ASA 81mg  daily -PT/OT   New onset acute HF with reduced EF, 25-30%  Elevated Troponins Echo on admission showed EF 25-30% with global hypokinesis of LV. Unclear etiology although thought to be sepsis/stress induced cardiomyopathy per cardiology. -Cardiology following, appreciate recommendations -Plan for TEE today -Per cardiology: continue Aspirin, Lovenox, and Coreg. Consider Entresto and  spironolactone if persistent biventricular dysfunction. Also consider cardiac MRI as a possibly useful imaging modality for suspicion of a nonischemic cardiomyopathy. -Plan for repeat echo when acute illness resolves  Sepsis due to Aspiration vs. Community Acquired Pneumonia Continues to improve. All vitals have remained wnl. Satting 99% on room air.  No fevers. WBC 10.6 today (from 9.7 yesterday). -Continue PO Levaquin (abx day 5) -Plan for 7 day total  course  Transaminitis-improving Patient's LFTs improved to AST 27, ALT 53. Likely elevated initially due to hypoperfusion during unresponsive/hypotensive event. -Will stop trending daily LFTs -Consider repeat as outpatient to ensure full resolution  AKI, resolved Patient presented with Cr 1.96, thought to be pre-renal in the setting of hypotension. Resolved with fluid resuscitation. Cr 1.00 this morning. -Daily BMP  Depression, well-controlled  hx of SI Chronic, stable. Denies SI. Home meds: Cymbalta 60mg  daily, prazosin 1mg  BID, seroquel 50mg  QHS.  -Continue home meds  IBS Chronic, stable. -Holding home Linzess  Migraines Home meds: Aimovig injection 70 mg q 14 days (last dose 10/30). -Continue to monitor -Will likely d/c Aimovig per neuro  LUQ Abdominal Pain May be related to antibiotic use, given that she's had some diarrhea as well. -Will continue to monitor   FEN/GI: NPO for TEE PPx: Lovenox   Status is: Inpatient Remains inpatient appropriate because:Ongoing diagnostic testing needed not appropriate for outpatient work up  Dispo: The patient is from: Home              Anticipated d/c is to: Home              Anticipated d/c date is: 2 days              Patient currently is not medically stable to d/c.   Subjective:  No acute events overnight. Patient reports intermittent left sided upper abdominal pain but otherwise has no complaints. No further diarrhea since yesterday. Denies chest pain, SOB or headache.   Objective: Temp:  [98.2 F (36.8 C)-99.1 F (37.3 C)] 98.5 F (36.9 C) (11/07 2329) Pulse Rate:  [67-88] 70 (11/07 2329) Resp:  [16-20] 19 (11/07 2329) BP: (112-131)/(62-87) 122/62 (11/07 2329) SpO2:  [98 %-100 %] 99 % (11/07 2329) Physical Exam: General:  alert, well-appearing, NAD Cardiovascular: RRR, normal S1/S2 without m/r/g Respiratory: normal WOB, lungs CTAB Abdomen: +BS, soft, LUQ tenderness to deep palpation, otherwise  nontender Extremities: no peripheral edema, 2+ distal pulses Neuro: CN II-VII intact, normal strength and sensation in all extremities  Laboratory: Recent Labs  Lab 09/04/20 0832 09/05/20 0450 09/06/20 0204  WBC 12.7* 9.7 10.6*  HGB 10.5* 11.1* 12.1  HCT 32.5* 33.4* 36.1  PLT 234 243 289   Recent Labs  Lab 09/04/20 0832 09/05/20 0450 09/06/20 0204  NA 138 139 137  K 3.9 3.5 3.5  CL 104 106 106  CO2 26 25 23   BUN 7 6 7   CREATININE 1.11* 1.03* 1.00  CALCIUM 8.7* 8.8* 9.2  PROT 6.0* 5.7* 6.5  BILITOT 0.2* 0.6 0.4  ALKPHOS 46 46 48  ALT 85* 69* 53*  AST 74* 44* 27  GLUCOSE 125* 77 90     Imaging/Diagnostic Tests: No new imaging/diagnostic tests   , MD 09/06/2020, 6:26 AM PGY-1, [facility] FPTS Intern pager: [phone], text pages welcome

## 2020-09-06 NOTE — Progress Notes (Signed)
  Speech Language Pathology Treatment: Dysphagia  Patient Details Name: Tabitha Smith MRN: [id number] DOB: 17-Jan-1991 Today's Date: 09/06/2020 Time: 1330-1340 SLP Time Calculation (min) (ACUTE ONLY): 10 min  Assessment / Plan / Recommendation Clinical Impression  Pt was seen for dysphagia treatment and was cooperative throughout the session. Pt and nursing reported that the pt has been tolerating the current diet without overt s/sx of aspiration. Pt reported that lingual sensation has improved and is now ~80% back to baseline. Pt tolerated mixed consistency boluses (i.e., fruit cocktail), regular texture solids, and thin liquids via straw using consecutive swallows without symptoms of oropharyngeal dysphagia and pt reported that her swallow function appears to be back to baseline. It is recommended that her diet be advanced to regular texture solids and thin liquids. Further skilled SLP services are not clinically indicated at this time.    HPI HPI: 29 y.o. female with PMH of migraine, depression, and insomnia.patient initially presented with AMS after being found down at home. Given narcan in the field with some improvement in symptoms. There was some c/f seizure activity given incontinence and tongue bite at home. Patient underwent MRI to evaluate AMS and possible seizure activity which revealed small acute infarcts in the left occipitotemporal and left cerebellum regions. Neurology consulted and feel presentation could be suspicious for prolonged hypoxia in the setting of opiate overdose possible precipitating seizure activity, though in the setting of LV dysfunction, cannot exclude cardioembolic event.       SLP Plan  All goals met;Discharge SLP treatment due to (comment)       Recommendations  Diet recommendations: Regular;Thin liquid Liquids provided via: Cup;Straw Medication Administration: Whole meds with liquid Supervision: Patient able to self feed                Oral Care  Recommendations: Oral care BID Follow up Recommendations: None SLP Visit Diagnosis: Dysphagia, unspecified (R13.10) Plan: All goals met;Discharge SLP treatment due to (comment)       Azlaan Isidore I. Hardin Negus, [place], [place] Office number [phone] Pager [place] 09/06/2020, 1:42 PM

## 2020-09-06 NOTE — Progress Notes (Signed)
In regards to patient's discharge, spoke to multiple consult teams. Neurology: Recommended 6-week follow-up.  They would like to dual antiplatelet therapy x3 weeks and then ASA alone.  They will follow-up anticardiolipin antibodies at that time as well. No need to d/c migraine medications at this time. Okay for patient to drive, as no clinical evidence of seizures.  Cardiology: TEE was within normal limits today.  No Entresto needed at this time.  Continue carvedilol and ASA.  Spoke to patient about discharge, patient is more comfortable with leaving tomorrow morning. Transferred patient to med-surg.  We will plan for early a.m. discharge on 09/07/2020.  Melene Plan, M.D.  6:16 PM 09/06/2020

## 2020-09-06 NOTE — Progress Notes (Signed)
  Echocardiogram Echocardiogram Transesophageal has been performed.  [name] 09/06/2020, 11:55 AM

## 2020-09-07 ENCOUNTER — Other Ambulatory Visit (HOSPITAL_COMMUNITY): Payer: Self-pay | Admitting: Family Medicine

## 2020-09-07 LAB — BASIC METABOLIC PANEL
Anion gap: 11 (ref 5–15)
BUN: 6 mg/dL (ref 6–20)
CO2: 21 mmol/L — ABNORMAL LOW (ref 22–32)
Calcium: 9.5 mg/dL (ref 8.9–10.3)
Chloride: 104 mmol/L (ref 98–111)
Creatinine, Ser: 0.93 mg/dL (ref 0.44–1.00)
GFR, Estimated: >60 mL/min (ref >60)
Glucose, Bld: 85 mg/dL (ref 70–99)
Potassium: 3.4 mmol/L — ABNORMAL LOW (ref 3.5–5.1)
Sodium: 136 mmol/L (ref 135–145)

## 2020-09-07 LAB — CULTURE, BLOOD (ROUTINE X 2)
Culture: NO GROWTH
Culture: NO GROWTH

## 2020-09-07 LAB — PROTEIN S, TOTAL: Protein S Ag, Total: 60 % (ref 60–150)

## 2020-09-07 LAB — CBC
HCT: 37.1 % (ref 36.0–46.0)
Hemoglobin: 12.3 g/dL (ref 12.0–15.0)
MCH: 28 pg (ref 26.0–34.0)
MCHC: 33.2 g/dL (ref 30.0–36.0)
MCV: 84.3 fL (ref 80.0–100.0)
Platelets: 290 10*3/uL (ref 150–400)
RBC: 4.4 MIL/uL (ref 3.87–5.11)
RDW: 14.2 % (ref 11.5–15.5)
WBC: 9.4 10*3/uL (ref 4.0–10.5)
nRBC: 0 % (ref 0.0–0.2)

## 2020-09-07 LAB — FACTOR 5 LEIDEN

## 2020-09-07 LAB — LUPUS ANTICOAGULANT PANEL
DRVVT: 40.5 s (ref 0.0–47.0)
PTT Lupus Anticoagulant: 40.5 s (ref 0.0–51.9)

## 2020-09-07 LAB — PROTHROMBIN GENE MUTATION

## 2020-09-07 LAB — PROTEIN C ACTIVITY: Protein C Activity: 93 % (ref 73–180)

## 2020-09-07 LAB — PROTEIN S ACTIVITY: Protein S Activity: 37 % — ABNORMAL LOW (ref 63–140)

## 2020-09-07 MED ORDER — POTASSIUM CHLORIDE 20 MEQ PO PACK
20.0000 meq | PACK | Freq: Once | ORAL | Status: DC
  Filled 2020-09-07: qty 1

## 2020-09-07 MED ORDER — CLOPIDOGREL BISULFATE 75 MG PO TABS: 75.0000 mg | ORAL_TABLET | Freq: Every day | ORAL | 0 refills | Status: DC

## 2020-09-07 MED ORDER — ASPIRIN 81 MG PO TBEC
81.0000 mg | DELAYED_RELEASE_TABLET | Freq: Every day | ORAL | 0 refills | Status: DC
Start: 2020-09-07 — End: 2021-01-10

## 2020-09-07 MED ORDER — POTASSIUM CHLORIDE CRYS ER 20 MEQ PO TBCR
20.0000 meq | EXTENDED_RELEASE_TABLET | Freq: Once | ORAL | Status: AC
  Administered 2020-09-07: 20 meq via ORAL
  Filled 2020-09-07: qty 1

## 2020-09-07 MED FILL — ASPIRIN LOW DOSE 81 MG TBEC: 81 | 60 days supply | Qty: 60 | Fill #0

## 2020-09-07 MED FILL — CLOPIDOGREL 75 MG TABLET: 75 | 19 days supply | Qty: 19 | Fill #0

## 2020-09-07 NOTE — Progress Notes (Signed)
Pt received discharge instructions. Pt educated about her new medications and the importance to follow up with scheduled appointments. Based on Dr. Selena Batten note, pt is informed that it is ok for her to drive, as there is no clinical evidence of seizures. There is no further questions or concerns.

## 2020-09-07 NOTE — Progress Notes (Signed)
Occupational Therapy Treatment Patient Details Name: Tabitha Smith MRN: [id number] DOB: 06/07/1991 Today's Date: 09/07/2020    History of present illness Pt is a 29 y.o. female admitted 09/02/20 with seizure-like activity and unresponsiveness at home. Found to be septic with PNA. 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 Potential for hypovolemic shock. PMH includes migraine, sinusitis.   OT comments  Patient continues to make steady progress towards goals in skilled OT session. Patient's session encompassed functional ambulation and ADLs to assess ability to discharge home. Upon initial stand, pt reports dizziness requiring need to sit down, BP assessed 109/69. Pt adjusting washcloths at sink, with HR getting to 152, with pt reporting feeling tired and could feel her heart racing. Pt took a seated rest break, with HR regulating in less than a minute. Pt able to ambulate in hallway with no assist at a conservative pace, with HR maintaining in the 130's. Discharge remains appropriate; will continue to follow acutely.    Follow Up Recommendations  No OT follow up    Equipment Recommendations  None recommended by OT    Recommendations for Other Services      Precautions / Restrictions Precautions Precautions: Fall Restrictions Weight Bearing Restrictions: No       Mobility Bed Mobility Overal bed mobility: Modified Independent       Supine to sit: Modified independent (Device/Increase time)     General bed mobility comments: increased time  Transfers Overall transfer level: Modified independent Equipment used: None Transfers: Sit to/from Stand Sit to Stand: Modified independent (Device/Increase time)         General transfer comment: dizziness upon inital stand, with BP stable, no need for assist    Balance Overall balance assessment: Needs assistance Sitting-balance support: Feet unsupported;No upper extremity  supported Sitting balance-Leahy Scale: Normal     Standing balance support: No upper extremity supported;During functional activity Standing balance-Leahy Scale: Good                             ADL either performed or assessed with clinical judgement   ADL Overall ADL's : Needs assistance/impaired     Grooming: Wash/dry hands;Standing;Min guard;Wash/dry face           Upper Body Dressing : Set up;Sitting   Lower Body Dressing: Min guard;Sit to/from stand   Toilet Transfer: Min guard;Ambulation           Functional mobility during ADLs: Min guard General ADL Comments: dizziness upon intial stand, BP stable, ambulating in room HR up to 152, took seated rest break with VSS after less than a minute, ambulating in hallway HR noted to get up to 137 with conservate gait pace     Vision       Perception     Praxis      Cognition Arousal/Alertness: Awake/alert Behavior During Therapy: WFL for tasks assessed/performed Overall Cognitive Status: Within Functional Limits for tasks assessed                                 General Comments: pleasant and participatory, able to tell me her whole routine and what it entails        Exercises     Shoulder Instructions       General Comments      Pertinent Vitals/ Pain       Pain Assessment:  No/denies pain  Home Living                                          Prior Functioning/Environment              Frequency  Min 2X/week        Progress Toward Goals  OT Goals(current goals can now be found in the care plan section)  Progress towards OT goals: Progressing toward goals  Acute Rehab OT Goals Patient Stated Goal: go home OT Goal Formulation: With patient Time For Goal Achievement: 09/18/20 Potential to Achieve Goals: Good  Plan Discharge plan remains appropriate    Co-evaluation                 AM-PAC OT "6 Clicks" Daily Activity     Outcome  Measure   Help from another person eating meals?: None Help from another person taking care of personal grooming?: None Help from another person toileting, which includes using toliet, bedpan, or urinal?: None Help from another person bathing (including washing, rinsing, drying)?: A Little Help from another person to put on and taking off regular upper body clothing?: None Help from another person to put on and taking off regular lower body clothing?: None 6 Click Score: 23    End of Session    OT Visit Diagnosis: Other abnormalities of gait and mobility (R26.89)   Activity Tolerance Patient tolerated treatment well   Patient Left in bed;with call bell/phone within reach   Nurse Communication Mobility status;Other (comment) (Tachy HR)        Time: 3785-8850 OT Time Calculation (min): 22 min  Charges: OT General Charges $OT Visit: 1 Visit OT Treatments $Self Care/Home Management : 8-22 mins  Pollyann Glen E. Michaelpaul Apo, COTA/L Acute Rehabilitation Services [phone]   Tabitha Smith 09/07/2020, 12:54 PM

## 2020-09-07 NOTE — Discharge Summary (Addendum)
Family Medicine Teaching [facility] Discharge Summary  Patient name: Tabitha Smith Medical record number: [id number] Date of birth: 21-Feb-1991 Age: 29 y.o. 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 PMHx significant for chronic migraines, depression, hx of SI, and IBS.  Multifocal L occipitotemporal and L cerebellar infarcts Patient presented after being found unresponsive at home. She was incontinent of urine with tongue bite injury so suspicion for seizure was high. EEG was obtained while admitted and did not show any seizure activity or epileptiform discharges. MRI brain revealed multifocal L occipitotemporal and L cerebellar infarcts. Neurology was consulted and followed the patient. CVA thought to be embolic in nature although no source was identified. She was somnolent initially but her neuro exam remained normal throughout her admission. The underlying etiology of her infarcts remains unclear. She was discharged on Aspirin and Plavix and instructed to follow up with neurology  outpatient. Of note, her anticardiolipin antibodies were slightly elevated but could be an acute phase reactant per neuro. Will need repeat via outpatient neuro.  Additionally, patient's UDS + for opiates but she took 1 Tylenol with Codeine on the day prior to admission, as she was 2 days s/p root canal.  Sepsis 2/2 LLL pneumonia (likely aspiration) On presentation, patient was febrile with new O2 requirement, leukocytosis to 24.3, lactic acid 2.9, and hypotensive with BP 80s/50s. CXR demonstrated left sided pneumonia, thought to be aspiration given clinical hx of unresponsive episode and dried secretions at side of mouth. She was treated with Levaquin (initially IV, then PO) for a total of 5 days.  Secondary Cardiomyopathy Echo obtained on admission showed EF 25-30% with global hypokinesis of LV. Cardiology was consulted and started patient on carvedilol. Several days later, a TEE was performed and was normal. Ultimately the etiology was thought to be sepsis/stress induced cardiomyopathy. Her carvedilol was discontinued at discharge due to low BP and normal TEE results.  Transaminitis AST/ALT peaked at 93/66 then subsequently downtrended gradually. At time of discharge, her AST/ALT was 27/53. Etiology thought to be hypoperfusion during initial unresponsive/hypotensive event. Would consider recheck to ensure full resolution as outpatient.  AKI Patient presented with Cr 1.96, thought to be pre-renal in the setting of hypotension. Resolved with fluid resuscitation and Cr was 0.93 on morning of discharge.  Depression, Hx of SI Patient with hx of depression and prior suicide attempt. She denied SI and feels her depression is well controlled with home meds. Patient's unresponsive episode was ultimately NOT thought to be intentional overdose or suicide attempt of any kind. She was maintained on her home Cymbalta, prazosin and seroquel throughout admission.  Migraines Patient with hx of migraines, on  Aimovig injection 70mg  q14 days at home. No migraine symptoms while admitted. Neurology did not think Aimovig could have  contributed to her CVA, and therefore she can resume on discharge.  Tachycardia Patient's HR was noted to increase to 120s-140 with ambulation, but her HR remained 80s-90s at rest.She remained asymptomatic. This could potentially be related to her acute illness vs. deconditioning, but would recommend monitoring as outpatient.  Issues for Follow Up:  1. Ensure outpatient cardiology and neurology follow up 2. Neuro to follow-up anticardiolipin antibodies 3. Monitor for tachycardia, particularly with minimal exertion  Significant Procedures: TEE on 09/06/2020  Significant Labs and Imaging:  Recent Labs  Lab 09/05/20 0450 09/06/20 0204 09/07/20 0203  WBC 9.7 10.6* 9.4  HGB 11.1* 12.1 12.3  HCT 33.4* 36.1 37.1  PLT 243 289 290   Recent Labs  Lab 09/02/20 1835 09/02/20 1835 09/03/20 0212 09/03/20 0212 09/04/20 0832 09/04/20 0832 09/05/20 0450 09/05/20 0450 09/06/20 0204 09/07/20 0203  NA 135   < > 135  --  138  --  139  --  137 136  K 4.5   < > 4.2   < > 3.9   < > 3.5   < > 3.5 3.4*  CL 104   < > 103  --  104  --  106  --  106 104  CO2 22   < > 24  --  26  --  25  --  23 21*  GLUCOSE 123*   < > 85  --  125*  --  77  --  90 85  BUN 15   < > 14  --  7  --  6  --  7 6  CREATININE 1.34*   < > 1.06*  --  1.11*  --  1.03*  --  1.00 0.93  CALCIUM 8.2*   < > 8.3*  --  8.7*  --  8.8*  --  9.2 9.5  ALKPHOS 48  --  44  --  46  --  46  --  48  --   AST 93*  --  70*  --  74*  --  44*  --  27  --   ALT 66*  --  56*  --  85*  --  69*  --  53*  --   ALBUMIN 3.1*  --  3.0*  --  3.2*  --  2.9*  --  3.2*  --    < > = values in this interval not displayed.    Results/Tests Pending at Time of Discharge: None  Discharge Medications:  Allergies as of 09/07/2020       Reactions   Amoxicillin Anaphylaxis   Penicillins Anaphylaxis   Has patient had a PCN reaction causing  immediate rash, facial/tongue/throat swelling, SOB or lightheadedness with hypotension: Yes Has patient had a PCN reaction causing severe rash involving mucus membranes or skin necrosis: Unk Has patient had a PCN reaction that required hospitalization: No Has patient had a PCN reaction occurring within the last 10 years: No If all of the above answers are "NO", then may proceed with Cephalosporin use.        Medication List     TAKE these medications    Aimovig 70 MG/ML Soaj Generic drug: Erenumab-aooe Inject 70 mg into the skin every 14 (fourteen) days.   aspirin 81 MG EC tablet Take 1 tablet (81 mg total) by mouth daily. Swallow whole.   clopidogrel 75 MG tablet Commonly known as: PLAVIX Take 1 tablet (75 mg total) by mouth daily for 19 days.   DULoxetine  60 MG capsule Commonly known as: CYMBALTA Take 60 mg by mouth daily.   Linzess 145 MCG Caps capsule Generic drug: linaclotide Take 145 mcg by mouth every other day.   Nurtec 75 MG Tbdp Generic drug: Rimegepant Sulfate Take 75 mg by mouth daily as needed. For migraines. Take as close to onset of migraine as possible. One daily maximum.   prazosin 1 MG capsule Commonly known as: MINIPRESS Take 1 mg by mouth 2 (two) times daily.   QUEtiapine 50 MG Tb24 24 hr tablet Commonly known as: SEROQUEL XR Take 50 mg by mouth at bedtime.   Vitamin D (Ergocalciferol) 1.25 MG (50000 UNIT) Caps capsule Commonly known as: DRISDOL Take 50,000 Units by mouth once a week. Saturday   zolpidem 10 MG tablet Commonly known as: AMBIEN Take 10 mg by mouth at bedtime.        Discharge Instructions: Please refer to Patient Instructions section of EMR for full details.  Patient was counseled important signs and symptoms that should prompt return to medical care, changes in medications, dietary instructions, activity restrictions, and follow up appointments.   Follow-Up Appointments:  Follow-up Information     Little Ishikawa, MD Follow up on 09/15/2020.   Specialties: Cardiology, Radiology Why: at 1:20pm Contact information: [address] [place] [address] [phone]                 Maury Dus, MD 09/07/2020, 9:41 AM PGY-1, [facility]  FPTS Upper-Level Resident Addendum   I have independently interviewed and examined the patient. I have discussed the above with the original author and agree with their documentation. Please see also any attending notes.   Derrel Nip, MD PGY-2, [facility] 09/07/2020 10:07 AM  FPTS Service pager: [phone] (text pages welcome through [facility])

## 2020-09-08 ENCOUNTER — Encounter (HOSPITAL_COMMUNITY): Payer: Self-pay | Admitting: Internal Medicine

## 2020-09-10 ENCOUNTER — Other Ambulatory Visit: Payer: Self-pay | Admitting: *Deleted

## 2020-09-10 NOTE — Patient Outreach (Signed)
[facility]) Care Management  09/10/2020  Tabitha Smith August 16, 1991 [id number]   RED ON EMMI ALERT - Stroke Day # 1 Date: 11/11 Red Alert Reason: Feeling worse overall, New/worsening shortness of breath/fever/pain, Not able to eat/drink, and No way to get to follow up appointment   Outreach attempt #1, successful.  Identity verified.  This care manager introduced self and stated purpose of call.  [facility] care management services explained.    She report she has remained tired, experiencing headaches.  Hasn't taken any pain relief for it due to concern for bleeding (aspirin components).  Educated on use of Tylenol, encouraged to try.  State she is still experiencing som chest discomfort with exertion.  She was feeling this while in the hospital, has appointment with cardiology on 11/17.  Encouraged monitor heart rate and record for trends to present to MD.  Also has office visit scheduled with PCP on 11/16.  Has had decreased appetite and some diarrhea.  She has been able to eat small meals and stay hydrated, will discus this with PCP on Tuesday.  Lives with her sister in law and other family, denies any transportation concerns.  Overall feels she is slowly improving, denies any urgent questions/concerns.  Provided member with this care manager's contact information, advised to contact with questions.  Plan: RN CM will follow up within the next 2 weeks.  Kemper Durie, [place], MSN [facility] Med Care Management  [facility] Manager [phone]

## 2020-09-13 NOTE — Progress Notes (Signed)
Cardiology Office Note:    Date:  09/15/2020   ID:  Tabitha Smith, DOB 07/14/1991, MRN [id number]  PCP:  Carmel Sacramento, NP  Cardiologist:  Little Ishikawa, MD  Electrophysiologist:  None   Referring MD: Carmel Sacramento, NP   Chief Complaint  Patient presents with  . Congestive Heart Failure    History of Present Illness:    Tabitha Smith is a 29 y.o. 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  She was admitted to [facility] on 09/02/2020.  She had underwent a root canal the day prior and took Tylenol with codeine prior to going to bed that night.  Following morning her sister found she was unarousable.  Possible seizure-like activity.  EMS was called, symptoms seem to improve with Narcan.  She was found to be in septic/hypovolemic shock on admission.  Hypotension, AKI, lactic acidosis resolved with IV fluids.  CTPA showed patchy airspace opacities likely representing aspiration pneumonia.  Echocardiogram showed LVEF 25 to 30%, global hypokinesis, moderate RV dysfunction, moderate MR, moderate TR.  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  Brain MRI showed multiple acute infarcts.  TEE was done on 09/06/2020, no evidence of embolic source of stroke.  Systolic function on TEE was normal.  Neurology was consulted and she was discharged on aspirin and Plavix.  Since discharge from the hospital, she reports that she is doing better.  She has started back at work.  She denies any chest pain or dyspnea.  Does report some lightheadedness with standing.  Reports some numbness in her face but otherwise denies any neurologic deficits.  States that she feels like her heart is racing with minimal exertion however.  Also reports he has been having episodes of palpitations where she feels like heart is racing that lasts for a few minutes.  Typically happens a couple times per week.  She  is taking DAPT, denies any bleeding issues.   Past Medical History:  Diagnosis Date  . Migraine   . Sinusitis     Past Surgical History:  Procedure Laterality Date  . BUBBLE STUDY  09/06/2020   Procedure: BUBBLE STUDY;  Surgeon: Pricilla Riffle, MD;  Location: [facility] ENDOSCOPY;  Service: Cardiovascular;;  . NO PAST SURGERIES    . stitches to abdomen      for a cut   . TEE WITHOUT CARDIOVERSION N/A 09/06/2020   Procedure: TRANSESOPHAGEAL ECHOCARDIOGRAM (TEE);  Surgeon: Pricilla Riffle, MD;  Location: [facility] ENDOSCOPY;  Service: Cardiovascular;  Laterality: N/A;    Current Medications: Current Meds  Medication Sig  . aspirin EC 81 MG EC tablet Take 1 tablet (81 mg total) by mouth daily. Swallow whole.  . clopidogrel (PLAVIX) 75 MG tablet Take 1 tablet (75 mg total) by mouth daily for 19 days.  . DULoxetine (CYMBALTA) 60 MG capsule Take 60 mg by mouth daily.  Dorise Hiss (AIMOVIG) 70 MG/ML SOAJ Inject 70 mg into the skin every 14 (fourteen) days.  Marland Kitchen linaclotide (LINZESS) 145 MCG CAPS capsule Take 145 mcg by mouth every other day.   . prazosin (MINIPRESS) 1 MG capsule Take 1 mg by mouth 2 (two) times daily.  . QUEtiapine (SEROQUEL XR) 50 MG TB24 24 hr tablet Take 50 mg by mouth at bedtime.  . Vitamin D, Ergocalciferol, (DRISDOL) 1.25 MG (50000 UNIT) CAPS capsule Take 50,000 Units by mouth once a week. Saturday  . zolpidem (  AMBIEN) 10 MG tablet Take 10 mg by mouth at bedtime.     Allergies:   Amoxicillin and Penicillins   Social History   Socioeconomic History  . Marital status: Single    Spouse name: Not on file  . Number of children: 1  . Years of education: Not on file  . Highest education level: High school graduate  Occupational History  . Not on file  Tobacco Use  . Smoking status: Former Smoker    Packs/day: 0.10    Types: Cigarettes  . Smokeless tobacco: Never Used  . Tobacco comment: 1/2 pack a week now   Vaping Use  . Vaping Use: Never used  Substance and Sexual  Activity  . Alcohol use: Yes    Comment: occasionally  . Drug use: Never  . Sexual activity: Not on file  Other Topics Concern  . Not on file  Social History Narrative   Lives at home with family   Left handed   Caffeine: coffee, 2-4 cups/day   Social Determinants of Health   Financial Resource Strain:   . Difficulty of Paying Living Expenses: Not on file  Food Insecurity:   . Worried About Programme researcher, broadcasting/film/video in the Last Year: Not on file  . Ran Out of Food in the Last Year: Not on file  Transportation Needs:   . Lack of Transportation (Medical): Not on file  . Lack of Transportation (Non-Medical): Not on file  Physical Activity:   . Days of Exercise per Week: Not on file  . Minutes of Exercise per Session: Not on file  Stress:   . Feeling of Stress : Not on file  Social Connections:   . Frequency of Communication with Friends and Family: Not on file  . Frequency of Social Gatherings with Friends and Family: Not on file  . Attends Religious Services: Not on file  . Active Member of Clubs or Organizations: Not on file  . Attends Banker Meetings: Not on file  . Marital Status: Not on file     Family History: The patient's family history includes Cancer in her mother; Diabetes in her mother; Hypertension in her mother; Seizures in her mother. There is no history of Migraines or Headache.  ROS:   Please see the history of present illness.     All other systems reviewed and are negative.  EKGs/Labs/Other Studies Reviewed:    The following studies were reviewed today:   EKG:  EKG is ordered today.  The ekg ordered today demonstrates normal sinus rhythm, rate 70, no ST/T abnormalities, Q waves in V1/2 and III  Recent Labs: 09/06/2020: ALT 53 09/07/2020: BUN 6; Creatinine, Ser 0.93; Hemoglobin 12.3; Platelets 290; Potassium 3.4; Sodium 136  Recent Lipid Panel    Component Value Date/Time   CHOL 95 09/03/2020 0212   TRIG 35 09/03/2020 0212   HDL 39 (L)  09/03/2020 0212   CHOLHDL 2.4 09/03/2020 0212   VLDL 7 09/03/2020 0212   LDLCALC 49 09/03/2020 0212    Physical Exam:    VS:  BP 116/80 (BP Location: Left Arm, Patient Position: Sitting)   Pulse 70   Ht 5\' 6"  (1.676 m)   Wt 177 lb 12.8 oz (80.6 kg)   SpO2 98%   BMI 28.70 kg/m     Wt Readings from Last 3 Encounters:  09/15/20 177 lb 12.8 oz (80.6 kg)  09/07/20 176 lb 11.2 oz (80.2 kg)  06/14/20 185 lb (83.9 kg)  GEN:  Well nourished, well developed in no acute distress HEENT: Normal NECK: No JVD; No carotid bruits LYMPHATICS: No lymphadenopathy CARDIAC: RRR, no murmurs, rubs, gallops RESPIRATORY:  Clear to auscultation without rales, wheezing or rhonchi  ABDOMEN: Soft, non-tender, non-distended MUSCULOSKELETAL:  No edema; No deformity  SKIN: Warm and dry NEUROLOGIC:  Alert and oriented x 3 PSYCHIATRIC:  Normal affect   ASSESSMENT:    1. Acute systolic heart failure (HCC)   2. Palpitations   3. Cerebrovascular accident (CVA), unspecified mechanism (HCC)    PLAN:    Acute combined systolic and diastolic heart failure: EF 25 to 30% during recent admission.  Likely secondary to septic shock.  TEE after shock resolved showed normal LV systolic function. -Limited views on TEE, will repeat TTE for full evaluation of LV function  Acute CVA: Unclear cause.  TEE did not show source of embolism.  Will plan Zio patch x14 days.  On aspirin/Plavix.  Has follow-up with neurology.  Palpitations: We will evaluate with Zio patch x14 days as above  RTC in 3 months   Medication Adjustments/Labs and Tests Ordered: Current medicines are reviewed at length with the patient today.  Concerns regarding medicines are outlined above.  Orders Placed This Encounter  Procedures  . LONG TERM MONITOR (3-14 DAYS)  . EKG 12-Lead  . ECHOCARDIOGRAM COMPLETE   No orders of the defined types were placed in this encounter.   Patient Instructions  Medication Instructions:  Your physician  recommends that you continue on your current medications as directed. Please refer to the Current Medication list given to you today.  Testing/Procedures: Your physician has requested that you have an echocardiogram. Echocardiography is a painless test that uses sound waves to create images of your heart. It provides your doctor with information about the size and shape of your heart and how well your heart's chambers and valves are working. This procedure takes approximately one hour. There are no restrictions for this procedure. This will be done at our [facility] location:  [address]   ZIO XT- Long Term Monitor Instructions   Your physician has requested you wear your ZIO patch monitor 14 days.   This is a single patch monitor.  [employer] supplies one patch monitor per enrollment.  Additional stickers are not available.   Please do not apply patch if you will be having a Nuclear Stress Test, Echocardiogram, Cardiac CT, MRI, or Chest Xray during the time frame you would be wearing the monitor. The patch cannot be worn during these tests.  You cannot remove and re-apply the ZIO XT patch monitor.   Your ZIO patch monitor will be sent USPS Priority mail from [facility] directly to your home address. The monitor may also be mailed to a PO BOX if home delivery is not available.   It may take 3-5 days to receive your monitor after you have been enrolled.   Once you have received you monitor, please review enclosed instructions.  Your monitor has already been registered assigning a specific monitor serial # to you.   Applying the monitor   Shave hair from upper left chest.   Hold abrader disc by orange tab.  Rub abrader in 40 strokes over left upper chest as indicated in your monitor instructions.   Clean area with 4 enclosed alcohol pads .  Use all pads to assure are is cleaned thoroughly.  Let dry.   Apply patch as indicated in monitor instructions.  Patch  will  be place under collarbone on left side of chest with arrow pointing upward.   Rub patch adhesive wings for 2 minutes.Remove white label marked "1".  Remove white label marked "2".  Rub patch adhesive wings for 2 additional minutes.   While looking in a mirror, press and release button in center of patch.  A small green light will flash 3-4 times .  This will be your only indicator the monitor has been turned on.     Do not shower for the first 24 hours.  You may shower after the first 24 hours.   Press button if you feel a symptom. You will hear a small click.  Record Date, Time and Symptom in the Patient Log Book.   When you are ready to remove patch, follow instructions on last 2 pages of Patient Log Book.  Stick patch monitor onto last page of Patient Log Book.   Place Patient Log Book in [place] box.  Use locking tab on box and tape box closed securely.  The [employer] has [employer] on it.  Please place in mailbox as soon as possible.  Your physician should have your test results approximately 7 days after the monitor has been mailed back to [facility].   Call [facility] Customer Care at [phone] if you have questions regarding your ZIO XT patch monitor.  Call them immediately if you see an orange light blinking on your monitor.   If your monitor falls off in less than 4 days contact our Monitor department at [phone].  If your monitor becomes loose or falls off after 4 days call [employer] at [phone] for suggestions on securing your monitor.   Follow-Up: At [facility], you and your health needs are our priority.  As part of our continuing mission to provide you with exceptional heart care, we have created designated Provider Care Teams.  These Care Teams include your primary Cardiologist (physician) and Advanced Practice Providers (APPs -  Physician Assistants and Nurse Practitioners) who all work together to provide you with the care you need, when you  need it.  We recommend signing up for the patient portal called "MyChart".  Sign up information is provided on this After Visit Summary.  MyChart is used to connect with patients for Virtual Visits (Telemedicine).  Patients are able to view lab/test results, encounter notes, upcoming appointments, etc.  Non-urgent messages can be sent to your provider as well.   To learn more about what you can do with MyChart, go to [email or web address].    Your next appointment:   3 month(s)  The format for your next appointment:   In Person  Provider:   Epifanio Lesches, MD       Signed, Little Ishikawa, MD  09/15/2020 9:08 PM     [facility]

## 2020-09-15 ENCOUNTER — Encounter: Payer: Self-pay | Admitting: Cardiology

## 2020-09-15 ENCOUNTER — Other Ambulatory Visit: Payer: Self-pay

## 2020-09-15 ENCOUNTER — Encounter: Payer: Self-pay | Admitting: *Deleted

## 2020-09-15 ENCOUNTER — Ambulatory Visit (INDEPENDENT_AMBULATORY_CARE_PROVIDER_SITE_OTHER): Payer: Medicaid Other | Admitting: Cardiology

## 2020-09-15 VITALS — BP 116/80 | HR 70 | Ht 66.0 in | Wt 177.8 lb

## 2020-09-15 DIAGNOSIS — I5021 Acute systolic (congestive) heart failure: Secondary | ICD-10-CM

## 2020-09-15 DIAGNOSIS — R002 Palpitations: Secondary | ICD-10-CM

## 2020-09-15 DIAGNOSIS — I639 Cerebral infarction, unspecified: Secondary | ICD-10-CM

## 2020-09-15 NOTE — Progress Notes (Signed)
Patient ID: Tabitha Smith, female   DOB: Mar 21, 1991, 29 y.o.   MRN: [id number] Patient enrolled for [employer] to ship a 14 day ZIO XT long term holter monitor to her home.

## 2020-09-15 NOTE — Patient Instructions (Signed)
Medication Instructions:  Your physician recommends that you continue on your current medications as directed. Please refer to the Current Medication list given to you today.  Testing/Procedures: Your physician has requested that you have an echocardiogram. Echocardiography is a painless test that uses sound waves to create images of your heart. It provides your doctor with information about the size and shape of your heart and how well your heart's chambers and valves are working. This procedure takes approximately one hour. There are no restrictions for this procedure.  This will be done at our [address] location:  [address]   ZIO XT- Long Term Monitor Instructions   Your physician has requested you wear your ZIO patch monitor___14___days.   This is a single patch monitor.  [employer] supplies one patch monitor per enrollment.  Additional stickers are not available.   Please do not apply patch if you will be having a Nuclear Stress Test, Echocardiogram, Cardiac CT, MRI, or Chest Xray during the time frame you would be wearing the monitor. The patch cannot be worn during these tests.  You cannot remove and re-apply the ZIO XT patch monitor.   Your ZIO patch monitor will be sent USPS Priority mail from [employer] directly to your home address. The monitor may also be mailed to a PO BOX if home delivery is not available.   It may take 3-5 days to receive your monitor after you have been enrolled.   Once you have received you monitor, please review enclosed instructions.  Your monitor has already been registered assigning a specific monitor serial # to you.   Applying the monitor   Shave hair from upper left chest.   Hold abrader disc by orange tab.  Rub abrader in 40 strokes over left upper chest as indicated in your monitor instructions.   Clean area with 4 enclosed alcohol pads .  Use all pads to assure are is cleaned thoroughly.  Let dry.   Apply patch  as indicated in monitor instructions.  Patch will be place under collarbone on left side of chest with arrow pointing upward.   Rub patch adhesive wings for 2 minutes.Remove white label marked "1".  Remove white label marked "2".  Rub patch adhesive wings for 2 additional minutes.   While looking in a mirror, press and release button in center of patch.  A small green light will flash 3-4 times .  This will be your only indicator the monitor has been turned on.     Do not shower for the first 24 hours.  You may shower after the first 24 hours.   Press button if you feel a symptom. You will hear a small click.  Record Date, Time and Symptom in the Patient Log Book.   When you are ready to remove patch, follow instructions on last 2 pages of Patient Log Book.  Stick patch monitor onto last page of Patient Log Book.   Place Patient Log Book in Blue box.  Use locking tab on box and tape box closed securely.  The Orange [employer] box has prepaid postage on it.  Please place in mailbox as soon as possible.  Your physician should have your test results approximately 7 days after the monitor has been mailed back to [employer].   Call [employer] Customer Care at [phone] if you have questions regarding your ZIO XT patch monitor.  Call them immediately if you see an orange light blinking on your   monitor.   If your monitor falls off in less than 4 days contact our Monitor department at [phone].  If your monitor becomes loose or falls off after 4 days call [employer] at [phone] for suggestions on securing your monitor.    Follow-Up: At [facility], you and your health needs are our priority.  As part of our continuing mission to provide you with exceptional heart care, we have created designated Provider Care Teams.  These Care Teams include your primary Cardiologist (physician) and Advanced Practice Providers (APPs -  Physician Assistants and Nurse Practitioners) who all work  together to provide you with the care you need, when you need it.  We recommend signing up for the patient portal called "MyChart".  Sign up information is provided on this After Visit Summary.  MyChart is used to connect with patients for Virtual Visits (Telemedicine).  Patients are able to view lab/test results, encounter notes, upcoming appointments, etc.  Non-urgent messages can be sent to your provider as well.   To learn more about what you can do with MyChart, go to [email or web address].    Your next appointment:   3 month(s)  The format for your next appointment:   In Person  Provider:   Christopher Schumann, MD    

## 2020-09-17 ENCOUNTER — Other Ambulatory Visit (INDEPENDENT_AMBULATORY_CARE_PROVIDER_SITE_OTHER): Payer: Medicaid Other

## 2020-09-17 DIAGNOSIS — I639 Cerebral infarction, unspecified: Secondary | ICD-10-CM | POA: Diagnosis not present

## 2020-09-17 DIAGNOSIS — R002 Palpitations: Secondary | ICD-10-CM | POA: Diagnosis not present

## 2020-09-22 ENCOUNTER — Other Ambulatory Visit: Payer: Self-pay | Admitting: *Deleted

## 2020-09-22 NOTE — Patient Outreach (Signed)
[facility]) Care Management  09/22/2020  Tabitha Smith [birth year] [id number]   Call placed to follow up on stroke recovery, no answer. HIPAA compliant voice message left, will follow up within the next 3-4 business days.  Kemper Durie, [place], MSN [facility] Care Management  [facility] Manager [phone]

## 2020-09-28 ENCOUNTER — Other Ambulatory Visit: Payer: Self-pay | Admitting: *Deleted

## 2020-09-28 NOTE — Patient Outreach (Signed)
[facility]) Care Management  09/28/2020  Tabitha Smith May 22, 1991 [id number]   Outreach attempt #2, successful.  Member report she is recovering well but still is feeling tired.  She does have days where she is dizzy and experiencing increased heart rate, but she is wearing a 2 week holter monitor.  She will follow up with cardiology once test is complete.  She is also monitoring blood pressure trends during these episodes and sharing with providers.  Has echo scheduled for 12/10, hasn't called to schedule sooner appointment with neuro but will do so (next visit not until August 2022).  She denies any urgent concerns at this time, no further complex needs. Will close case at this time but encouraged to contact this care manager with questions.  Kemper Durie, [place], MSN [facility] Care Management  [facility] Manager [phone]

## 2020-10-08 ENCOUNTER — Other Ambulatory Visit: Payer: Self-pay

## 2020-10-08 ENCOUNTER — Ambulatory Visit (HOSPITAL_COMMUNITY): Payer: Medicaid Other | Attending: Cardiology

## 2020-10-08 DIAGNOSIS — I5021 Acute systolic (congestive) heart failure: Secondary | ICD-10-CM | POA: Diagnosis not present

## 2020-10-08 LAB — ECHOCARDIOGRAM COMPLETE
Area-P 1/2: 4.89 cm2
S' Lateral: 2.9 cm

## 2020-10-22 ENCOUNTER — Encounter (HOSPITAL_COMMUNITY): Payer: Self-pay | Admitting: Emergency Medicine

## 2020-10-22 ENCOUNTER — Ambulatory Visit (HOSPITAL_COMMUNITY)
Admission: EM | Admit: 2020-10-22 | Discharge: 2020-10-22 | Disposition: A | Discharge status: Home / Self Care | Payer: Medicaid Other | Attending: Family Medicine | Admitting: Family Medicine

## 2020-10-22 ENCOUNTER — Other Ambulatory Visit: Payer: Self-pay

## 2020-10-22 DIAGNOSIS — K118 Other diseases of salivary glands: Secondary | ICD-10-CM | POA: Diagnosis not present

## 2020-10-22 HISTORY — DX: Cerebral infarction, unspecified: I63.9

## 2020-10-22 MED ORDER — HYDROCODONE-ACETAMINOPHEN 7.5-325 MG PO TABS: 1.0000 | ORAL_TABLET | Freq: Four times a day (QID) | ORAL | 0 refills | Status: DC | PRN

## 2020-10-22 MED ORDER — LEVOFLOXACIN 500 MG PO TABS: 500.0000 mg | ORAL_TABLET | Freq: Every day | ORAL | 0 refills | Status: DC

## 2020-10-22 NOTE — Discharge Instructions (Signed)
Drink plenty of fluid Take antibiotic once a day Take ibuprofen or naproxen for moderate pain Take pain medication as needed for severe pain.  Do not drive on hydrocodone Go to ER if worse Try sour food (lemon) or candy

## 2020-10-22 NOTE — ED Triage Notes (Signed)
Pt c/o lump on neck x 2-3 days. Pt states her neck feels stiff. It started small and gradually got bigger. Pt denies any trouble breathing.

## 2020-10-22 NOTE — ED Provider Notes (Signed)
[facility]    CSN: [id number] Arrival date & time: 10/22/20  1017      History   Chief Complaint Chief Complaint  Patient presents with   Abscess    HPI Tabitha Smith is a 29 y.o. female.   HPI  Patient states that she has a painful lump underneath the right side of her jaw.  No fever chills.  No runny nose.  No sore throat.  No change in voice.  No trouble breathing.  She has never had this before. Chart is reviewed Patient had hospitalization this NOV for serious illness, cardiomyopathy, septic shock, CVA.  Medications reviewed.  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   Sepsis (HCC) 09/03/2020   Septic shock (HCC) 09/02/2020   Depression 09/02/2020   History of suicidal ideation 09/02/2020   Aspiration pneumonia of left lower lobe (HCC)    Demand ischemia of myocardium (HCC)    Chronic migraine without aura, with intractable migraine, so stated, with status migrainosus 08/06/2018    Past Surgical History:  Procedure Laterality Date   BUBBLE STUDY  09/06/2020   Procedure: BUBBLE STUDY;  Surgeon: Pricilla Riffle, MD;  Location: [facility] ENDOSCOPY;  Service: Cardiovascular;;   NO PAST SURGERIES     stitches to abdomen      for a cut    TEE WITHOUT CARDIOVERSION N/A 09/06/2020   Procedure: TRANSESOPHAGEAL ECHOCARDIOGRAM (TEE);  Surgeon: Pricilla Riffle, MD;  Location: [facility] ENDOSCOPY;  Service: Cardiovascular;  Laterality: N/A;    OB History   No obstetric history on file.      Home Medications    Prior to Admission medications   Medication Sig Start Date End Date Taking? Authorizing Provider  aspirin EC 81 MG EC tablet Take 1 tablet (81 mg total) by mouth daily. Swallow whole. 09/07/20  Yes Maury Dus, MD   DULoxetine (CYMBALTA) 60 MG capsule Take 60 mg by mouth daily. 08/06/20  Yes [provider]  linaclotide (LINZESS) 145 MCG CAPS capsule Take 145 mcg by mouth every other day.    Yes [provider]  prazosin (MINIPRESS) 1 MG capsule Take 1 mg by mouth 2 (two) times daily.   Yes [provider]  Vitamin D, Ergocalciferol, (DRISDOL) 1.25 MG (50000 UNIT) CAPS capsule Take 50,000 Units by mouth once a week. Saturday 07/22/20  Yes [provider]  Erenumab-aooe (AIMOVIG) 70 MG/ML SOAJ Inject 70 mg into the skin every 14 (fourteen) days. 06/14/20   Anson Fret, MD  HYDROcodone-acetaminophen (NORCO) 7.5-325 MG tablet Take 1 tablet by mouth every 6 (six) hours as needed for moderate pain. 10/22/20   Eustace Moore, MD  levofloxacin (LEVAQUIN) 500 MG tablet Take 1 tablet (500 mg total) by mouth daily. 10/22/20   Eustace Moore, MD  QUEtiapine (SEROQUEL XR) 50 MG TB24 24 hr tablet Take 50 mg by mouth at bedtime. 08/20/20   [provider]  Rimegepant Sulfate (NURTEC) 75 MG TBDP Take 75 mg by mouth daily as needed. For migraines. Take as close to onset of migraine as possible. One daily maximum. 06/14/20   Anson Fret, MD  zolpidem (AMBIEN) 10 MG tablet Take 10 mg by mouth at bedtime.    [provider]  Family History Family History  Problem Relation Age of Onset   Diabetes Mother    Seizures Mother    Cancer Mother        Colon   Hypertension Mother    Migraines Neg Hx    Headache Neg Hx     Social History Social History   Tobacco Use   Smoking status: Former Smoker    Packs/day: 0.10    Types: Cigarettes   Smokeless tobacco: Never Used   Tobacco comment: 1/2 pack a week now   Vaping Use   Vaping Use: Never used  Substance Use Topics   Alcohol use: Yes    Comment: occasionally   Drug use: Never     Allergies   Amoxicillin and Penicillins   Review of Systems Review of Systems See HPI  Physical  Exam Triage Vital Signs ED Triage Vitals  Enc Vitals Group     BP 10/22/20 1141 122/73     Pulse Rate 10/22/20 1141 70     Resp 10/22/20 1141 18     Temp 10/22/20 1141 99.3 F (37.4 C)     Temp Source 10/22/20 1141 Oral     SpO2 10/22/20 1141 100 %     Weight --      Height --      Head Circumference --      Peak Flow --      Pain Score 10/22/20 1138 6     Pain Loc --      Pain Edu? --      Excl. in GC? --    No data found.  Updated Vital Signs BP 122/73 (BP Location: Left Arm)    Pulse 70    Temp 99.3 F (37.4 C) (Oral)    Resp 18    LMP 10/15/2020    SpO2 100%     Physical Exam Constitutional:      General: She is not in acute distress.    Appearance: She is well-developed and well-nourished.  HENT:     Head: Normocephalic and atraumatic.     Salivary Glands: Right salivary gland is diffusely enlarged and tender.      Mouth/Throat:     Mouth: Oropharynx is clear and moist.  Eyes:     Conjunctiva/sclera: Conjunctivae normal.     Pupils: Pupils are equal, round, and reactive to light.  Cardiovascular:     Rate and Rhythm: Normal rate and regular rhythm.     Heart sounds: Normal heart sounds.  Pulmonary:     Effort: Pulmonary effort is normal. No respiratory distress.     Breath sounds: Normal breath sounds.  Abdominal:     General: There is no distension.     Palpations: Abdomen is soft.  Musculoskeletal:        General: No edema. Normal range of motion.     Cervical back: Normal range of motion.  Lymphadenopathy:     Cervical: No cervical adenopathy.  Skin:    General: Skin is warm and dry.  Neurological:     Mental Status: She is alert.  Psychiatric:        Behavior: Behavior normal.      UC Treatments / Results  Labs (all labs ordered are listed, but only abnormal results are displayed) Labs Reviewed - No data to display  EKG   Radiology No results found.  Procedures Procedures (including critical care time)  Medications Ordered in  UC Medications - No data to display  Initial Impression /  Assessment and Plan / UC Course  I have reviewed the triage vital signs and the nursing notes.  Pertinent labs & imaging results that were available during my care of the patient were reviewed by me and considered in my medical decision making (see chart for details).     Infection versus stone.  Will cover with antibiotics and pain management.  Warm compresses.  To ER if fails to improve, has difficulty swallowing or breathing, or has worsening swelling Final Clinical Impressions(s) / UC Diagnoses   Final diagnoses:  Pain in salivary gland region     Discharge Instructions     Drink plenty of fluid Take antibiotic once a day Take ibuprofen or naproxen for moderate pain Take pain medication as needed for severe pain.  Do not drive on hydrocodone Go to ER if worse Try sour food (lemon) or candy   ED Prescriptions    Medication Sig Dispense Auth. Provider   levofloxacin (LEVAQUIN) 500 MG tablet Take 1 tablet (500 mg total) by mouth daily. 7 tablet Eustace Moore, MD   HYDROcodone-acetaminophen [facility]) 7.5-325 MG tablet Take 1 tablet by mouth every 6 (six) hours as needed for moderate pain. 10 tablet Eustace Moore, MD     I have reviewed the PDMP during this encounter.   Eustace Moore, MD 10/22/20 1336

## 2020-12-12 NOTE — Progress Notes (Deleted)
Cardiology Office Note:    Date:  12/12/2020   ID:  Tabitha Smith, DOB 1991/04/10, MRN [id number]  PCP:  Carmel Sacramento, NP  Cardiologist:  Little Ishikawa, MD  Electrophysiologist:  None   Referring MD: Carmel Sacramento, NP   No chief complaint on file.   History of Present Illness:    Tabitha Smith is a 30 y.o. 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  She was admitted to [facility] on 09/02/2020.  She had underwent a root canal the day prior and took Tylenol with codeine prior to going to bed that night.  Following morning her sister found she was unarousable.  Possible seizure-like activity.  EMS was called, symptoms seem to improve with Narcan.  She was found to be in septic/hypovolemic shock on admission.  Hypotension, AKI, lactic acidosis resolved with IV fluids.  CTPA showed patchy airspace opacities likely representing aspiration pneumonia.  Echocardiogram showed LVEF 25 to 30%, global hypokinesis, moderate RV dysfunction, moderate MR, moderate TR.  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  Brain MRI showed multiple acute infarcts.  TEE was done on 09/06/2020, no evidence of embolic source of stroke.  Systolic function on TEE was normal.  Neurology was consulted and she was discharged on aspirin and Plavix.  Echocardiogram on 10/08/2020 showed LVEF 55 to 60%, normal RV function, no significant valvular disease.  Zio patch x13 days on 10/19/2020 showed no significant arrhythmias.   Since last clinic visit,     Past Medical History:  Diagnosis Date  . Migraine   . Sinusitis   . Stroke [facility])     Past Surgical History:  Procedure Laterality Date  . BUBBLE STUDY  09/06/2020   Procedure: BUBBLE STUDY;  Surgeon: Pricilla Riffle, MD;  Location: [facility] ENDOSCOPY;  Service: Cardiovascular;;  . NO PAST SURGERIES    . stitches to abdomen      for a cut   . TEE  WITHOUT CARDIOVERSION N/A 09/06/2020   Procedure: TRANSESOPHAGEAL ECHOCARDIOGRAM (TEE);  Surgeon: Pricilla Riffle, MD;  Location: [facility] ENDOSCOPY;  Service: Cardiovascular;  Laterality: N/A;    Current Medications: No outpatient medications have been marked as taking for the 12/14/20 encounter (Appointment) with Little Ishikawa, MD.     Allergies:   Amoxicillin and Penicillins   Social History   Socioeconomic History  . Marital status: Single    Spouse name: Not on file  . Number of children: 1  . Years of education: Not on file  . Highest education level: High school graduate  Occupational History  . Not on file  Tobacco Use  . Smoking status: Former Smoker    Packs/day: 0.10    Types: Cigarettes  . Smokeless tobacco: Never Used  . Tobacco comment: 1/2 pack a week now   Vaping Use  . Vaping Use: Never used  Substance and Sexual Activity  . Alcohol use: Yes    Comment: occasionally  . Drug use: Never  . Sexual activity: Not on file  Other Topics Concern  . Not on file  Social History Narrative   Lives at home with family   Left handed   Caffeine: coffee, 2-4 cups/day   Social Determinants of Health   Financial Resource Strain: Not on file  Food Insecurity: Not on file  Transportation Needs: Not on file  Physical Activity: Not on file  Stress: Not on file  Social Connections: Not on file     Family History: The patient's family history includes Cancer in her mother; Diabetes in her mother; Hypertension in her mother; Seizures in her mother. There is no history of Migraines or Headache.  ROS:   Please see the history of present illness.     All other systems reviewed and are negative.  EKGs/Labs/Other Studies Reviewed:    The following studies were reviewed today:   EKG:  EKG is ordered today.  The ekg ordered today demonstrates normal sinus rhythm, rate 70, no ST/T abnormalities, Q waves in V1/2 and III  Recent Labs: 09/06/2020: ALT 53 09/07/2020: BUN  6; Creatinine, Ser 0.93; Hemoglobin 12.3; Platelets 290; Potassium 3.4; Sodium 136  Recent Lipid Panel    Component Value Date/Time   CHOL 95 09/03/2020 0212   TRIG 35 09/03/2020 0212   HDL 39 (L) 09/03/2020 0212   CHOLHDL 2.4 09/03/2020 0212   VLDL 7 09/03/2020 0212   LDLCALC 49 09/03/2020 0212    Physical Exam:    VS:  There were no vitals taken for this visit.    Wt Readings from Last 3 Encounters:  09/15/20 177 lb 12.8 oz (80.6 kg)  09/07/20 176 lb 11.2 oz (80.2 kg)  06/14/20 185 lb (83.9 kg)     GEN:  Well nourished, well developed in no acute distress HEENT: Normal NECK: No JVD; No carotid bruits LYMPHATICS: No lymphadenopathy CARDIAC: RRR, no murmurs, rubs, gallops RESPIRATORY:  Clear to auscultation without rales, wheezing or rhonchi  ABDOMEN: Soft, non-tender, non-distended MUSCULOSKELETAL:  No edema; No deformity  SKIN: Warm and dry NEUROLOGIC:  Alert and oriented x 3 PSYCHIATRIC:  Normal affect   ASSESSMENT:    No diagnosis found. PLAN:    Acute combined systolic and diastolic heart failure: EF 25 to 30% during recent admission.  Likely secondary to septic shock.  Echocardiogram on 10/08/2020 showed LVEF 55 to 60%, normal RV function, no significant valvular disease.    Acute CVA: Unclear cause.  TEE did not show source of embolism.  Zio patch x13 days on 10/19/2020 showed no significant arrhythmias.  On aspirin/Plavix.  Has follow-up with neurology.  Palpitations: Zio patch x13 days on 10/19/2020 showed no significant arrhythmias.  RTC as needed   Medication Adjustments/Labs and Tests Ordered: Current medicines are reviewed at length with the patient today.  Concerns regarding medicines are outlined above.  No orders of the defined types were placed in this encounter.  No orders of the defined types were placed in this encounter.   There are no Patient Instructions on file for this visit.   Signed, Little Ishikawa, MD  12/12/2020 4:57 PM     [facility]

## 2020-12-14 ENCOUNTER — Ambulatory Visit: Payer: Medicaid Other | Admitting: Cardiology

## 2021-01-06 ENCOUNTER — Telehealth: Payer: Self-pay | Admitting: Neurology

## 2021-01-06 ENCOUNTER — Other Ambulatory Visit: Payer: Self-pay | Admitting: Neurology

## 2021-01-06 DIAGNOSIS — R519 Headache, unspecified: Secondary | ICD-10-CM

## 2021-01-06 MED ORDER — METHYLPREDNISOLONE 4 MG PO TBPK: ORAL_TABLET | ORAL | 1 refills | Status: DC

## 2021-01-06 NOTE — Telephone Encounter (Signed)
[name], can you call and schedule her for a headache follow up with amy or megan, first available hopefully in the next few weeks. Also switch that appointment in August (stroke follow up) to [place]. thanks

## 2021-01-06 NOTE — Telephone Encounter (Signed)
[facility] healthy blue pending uploaded notes on the portal 

## 2021-01-06 NOTE — Progress Notes (Signed)
[facility] 

## 2021-01-06 NOTE — Telephone Encounter (Signed)
LVM offering pt appointment with Tabitha Smith on 03/14 at 1:00. Advised pt to call back and let us know if she can come.

## 2021-01-10 ENCOUNTER — Ambulatory Visit: Payer: Medicaid Other | Admitting: Family Medicine

## 2021-01-10 ENCOUNTER — Encounter: Payer: Self-pay | Admitting: Family Medicine

## 2021-01-10 ENCOUNTER — Other Ambulatory Visit: Payer: Self-pay

## 2021-01-10 VITALS — BP 122/78 | HR 70 | Ht 66.0 in | Wt 172.0 lb

## 2021-01-10 DIAGNOSIS — G43711 Chronic migraine without aura, intractable, with status migrainosus: Secondary | ICD-10-CM | POA: Diagnosis not present

## 2021-01-10 DIAGNOSIS — I6340 Cerebral infarction due to embolism of unspecified cerebral artery: Secondary | ICD-10-CM | POA: Diagnosis not present

## 2021-01-10 MED ORDER — NURTEC 75 MG PO TBDP: 75.0000 mg | ORAL_TABLET | Freq: Every day | ORAL | 11 refills | Status: DC | PRN

## 2021-01-10 NOTE — Telephone Encounter (Signed)
mcd healthy blue Tabitha Smith: [id number] (exp. 01/06/21 to 02/05/21) order sent to GI. They will reach out to the patient to schedule.
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
## 2021-01-10 NOTE — Patient Instructions (Signed)
Below is our plan:  We will continue Amovig 70mg  every 2 weeks. I will try to get Nurtec covered for abortive therapy since triptan medications are contraindicated post stroke. Follow up asap with your primary care provider. [place] follow up with 06/15/2021. You may go to [facility] for head CT as ordered by Dr 06/17/2021.   Please make sure you are staying well hydrated. I recommend 50-60 ounces daily. Well balanced diet and regular exercise encouraged. Consistent sleep schedule with 6-8 hours recommended.   Please continue follow up with care team as directed.   Follow up with me for headache follow up in 1 year   You may receive a survey regarding today's visit. I encourage you to leave honest feed back as I do use this information to improve patient care. Thank you for seeing me today!      Migraine Headache A migraine headache is a very strong throbbing pain on one side or both sides of your head. This type of headache can also cause other symptoms. It can last from 4 hours to 3 days. Talk with your doctor about what things may bring on (trigger) this condition. What are the causes? The exact cause of this condition is not known. This condition may be triggered or caused by:  Drinking alcohol.  Smoking.  Taking medicines, such as: ? Medicine used to treat chest pain (nitroglycerin). ? Birth control pills. ? Estrogen. ? Some blood pressure medicines.  Eating or drinking certain products.  Doing physical activity. Other things that may trigger a migraine headache include:  Having a menstrual period.  Pregnancy.  Hunger.  Stress.  Not getting enough sleep or getting too much sleep.  Weather changes.  Tiredness (fatigue). What increases the risk?  Being 3-97 years old.  Being female.  Having a family history of migraine headaches.  Being Caucasian.  Having depression or anxiety.  Being very overweight. What are the signs or  symptoms?  A throbbing pain. This pain may: ? Happen in any area of the head, such as on one side or both sides. ? Make it hard to do daily activities. ? Get worse with physical activity. ? Get worse around bright lights or loud noises.  Other symptoms may include: ? Feeling sick to your stomach (nauseous). ? Vomiting. ? Dizziness. ? Being sensitive to bright lights, loud noises, or smells.  Before you get a migraine headache, you may get warning signs (an aura). An aura may include: ? Seeing flashing lights or having blind spots. ? Seeing bright spots, halos, or zigzag lines. ? Having tunnel vision or blurred vision. ? Having numbness or a tingling feeling. ? Having trouble talking. ? Having weak muscles.  Some people have symptoms after a migraine headache (postdromal phase), such as: ? Tiredness. ? Trouble thinking (concentrating). How is this treated?  Taking medicines that: ? Relieve pain. ? Relieve the feeling of being sick to your stomach. ? Prevent migraine headaches.  Treatment may also include: ? Having acupuncture. ? Avoiding foods that bring on migraine headaches. ? Learning ways to control your body functions (biofeedback). ? Therapy to help you know and deal with negative thoughts (cognitive behavioral therapy). Follow these instructions at home: Medicines  Take over-the-counter and prescription medicines only as told by your doctor.  Ask your doctor if the medicine prescribed to you: ? Requires you to avoid driving or using heavy machinery. ? Can cause trouble pooping (constipation). You may need to take these  steps to prevent or treat trouble pooping:  Drink enough fluid to keep your pee (urine) pale yellow.  Take over-the-counter or prescription medicines.  Eat foods that are high in fiber. These include beans, whole grains, and fresh fruits and vegetables.  Limit foods that are high in fat and sugar. These include fried or sweet  foods. Lifestyle  Do not drink alcohol.  Do not use any products that contain nicotine or tobacco, such as cigarettes, e-cigarettes, and chewing tobacco. If you need help quitting, ask your doctor.  Get at least 8 hours of sleep every night.  Limit and deal with stress. General instructions  Keep a journal to find out what may bring on your migraine headaches. For example, write down: ? What you eat and drink. ? How much sleep you get. ? Any change in what you eat or drink. ? Any change in your medicines.  If you have a migraine headache: ? Avoid things that make your symptoms worse, such as bright lights. ? It may help to lie down in a dark, quiet room. ? Do not drive or use heavy machinery. ? Ask your doctor what activities are safe for you.  Keep all follow-up visits as told by your doctor. This is important.      Contact a doctor if:  You get a migraine headache that is different or worse than others you have had.  You have more than 15 headache days in one month. Get help right away if:  Your migraine headache gets very bad.  Your migraine headache lasts longer than 72 hours.  You have a fever.  You have a stiff neck.  You have trouble seeing.  Your muscles feel weak or like you cannot control them.  You start to lose your balance a lot.  You start to have trouble walking.  You pass out (faint).  You have a seizure. Summary  A migraine headache is a very strong throbbing pain on one side or both sides of your head. These headaches can also cause other symptoms.  This condition may be treated with medicines and changes to your lifestyle.  Keep a journal to find out what may bring on your migraine headaches.  Contact a doctor if you get a migraine headache that is different or worse than others you have had.  Contact your doctor if you have more than 15 headache days in a month. This information is not intended to replace advice given to you by your  health care provider. Make sure you discuss any questions you have with your health care provider. Document Revised: 02/07/2019 Document Reviewed: 11/28/2018 Elsevier Patient Education  2021 [employer].

## 2021-01-11 ENCOUNTER — Other Ambulatory Visit: Payer: Self-pay

## 2021-01-11 ENCOUNTER — Encounter: Payer: Self-pay | Admitting: Cardiology

## 2021-01-11 ENCOUNTER — Ambulatory Visit (INDEPENDENT_AMBULATORY_CARE_PROVIDER_SITE_OTHER): Payer: Medicaid Other | Admitting: Cardiology

## 2021-01-11 ENCOUNTER — Other Ambulatory Visit: Payer: Self-pay | Admitting: Neurology

## 2021-01-11 VITALS — BP 122/74 | HR 84 | Ht 66.0 in | Wt 177.6 lb

## 2021-01-11 DIAGNOSIS — R002 Palpitations: Secondary | ICD-10-CM

## 2021-01-11 DIAGNOSIS — I639 Cerebral infarction, unspecified: Secondary | ICD-10-CM

## 2021-01-11 DIAGNOSIS — I5021 Acute systolic (congestive) heart failure: Secondary | ICD-10-CM

## 2021-01-11 DIAGNOSIS — I8290 Acute embolism and thrombosis of unspecified vein: Secondary | ICD-10-CM

## 2021-01-11 DIAGNOSIS — I749 Embolism and thrombosis of unspecified artery: Secondary | ICD-10-CM

## 2021-01-11 NOTE — Patient Instructions (Addendum)
Medication Instructions:  Your physician recommends that you continue on your current medications as directed. Please refer to the Current Medication list given to you today.  Follow-Up: At [facility], you and your health needs are our priority.  As part of our continuing mission to provide you with exceptional heart care, we have created designated Provider Care Teams.  These Care Teams include your primary Cardiologist (physician) and Advanced Practice Providers (APPs -  Physician Assistants and Nurse Practitioners) who all work together to provide you with the care you need, when you need it.  We recommend signing up for the patient portal called "MyChart".  Sign up information is provided on this After Visit Summary.  MyChart is used to connect with patients for Virtual Visits (Telemedicine).  Patients are able to view lab/test results, encounter notes, upcoming appointments, etc.  Non-urgent messages can be sent to your provider as well.   To learn more about what you can do with MyChart, go to [email or web address].    Your next appointment:   AS NEEDED with Dr. Schumann 
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
## 2021-01-12 ENCOUNTER — Telehealth: Payer: Self-pay | Admitting: *Deleted

## 2021-01-12 NOTE — Telephone Encounter (Signed)
I called pt and she states that she was taking after she left hospital. She thinks she quit when prescription ended, in January??  I relayed to restart taking aspirin 81mg  daily.  I relayed to come in and get labs, no appt needed, M-th 8-12, 1330-1700.  I will let her know in mychart too (she requested since her memory not to good).  She appreciated call.

## 2021-01-12 NOTE — Telephone Encounter (Signed)
-----   Message from Anson Fret, MD sent at 01/11/2021  4:55 PM EDT ----- Patient needs her cardiolipin antibodies repeated, I have placed that order. She should also be on daily baby aspirin per Dr. Marlis Edelson last note, I read through the chart and I don;t see where anyone discontinued it.  Andrey Campanile - will you call patient and ask if someone told her to stop the aspirin? Also ask her to come in and repeat that blood test at her convenience? thanks   ----- Message ----- From: Shawnie Dapper, NP Sent: 01/10/2021   4:45 PM EDT To: Anson Fret, MD

## 2021-01-18 ENCOUNTER — Telehealth: Payer: Self-pay

## 2021-01-18 NOTE — Telephone Encounter (Signed)
I have submitted a PA request for Nurtec on CMM, Key: Key: [id number].  Awaiting determination from [facility].

## 2021-01-22 ENCOUNTER — Inpatient Hospital Stay: Admission: RE | Admit: 2021-01-22 | Payer: Medicaid Other | Source: Ambulatory Visit

## 2021-02-03 NOTE — Telephone Encounter (Signed)
I called pt and she was able to get the aimovig 70mg /ml twice monthly. I relayed that the nurtec not approved due to > 15 migraines per month.  Hopefully the aimovig will bring that down and then will be able to try again with the nurtec authorization approval.  She appreciated call and verbalized understanding.  Will call back as needed.

## 2021-02-03 NOTE — Telephone Encounter (Signed)
Per [employer], Nurtec is denied.   They may consider approval if the patient has a headache frequency of LESS than 15 headache days per month during the prior 6 months.   Per last OV note, patient has baseline 23 headache days per month.

## 2021-02-03 NOTE — Telephone Encounter (Signed)
Please let her know that Nurtec is not covered due to having > 15 headache days per month. Was she able to get Amovig 70mg  twice weekly? I am hopeful this will reduce number of headache days and then we may be able to get Nurtec covered. For now she will need to use Tylenol. Triptans and Nsaids contraindicated.

## 2021-05-20 ENCOUNTER — Telehealth: Payer: Self-pay

## 2021-05-20 ENCOUNTER — Encounter: Payer: Self-pay | Admitting: Gynecologic Oncology

## 2021-05-20 NOTE — Telephone Encounter (Signed)
Spoke with Tabitha Smith regarding her referral to GYN oncology. She has an appointment scheduled with Dr. Pricilla Holm on 05/24/21 at 10:30 am. Patient is in agreement of date and time. She has been provided with office address and location. She is also aware of our mask and visitor policy. Patient verbalized understanding and will call with any questions.

## 2021-05-24 ENCOUNTER — Inpatient Hospital Stay: Payer: Medicaid Other | Attending: Gynecologic Oncology | Admitting: Gynecologic Oncology

## 2021-05-24 ENCOUNTER — Other Ambulatory Visit: Payer: Self-pay

## 2021-05-24 ENCOUNTER — Telehealth: Payer: Self-pay | Admitting: Family Medicine

## 2021-05-24 ENCOUNTER — Encounter: Payer: Self-pay | Admitting: Gynecologic Oncology

## 2021-05-24 VITALS — BP 147/93 | HR 100 | Temp 98.6°F | Resp 20 | Ht 66.0 in | Wt 182.0 lb

## 2021-05-24 DIAGNOSIS — F17210 Nicotine dependence, cigarettes, uncomplicated: Secondary | ICD-10-CM | POA: Insufficient documentation

## 2021-05-24 DIAGNOSIS — K589 Irritable bowel syndrome without diarrhea: Secondary | ICD-10-CM | POA: Diagnosis not present

## 2021-05-24 DIAGNOSIS — K5900 Constipation, unspecified: Secondary | ICD-10-CM | POA: Diagnosis not present

## 2021-05-24 DIAGNOSIS — N939 Abnormal uterine and vaginal bleeding, unspecified: Secondary | ICD-10-CM | POA: Diagnosis not present

## 2021-05-24 DIAGNOSIS — Z8673 Personal history of transient ischemic attack (TIA), and cerebral infarction without residual deficits: Secondary | ICD-10-CM | POA: Diagnosis not present

## 2021-05-24 DIAGNOSIS — N83201 Unspecified ovarian cyst, right side: Secondary | ICD-10-CM

## 2021-05-24 DIAGNOSIS — Z79899 Other long term (current) drug therapy: Secondary | ICD-10-CM | POA: Insufficient documentation

## 2021-05-24 DIAGNOSIS — N83202 Unspecified ovarian cyst, left side: Secondary | ICD-10-CM | POA: Diagnosis present

## 2021-05-24 DIAGNOSIS — R6881 Early satiety: Secondary | ICD-10-CM | POA: Diagnosis not present

## 2021-05-24 DIAGNOSIS — Z800 Family history of malignant neoplasm of digestive organs: Secondary | ICD-10-CM | POA: Insufficient documentation

## 2021-05-24 DIAGNOSIS — R102 Pelvic and perineal pain: Secondary | ICD-10-CM | POA: Diagnosis not present

## 2021-05-24 DIAGNOSIS — Z7982 Long term (current) use of aspirin: Secondary | ICD-10-CM | POA: Diagnosis not present

## 2021-05-24 DIAGNOSIS — F419 Anxiety disorder, unspecified: Secondary | ICD-10-CM | POA: Insufficient documentation

## 2021-05-24 DIAGNOSIS — Z7902 Long term (current) use of antithrombotics/antiplatelets: Secondary | ICD-10-CM | POA: Insufficient documentation

## 2021-05-24 DIAGNOSIS — Z791 Long term (current) use of non-steroidal anti-inflammatories (NSAID): Secondary | ICD-10-CM | POA: Insufficient documentation

## 2021-05-24 DIAGNOSIS — R3915 Urgency of urination: Secondary | ICD-10-CM | POA: Insufficient documentation

## 2021-05-24 DIAGNOSIS — R350 Frequency of micturition: Secondary | ICD-10-CM | POA: Insufficient documentation

## 2021-05-24 DIAGNOSIS — N9489 Other specified conditions associated with female genital organs and menstrual cycle: Secondary | ICD-10-CM | POA: Insufficient documentation

## 2021-05-24 MED ORDER — TRAMADOL HCL 50 MG PO TABS: 50.0000 mg | ORAL_TABLET | Freq: Two times a day (BID) | ORAL | 0 refills | Status: DC | PRN
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
## 2021-05-24 NOTE — Telephone Encounter (Signed)
Pt called, need earlier appt before surgery on 8/16. Would like a call from the nurse.

## 2021-05-24 NOTE — Patient Instructions (Signed)
It was a pleasure meeting you today.  Once I have your ultrasound results and have seen the images, I will give you a call to talk about any change in the plan we discussed today.  We will tentatively plan for surgery to remove the larger cyst or the ovary on 8/16.  In the meantime, we will get clearance from your neurologist as well as your primary care provider given your medical history.  If it is deemed safe to proceed, then we will plan for surgery as scheduled in mid August.  Given your abnormal bleeding, I will plan to sample the lining of your uterus at the time of surgery.  If the ultrasound raises concern for a possible cancer of the ovary, then we will discuss by phone getting some additional blood test prior to surgery.  This can be done at the time of your presurgery visit with my nurse practitioner, Warner Mccreedy.  I have sent in a prescription for tramadol, a pain medication, for you to use.  My recommendation, given what we discussed today, would be that you use this at night to help you to sleep.  You might also want to try some sleep aids such as melatonin or Benadryl.  Please call me if you develop sharp pain, especially on the right side.  This might be a sign that your ovary is twisting on its blood supply.  You have any questions or concerns, please call the office at [phone].

## 2021-05-24 NOTE — Telephone Encounter (Signed)
Called pt. Scheduled earlier appt w/ Jessica,NP 7/28 at 1:15pm. She was told she needed to be cleared for surgery that is scheduled for 06/14/21. Cx appt that was for 06/15/21 w/ Shanda Bumps.

## 2021-05-25 ENCOUNTER — Encounter: Payer: Self-pay | Admitting: Oncology

## 2021-05-25 NOTE — Progress Notes (Signed)
Faxed surgical risk stratification/optimization form successfully to patient's PCP Carmel Sacramento, NP at [facility] at [phone] and to Ihor Austin, NP at [facility] at [phone].

## 2021-05-26 ENCOUNTER — Telehealth: Payer: Self-pay | Admitting: *Deleted

## 2021-05-26 ENCOUNTER — Ambulatory Visit: Payer: Medicaid Other | Admitting: Adult Health

## 2021-05-26 ENCOUNTER — Encounter: Payer: Self-pay | Admitting: Adult Health

## 2021-05-26 VITALS — BP 132/85 | HR 88 | Ht 65.0 in | Wt 189.2 lb

## 2021-05-26 DIAGNOSIS — R760 Raised antibody titer: Secondary | ICD-10-CM

## 2021-05-26 DIAGNOSIS — I69319 Unspecified symptoms and signs involving cognitive functions following cerebral infarction: Secondary | ICD-10-CM | POA: Diagnosis not present

## 2021-05-26 DIAGNOSIS — Z01818 Encounter for other preprocedural examination: Secondary | ICD-10-CM

## 2021-05-26 DIAGNOSIS — I6340 Cerebral infarction due to embolism of unspecified cerebral artery: Secondary | ICD-10-CM

## 2021-05-26 NOTE — Telephone Encounter (Signed)
Received form from GYN Oncology, [facility] re: surgery clearance form. Placed on Amy Lomax NP's desk for completion, signature.

## 2021-05-26 NOTE — Telephone Encounter (Signed)
Formed reviewed and signed. Faxed back to [phone], receipt confirmed

## 2021-05-26 NOTE — Progress Notes (Signed)
[name] [address] [place]. [place] [address] [phone]       HOSPITAL FOLLOW UP NOTE  [name] Date of Birth:  February 03, 1991 Medical Record Number:  [id number]   Reason for Referral:  hospital stroke follow up    SUBJECTIVE:   CHIEF COMPLAINT:  Chief Complaint  Patient presents with   Stroke    Rm 3, one year FU, surgery clearance 06/14/21  "only lingering problem is terrible memory"    HPI:   [name] scheduled today's visit to request surgical clearance for removal of ovarian cyst currently scheduled on 06/14/2021.  She did not have hospital stroke follow-up but is routinely followed in this office by Dr. Lucia Gaskins and Shawnie Dapper, NP for migraines.  Information regarding hospital stroke admission below.  She reports she has been stable from stroke standpoint without new or reoccurring stroke/TIA symptoms.  Did have follow-up with cardiology with repeat echo 10/08/2020 showing normal EF.  She did complete 13 day cardiac monitor which was negative for atrial fibrillation. She has continued on aspirin without associated side effects. Does continue to experience short term memory concerns - denies worsening but denies improvement. Will forget to do certain things she was asked to do or when going to grocery store will forget what she was supposed to get. She works with [employer] for [employer] - denies any difficulty with this job as she just types out what the customer needs and the computer will prompt her the rest of the way.  No further concerns at this time   Stroke admission [name] is a 30 y.o. female with history of migraines, depression and IBS w/ root canal the day prior to admission found unresponsive in her bed, incontinent w/ tongue bite on 09/02/2020.  Personally reviewed hospitalization pertinent progress notes, lab work and imaging with summary provided.  Evaluated by Dr. 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  TEE without evidence of clot or PFO.  Hypercoagulable work-up showed slightly elevated anticardiolipin IgM 19 possibly acute phase reactant and recommended repeat at follow-up visit.  Initiated DAPT for 3 weeks then aspirin.  Other stroke risk factors include questionable substance abuse with UDS positive for opiates (possibly in setting of receiving Tylenol #3 following [place] the day prior although responded to Narcan administered by EMS), tobacco use, EtOH use, and migraines on Aimovig followed by Dr. Lucia Gaskins.  Other active problems include LLL PNA/septic shock, leukocytosis, depression, transaminitis, AKI and anemia.    Diagnostic imaging  Echocardiogram 09/02/2020 IMPRESSIONS   1. Left ventricular ejection fraction, by estimation, is 25 to 30%. The  left ventricle has severely decreased function. The left ventricle  demonstrates global hypokinesis. Left ventricular diastolic parameters are  consistent with Grade I diastolic  dysfunction (impaired relaxation).   2. Right ventricular systolic function is moderately reduced. The right  ventricular size is mildly enlarged. There is normal pulmonary artery  systolic pressure. The estimated right ventricular systolic pressure is  29.0 mmHg.   3. The pericardial effusion is posterior to the left ventricle. There is  no evidence of cardiac tamponade.   4. The mitral valve is normal in structure. Moderate mitral valve  regurgitation. No evidence of mitral stenosis.   5. Tricuspid valve regurgitation is moderate.   6. The aortic valve is normal in structure.  Aortic valve regurgitation is  not visualized. No aortic stenosis is present.   7. The inferior vena cava is dilated in size with <50% respiratory  variability, suggesting right atrial pressure of 15 mmHg.    10/08/2020  1. Left ventricular ejection fraction, by estimation, is 55 to 60%. Left  ventricular ejection fraction by 3D volume is 57 %. The left ventricle has  normal function. The left ventricle has no regional wall motion  abnormalities. Left ventricular diastolic   parameters are indeterminate. The average left ventricular global  longitudinal strain is -20.5 %. The global longitudinal strain is normal.   2. Right ventricular systolic function is normal. The right ventricular  size is normal. Tricuspid regurgitation signal is inadequate for assessing  PA pressure.   3. The mitral valve is normal in structure. Trivial mitral valve  regurgitation. No evidence of mitral stenosis.   4. The aortic valve is normal in structure. Aortic valve regurgitation is  not visualized. No aortic stenosis is present.   5. The inferior vena cava is normal in size with greater than 50%  respiratory variability, suggesting right atrial pressure of 3 mmHg.    MR BRAIN 09/02/2020 IMPRESSION: Multifocal acute infarcts involving the left occipitotemporal region and left cerebellum.   CTA HEAD/NECK 09/03/2020 IMPRESSION: 1. No large vessel occlusion or significant stenosis in the head and neck. 2. Patchy ground-glass opacities in the left lung, more fully evaluated on yesterday's chest CTA. Small bilateral pleural effusions.      ROS:   14 system review of systems performed and negative with exception of those listed in HPI  PMH:  Past Medical History:  Diagnosis Date   History of acute heart failure    IBS (irritable bowel syndrome)    Migraine    Sinusitis    Stroke (HCC)     PSH:  Past Surgical History:  Procedure Laterality Date   BUBBLE STUDY  09/06/2020   Procedure: BUBBLE STUDY;  Surgeon: Pricilla Riffle, MD;  Location: [facility] ENDOSCOPY;  Service: Cardiovascular;;   NO PAST SURGERIES     stitches to abdomen      for a cut    TEE WITHOUT CARDIOVERSION N/A 09/06/2020   Procedure:  TRANSESOPHAGEAL ECHOCARDIOGRAM (TEE);  Surgeon: Pricilla Riffle, MD;  Location: [facility] ENDOSCOPY;  Service: Cardiovascular;  Laterality: N/A;    Social History:  Social History   Socioeconomic History   Marital status: Single    Spouse name: Not on file   Number of children: 1   Years of education: Not on file   Highest education level: High school graduate  Occupational History   Not on file  Tobacco Use   Smoking status: Former    Packs/day: 0.10    Types: Cigarettes   Smokeless tobacco: Never   Tobacco comments:    1/2 pack a week now   Vaping Use   Vaping Use: Never used  Substance and Sexual Activity   Alcohol use: Yes    Comment: occasionally   Drug use: Never   Sexual activity: Yes    Birth control/protection: None  Other Topics Concern   Not on file  Social History Narrative   Lives at home with family   Left handed   Caffeine: coffee, 2-4 cups/day   Social Determinants of Health   Financial Resource Strain: Not on file  Food Insecurity: Not on file  Transportation Needs: Not on file  Physical Activity: Not on file  Stress: Not on file  Social  Connections: Not on file  Intimate Partner Violence: Not on file    Family History:  Family History  Problem Relation Age of Onset   Diabetes Mother    Seizures Mother    Cancer Mother        Colon   Hypertension Mother    Migraines Neg Hx    Headache Neg Hx    Breast cancer Neg Hx    Ovarian cancer Neg Hx    Endometrial cancer Neg Hx    Pancreatic cancer Neg Hx    Prostate cancer Neg Hx     Medications:   Current Outpatient Medications on File Prior to Visit  Medication Sig Dispense Refill   DULoxetine (CYMBALTA) 60 MG capsule Take 120 mg by mouth daily.     Erenumab-aooe (AIMOVIG) 70 MG/ML SOAJ Inject 70 mg into the skin every 14 (fourteen) days. 1.12 mL 6   linaclotide (LINZESS) 145 MCG CAPS capsule Take 145 mcg by mouth every other day.     prazosin (MINIPRESS) 1 MG capsule Take 1 mg by mouth 2 (two)  times daily.     QUEtiapine Fumarate (SEROQUEL XR) 150 MG 24 hr tablet Take 150 mg by mouth at bedtime.     traMADol (ULTRAM) 50 MG tablet Take 1 tablet (50 mg total) by mouth every 12 (twelve) hours as needed for up to 20 doses. 20 tablet 0   zolpidem (AMBIEN) 10 MG tablet Take 10 mg by mouth at bedtime.     No current facility-administered medications on file prior to visit.    Allergies:   Allergies  Allergen Reactions   Amoxicillin Anaphylaxis   Penicillins Anaphylaxis    Has patient had a PCN reaction causing immediate rash, facial/tongue/throat swelling, SOB or lightheadedness with hypotension: Yes Has patient had a PCN reaction causing severe rash involving mucus membranes or skin necrosis: Unk Has patient had a PCN reaction that required hospitalization: No Has patient had a PCN reaction occurring within the last 10 years: No If all of the above answers are "NO", then may proceed with Cephalosporin use.       OBJECTIVE:  Physical Exam  Vitals:   05/26/21 1303  BP: 132/85  Pulse: 88  Weight: 189 lb 3.2 oz (85.8 kg)  Height: 5\' 5"  (1.651 m)   Body mass index is 31.48 kg/m. No results found.  No flowsheet data found.   General: well developed, well nourished, very pleasant young African-American female, seated, in no evident distress Head: head normocephalic and atraumatic.   Neck: supple with no carotid or supraclavicular bruits Cardiovascular: regular rate and rhythm, no murmurs Musculoskeletal: no deformity Skin:  no rash/petichiae Vascular:  Normal pulses all extremities   Neurologic Exam Mental Status: Awake and fully alert.  Fluent speech and language.  Oriented to place and time. Recent memory subjectively impaired and remote memory intact. Attention span, concentration and fund of knowledge appropriate during visit. Mood and affect appropriate.  Cranial Nerves: Fundoscopic exam reveals sharp disc margins. Pupils equal, briskly reactive to light.  Extraocular movements full without nystagmus. Visual fields full to confrontation. Hearing intact. Facial sensation intact. Face, tongue, palate moves normally and symmetrically.  Motor: Normal bulk and tone. Normal strength in all tested extremity muscles Sensory.: intact to touch , pinprick , position and vibratory sensation.  Coordination: Rapid alternating movements normal in all extremities. Finger-to-nose and heel-to-shin performed accurately bilaterally. Gait and Station: Arises from chair without difficulty. Stance is normal. Gait demonstrates normal stride length and balance without  use of assistive device. Tandem walk and heel toe without difficulty.  Reflexes: 1+ and symmetric. Toes downgoing.     NIHSS  0 Modified Rankin  1      ASSESSMENT: Tabitha Smith is a 30 y.o. year old female with history of multifocal left occipital temporal and left cerebellar infarcts on 09/02/2020, embolic most likely secondary to new cardiomyopathy (thought due to demand ischemia) with EF 25 to 30%. Vascular risk factors include new onset cardiomyopathy, acute combined systolic and diastolic CHF, questionable substance abuse, tobacco use, EtOH use and migraines.      PLAN:  Surgical clearance: Patient is requesting clearance to proceed with surgical procedure for ovarian cyst tentatively scheduled on 06/14/2021.  She has been stable from stroke standpoint, she is cleared to proceed with surgical procedure from a stroke standpoint holding aspirin 3 to 5 days prior with small but acceptable preprocedure risk of recurrent stroke while off therapy.  Recommend restarting aspirin immediately after or once stable.  This was further discussed with patient and verbalized understanding. Ischemic embolic stroke:  Residual deficit: Short-term memory loss complaints -referral placed to neuro rehab speech therapy.  Also discussed routine memory exercises and managing stress levels.   Continue aspirin 81 mg daily  for secondary stroke prevention.   Discussed secondary stroke prevention measures and importance of close PCP follow up for aggressive stroke risk factor management. I have gone over the pathophysiology of stroke, warning signs and symptoms, risk factors and their management in some detail with instructions to go to the closest emergency room for symptoms of concern. Elevated anticardiolipin IgM: Repeat lab work today Cardiomyopathy, CHF: Repeat 2D echo in 09/2020 showed EF 55 to 60% -cleared by cardiology Migraines: Routinely followed by Dr. Lucia Gaskins - not discussed during visit    Continue to follow with Dr. Lucia Gaskins and and Amy, NP for migraine and memory    CC:  [facility] provider: Dr. Pearlean Brownie PCP: Carmel [place], NP    I spent 46 minutes of face-to-face and non-face-to-face time with patient.  This included previsit chart review including review of recent hospitalization, lab review, study review, order entry, electronic health record documentation, patient education regarding prior stroke, secondary stroke prevention measures and aggressive stroke risk factor management, residual deficits and typical recovery time, surgical clearance and answered all questions to patient satisfaction   Ihor Austin, AGNP-BC  [facility] [address] [place], [place] [address]  Phone [phone] Fax [phone] Note: This document was prepared with digital dictation and possible smart phrase technology. Any transcriptional errors that result from this process are unintentional.

## 2021-05-26 NOTE — Patient Instructions (Signed)
You are cleared to proceed with surgical procedure with small but acceptable preprocedure risk of recurrent stroke while off aspirin.  Recommend restarting immediately after or once stable.  In regards to duration, we typically recommend 3 to 5 days but your surgeon will ultimately decide timeframe and let you know  We will repeat lab work today that was slightly elevated during admission  Continue aspirin 81 mg daily for secondary stroke prevention  Referral placed to neuro rehab speech therapy for cognitive therapy -please call them in the beginning of next week to schedule initial evaluation    Continue to follow with Dr. Lucia Gaskins for migraine management and monitoring   Thank you for coming to see [place] at [facility]. I hope we have been able to provide you high quality care today.  You may receive a patient satisfaction survey over the next few weeks. We would appreciate your feedback and comments so that we may continue to improve ourselves and the health of our patients.

## 2021-05-26 NOTE — Progress Notes (Signed)
I agree with the above plan 

## 2021-05-27 ENCOUNTER — Telehealth: Payer: Self-pay | Admitting: Neurology

## 2021-05-27 LAB — CARDIOLIPIN ANTIBODIES, IGG, IGM, IGA
Anticardiolipin IgA: <9 APL U/mL (ref 0–11)
Anticardiolipin IgG: 11 GPL U/mL (ref 0–14)
Anticardiolipin IgM: 28 MPL U/mL — ABNORMAL HIGH (ref 0–12)

## 2021-05-27 NOTE — Telephone Encounter (Signed)
error 

## 2021-05-30 ENCOUNTER — Telehealth: Payer: Self-pay | Admitting: Gynecologic Oncology

## 2021-05-30 ENCOUNTER — Other Ambulatory Visit: Payer: Self-pay | Admitting: Adult Health

## 2021-05-30 ENCOUNTER — Encounter: Payer: Self-pay | Admitting: Gynecologic Oncology

## 2021-05-30 ENCOUNTER — Ambulatory Visit (HOSPITAL_COMMUNITY)
Admission: RE | Admit: 2021-05-30 | Discharge: 2021-05-30 | Disposition: A | Discharge status: Home / Self Care | Payer: Medicaid Other | Source: Ambulatory Visit | Attending: Gynecologic Oncology | Admitting: Gynecologic Oncology

## 2021-05-30 ENCOUNTER — Telehealth: Payer: Self-pay

## 2021-05-30 ENCOUNTER — Other Ambulatory Visit: Payer: Self-pay

## 2021-05-30 DIAGNOSIS — N9489 Other specified conditions associated with female genital organs and menstrual cycle: Secondary | ICD-10-CM | POA: Diagnosis present

## 2021-05-30 DIAGNOSIS — R760 Raised antibody titer: Secondary | ICD-10-CM

## 2021-05-30 DIAGNOSIS — I6340 Cerebral infarction due to embolism of unspecified cerebral artery: Secondary | ICD-10-CM

## 2021-05-30 NOTE — Telephone Encounter (Signed)
Referral for hematology sent to [facility]. P: [phone].

## 2021-05-30 NOTE — Telephone Encounter (Signed)
I called the patient and reviewed recent pelvic ultrasound.  This reveals a 3.4 cm dominant follicle on the right ovary and a resolving corpus luteum on the left.  No significant adnexal cysts otherwise.  Reassuring ultrasound.  I do not recommend moving forward with surgery as we had previously discussed given this ultrasound exam.  Findings are very normal for someone who is premenopausal.  I do recommend giving her abnormal bleeding that we proceed with endometrial biopsy.  We discussed that this could be done in clinic versus under anesthesia.  The patient voices significant anxiety about trying to do this in the clinic setting.  I am happy to move forward with doing it under some light anesthesia in the outpatient surgical setting.  Patient's can to think about this and let me know.  We also discussed possible interventions that may help prevent formation of larger ovarian cyst as well as decrease or stop her uterine bleeding.  Given her history, I would not recommend an estrogen containing birth control.  We discussed that a Mirena IUD could help accomplish both of these goals.  While a Mirena IUD does not always prevent ovulation, it can in some patients.  I will keep the patient's tentative surgical date for later this month for uterine sampling.  She said she would call to let me know when she is ready to move forward.  Eugene Garnet MD Gynecologic Oncology

## 2021-06-02 ENCOUNTER — Telehealth: Payer: Self-pay | Admitting: Physician Assistant

## 2021-06-02 NOTE — Telephone Encounter (Signed)
Received a new hem referral from [facility]. Tabitha Smith has been cld and scheduled to see Tabitha Smith on 8/18 at 2pm. Pt aware to arrive 20 minutes early.

## 2021-06-03 ENCOUNTER — Telehealth: Payer: Self-pay | Admitting: Oncology

## 2021-06-03 NOTE — Telephone Encounter (Signed)
Called Tabitha Smith and she would like a biopsy under anesthesia on 06/14/21.

## 2021-06-06 NOTE — Progress Notes (Signed)
DUE TO COVID-19 ONLY ONE VISITOR IS ALLOWED TO COME WITH YOU AND STAY IN THE WAITING ROOM ONLY DURING PRE OP AND PROCEDURE DAY OF SURGERY. THE 1 VISITOR  MAY VISIT WITH YOU AFTER SURGERY IN YOUR PRIVATE ROOM DURING VISITING HOURS ONLY!  YOU NEED TO HAVE A COVID 19 TEST ON_______ @_______ , THIS TEST MUST BE DONE BEFORE SURGERY,  COVID TESTING SITE [address] [place] , IT IS ON THE RIGHT GOING OUT [address] APPROXIMATELY  2 MINUTES PAST [employer] ON THE RIGHT. ONCE YOUR COVID TEST IS COMPLETED,  PLEASE BEGIN THE QUARANTINE INSTRUCTIONS AS OUTLINED IN YOUR HANDOUT.                Tabitha Smith  06/06/2021   Your procedure is scheduled on:        06/14/2021   Report to [facility] Main  Entrance   Report to admitting at    12 noon    Call this number if you have problems the morning of surgery [phone]    Remember: Do not eat food , candy gum or mints :After Midnight. You may have clear liquids from midnight until  1100am    CLEAR LIQUID DIET   Foods Allowed                                                                       Coffee and tea, regular and decaf                              Plain Jell-O any favor except red or purple                                            Fruit ices (not with fruit pulp)                                      Iced Popsicles                                     Carbonated beverages, regular and diet                                    Cranberry, grape and apple juices Sports drinks like Gatorade Lightly seasoned clear broth or consume(fat free) Sugar, honey syrup   _____________________________________________________________________    BRUSH YOUR TEETH MORNING OF SURGERY AND RINSE YOUR MOUTH OUT, NO CHEWING GUM CANDY OR MINTS.     Take these medicines the morning of surgery with A SIP OF WATER: Cymbalta, Minipress   DO NOT TAKE ANY DIABETIC MEDICATIONS DAY OF YOUR SURGERY                                You may not have  any metal on your body including hair pins and              piercings  Do not wear jewelry, make-up, lotions, powders or perfumes, deodorant             Do not wear nail polish on your fingernails.  Do not shave  48 hours prior to surgery.              Men may shave face and neck.   Do not bring valuables to the hospital. [name].  Contacts, dentures or bridgework may not be worn into surgery.  Leave suitcase in the car. After surgery it may be brought to your room.     Patients discharged the day of surgery will not be allowed to drive home. IF YOU ARE HAVING SURGERY AND GOING HOME THE SAME DAY, YOU MUST HAVE AN ADULT TO DRIVE YOU HOME AND BE WITH YOU FOR 24 HOURS. YOU MAY GO HOME BY TAXI OR UBER OR ORTHERWISE, BUT AN ADULT MUST ACCOMPANY YOU HOME AND STAY WITH YOU FOR 24 HOURS.  Name and phone number of your driver:  Special Instructions: N/A              Please read over the following fact sheets you were given: _____________________________________________________________________  [facility] - Preparing for Surgery Before surgery, you can play an important role.  Because skin is not sterile, your skin needs to be as free of germs as possible.  You can reduce the number of germs on your skin by washing with CHG (chlorahexidine gluconate) soap before surgery.  CHG is an antiseptic cleaner which kills germs and bonds with the skin to continue killing germs even after washing. Please DO NOT use if you have an allergy to CHG or antibacterial soaps.  If your skin becomes reddened/irritated stop using the CHG and inform your nurse when you arrive at Short Stay. Do not shave (including legs and underarms) for at least 48 hours prior to the first CHG shower.  You may shave your face/neck. Please follow these instructions carefully:  1.  Shower with CHG Soap the night before surgery and the  morning of Surgery.  2.  If  you choose to wash your hair, wash your hair first as usual with your  normal  shampoo.  3.  After you shampoo, rinse your hair and body thoroughly to remove the  shampoo.                           4.  Use CHG as you would any other liquid soap.  You can apply chg directly  to the skin and wash                       Gently with a scrungie or clean washcloth.  5.  Apply the CHG Soap to your body ONLY FROM THE NECK DOWN.   Do not use on face/ open                           Wound or open sores. Avoid contact with eyes, ears mouth and genitals (private parts).                       Wash  face,  Genitals (private parts) with your normal soap.             6.  Wash thoroughly, paying special attention to the area where your surgery  will be performed.  7.  Thoroughly rinse your body with warm water from the neck down.  8.  DO NOT shower/wash with your normal soap after using and rinsing off  the CHG Soap.                9.  Pat yourself dry with a clean towel.            10.  Wear clean pajamas.            11.  Place clean sheets on your bed the night of your first shower and do not  sleep with pets. Day of Surgery : Do not apply any lotions/deodorants the morning of surgery.  Please wear clean clothes to the hospital/surgery center.  FAILURE TO FOLLOW THESE INSTRUCTIONS MAY RESULT IN THE CANCELLATION OF YOUR SURGERY PATIENT SIGNATURE_________________________________  NURSE SIGNATURE__________________________________  ________________________________________________________________________

## 2021-06-08 ENCOUNTER — Inpatient Hospital Stay: Payer: Medicaid Other

## 2021-06-08 ENCOUNTER — Inpatient Hospital Stay: Payer: Medicaid Other | Attending: Gynecologic Oncology | Admitting: Gynecologic Oncology

## 2021-06-08 ENCOUNTER — Encounter (HOSPITAL_COMMUNITY): Payer: Self-pay

## 2021-06-08 ENCOUNTER — Other Ambulatory Visit: Payer: Self-pay

## 2021-06-08 ENCOUNTER — Encounter (HOSPITAL_COMMUNITY): Admission: RE | Admit: 2021-06-08 | Payer: Medicaid Other | Source: Ambulatory Visit

## 2021-06-08 ENCOUNTER — Encounter (HOSPITAL_COMMUNITY)
Admission: RE | Admit: 2021-06-08 | Discharge: 2021-06-08 | Disposition: A | Discharge status: Home / Self Care | Payer: Medicaid Other | Source: Ambulatory Visit | Attending: Gynecologic Oncology | Admitting: Gynecologic Oncology

## 2021-06-08 VITALS — BP 128/73 | HR 96 | Temp 98.7°F | Resp 18 | Ht 65.0 in | Wt 193.3 lb

## 2021-06-08 DIAGNOSIS — N9489 Other specified conditions associated with female genital organs and menstrual cycle: Secondary | ICD-10-CM

## 2021-06-08 DIAGNOSIS — D6859 Other primary thrombophilia: Secondary | ICD-10-CM | POA: Insufficient documentation

## 2021-06-08 DIAGNOSIS — Z8673 Personal history of transient ischemic attack (TIA), and cerebral infarction without residual deficits: Secondary | ICD-10-CM | POA: Insufficient documentation

## 2021-06-08 DIAGNOSIS — N939 Abnormal uterine and vaginal bleeding, unspecified: Secondary | ICD-10-CM

## 2021-06-08 DIAGNOSIS — Z01812 Encounter for preprocedural laboratory examination: Secondary | ICD-10-CM | POA: Insufficient documentation

## 2021-06-08 DIAGNOSIS — R5383 Other fatigue: Secondary | ICD-10-CM | POA: Insufficient documentation

## 2021-06-08 DIAGNOSIS — F17210 Nicotine dependence, cigarettes, uncomplicated: Secondary | ICD-10-CM | POA: Insufficient documentation

## 2021-06-08 DIAGNOSIS — Z87891 Personal history of nicotine dependence: Secondary | ICD-10-CM | POA: Insufficient documentation

## 2021-06-08 HISTORY — DX: Nausea with vomiting, unspecified: R11.2

## 2021-06-08 HISTORY — DX: Personal history of other infectious and parasitic diseases: Z86.19

## 2021-06-08 HISTORY — DX: Pneumonia, unspecified organism: J18.9

## 2021-06-08 HISTORY — DX: Other specified postprocedural states: Z98.890

## 2021-06-08 HISTORY — DX: Depression, unspecified: F32.A

## 2021-06-08 HISTORY — DX: Anxiety disorder, unspecified: F41.9

## 2021-06-08 HISTORY — DX: Heart failure, unspecified: I50.9

## 2021-06-08 LAB — CBC
HCT: 34.8 % — ABNORMAL LOW (ref 36.0–46.0)
Hemoglobin: 11.5 g/dL — ABNORMAL LOW (ref 12.0–15.0)
MCH: 29.4 pg (ref 26.0–34.0)
MCHC: 33 g/dL (ref 30.0–36.0)
MCV: 89 fL (ref 80.0–100.0)
Platelets: 282 10*3/uL (ref 150–400)
RBC: 3.91 MIL/uL (ref 3.87–5.11)
RDW: 14.7 % (ref 11.5–15.5)
WBC: 9.8 10*3/uL (ref 4.0–10.5)
nRBC: 0 % (ref 0.0–0.2)

## 2021-06-08 LAB — BASIC METABOLIC PANEL
Anion gap: 8 (ref 5–15)
BUN: 11 mg/dL (ref 6–20)
CO2: 23 mmol/L (ref 22–32)
Calcium: 8.9 mg/dL (ref 8.9–10.3)
Chloride: 110 mmol/L (ref 98–111)
Creatinine, Ser: 0.9 mg/dL (ref 0.44–1.00)
GFR, Estimated: >60 mL/min (ref >60)
Glucose, Bld: 85 mg/dL (ref 70–99)
Potassium: 3.7 mmol/L (ref 3.5–5.1)
Sodium: 141 mmol/L (ref 135–145)

## 2021-06-08 NOTE — Progress Notes (Signed)
Anesthesia Chart Review   Case: [id number] Date/Time: 06/14/21 1015   Procedures:      DILATATION AND CURETTAGE     POSSIBLE MERINA INTRAUTERINE DEVICE (IUD) INSERTION   Anesthesia type: Choice   Pre-op diagnosis: ABNORMAL UTERINE BLEEDING   Location: [facility] ROOM 06 / [facility]   Surgeons: Carver Fila, MD       DISCUSSION:30 y.o. former smoker with h/o PONV, stroke, abnormal uterine bleeding scheduled for above procedure 06/14/2021 with Dr. Eugene Garnet.   Pt last seen by neurology 05/26/2021. Per OV note, "Patient is requesting clearance to proceed with surgical procedure for ovarian cyst tentatively scheduled on 06/14/2021.  She has been stable from stroke standpoint, she is cleared to proceed with surgical procedure from a stroke standpoint holding aspirin 3 to 5 days prior with small but acceptable preprocedure risk of recurrent stroke while off therapy.  Recommend restarting aspirin immediately after or once stable.  This was further discussed with patient and verbalized understanding."  Pt last seen by cardiology 01/11/2021. EF 25 to 30% during recent admission.  Likely secondary to septic shock.  Echocardiogram on 10/08/2020 showed LVEF had recovered to normal.  Advised to follow up with cardiology as needed.  VS: BP (!) 131/92   Pulse 77   Temp 37.1 C (Oral)   Resp 16   Ht 5' 5.5" (1.664 m)   Wt 86.2 kg   SpO2 100%   BMI 31.14 kg/m   PROVIDERS: Carmel Sacramento, NP is PCP   Delia Heady, MD is Neurologist  LABS: Labs reviewed: Acceptable for surgery. (all labs ordered are listed, but only abnormal results are displayed)  Labs Reviewed  CBC - Abnormal; Notable for the following components:      Result Value   Hemoglobin 11.5 (*)    HCT 34.8 (*)    All other components within normal limits  BASIC METABOLIC PANEL     IMAGES:   EKG: 09/15/2020 Rate 70 bpm  NSR Septal infarct, age undetermined   CV: Echo 10/08/2020 1. Left ventricular ejection fraction, by  estimation, is 55 to 60%. Left  ventricular ejection fraction by 3D volume is 57 %. The left ventricle has  normal function. The left ventricle has no regional wall motion  abnormalities. Left ventricular diastolic   parameters are indeterminate. The average left ventricular global  longitudinal strain is -20.5 %. The global longitudinal strain is normal.   2. Right ventricular systolic function is normal. The right ventricular  size is normal. Tricuspid regurgitation signal is inadequate for assessing  PA pressure.   3. The mitral valve is normal in structure. Trivial mitral valve  regurgitation. No evidence of mitral stenosis.   4. The aortic valve is normal in structure. Aortic valve regurgitation is  not visualized. No aortic stenosis is present.   5. The inferior vena cava is normal in size with greater than 50%  respiratory variability, suggesting right atrial pressure of 3 mmHg. Past Medical History:  Diagnosis Date   Adnexal mass    Anxiety    CHF (congestive heart failure) (HCC)    Depression    History of acute heart failure    Hx of septic shock    IBS (irritable bowel syndrome)    Pneumonia    PONV (postoperative nausea and vomiting)    Sinusitis    Stroke [facility])     Past Surgical History:  Procedure Laterality Date   BUBBLE STUDY  09/06/2020   Procedure: BUBBLE STUDY;  Surgeon: Tenny Craw,  Sherol Dade, MD;  Location: [facility] ENDOSCOPY;  Service: Cardiovascular;;   NO PAST SURGERIES     stitches to abdomen      for a cut    TEE WITHOUT CARDIOVERSION N/A 09/06/2020   Procedure: TRANSESOPHAGEAL ECHOCARDIOGRAM (TEE);  Surgeon: Pricilla Riffle, MD;  Location: [facility] ENDOSCOPY;  Service: Cardiovascular;  Laterality: N/A;    MEDICATIONS:  aspirin EC 81 MG tablet   DULoxetine (CYMBALTA) 60 MG capsule   Erenumab-aooe (AIMOVIG) 70 MG/ML SOAJ   linaclotide (LINZESS) 290 MCG CAPS capsule   prazosin (MINIPRESS) 1 MG capsule   QUEtiapine Fumarate (SEROQUEL XR) 150 MG 24 hr tablet   traMADol  (ULTRAM) 50 MG tablet   zolpidem (AMBIEN) 10 MG tablet   No current facility-administered medications for this encounter.    Jodell Cipro, PA-C [facility]-Surgical Testing [phone]

## 2021-06-08 NOTE — Progress Notes (Addendum)
Patient here for a pre-operative appointment prior to her scheduled surgery on June 14, 2021. She is scheduled for a dilation and curettage of the uterus with possible Mirena intrauterine device placement. The surgery was discussed in detail.  See after visit summary for additional details. Visual aids used to discuss items related to surgery including the female reproductive system and an IUD to discuss surgery in detail. She is not sure at this time if she will proceed with IUD placement at the time of surgery.   Discussed measures to take at home to prevent DVT including frequent mobility.  Post-operative instructions discussed. She will plan to be out of work on the day of her procedure and is not working on Wednesday. She is requesting a different pain medication since the tramadol offered little relief. Advised this would be discussed with Dr. Pricilla Holm and she would be contacted with her recommendations.     5 minutes spent with the patient.  Verbalizing understanding of material discussed. No needs or concerns voiced at the end of the visit.   Advised patient and family to call for any needs. The patient was escorted to the [facility] for her pre-op appt there.   This appointment is included in the global surgical bundle as pre-operative teaching and has no charge.     Update: Patient informed by [facility], RN of Dr. Winferd Humphrey recommendations for pain management to be deferred to her gynecologist.

## 2021-06-08 NOTE — Progress Notes (Addendum)
Anesthesia Review:  PCP: Carmel Sacramento, NP i with [facility]  Cardiologist : DR Edsel Petrin 01/11/2021  Neurology- LOV Ihor Austin, NP 05/26/21 DR Pearlean Brownie  Reqeusted clearance to be faxed by OB/GYN Clearance also in note dated 05/26/21.  Clearance on chartIhor Austin 05/26/21.  Chest x-ray : 09/02/20- [facility]  09/02/20- Ct angio chest  10/24/2020- Monitor  EKG : 09/15/2020  Echo : 08/08/20  Stress test: Cardiac Cath :  Activity level: can do a flight of stairs without difficulty  Sleep Study/ CPAP : none  Fasting Blood Sugar :      / Checks Blood Sugar -- times a day:   Blood Thinner/ Instructions /Last Dose: ASA / Instructions/ Last Dose :   Hx of stroke- 08/2020 pt states only deficit is she has problems with memory per pt  81 mg Aspirin

## 2021-06-08 NOTE — Patient Instructions (Signed)
Preparing for your Surgery  Plan for surgery on June 14, 2021 with Dr. Eugene Garnet at [facility]. You will be scheduled for a dilation and curettage of the uterus, possible Mirena intrauterine device (IUD) placement.   Pre-operative Testing -You will receive a phone call from presurgical testing at [facility] to discuss surgery instructions and arrange for lab work if needed.  -Bring your insurance card, copy of an advanced directive if applicable, medication list.  -You should not be taking blood thinners or aspirin at least ten days prior to surgery unless instructed by your surgeon.  -Do not take supplements such as fish oil (omega 3), red yeast rice, turmeric before your surgery. You want to avoid medications with aspirin in them including headache powders such as BC or Goody's), Excedrin migraine.  Day Before Surgery at Home -You will be advised you can have clear liquids up until 3 hours before your surgery.    Your role in recovery Your role is to become active as soon as directed by your doctor, while still giving yourself time to heal.  Rest when you feel tired. You will be asked to do the following in order to speed your recovery:  - Cough and breathe deeply. This helps to clear and expand your lungs and can prevent pneumonia after surgery.  - STAY ACTIVE WHEN YOU GET HOME. Do mild physical activity. Walking or moving your legs help your circulation and body functions return to normal. Do not try to get up or walk alone the first time after surgery.   -If you develop swelling on one leg or the other, pain in the back of your leg, redness/warmth in one of your legs, please call the office or go to the Emergency Room to have a doppler to rule out a blood clot. For shortness of breath, chest pain-seek care in the Emergency Room as soon as possible. - Actively manage your pain. Managing your pain lets you move in comfort. We will ask you to rate your pain on a  scale of zero to 10. It is your responsibility to tell your doctor or nurse where and how much you hurt so your pain can be treated.  Special Considerations -Your final pathology results from surgery should be available around one week after surgery and the results will be relayed to you when available.  -FMLA forms can be faxed to [phone] and please allow 5-7 business days for completion.  Pain Management After Surgery -Make sure that you have Tylenol and Ibuprofen at home to use on a regular basis after surgery for pain control. We recommend alternating the medications every hour to six hours since they work differently and are processed in the body differently for pain relief.  Bowel Regimen - It is important to prevent constipation and drink adequate amounts of liquids.   Risks of Surgery Risks of surgery are low but include bleeding, infection, damage to surrounding structures, re-operation, blood clots, and very rarely death.  AFTER SURGERY INSTRUCTIONS  Return to work:  1-2 days if applicable  Activity: 1. Be up and out of the bed during the day.  Take a nap if needed.  You may walk up steps but be careful and use the hand rail.  Stair climbing will tire you more than you think, you may need to stop part way and rest.   2. No lifting or straining for 1 week over 10 pounds. No pushing, pulling, straining for 1 week.  3.  No driving for minimum 24 hours after surgery.  Do not drive if you are taking narcotic pain medicine and make sure that your reaction time has returned.   4. You can shower as soon as the next day after surgery. Shower daily. No tub baths or submerging your body in water until cleared by your surgeon. If you have the soap that was given to you by pre-surgical testing that was used before surgery, you do not need to use it afterwards because this can irritate your incisions.   5. No sexual activity and nothing in the vagina for 4 weeks.  6. You may experience  vaginal spotting and discharge after surgery.  The spotting is normal but if you experience heavy bleeding, call our office.  7. Take Tylenol or ibuprofen for pain. Monitor your Tylenol intake to a max of 4,000 mg in a 24 hour period. You can alternate these medications after surgery.  Diet: 1. Low sodium Heart Healthy Diet is recommended but you are cleared to resume your normal (before surgery) diet after your procedure.  2. It is safe to use a laxative, such as Miralax or Colace, if you have difficulty moving your bowels.    Wound Care: 1. Keep clean and dry.  Shower daily.  Reasons to call the Doctor: Fever - Oral temperature greater than 100.4 degrees Fahrenheit Foul-smelling vaginal discharge Difficulty urinating Nausea and vomiting Increased pain at the site of the incision that is unrelieved with pain medicine. Difficulty breathing with or without chest pain New calf pain especially if only on one side Sudden, continuing increased vaginal bleeding with or without clots.   Contacts: For questions or concerns you should contact:  Dr. Eugene Garnet at [phone]  Warner Mccreedy, NP at [phone]  After Hours: call [phone] and have the GYN Oncologist paged/contacted (after 5 pm or on the weekends).  Messages sent via mychart are for non-urgent matters and are not responded to after hours so for urgent needs, please call the after hours number.

## 2021-06-08 NOTE — Anesthesia Preprocedure Evaluation (Addendum)
Anesthesia Evaluation  Patient identified by MRN, date of birth, ID band Patient awake    Reviewed: Allergy & Precautions, NPO status , Patient's Chart, lab work & pertinent test results, reviewed documented beta blocker date and time   History of Anesthesia Complications (+) PONV and history of anesthetic complications  Airway Mallampati: II  TM Distance: >3 FB Neck ROM: Full    Dental  (+) Poor Dentition, Chipped, Dental Advisory Given,    Pulmonary pneumonia, resolved, Current Smoker and Patient abstained from smoking., former smoker,    Pulmonary exam normal breath sounds clear to auscultation       Cardiovascular +CHF  Normal cardiovascular exam Rhythm:Regular Rate:Normal     Neuro/Psych  Headaches, PSYCHIATRIC DISORDERS Anxiety Depression Poor memory CVA, Residual Symptoms    GI/Hepatic negative GI ROS, Neg liver ROS,   Endo/Other  Obesity  Renal/GU negative Renal ROS  negative genitourinary   Musculoskeletal negative musculoskeletal ROS (+)   Abdominal (+) + obese,   Peds  Hematology  (+) anemia ,   Anesthesia Other Findings   Reproductive/Obstetrics AUB  Undesired fertility                            Anesthesia Physical Anesthesia Plan  ASA: 2  Anesthesia Plan: General   Post-op Pain Management:    Induction: Intravenous  PONV Risk Score and Plan: 4 or greater and Treatment may vary due to age or medical condition  Airway Management Planned: Natural Airway  Additional Equipment:   Intra-op Plan:   Post-operative Plan: Extubation in OR  Informed Consent: I have reviewed the patients History and Physical, chart, labs and discussed the procedure including the risks, benefits and alternatives for the proposed anesthesia with the patient or authorized representative who has indicated his/her understanding and acceptance.     Dental advisory given  Plan Discussed  with: CRNA and Anesthesiologist  Anesthesia Plan Comments: (See PAT note 06/08/2021, Jodell Cipro, PA-C)       Anesthesia Quick Evaluation

## 2021-06-13 ENCOUNTER — Telehealth: Payer: Self-pay | Admitting: *Deleted

## 2021-06-13 MED ORDER — LEVONORGESTREL 20 MCG/DAY IU IUD
1.0000 | INTRAUTERINE_SYSTEM | INTRAUTERINE | Status: AC
  Administered 2021-06-14: 1 via INTRAUTERINE
  Filled 2021-06-13: qty 1

## 2021-06-14 ENCOUNTER — Ambulatory Visit (HOSPITAL_COMMUNITY): Admission: RE | Admit: 2021-06-14 | Payer: Medicaid Other | Source: Home / Self Care | Admitting: Gynecologic Oncology

## 2021-06-14 ENCOUNTER — Encounter (HOSPITAL_COMMUNITY): Payer: Self-pay | Admitting: Gynecologic Oncology

## 2021-06-14 ENCOUNTER — Encounter (HOSPITAL_COMMUNITY)
Admission: RE | Disposition: A | Discharge status: Home / Self Care | Payer: Self-pay | Source: Home / Self Care | Attending: Gynecologic Oncology

## 2021-06-14 ENCOUNTER — Ambulatory Visit (HOSPITAL_COMMUNITY): Payer: Medicaid Other | Admitting: Physician Assistant

## 2021-06-14 ENCOUNTER — Ambulatory Visit (HOSPITAL_COMMUNITY): Payer: Medicaid Other | Admitting: Certified Registered"

## 2021-06-14 ENCOUNTER — Ambulatory Visit (HOSPITAL_COMMUNITY)
Admission: RE | Admit: 2021-06-14 | Discharge: 2021-06-14 | Disposition: A | Discharge status: Home / Self Care | Payer: Medicaid Other | Attending: Gynecologic Oncology | Admitting: Gynecologic Oncology

## 2021-06-14 ENCOUNTER — Encounter (HOSPITAL_COMMUNITY): Admission: RE | Payer: Self-pay | Source: Home / Self Care

## 2021-06-14 DIAGNOSIS — N9489 Other specified conditions associated with female genital organs and menstrual cycle: Secondary | ICD-10-CM

## 2021-06-14 DIAGNOSIS — Z8049 Family history of malignant neoplasm of other genital organs: Secondary | ICD-10-CM | POA: Insufficient documentation

## 2021-06-14 DIAGNOSIS — Z880 Allergy status to penicillin: Secondary | ICD-10-CM | POA: Insufficient documentation

## 2021-06-14 DIAGNOSIS — Z30430 Encounter for insertion of intrauterine contraceptive device: Secondary | ICD-10-CM | POA: Insufficient documentation

## 2021-06-14 DIAGNOSIS — Z87892 Personal history of anaphylaxis: Secondary | ICD-10-CM | POA: Insufficient documentation

## 2021-06-14 DIAGNOSIS — Z791 Long term (current) use of non-steroidal anti-inflammatories (NSAID): Secondary | ICD-10-CM | POA: Diagnosis not present

## 2021-06-14 DIAGNOSIS — N939 Abnormal uterine and vaginal bleeding, unspecified: Secondary | ICD-10-CM | POA: Diagnosis not present

## 2021-06-14 DIAGNOSIS — Z79899 Other long term (current) drug therapy: Secondary | ICD-10-CM | POA: Diagnosis not present

## 2021-06-14 HISTORY — PX: INTRAUTERINE DEVICE (IUD) INSERTION: SHX5877

## 2021-06-14 HISTORY — PX: DILATION AND CURETTAGE OF UTERUS: SHX78

## 2021-06-14 HISTORY — DX: Other specified conditions associated with female genital organs and menstrual cycle: N94.89

## 2021-06-14 LAB — PREGNANCY, URINE: Preg Test, Ur: NEGATIVE

## 2021-06-14 SURGERY — DILATION AND CURETTAGE
Anesthesia: General

## 2021-06-14 SURGERY — EXCISION, CYST, OVARY, ROBOT-ASSISTED, LAPAROSCOPIC
Anesthesia: General

## 2021-06-14 MED ORDER — SCOPOLAMINE 1 MG/3DAYS TD PT72
MEDICATED_PATCH | TRANSDERMAL | Status: AC
  Filled 2021-06-14: qty 1

## 2021-06-14 MED ORDER — MIDAZOLAM HCL 2 MG/2ML IJ SOLN
INTRAMUSCULAR | Status: DC | PRN
  Administered 2021-06-14: 2 mg via INTRAVENOUS

## 2021-06-14 MED ORDER — OXYCODONE HCL 5 MG/5ML PO SOLN: 5.0000 mg | Freq: Once | ORAL | Status: DC | PRN

## 2021-06-14 MED ORDER — 0.9 % SODIUM CHLORIDE (POUR BTL) OPTIME
TOPICAL | Status: DC | PRN
Start: 2021-06-14 — End: 2021-06-14
  Administered 2021-06-14: 1000 mL

## 2021-06-14 MED ORDER — FENTANYL CITRATE (PF) 100 MCG/2ML IJ SOLN
25.0000 ug | INTRAMUSCULAR | Status: DC | PRN
  Administered 2021-06-14: 50 ug via INTRAVENOUS

## 2021-06-14 MED ORDER — LIDOCAINE 2% (20 MG/ML) 5 ML SYRINGE
INTRAMUSCULAR | Status: DC | PRN
  Administered 2021-06-14: 80 mg via INTRAVENOUS

## 2021-06-14 MED ORDER — DEXAMETHASONE SODIUM PHOSPHATE 10 MG/ML IJ SOLN
INTRAMUSCULAR | Status: DC | PRN
  Administered 2021-06-14: 10 mg via INTRAVENOUS

## 2021-06-14 MED ORDER — LACTATED RINGERS IV SOLN: INTRAVENOUS | Status: DC

## 2021-06-14 MED ORDER — ONDANSETRON HCL 4 MG/2ML IJ SOLN
INTRAMUSCULAR | Status: DC | PRN
  Administered 2021-06-14: 4 mg via INTRAVENOUS

## 2021-06-14 MED ORDER — ONDANSETRON HCL 4 MG/2ML IJ SOLN
4.0000 mg | Freq: Once | INTRAMUSCULAR | Status: AC | PRN
  Administered 2021-06-14: 4 mg via INTRAVENOUS

## 2021-06-14 MED ORDER — LIDOCAINE HCL 1 % IJ SOLN
INTRAMUSCULAR | Status: DC | PRN
  Administered 2021-06-14: 10 mL

## 2021-06-14 MED ORDER — SILVER NITRATE-POT NITRATE 75-25 % EX MISC
CUTANEOUS | Status: DC | PRN
  Administered 2021-06-14: 1

## 2021-06-14 MED ORDER — SILVER NITRATE-POT NITRATE 75-25 % EX MISC
CUTANEOUS | Status: AC
  Filled 2021-06-14: qty 10

## 2021-06-14 MED ORDER — ORAL CARE MOUTH RINSE: 15.0000 mL | Freq: Once | OROMUCOSAL | Status: AC

## 2021-06-14 MED ORDER — CHLORHEXIDINE GLUCONATE 0.12 % MT SOLN
15.0000 mL | Freq: Once | OROMUCOSAL | Status: AC
  Administered 2021-06-14: 15 mL via OROMUCOSAL

## 2021-06-14 MED ORDER — PROPOFOL 10 MG/ML IV BOLUS
INTRAVENOUS | Status: DC | PRN
  Administered 2021-06-14: 50 mg via INTRAVENOUS
  Administered 2021-06-14: 180 mg via INTRAVENOUS
  Administered 2021-06-14: 20 mg via INTRAVENOUS

## 2021-06-14 MED ORDER — SCOPOLAMINE 1 MG/3DAYS TD PT72
1.0000 | MEDICATED_PATCH | TRANSDERMAL | Status: DC
  Administered 2021-06-14: 1.5 mg via TRANSDERMAL

## 2021-06-14 MED ORDER — FENTANYL CITRATE (PF) 250 MCG/5ML IJ SOLN
INTRAMUSCULAR | Status: DC | PRN
  Administered 2021-06-14: 100 ug via INTRAVENOUS
  Administered 2021-06-14 (×2): 50 ug via INTRAVENOUS

## 2021-06-14 MED ORDER — ONDANSETRON HCL 4 MG/2ML IJ SOLN
INTRAMUSCULAR | Status: AC
  Filled 2021-06-14: qty 2

## 2021-06-14 MED ORDER — OXYCODONE HCL 5 MG PO TABS: 5.0000 mg | ORAL_TABLET | Freq: Once | ORAL | Status: DC | PRN

## 2021-06-14 MED ORDER — FENTANYL CITRATE (PF) 100 MCG/2ML IJ SOLN
INTRAMUSCULAR | Status: AC
  Filled 2021-06-14: qty 2

## 2021-06-14 MED ORDER — LIDOCAINE HCL (PF) 1 % IJ SOLN
INTRAMUSCULAR | Status: AC
  Filled 2021-06-14: qty 30

## 2021-06-14 SURGICAL SUPPLY — 25 items
BAG COUNTER SPONGE SURGICOUNT (BAG) IMPLANT
BAG SPNG CNTER NS LX DISP (BAG)
CATH ROBINSON RED A/P 16FR (CATHETERS) ×2 IMPLANT
DRAPE SHEET LG 3/4 BI-LAMINATE (DRAPES) ×2 IMPLANT
DRAPE UNDERBUTTOCKS STRL (DISPOSABLE) ×2 IMPLANT
DRSG TELFA 3X8 NADH (GAUZE/BANDAGES/DRESSINGS) ×2 IMPLANT
GAUZE 4X4 16PLY ~~LOC~~+RFID DBL (SPONGE) ×2 IMPLANT
GLOVE SURG ENC MOIS LTX SZ6 (GLOVE) ×4 IMPLANT
GOWN STRL REUS W/ TWL LRG LVL3 (GOWN DISPOSABLE) ×2 IMPLANT
GOWN STRL REUS W/TWL LRG LVL3 (GOWN DISPOSABLE) ×4
KIT BASIN OR (CUSTOM PROCEDURE TRAY) ×2 IMPLANT
KIT TURNOVER KIT A (KITS) ×2 IMPLANT
NEEDLE SPNL 22GX3.5 QUINCKE BK (NEEDLE) ×2 IMPLANT
NS IRRIG 1000ML POUR BTL (IV SOLUTION) ×2 IMPLANT
PACK LITHOTOMY IV (CUSTOM PROCEDURE TRAY) ×2 IMPLANT
PAD OB MATERNITY 4.3X12.25 (PERSONAL CARE ITEMS) ×2 IMPLANT
PENCIL SMOKE EVACUATOR (MISCELLANEOUS) IMPLANT
SCRUB EXIDINE 4% CHG 4OZ (MISCELLANEOUS) ×2 IMPLANT
SURGILUBE 2OZ TUBE FLIPTOP (MISCELLANEOUS) ×2 IMPLANT
SUT VIC AB 3-0 SH 27 (SUTURE) ×2
SUT VIC AB 3-0 SH 27X BRD (SUTURE) ×1 IMPLANT
SYR BULB IRRIG 60ML STRL (SYRINGE) ×2 IMPLANT
TOWEL OR 17X26 10 PK STRL BLUE (TOWEL DISPOSABLE) ×2 IMPLANT
TOWEL OR NON WOVEN STRL DISP B (DISPOSABLE) ×2 IMPLANT
UNDERPAD 30X36 HEAVY ABSORB (UNDERPADS AND DIAPERS) ×4 IMPLANT

## 2021-06-14 NOTE — Anesthesia Postprocedure Evaluation (Signed)
Anesthesia Post Note  Patient: Tabitha Smith  Procedure(s) Performed: DILATATION AND CURETTAGE MERINA INTRAUTERINE DEVICE (IUD) INSERTION     Patient location during evaluation: PACU Anesthesia Type: General Level of consciousness: awake and alert and oriented Pain management: pain level controlled Vital Signs Assessment: post-procedure vital signs reviewed and stable Respiratory status: spontaneous breathing, nonlabored ventilation and respiratory function stable Cardiovascular status: blood pressure returned to baseline and stable Postop Assessment: no apparent nausea or vomiting Anesthetic complications: no   No notable events documented.  Last Vitals:  Vitals:   06/14/21 1156 06/14/21 1211  BP:  115/88  Pulse: 66 69  Resp: 13 16  Temp: 36.7 C   SpO2: 100% 100%    Last Pain:  Vitals:   06/14/21 1145  TempSrc:   PainSc: Asleep                 Ashtian Villacis A.

## 2021-06-14 NOTE — Interval H&P Note (Signed)
History and Physical Interval Note: Previous cyst resolved, plan for endometrial sampling and Mirena IUD insertion today.  06/14/2021 9:35 AM  Tabitha Smith  has presented today for surgery, with the diagnosis of ABNORMAL UTERINE BLEEDING.  The various methods of treatment have been discussed with the patient and family. After consideration of risks, benefits and other options for treatment, the patient has consented to  Procedure(s): DILATATION AND CURETTAGE (N/A) POSSIBLE MERINA INTRAUTERINE DEVICE (IUD) INSERTION (N/A) as a surgical intervention.  The patient's history has been reviewed, patient examined, no change in status, stable for surgery.  I have reviewed the patient's chart and labs.  Questions were answered to the patient's satisfaction.     Carver Fila

## 2021-06-14 NOTE — Anesthesia Procedure Notes (Signed)
Procedure Name: LMA Insertion Date/Time: 06/14/2021 10:14 AM Performed by: Minerva Ends, CRNA Pre-anesthesia Checklist: Patient identified, Emergency Drugs available, Suction available and Patient being monitored Patient Re-evaluated:Patient Re-evaluated prior to induction Oxygen Delivery Method: Circle System Utilized Preoxygenation: Pre-oxygenation with 100% oxygen Induction Type: IV induction Ventilation: Mask ventilation without difficulty LMA: LMA inserted and LMA with gastric port inserted LMA Size: 4.0 Number of attempts: 1 Placement Confirmation: positive ETCO2 Tube secured with: Tape Dental Injury: Teeth and Oropharynx as per pre-operative assessment  Comments: Smooth IV induction Foster- LMA insertion AM CRNA atraumatic-- teeth and mouth as preop- front teeth with slight chipping

## 2021-06-14 NOTE — Op Note (Signed)
PATIENT: Tabitha Smith DATE: 06/14/21  Preop Diagnosis: abnormal uterine bleeding  Postoperative Diagnosis: same  Surgery: D&C (dilation and curettage), Mirena IUD insertion  Surgeons:  Carin Hock MD  Assistant: none  Anesthesia: LMA  Estimated blood loss: <15 ml  IVF:  see I&O flowsheet   Urine output: n/a   Complications: None apparent  Pathology: endometrial curetteings  Operative findings: 8-10cm mobile uterus, no palpable adnexal masses. Uterus sounded to just under 9cm. Normal appearing cervix, no vaginal masses or lesions. Minimal endometrial tissue on curettage, uterine cri noted. Mirena Lot #[id number] , exp 07/2023.  Procedure: The patient was identified in the preoperative holding area. Informed consent was signed on the chart. Patient was seen history was reviewed and exam was performed.   The patient was then taken to the operating room and placed in the supine position with SCD hose on. General anesthesia was then induced without difficulty. She was then placed in the dorsolithotomy position. The perineum was prepped with Betadine. The vagina was prepped with Betadine. The patient was then draped after the prep was dried.   Timeout was performed the patient, procedure, antibiotic, allergy, and length of procedure.   The speculum was placed in the posterior vagina. The single tooth tenaculum was placed on the anterior lip of the cervix. The uterine sound was placed in the cervix and advanced to the fundus. The cervix was successively dilated using pratts dilators to 90F. A sharp curette was advanced to the fundus and a comprehensive curette of the endometrial cavity took place until a gritty feel was appreciated.  The specimen was collected on a telfa and sent for permanent pathology.  Mirena IUD was inserted into the uterus, deployed, and inserted to the fundus. The inserter was removed. Strings were cut at 3-4cm from the cervical os.   The tenaculum  was removed. Due to bleeding on the anterior lip of the cervix, not responsive to pressure or silver nitrate, a figure of eight stitch with 3-0 Vicryl was placed to achieve hemostasis.   The vagina was irrigated.  All instrument, suture, laparotomy, Ray-Tec, and needle counts were correct x2. The patient tolerated the procedure well and was taken recovery room in stable condition.   Eugene Garnet MD Gynecologic Oncology

## 2021-06-14 NOTE — Transfer of Care (Signed)
Immediate Anesthesia Transfer of Care Note  Patient: Tabitha Smith  Procedure(s) Performed: DILATATION AND CURETTAGE MERINA INTRAUTERINE DEVICE (IUD) INSERTION  Patient Location: PACU  Anesthesia Type:General  Level of Consciousness: sedated  Airway & Oxygen Therapy: Patient Spontanous Breathing and Patient connected to face mask oxygen  Post-op Assessment: Report given to RN and Post -op Vital signs reviewed and stable  Post vital signs: Reviewed and stable  Last Vitals:  Vitals Value Taken Time  BP 95/52 06/14/21 1048  Temp 36.9 C 06/14/21 1048  Pulse 77 06/14/21 1051  Resp 13 06/14/21 1051  SpO2 100 % 06/14/21 1051  Vitals shown include unvalidated device data.  Last Pain:  Vitals:   06/14/21 1048  TempSrc:   PainSc: Asleep      Patients Stated Pain Goal: 4 (06/14/21 0800)  Complications: No notable events documented.

## 2021-06-14 NOTE — Anesthesia Procedure Notes (Signed)
Date/Time: 06/14/2021 10:39 AM Performed by: Minerva Ends, CRNA Oxygen Delivery Method: Simple face mask Placement Confirmation: positive ETCO2 and breath sounds checked- equal and bilateral Dental Injury: Teeth and Oropharynx as per pre-operative assessment

## 2021-06-14 NOTE — Discharge Instructions (Signed)
06/14/2021  Return to work: 1-2 days, depending on how you feel after anesthesia.  Activity: 1. Be up and out of the bed during the day.  Take a nap if needed.  You may walk up steps but be careful and use the hand rail.    2. No driving until you can brake safely. For most people, this is 1-2 days after this type of surgery.  Do Not drive if you are taking narcotic pain medicine.  3. Shower normally; no baths for 2-3 weeks.   4. Nothing in the vagina for 2-3 weeks.  Medications:  - Take ibuprofen and tylenol first line for pain control if needed. You received local anesthesia during surgery which should hopefully limit pain today.  Reasons to call the Doctor:  Fever - Oral temperature greater than 100.4 degrees Fahrenheit Foul-smelling vaginal discharge Difficulty urinating Nausea and vomiting Difficulty breathing with or without chest pain New calf pain especially if only on one side Sudden, continuing increased vaginal bleeding with or without clots.   Follow-up: 1. See Eugene Garnet in 3-4 weeks as scheduled.  Contacts: For questions or concerns you should contact:  Dr. Eugene Garnet at [phone] After hours and on week-ends call [phone] and ask to speak to the physician on call for Gynecologic Oncology

## 2021-06-15 ENCOUNTER — Ambulatory Visit: Payer: Medicaid Other | Admitting: Neurology

## 2021-06-15 ENCOUNTER — Telehealth: Payer: Self-pay | Admitting: *Deleted

## 2021-06-15 ENCOUNTER — Encounter (HOSPITAL_COMMUNITY): Payer: Self-pay | Admitting: Gynecologic Oncology

## 2021-06-15 ENCOUNTER — Ambulatory Visit: Payer: Medicaid Other | Admitting: Adult Health

## 2021-06-15 NOTE — Telephone Encounter (Signed)
Post op call to patient, (D&C with mirena placement on 06/14/21).  Patient states she is having intermittent lower abdominal pain, took tylenol 2 hours prior to this call, is asking if she can alternate ibuprofen with the tylenol.  Informed patient this is fine.  Patient states she has eaten very little but is drinking adequate fluids.  Encouraged patient to drink 64 oz of water daily.  Patient also states she is urinating & passing gas, she is bleeding lightly, denies heavy bleeding.  Patient states she alternates between having hot flashes & periods of being cold, instructed patient to check her temp during these episodes & notify our office if her temp is >100.4, she verbalizes understanding.  Patient aware of post op appointment & office phone number.

## 2021-06-16 ENCOUNTER — Encounter: Payer: Self-pay | Admitting: Physician Assistant

## 2021-06-16 ENCOUNTER — Inpatient Hospital Stay: Payer: Medicaid Other

## 2021-06-16 ENCOUNTER — Inpatient Hospital Stay (HOSPITAL_BASED_OUTPATIENT_CLINIC_OR_DEPARTMENT_OTHER): Payer: Medicaid Other | Admitting: Physician Assistant

## 2021-06-16 ENCOUNTER — Other Ambulatory Visit: Payer: Self-pay

## 2021-06-16 ENCOUNTER — Telehealth: Payer: Self-pay

## 2021-06-16 VITALS — BP 131/82 | HR 84 | Temp 98.2°F | Resp 18 | Ht 65.5 in

## 2021-06-16 DIAGNOSIS — Z8673 Personal history of transient ischemic attack (TIA), and cerebral infarction without residual deficits: Secondary | ICD-10-CM | POA: Insufficient documentation

## 2021-06-16 DIAGNOSIS — F17210 Nicotine dependence, cigarettes, uncomplicated: Secondary | ICD-10-CM

## 2021-06-16 DIAGNOSIS — R5383 Other fatigue: Secondary | ICD-10-CM

## 2021-06-16 DIAGNOSIS — D6859 Other primary thrombophilia: Secondary | ICD-10-CM | POA: Insufficient documentation

## 2021-06-16 LAB — CBC WITH DIFFERENTIAL (CANCER CENTER ONLY)
Abs Immature Granulocytes: 0.02 10*3/uL (ref 0.00–0.07)
Basophils Absolute: 0 10*3/uL (ref 0.0–0.1)
Basophils Relative: 0 %
Eosinophils Absolute: 0.1 10*3/uL (ref 0.0–0.5)
Eosinophils Relative: 1 %
HCT: 35.1 % — ABNORMAL LOW (ref 36.0–46.0)
Hemoglobin: 11.8 g/dL — ABNORMAL LOW (ref 12.0–15.0)
Immature Granulocytes: 0 %
Lymphocytes Relative: 43 %
Lymphs Abs: 3.9 10*3/uL (ref 0.7–4.0)
MCH: 29.5 pg (ref 26.0–34.0)
MCHC: 33.6 g/dL (ref 30.0–36.0)
MCV: 87.8 fL (ref 80.0–100.0)
Monocytes Absolute: 0.7 10*3/uL (ref 0.1–1.0)
Monocytes Relative: 7 %
Neutro Abs: 4.3 10*3/uL (ref 1.7–7.7)
Neutrophils Relative %: 49 %
Platelet Count: 298 10*3/uL (ref 150–400)
RBC: 4 MIL/uL (ref 3.87–5.11)
RDW: 14.4 % (ref 11.5–15.5)
WBC Count: 9 10*3/uL (ref 4.0–10.5)
nRBC: 0 % (ref 0.0–0.2)

## 2021-06-16 LAB — SURGICAL PATHOLOGY

## 2021-06-16 LAB — RETIC PANEL
Immature Retic Fract: 13 % (ref 2.3–15.9)
RBC.: 4.04 MIL/uL (ref 3.87–5.11)
Retic Count, Absolute: 52.9 10*3/uL (ref 19.0–186.0)
Retic Ct Pct: 1.3 % (ref 0.4–3.1)
Reticulocyte Hemoglobin: 32.7 pg (ref >27.9)

## 2021-06-16 NOTE — Telephone Encounter (Signed)
Spoke with Tabitha Smith. Dr. Pricilla Holm was not in clinic on wednesdays or Thursdays close to the 07-15-21 visit. Pt is off Wednesdays and Thursdays. She works 1pm-10 pm.  Pt decided to keep appointment scheduled on 07-15-21 ar 2 pm.

## 2021-06-16 NOTE — Progress Notes (Signed)
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 MRI brain revealed multifocal L occipitotemporal and L cerebellar infarcts.She was discharged on Aspirin and Plavix. Hypercoagulable workup was positive for mild elevation of anticardiolipin Igm at 19 and decreased protein S activity at 37%.  2) 05/26/2021: Repeat cardiolipin antibody panel revealed mild increase in anticardiolipin IgM at 28.  3) 06/16/2021: Establish care with Georga Kaufmann PA-C  CHIEF COMPLAINTS/PURPOSE OF CONSULTATION:  "Elevated anticardiolipin level "  HISTORY OF PRESENTING ILLNESS:  Tabitha Smith 30 y.o. female with medical history significant for IBS, anxiety, iron deficiency anemia, CVA and adnexal mass. Patient is unaccompanied for this visit.  On exam today, Ms. Marut reports chronic fatigue that has been present for several years. She notes needing caffeine to get through the day. Her appetite and weight oscillate without any recent weight loss. She has nausea without any vomiting episodes. She was recently prescribed scopolamine patch with improvement of nausea. She has abdominal discomfort but attributes this to undergoing D&C on 06/14/2021. She notes having irregular and heavy menstrual bleeding. She has history of iron deficiency anemia and was on oral supplementation but not currently. She has chronic headaches/migraines. Patient denies any fevers, chills, night sweats, shortness of breath, chest pain, cough, dizziness or syncope. She notes difficulty with memory loss since her CVA but no other residual weakness or deficits. She has no other complaints. Rest of the 10 point ROS is below.   MEDICAL HISTORY:  Past Medical History:  Diagnosis  Date   Adnexal mass    Anxiety    CHF (congestive heart failure) (HCC)    Depression    History of acute heart failure    Hx of septic shock    IBS (irritable bowel syndrome)    Pneumonia    PONV (postoperative nausea and vomiting)    Sinusitis    Stroke [facility])     SURGICAL HISTORY: Past Surgical History:  Procedure Laterality Date   BUBBLE STUDY  09/06/2020   Procedure: BUBBLE STUDY;  Surgeon: Pricilla Riffle, MD;  Location: [facility] ENDOSCOPY;  Service: Cardiovascular;;   DILATION AND CURETTAGE OF UTERUS N/A 06/14/2021   Procedure: DILATATION AND CURETTAGE;  Surgeon: Carver Fila, MD;  Location: [facility] ORS;  Service: Gynecology;  Laterality: N/A;   INTRAUTERINE DEVICE (IUD) INSERTION N/A 06/14/2021   Procedure: MERINA INTRAUTERINE DEVICE (IUD) INSERTION;  Surgeon: Carver Fila, MD;  Location: [facility];  Service: Gynecology;  Laterality: N/A;   NO PAST SURGERIES     stitches to abdomen      for a cut    TEE WITHOUT CARDIOVERSION N/A 09/06/2020   Procedure: TRANSESOPHAGEAL ECHOCARDIOGRAM (TEE);  Surgeon: Pricilla Riffle, MD;  Location: [facility] ENDOSCOPY;  Service: Cardiovascular;  Laterality: N/A;    SOCIAL HISTORY: Social History   Socioeconomic History   Marital status: Single    Spouse name: Not on file   Number of children: 1   Years of education: Not on file   Highest education level: High school graduate  Occupational History   Not on file  Tobacco Use   Smoking status: Every Day    Packs/day: 0.50    Types: Cigarettes   Smokeless tobacco: Never  Vaping Use   Vaping Use: Never used  Substance and Sexual Activity  Alcohol use: Yes    Comment: occasionally   Drug use: Never   Sexual activity: Yes    Birth control/protection: None  Other Topics Concern   Not on file  Social History Narrative   Lives at home with family   Left handed   Caffeine: coffee, 2-4 cups/day   Social Determinants of Health   Financial Resource Strain: Not on file  Food Insecurity: Not on  file  Transportation Needs: Not on file  Physical Activity: Not on file  Stress: Not on file  Social Connections: Not on file  Intimate Partner Violence: Not on file    FAMILY HISTORY: Family History  Problem Relation Age of Onset   Diabetes Mother    Seizures Mother    Cancer Mother        Colon   Hypertension Mother    Stroke Mother    Migraines Neg Hx    Headache Neg Hx    Breast cancer Neg Hx    Ovarian cancer Neg Hx    Endometrial cancer Neg Hx    Pancreatic cancer Neg Hx    Prostate cancer Neg Hx     ALLERGIES:  is allergic to amoxicillin and penicillins.  MEDICATIONS:  Current Outpatient Medications  Medication Sig Dispense Refill   aspirin EC 81 MG tablet Take 81 mg by mouth daily. Swallow whole.     DULoxetine (CYMBALTA) 60 MG capsule Take 120 mg by mouth daily.     Erenumab-aooe (AIMOVIG) 70 MG/ML SOAJ Inject 70 mg into the skin every 14 (fourteen) days. 1.12 mL 6   linaclotide (LINZESS) 290 MCG CAPS capsule Take 290 mcg by mouth daily as needed (constipation).     prazosin (MINIPRESS) 1 MG capsule Take 1 mg by mouth 2 (two) times daily. Pt works 3rd shift so takes med at [employer] and at bedtime.     QUEtiapine Fumarate (SEROQUEL XR) 150 MG 24 hr tablet Take 150 mg by mouth at bedtime.     zolpidem (AMBIEN) 10 MG tablet Take 10 mg by mouth at bedtime as needed for sleep.     No current facility-administered medications for this visit.    REVIEW OF SYSTEMS:   Constitutional: ( - ) fevers, ( - )  chills , ( - ) night sweats Eyes: ( - ) blurriness of vision, ( - ) double vision, ( - ) watery eyes Ears, nose, mouth, throat, and face: ( - ) mucositis, ( - ) sore throat Respiratory: ( - ) cough, ( - ) dyspnea, ( - ) wheezes Cardiovascular: ( - ) palpitation, ( - ) chest discomfort, ( - ) lower extremity swelling Gastrointestinal:  ( - ) nausea, ( - ) heartburn, ( - ) change in bowel habits Skin: ( - ) abnormal skin rashes Lymphatics: ( - ) new lymphadenopathy, ( - )  easy bruising Neurological: ( - ) numbness, ( - ) tingling, ( - ) new weaknesses Behavioral/Psych: ( - ) mood change, ( - ) new changes  All other systems were reviewed with the patient and are negative.  PHYSICAL EXAMINATION: ECOG PERFORMANCE STATUS: 1 - Symptomatic but completely ambulatory  Vitals:   06/16/21 1406  BP: 131/82  Pulse: 84  Resp: 18  Temp: 98.2 F (36.8 C)  SpO2: 100%   There were no vitals filed for this visit.  GENERAL: well appearing African American female in NAD  SKIN: skin color, texture, turgor are normal, no rashes or significant lesions EYES: conjunctiva are pink and  non-injected, sclera clear OROPHARYNX: no exudate, no erythema; lips, buccal mucosa, and tongue normal  NECK: supple, non-tender LYMPH:  no palpable lymphadenopathy in the cervical or supraclavicular lymph nodes.  LUNGS: clear to auscultation and percussion with normal breathing effort HEART: regular rate & rhythm and no murmurs and no lower extremity edema ABDOMEN: soft, non-tender, non-distended, normal bowel sounds Musculoskeletal: no cyanosis of digits and no clubbing  PSYCH: alert & oriented x 3, fluent speech NEURO: no focal motor/sensory deficits  LABORATORY DATA:  I have reviewed the data as listed CBC Latest Ref Rng & Units 06/16/2021 06/08/2021 09/07/2020  WBC 4.0 - 10.5 K/uL 9.0 9.8 9.4  Hemoglobin 12.0 - 15.0 g/dL 11.8(L) 11.5(L) 12.3  Hematocrit 36.0 - 46.0 % 35.1(L) 34.8(L) 37.1  Platelets 150 - 400 K/uL 298 282 290    CMP Latest Ref Rng & Units 06/08/2021 09/07/2020 09/06/2020  Glucose 70 - 99 mg/dL 85 85 90  BUN 6 - 20 mg/dL 11 6 7   Creatinine 0.44 - 1.00 mg/dL 9.51 8.84  Sodium 135 - 145 mmol/L 141 136 137  Potassium 3.5 - 5.1 mmol/L 3.7 3.4(L) 3.5  Chloride 98 - 111 mmol/L 110 104 106  CO2 22 - 32 mmol/L 23 21(L) 23  Calcium 8.9 - 10.3 mg/dL 8.9 9.5 9.2  Total Protein 6.5 - 8.1 g/dL - - 6.5  Total Bilirubin 0.3 - 1.2 mg/dL - - 0.4  Alkaline Phos 38 - 126 U/L -  - 48  AST 15 - 41 U/L - - 27  ALT 0 - 44 U/L - - 53(H)     ASSESSMENT & PLAN Murray Guzzetta is a 30 y.o. female who presents to the clinic for evaluation for elevated anticardiolipin antibody. I reviewed the elevated anticardiolipin IgM of 28 was measured on 05/26/2021.This is not diagnostic for antiphospholipid antibody syndrome so I would not recommend any intervention. Upon review of hypercoagulable workup from November 2022, there was evidence of protein S activity which could secondary to acute CVA when it was measured. Recommend to repeat protein S level and activity today. Additionally, due to patient's chronic fatigue and history of heavy menstrual cycle, we will check iron panel to evaluate for iron deficiency.   #Elevated anticardiolipin IgM level: --Reviewed most recent evated anticardiolipin IgM level of 28. Prior level was 19.  --Not diagnostic for antiphospholipid antibody syndrome --No further intervention is required.   #Decreased protein S activity: --Decreased to 37% in November 2021 when admitted for CVA --Repeat levels today  #Chronic fatigue: --Patient reports history iron deficiency anemia 2/2 to abnormal menstrual bleeding. --Repeat CBC today along with iron and TIBC, ferritin and retic panel  Orders Placed This Encounter  Procedures   Protein S activity*    Standing Status:   Future    Number of Occurrences:   1    Standing Expiration Date:   06/16/2022   Protein S, total    Standing Status:   Future    Number of Occurrences:   1    Standing Expiration Date:   06/16/2022   CBC with Differential (Cancer Center Only)    Standing Status:   Future    Number of Occurrences:   1    Standing Expiration Date:   06/16/2022   Ferritin    Standing Status:   Future    Number of Occurrences:   1    Standing Expiration Date:   06/16/2022   Iron and TIBC    Standing Status:   Future  Number of Occurrences:   1    Standing Expiration Date:   06/16/2022   Retic Panel     Standing Status:   Future    Number of Occurrences:   1    Standing Expiration Date:   06/16/2022    All questions were answered. The patient knows to call the clinic with any problems, questions or concerns.  I have spent a total of 60 minutes minutes of face-to-face and non-face-to-face time, preparing to see the patient, obtaining and/or reviewing separately obtained history, performing a medically appropriate examination, counseling and educating the patient, ordering tests,  documenting clinical information in the electronic health record, and care coordination.   Georga Kaufmann, PA-C Department of Hematology/Oncology [facility] at [facility] Phone: [phone]

## 2021-06-17 LAB — IRON AND TIBC
Iron: 50 ug/dL (ref 41–142)
Saturation Ratios: 12 % — ABNORMAL LOW (ref 21–57)
TIBC: 431 ug/dL (ref 236–444)
UIBC: 381 ug/dL (ref 120–384)

## 2021-06-17 LAB — FERRITIN: Ferritin: 19 ng/mL (ref 11–307)

## 2021-06-18 LAB — PROTEIN S, TOTAL: Protein S Ag, Total: 76 % (ref 60–150)

## 2021-06-18 LAB — PROTEIN S ACTIVITY: Protein S Activity: 97 % (ref 63–140)

## 2021-06-20 ENCOUNTER — Other Ambulatory Visit: Payer: Self-pay | Admitting: Gynecologic Oncology

## 2021-06-20 ENCOUNTER — Telehealth: Payer: Self-pay

## 2021-06-20 DIAGNOSIS — N710 Acute inflammatory disease of uterus: Secondary | ICD-10-CM

## 2021-06-20 MED ORDER — METRONIDAZOLE 500 MG PO TABS: 500.0000 mg | ORAL_TABLET | Freq: Three times a day (TID) | ORAL | 0 refills | Status: DC

## 2021-06-20 MED ORDER — DOXYCYCLINE HYCLATE 100 MG PO TABS: 100.0000 mg | ORAL_TABLET | Freq: Two times a day (BID) | ORAL | 0 refills | Status: DC

## 2021-06-20 NOTE — Progress Notes (Signed)
See RN note. Patient calling post-operatively from [facility] with complaints of foul smelling yellowish discharge and pelvic pain. Her temperature was 99.4 today and she has been taking ibuprofen and Tylenol. Plan to begin treatment of endometritis with doxycycline and flagyl. Discussed with on call Dr. Andrey Farmer.

## 2021-06-20 NOTE — Telephone Encounter (Signed)
Spoke with patient regarding her pain over the weekend. She reports sharp pelvic pain with a thick yellow discharge. She is changing her panty liner every few hours. She reports hot sweats and temperature of 99.4 and has been taking tylenol and advil around the clock. She states she feels better when laying down and pain worsening when sitting.  Warner Mccreedy, NP notified. Prescription for Clindamycin/Flagyl has been called in. Instructed patient not to consume alcohol while taking antibiotic. Patient verbalized understanding. She will call with any questions or concerns.

## 2021-06-21 ENCOUNTER — Telehealth: Payer: Self-pay | Admitting: Physician Assistant

## 2021-06-21 MED ORDER — FERROUS SULFATE 325 (65 FE) MG PO TBEC: 325.0000 mg | DELAYED_RELEASE_TABLET | Freq: Every day | ORAL | 3 refills | Status: DC

## 2021-06-21 NOTE — Telephone Encounter (Signed)
I called [name] to review her labs from 06/16/2021. There was very mild anemia with Hgb of 11.8. Ferritin was low at 19 and Iron saturation was 12%. Recommended to start ferrous sulfate 325 mg once daily with a source of vitamin C. I sent a prescription to the pharmacy on file.   Regarding Protein S level and activity, both were within normal limits. No further intervention is required.   Patient will return to clinic in 3 months to repeat iron studies. Patient expressed understanding and satisfaction with the plan provided.

## 2021-06-27 ENCOUNTER — Telehealth: Payer: Self-pay | Admitting: Physician Assistant

## 2021-06-27 ENCOUNTER — Ambulatory Visit: Payer: Medicaid Other | Admitting: Gynecologic Oncology

## 2021-06-27 NOTE — Telephone Encounter (Signed)
Scheduled per sch msg. Called and left msg. Mailed printout  

## 2021-07-08 ENCOUNTER — Encounter: Payer: Medicaid Other | Admitting: Gynecologic Oncology
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
## 2021-07-15 ENCOUNTER — Inpatient Hospital Stay: Payer: Medicaid Other | Attending: Gynecologic Oncology | Admitting: Gynecologic Oncology

## 2021-07-15 ENCOUNTER — Other Ambulatory Visit: Payer: Self-pay

## 2021-07-15 ENCOUNTER — Encounter: Payer: Self-pay | Admitting: Gynecologic Oncology

## 2021-07-15 ENCOUNTER — Encounter: Payer: Medicaid Other | Admitting: Gynecologic Oncology

## 2021-07-15 VITALS — BP 143/80 | HR 84 | Temp 97.6°F | Resp 18 | Ht 65.0 in | Wt 192.2 lb

## 2021-07-15 DIAGNOSIS — Z9889 Other specified postprocedural states: Secondary | ICD-10-CM

## 2021-07-15 DIAGNOSIS — N939 Abnormal uterine and vaginal bleeding, unspecified: Secondary | ICD-10-CM

## 2021-07-15 NOTE — Patient Instructions (Addendum)
It was good to see you today.  You are healing well from surgery.  I will send a message in my note from today to your gynecologist.  I am hopeful that going back to a chiropractor might help with your back pain and your right-sided pain.  If you do not see some improvement, please reach out to your gynecologist about the option of getting on some oral medication to see if we can suppress your ovaries.  The IUD stops ovulation in some patients but not all.

## 2021-08-08 ENCOUNTER — Telehealth: Payer: Self-pay | Admitting: *Deleted

## 2021-08-08 NOTE — Telephone Encounter (Signed)
Tabitha Smith (Key: [id number]) Aimovig 70MG /ML auto-injectors    Sent PA for Aimovig Waiting on approval

## 2021-08-08 NOTE — Telephone Encounter (Signed)
error 

## 2021-08-08 NOTE — Telephone Encounter (Signed)
PA  for Aimovig has been approved .  Dates Approved 08/08/2021-08/08/2022 Will inform patient through [facility]

## 2021-09-01 ENCOUNTER — Other Ambulatory Visit: Payer: Self-pay | Admitting: Neurology

## 2021-09-02 ENCOUNTER — Encounter (HOSPITAL_BASED_OUTPATIENT_CLINIC_OR_DEPARTMENT_OTHER): Payer: Self-pay | Admitting: *Deleted

## 2021-09-02 ENCOUNTER — Emergency Department (HOSPITAL_BASED_OUTPATIENT_CLINIC_OR_DEPARTMENT_OTHER)
Admission: EM | Admit: 2021-09-02 | Discharge: 2021-09-02 | Disposition: A | Discharge status: Home / Self Care | Payer: Medicaid Other | Attending: Emergency Medicine | Admitting: Emergency Medicine

## 2021-09-02 ENCOUNTER — Other Ambulatory Visit: Payer: Self-pay

## 2021-09-02 DIAGNOSIS — F17210 Nicotine dependence, cigarettes, uncomplicated: Secondary | ICD-10-CM | POA: Insufficient documentation

## 2021-09-02 DIAGNOSIS — R21 Rash and other nonspecific skin eruption: Secondary | ICD-10-CM | POA: Insufficient documentation

## 2021-09-02 DIAGNOSIS — M542 Cervicalgia: Secondary | ICD-10-CM | POA: Insufficient documentation

## 2021-09-02 DIAGNOSIS — I509 Heart failure, unspecified: Secondary | ICD-10-CM | POA: Insufficient documentation

## 2021-09-02 MED ORDER — METHOCARBAMOL 750 MG PO TABS: 750.0000 mg | ORAL_TABLET | Freq: Three times a day (TID) | ORAL | 0 refills | Status: DC | PRN

## 2021-09-02 NOTE — ED Triage Notes (Addendum)
Pt sitting at desk working on Monday, felt pain in rt neck going into scapula area. Tried OTC meds without relief. Itchy rash on chest and shoulders.

## 2021-09-02 NOTE — ED Provider Notes (Signed)
[facility] EMERGENCY DEPT Provider Note   CSN: [id number] Arrival date & time: 09/02/21  1014     History No chief complaint on file.   Tabitha Smith is a 30 y.o. female with past medical history of stroke, protein S deficiency, acute heart failure that has resolved, who presents today for evaluation of right-sided neck pain going into her scapula.  Her pain started Monday when she was sitting at the desk and working in her neck and has since progressed into the mid upper back.  She denies any cough, chest pain, or shortness of breath.  She has tried Aleve, Advil, Tylenol, Biofreeze, and heating pad without relief.  She denies any specific injuries.  She denies any weakness, numbness, or tingling.  She feels like this pain is getting worse.  She also reports concern for a rash.  Rash started about a month ago, was initially in the middle of her chest and has spread through the upper chest, along the superior aspect of both breasts and now onto the back of her neck.  The rash is itchy.  She has tried multiple lotions without relief.  No history of similar rash.  She did get a Mirena placed August 16 and had some antibiotics.  No known close contacts with similar.  The rash does not hurt.  HPI     Past Medical History:  Diagnosis Date   Adnexal mass    Anxiety    CHF (congestive heart failure) (HCC)    Depression    History of acute heart failure    Hx of septic shock    IBS (irritable bowel syndrome)    Pneumonia    PONV (postoperative nausea and vomiting)    Sinusitis    Stroke [facility])     Patient Active Problem List   Diagnosis Date Noted   Protein S deficiency (HCC) 06/16/2021   Fatigue 06/16/2021   Adnexal mass 05/24/2021   Abnormal uterine bleeding 05/24/2021   History of stroke 05/24/2021   Secondary cardiomyopathy (HCC)    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   Sepsis (HCC) 09/03/2020   Septic shock (HCC) 09/02/2020   Depression  09/02/2020   History of suicidal ideation 09/02/2020   Aspiration pneumonia of left lower lobe (HCC)    Demand ischemia of myocardium (HCC)    Chronic migraine without aura, with intractable migraine, so stated, with status migrainosus 08/06/2018    Past Surgical History:  Procedure Laterality Date   BUBBLE STUDY  09/06/2020   Procedure: BUBBLE STUDY;  Surgeon: Pricilla Riffle, MD;  Location: [facility] ENDOSCOPY;  Service: Cardiovascular;;   DILATION AND CURETTAGE OF UTERUS N/A 06/14/2021   Procedure: DILATATION AND CURETTAGE;  Surgeon: Carver Fila, MD;  Location: [facility] ORS;  Service: Gynecology;  Laterality: N/A;   INTRAUTERINE DEVICE (IUD) INSERTION N/A 06/14/2021   Procedure: MERINA INTRAUTERINE DEVICE (IUD) INSERTION;  Surgeon: Carver Fila, MD;  Location: [facility] ORS;  Service: Gynecology;  Laterality: N/A;   NO PAST SURGERIES     stitches to abdomen      for a cut    TEE WITHOUT CARDIOVERSION N/A 09/06/2020   Procedure: TRANSESOPHAGEAL ECHOCARDIOGRAM (TEE);  Surgeon: Pricilla Riffle, MD;  Location: [facility] ENDOSCOPY;  Service: Cardiovascular;  Laterality: N/A;     OB History     Gravida  2   Para  1   Term      Preterm      AB  1   Living  1  SAB      IAB      Ectopic      Multiple      Live Births           Obstetric Comments  SVD in 2017         Family History  Problem Relation Age of Onset   Diabetes Mother    Seizures Mother    Cancer Mother        Colon   Hypertension Mother    Stroke Mother    Migraines Neg Hx    Headache Neg Hx    Breast cancer Neg Hx    Ovarian cancer Neg Hx    Endometrial cancer Neg Hx    Pancreatic cancer Neg Hx    Prostate cancer Neg Hx     Social History   Tobacco Use   Smoking status: Every Day    Packs/day: 0.50    Types: Cigarettes   Smokeless tobacco: Never  Vaping Use   Vaping Use: Never used  Substance Use Topics   Alcohol use: Yes    Comment: occasionally   Drug use: Never    Home  Medications Prior to Admission medications   Medication Sig Start Date End Date Taking? Authorizing Provider  methocarbamol (ROBAXIN) 750 MG tablet Take 1-2 tablets (750-1,500 mg total) by mouth 3 (three) times daily as needed for muscle spasms. 09/02/21  Yes Cristina Gong, PA-C  aspirin EC 81 MG tablet Take 81 mg by mouth daily. Swallow whole.    [provider]  doxycycline (VIBRA-TABS) 100 MG tablet Take 1 tablet (100 mg total) by mouth 2 (two) times daily. Patient not taking: Reported on 07/15/2021 06/20/21   Warner Mccreedy D, NP  DULoxetine (CYMBALTA) 60 MG capsule Take 120 mg by mouth daily. 05/02/21   [provider]  Erenumab-aooe (AIMOVIG) 70 MG/ML SOAJ Inject 70 mg into the skin every 14 (fourteen) days. 06/14/20   Anson Fret, MD  ferrous sulfate 325 (65 FE) MG EC tablet Take 1 tablet (325 mg total) by mouth daily with breakfast. 06/21/21   Briant Cedar, PA-C  linaclotide (LINZESS) 290 MCG CAPS capsule Take 290 mcg by mouth daily as needed (constipation).    [provider]  metroNIDAZOLE (FLAGYL) 500 MG tablet Take 1 tablet (500 mg total) by mouth 3 (three) times daily. Patient not taking: Reported on 07/15/2021 06/20/21   Warner Mccreedy D, NP  prazosin (MINIPRESS) 1 MG capsule Take 1 mg by mouth 2 (two) times daily. Pt works 3rd shift so takes med at [employer] and at bedtime. 05/02/21   [provider]  QUEtiapine Fumarate (SEROQUEL XR) 150 MG 24 hr tablet Take 150 mg by mouth at bedtime. 05/20/21   [provider]  zolpidem (AMBIEN) 10 MG tablet Take 10 mg by mouth at bedtime as needed for sleep.    [provider]    Allergies    Amoxicillin and Penicillins  Review of Systems   Review of Systems  Constitutional:  Negative for chills, fatigue and fever.  Gastrointestinal:  Negative for abdominal pain and nausea.  Musculoskeletal:  Positive for back pain and neck pain. Negative for gait problem.  Skin:  Positive for color  change and rash.  Neurological:  Negative for weakness and headaches.  All other systems reviewed and are negative.  Physical Exam Updated Vital Signs BP (!) 140/98   Pulse 70   Temp 98.9 F (37.2 C)   Resp 14  Ht 5\' 6"  (1.676 m)   Wt 81.6 kg   SpO2 97%   BMI 29.05 kg/m   Physical Exam Vitals and nursing note reviewed.  Constitutional:      General: She is not in acute distress.    Appearance: She is not ill-appearing.  HENT:     Head: Atraumatic.  Eyes:     Conjunctiva/sclera: Conjunctivae normal.  Neck:     Trachea: Phonation normal.     Comments: There is tenderness to palpation along the right sided neck.  This extends into the superior posterior shoulder along the general distribution of the trapezius muscle. Palpation here recreates and exacerbates her reported pain.  She has pain with turning her head.  She does not have any weakness in the arms bilaterally however does have slight pain with movements of the right shoulder. Cardiovascular:     Rate and Rhythm: Normal rate and regular rhythm.     Pulses: Normal pulses.          Radial pulses are 2+ on the right side and 2+ on the left side.       Posterior tibial pulses are 2+ on the right side and 2+ on the left side.     Heart sounds: Normal heart sounds. No murmur heard. Pulmonary:     Effort: Pulmonary effort is normal. No accessory muscle usage or respiratory distress.     Breath sounds: Normal breath sounds and air entry. No decreased breath sounds, wheezing, rhonchi or rales.  Abdominal:     General: There is no distension.  Musculoskeletal:     Cervical back: Torticollis present. Pain with movement present.     Right lower leg: No edema.     Left lower leg: No edema.     Comments: 5/5 grip strength bilaterally.  No edema bilateral lower extremities.  Lymphadenopathy:     Cervical: No cervical adenopathy.  Skin:    General: Skin is warm and dry.     Comments: There is rash present over the upper chest  bilaterally extending towards the shoulders and onto the back of the neck.  The rash is slightly scaly appearing and looks dry.  It is hyperpigmented when compared to surrounding skin.  There is no secondary bacterial infection, the rash is nontender.    Neurological:     Mental Status: She is alert. Mental status is at baseline.     Motor: No weakness.     Comments: Awake and alert, answers all questions appropriately.  Speech is not slurred.  Psychiatric:        Mood and Affect: Mood normal.        Behavior: Behavior normal.    ED Results / Procedures / Treatments   Labs (all labs ordered are listed, but only abnormal results are displayed) Labs Reviewed - No data to display  EKG None  Radiology No results found.  Procedures Procedures   Medications Ordered in ED Medications - No data to display  ED Course  I have reviewed the triage vital signs and the nursing notes.  Pertinent labs & imaging results that were available during my care of the patient were reviewed by me and considered in my medical decision making (see chart for details).    MDM Rules/Calculators/A&P                          Patient presents today for evaluation of 2 concerns. Her primary concern is pain  in the right sided neck and shoulder for 4 to 5 days. Her pain is easily recreated with palpation over the trapezius muscle.  She has pain with turning her head, has not had any trauma and is at her neurovascular baseline.  Pain has since extended slightly into the upper back, however I am able to recreate and exacerbated with palpation on physical exam. She is afebrile, not tachycardic or tachypneic and 100% on room air.  She does not have chest pain.  Discussed options for further evaluation specially given her medical history however clinically this appears to be very consistent with musculoskeletal pain such as torticollis and trapezius muscle spasm. She has not had any leg swelling, no hemoptysis.   Doubt ACS. Recommended conservative care, muscle relaxers, and outpatient follow-up.  Patient is in agreement with this plan and states her understanding.  She also reports a rash that has been present for over a month and has been slowly spreading over her upper chest and onto the back of her neck.  Rash appears dry.  It does not appear consistent with a life-threatening rash, she denies any new exposures at this time. As the rash generally appears dry I recommended conservative care including limited hot showers, and instead taking tepid showers and limiting the amount of time followed by immediate application of moisturizing creams.  This does not appear to be a bacterial rash.  I recommended outpatient primary care and dermatology follow-up and she states her understanding.  Return precautions were discussed with patient who states their understanding.  At the time of discharge patient denied any unaddressed complaints or concerns.  Patient is agreeable for discharge home.  Note: Portions of this report may have been transcribed using voice recognition software. Every effort was made to ensure accuracy; however, inadvertent computerized transcription errors may be present   Final Clinical Impression(s) / ED Diagnoses Final diagnoses:  Rash and nonspecific skin eruption  Neck pain without injury    Rx / DC Orders ED Discharge Orders          Ordered    methocarbamol (ROBAXIN) 750 MG tablet  3 times daily PRN        09/02/21 1328             Norman Clay 09/03/21 0014    Benjiman Core, MD 09/03/21 [phone]

## 2021-09-02 NOTE — Discharge Instructions (Addendum)
For your rash please limit hot showers, take warm or tepid showers instead and make your showers quick.  Please put lotion on the rash with in a few minutes of getting out.   The best way to get rid of muscle pain is by taking NSAIDS, using heat, massage therapy, and gentle stretching/range of motion exercises.  Please take Ibuprofen (Advil, motrin) and Tylenol (acetaminophen) to relieve your pain.    You may take up to 600 MG (3 pills) of normal strength ibuprofen every 8 hours as needed.   You make take tylenol, up to 1,000 mg (two extra strength pills) every 8 hours as needed.   It is safe to take ibuprofen and tylenol at the same time as they work differently.   Do not take more than 3,000 mg tylenol in a 24 hour period (not more than one dose every 8 hours.  Please check all medication labels as many medications such as pain and cold medications may contain tylenol.  Do not drink alcohol while taking these medications.  Do not take other NSAID'S while taking ibuprofen (such as aleve or naproxen).  Please take ibuprofen with food to decrease stomach upset.  You are being prescribed a medication which may make you sleepy. For 24 hours after one dose please do not drive, operate heavy machinery, care for a small child with out another adult present, or perform any activities that may cause harm to you or someone else if you were to fall asleep or be impaired.

## 2021-09-26 ENCOUNTER — Other Ambulatory Visit: Payer: Self-pay | Admitting: Physician Assistant

## 2021-09-26 DIAGNOSIS — E611 Iron deficiency: Secondary | ICD-10-CM

## 2021-09-27 ENCOUNTER — Other Ambulatory Visit: Payer: Self-pay

## 2021-09-27 ENCOUNTER — Inpatient Hospital Stay (HOSPITAL_BASED_OUTPATIENT_CLINIC_OR_DEPARTMENT_OTHER): Payer: Medicaid Other

## 2021-09-27 ENCOUNTER — Inpatient Hospital Stay: Payer: Medicaid Other | Attending: Gynecologic Oncology | Admitting: Physician Assistant

## 2021-09-27 DIAGNOSIS — Z800 Family history of malignant neoplasm of digestive organs: Secondary | ICD-10-CM | POA: Diagnosis not present

## 2021-09-27 DIAGNOSIS — D509 Iron deficiency anemia, unspecified: Secondary | ICD-10-CM | POA: Insufficient documentation

## 2021-09-27 DIAGNOSIS — R768 Other specified abnormal immunological findings in serum: Secondary | ICD-10-CM | POA: Insufficient documentation

## 2021-09-27 DIAGNOSIS — R55 Syncope and collapse: Secondary | ICD-10-CM | POA: Diagnosis not present

## 2021-09-27 DIAGNOSIS — F17210 Nicotine dependence, cigarettes, uncomplicated: Secondary | ICD-10-CM | POA: Diagnosis not present

## 2021-09-27 DIAGNOSIS — R42 Dizziness and giddiness: Secondary | ICD-10-CM | POA: Insufficient documentation

## 2021-09-27 DIAGNOSIS — Z7901 Long term (current) use of anticoagulants: Secondary | ICD-10-CM | POA: Diagnosis not present

## 2021-09-27 DIAGNOSIS — Z7902 Long term (current) use of antithrombotics/antiplatelets: Secondary | ICD-10-CM | POA: Insufficient documentation

## 2021-09-27 DIAGNOSIS — Z7982 Long term (current) use of aspirin: Secondary | ICD-10-CM | POA: Diagnosis not present

## 2021-09-27 DIAGNOSIS — Z8673 Personal history of transient ischemic attack (TIA), and cerebral infarction without residual deficits: Secondary | ICD-10-CM | POA: Diagnosis not present

## 2021-09-27 DIAGNOSIS — Z975 Presence of (intrauterine) contraceptive device: Secondary | ICD-10-CM | POA: Insufficient documentation

## 2021-09-27 DIAGNOSIS — R519 Headache, unspecified: Secondary | ICD-10-CM | POA: Insufficient documentation

## 2021-09-27 DIAGNOSIS — E611 Iron deficiency: Secondary | ICD-10-CM

## 2021-09-27 LAB — CBC WITH DIFFERENTIAL (CANCER CENTER ONLY)
Abs Immature Granulocytes: 0.02 10*3/uL (ref 0.00–0.07)
Basophils Absolute: 0 10*3/uL (ref 0.0–0.1)
Basophils Relative: 0 %
Eosinophils Absolute: 0.2 10*3/uL (ref 0.0–0.5)
Eosinophils Relative: 2 %
HCT: 36.4 % (ref 36.0–46.0)
Hemoglobin: 12.5 g/dL (ref 12.0–15.0)
Immature Granulocytes: 0 %
Lymphocytes Relative: 28 %
Lymphs Abs: 2.8 10*3/uL (ref 0.7–4.0)
MCH: 30 pg (ref 26.0–34.0)
MCHC: 34.3 g/dL (ref 30.0–36.0)
MCV: 87.3 fL (ref 80.0–100.0)
Monocytes Absolute: 0.7 10*3/uL (ref 0.1–1.0)
Monocytes Relative: 7 %
Neutro Abs: 6.2 10*3/uL (ref 1.7–7.7)
Neutrophils Relative %: 63 %
Platelet Count: 284 10*3/uL (ref 150–400)
RBC: 4.17 MIL/uL (ref 3.87–5.11)
RDW: 13.6 % (ref 11.5–15.5)
WBC Count: 10 10*3/uL (ref 4.0–10.5)
nRBC: 0 % (ref 0.0–0.2)

## 2021-09-27 LAB — IRON AND TIBC
Iron: 40 ug/dL (ref 28–170)
Saturation Ratios: 8 % — ABNORMAL LOW (ref 10.4–31.8)
TIBC: 493 ug/dL — ABNORMAL HIGH (ref 250–450)
UIBC: 453 ug/dL

## 2021-09-27 LAB — RETIC PANEL
Immature Retic Fract: 13.7 % (ref 2.3–15.9)
RBC.: 4.25 MIL/uL (ref 3.87–5.11)
Retic Count, Absolute: 57 10*3/uL (ref 19.0–186.0)
Retic Ct Pct: 1.3 % (ref 0.4–3.1)
Reticulocyte Hemoglobin: 33.7 pg (ref >27.9)

## 2021-09-27 LAB — FERRITIN: Ferritin: 11 ng/mL (ref 11–307)

## 2021-09-27 NOTE — Progress Notes (Signed)
[facility] [phone]   [phone]  PROGRESS NOTE  Patient Care Team: [name], NP as PCP - General (Nurse Practitioner) Donato Heinz, MD as PCP - Cardiology (Cardiology)  Hematological/Oncological History 1) Admitted from [id number] for embolic stroke. MRI brain revealed multifocal L occipitotemporal and L cerebellar infarcts.She was discharged on Aspirin and Plavix. Hypercoagulable workup was positive for mild elevation of anticardiolipin Igm at 19 and decreased protein S activity at 37%.  2) 05/26/2021: Repeat cardiolipin antibody panel revealed mild increase in anticardiolipin IgM at 28.  3) 06/16/2021: Establish care with Tabitha Query PA-C  CHIEF COMPLAINTS/PURPOSE OF CONSULTATION:  "Elevated anticardiolipin level "  HISTORY OF PRESENTING ILLNESS:  Tabitha Smith 30 y.o. female returns for follow-up for iron deficiency anemia.  She is unaccompanied for this visit.  On exam today, Tabitha Smith continues to have chronic fatigue.  She is able to complete her ADLs on her own.  She reports stable appetite but has noticed progressive weight gain over the last several months.  She has gained approximately 10 pounds in the last 2 months.  She denies any nausea, vomiting or abdominal pain.  She experiences occasional episodes of constipation due to taking oral iron pills.  She has a bowel movement every other day.  She denies easy bruising or signs of bleeding.  Her menstrual bleeding is light and she currently has an IUD in place.  She continues to have chronic headaches and dizziness.  She does add having presyncopal episodes.  She denies any fevers, chills, night sweats, shortness of breath, chest pain, cough. She has no other complaints. Rest of the 10 point ROS is below.   MEDICAL HISTORY:  Past Medical History:  Diagnosis Date   Adnexal mass    Anxiety    CHF (congestive heart failure) ([place])    Depression    History of acute  heart failure    Hx of septic shock    IBS (irritable bowel syndrome)    Pneumonia    PONV (postoperative nausea and vomiting)    Sinusitis    Stroke [facility])     SURGICAL HISTORY: Past Surgical History:  Procedure Laterality Date   BUBBLE STUDY  09/06/2020   Procedure: BUBBLE STUDY;  Surgeon: Fay Records, MD;  Location: [place];  Service: Cardiovascular;;   DILATION AND CURETTAGE OF UTERUS N/A 06/14/2021   Procedure: DILATATION AND CURETTAGE;  Surgeon: Lafonda Mosses, MD;  Location: [facility] ORS;  Service: Gynecology;  Laterality: N/A;   INTRAUTERINE DEVICE (IUD) INSERTION N/A 06/14/2021   Procedure: [place] (IUD) INSERTION;  Surgeon: Lafonda Mosses, MD;  Location: [facility]S;  Service: Gynecology;  Laterality: N/A;   NO PAST SURGERIES     stitches to abdomen      for a cut    TEE WITHOUT CARDIOVERSION N/A 09/06/2020   Procedure: TRANSESOPHAGEAL ECHOCARDIOGRAM (TEE);  Surgeon: Fay Records, MD;  Location: [facility] ENDOSCOPY;  Service: Cardiovascular;  Laterality: N/A;    SOCIAL HISTORY: Social History   Socioeconomic History   Marital status: Single    Spouse name: Not on file   Number of children: 1   Years of education: Not on file   Highest education level: High school graduate  Occupational History   Not on file  Tobacco Use   Smoking status: Every Day    Packs/day: 0.50    Types: Cigarettes   Smokeless tobacco: Never  Vaping Use   Vaping Use: Never used  Substance  and Sexual Activity   Alcohol use: Yes    Comment: occasionally   Drug use: Never   Sexual activity: Yes    Birth control/protection: I.U.D.  Other Topics Concern   Not on file  Social History Narrative   Lives at home with family   Left handed   Caffeine: coffee, 2-4 cups/day   Social Determinants of Health   Financial Resource Strain: Not on file  Food Insecurity: Not on file  Transportation Needs: Not on file  Physical Activity: Not on file  Stress: Not on file  Social  Connections: Not on file  Intimate Partner Violence: Not on file    FAMILY HISTORY: Family History  Problem Relation Age of Onset   Diabetes Mother    Seizures Mother    Cancer Mother        Colon   Hypertension Mother    Stroke Mother    Migraines Neg Hx    Headache Neg Hx    Breast cancer Neg Hx    Ovarian cancer Neg Hx    Endometrial cancer Neg Hx    Pancreatic cancer Neg Hx    Prostate cancer Neg Hx     ALLERGIES:  is allergic to amoxicillin and penicillins.  MEDICATIONS:  Current Outpatient Medications  Medication Sig Dispense Refill   aspirin EC 81 MG tablet Take 81 mg by mouth daily. Swallow whole.     DULoxetine (CYMBALTA) 60 MG capsule Take 120 mg by mouth daily.     Erenumab-aooe (AIMOVIG) 70 MG/ML SOAJ Inject 70 mg into the skin every 14 (fourteen) days. 1.12 mL 6   ferrous sulfate 325 (65 FE) MG EC tablet Take 1 tablet (325 mg total) by mouth daily with breakfast. 30 tablet 3   linaclotide (LINZESS) 290 MCG CAPS capsule Take 290 mcg by mouth daily as needed (constipation).     prazosin (MINIPRESS) 1 MG capsule Take 1 mg by mouth 2 (two) times daily. Pt works 3rd shift so takes med at [employer] and at bedtime.     QUEtiapine Fumarate (SEROQUEL XR) 150 MG 24 hr tablet Take 150 mg by mouth at bedtime.     zolpidem (AMBIEN) 10 MG tablet Take 10 mg by mouth at bedtime as needed for sleep.     doxycycline (VIBRA-TABS) 100 MG tablet Take 1 tablet (100 mg total) by mouth 2 (two) times daily. (Patient not taking: Reported on 07/15/2021) 14 tablet 0   methocarbamol (ROBAXIN) 750 MG tablet Take 1-2 tablets (750-1,500 mg total) by mouth 3 (three) times daily as needed for muscle spasms. 18 tablet 0   metroNIDAZOLE (FLAGYL) 500 MG tablet Take 1 tablet (500 mg total) by mouth 3 (three) times daily. (Patient not taking: Reported on 07/15/2021) 21 tablet 0   No current facility-administered medications for this visit.    REVIEW OF SYSTEMS:   Constitutional: ( - ) fevers, ( - )  chills  , ( - ) night sweats Eyes: ( - ) blurriness of vision, ( - ) double vision, ( - ) watery eyes Ears, nose, mouth, throat, and face: ( - ) mucositis, ( - ) sore throat Respiratory: ( - ) cough, ( - ) dyspnea, ( - ) wheezes Cardiovascular: ( - ) palpitation, ( - ) chest discomfort, ( - ) lower extremity swelling Gastrointestinal:  ( - ) nausea, ( - ) heartburn, ( - ) change in bowel habits Skin: ( - ) abnormal skin rashes Lymphatics: ( - ) new lymphadenopathy, ( - )  easy bruising Neurological: ( - ) numbness, ( - ) tingling, ( - ) new weaknesses Behavioral/Psych: ( - ) mood change, ( - ) new changes  All other systems were reviewed with the patient and are negative.  PHYSICAL EXAMINATION: ECOG PERFORMANCE STATUS: 1 - Symptomatic but completely ambulatory  Vitals:   09/27/21 1519  BP: 132/82  Pulse: 85  Temp: 97.9 F (36.6 C)  SpO2: 100%   Filed Weights   09/27/21 1519  Weight: 201 lb 4.8 oz (91.3 kg)    GENERAL: well appearing African American female in NAD  SKIN: skin color, texture, turgor are normal, no rashes or significant lesions EYES: conjunctiva are pink and non-injected, sclera clear LYMPH:  no palpable lymphadenopathy in the cervical or supraclavicular lymph nodes.  LUNGS: clear to auscultation and percussion with normal breathing effort HEART: regular rate & rhythm and no murmurs and no lower extremity edema ABDOMEN: soft, non-tender, non-distended, normal bowel sounds Musculoskeletal: no cyanosis of digits and no clubbing  PSYCH: alert & oriented x 3, fluent speech NEURO: no focal motor/sensory deficits  LABORATORY DATA:  I have reviewed the data as listed CBC Latest Ref Rng & Units 09/27/2021 06/16/2021 06/08/2021  WBC 4.0 - 10.5 K/uL 10.0 9.0 9.8  Hemoglobin 12.0 - 15.0 g/dL 12.5 11.8(L) 11.5(L)  Hematocrit 36.0 - 46.0 % 36.4 35.1(L) 34.8(L)  Platelets 150 - 400 K/uL 284 298 282    CMP Latest Ref Rng & Units 06/08/2021 09/07/2020 09/06/2020  Glucose 70 - 99  mg/dL 85 85 90  BUN 6 - 20 mg/dL 11 6 7   Creatinine 0.44 - 1.00 mg/dL 0.90 0.93 1.00  Sodium 135 - 145 mmol/L 141 136 137  Potassium 3.5 - 5.1 mmol/L 3.7 3.4(L) 3.5  Chloride 98 - 111 mmol/L 110 104 106  CO2 22 - 32 mmol/L 23 21(L) 23  Calcium 8.9 - 10.3 mg/dL 8.9 9.5 9.2  Total Protein 6.5 - 8.1 g/dL - - 6.5  Total Bilirubin 0.3 - 1.2 mg/dL - - 0.4  Alkaline Phos 38 - 126 U/L - - 48  AST 15 - 41 U/L - - 27  ALT 0 - 44 U/L - - 53(H)     ASSESSMENT & PLAN Devoiry Reisch is a 30 y.o. female returns for a follow up for iron deficiency anemia.   #Elevated anticardiolipin IgM level: --Reviewed most recent evated anticardiolipin IgM level of 28. Prior level was 19.  --Does not meet criteria for antiphospholipid antibody syndrome --No further intervention is required.   #Decreased protein S activity: --Decreased to 37% in November 2021 when admitted for CVA --Repeat Protein S level and activity was checked on 06/16/2021 and was within normal limits.  --No further intervention is required.   #Iron deficiency anemia: --Currently on ferrous sulfate 325 mg once daily, does cause some constipation.  --Labs today show that Hgb has improved to 12.5. Iron levels have worsened. Ferritin is 11, TIBC 493 and iron saturation is 8%.  --Due to ongoing fatigue, it is reasonable to try IV iron infusions. We will request IV feraheme x 2 doses at [employer].  --Since patient reports very light menstrual periods, we will make a referral to gastroenterology to further evaluate cause of iron deficiency.  --RTC in 8 weeks with repeat labs.   Orders Placed This Encounter  Procedures   Ambulatory referral to Gastroenterology    Referral Priority:   Routine    Referral Type:   Consultation    Referral Reason:  Specialty Services Required    Number of Visits Requested:   1     All questions were answered. The patient knows to call the clinic with any problems, questions or  concerns.  I have spent a total of 25 minutes minutes of face-to-face and non-face-to-face time, preparing to see the patient, obtaining and/or reviewing separately obtained history, performing a medically appropriate examination, counseling and educating the patient, ordering medications,  documenting clinical information in the electronic health record, and care coordination.   Georga Kaufmann, PA-C Department of Hematology/Oncology [facility] at [facility] Phone: [phone]

## 2021-09-28 ENCOUNTER — Telehealth: Payer: Medicaid Other | Admitting: Pharmacy Technician

## 2021-09-28 NOTE — Telephone Encounter (Addendum)
Dr. Mariane Baumgarten,  Lorain Childes note:  Auth Submission: NO AUTH NEEDED Payer: [employer] Medication & CPT/J Code(s) submitted: Feraheme (ferumoxytol) [id number] Route of submission (phone, fax, portal): PHONE [phone] Auth type: Buy/Bill Units/visits requested: 2 Reference number: [id number] Approval from: 09/28/21 to 11/28/21   Patient will be scheduled as soon as possible.

## 2021-10-03 ENCOUNTER — Telehealth: Payer: Self-pay | Admitting: Adult Health

## 2021-10-03 NOTE — Telephone Encounter (Signed)
Pt called, Have had a migraine since last night.  Would like a call from the nurse to discuss why refill for Erenumab-aooe (AIMOVIG) 70 MG/ML SOAJ was refused

## 2021-10-05 ENCOUNTER — Other Ambulatory Visit: Payer: Self-pay

## 2021-10-05 ENCOUNTER — Ambulatory Visit (INDEPENDENT_AMBULATORY_CARE_PROVIDER_SITE_OTHER): Payer: Medicaid Other

## 2021-10-05 VITALS — BP 133/87 | HR 97 | Temp 98.9°F | Resp 16 | Ht 66.0 in | Wt 205.0 lb

## 2021-10-05 DIAGNOSIS — D509 Iron deficiency anemia, unspecified: Secondary | ICD-10-CM

## 2021-10-05 MED ORDER — SODIUM CHLORIDE 0.9 % IV SOLN
510.0000 mg | Freq: Once | INTRAVENOUS | Status: AC
  Administered 2021-10-05: 510 mg via INTRAVENOUS
  Filled 2021-10-05: qty 17

## 2021-10-05 MED ORDER — FAMOTIDINE IN NACL 20-0.9 MG/50ML-% IV SOLN: 20.0000 mg | Freq: Once | INTRAVENOUS | Status: DC | PRN

## 2021-10-05 MED ORDER — DIPHENHYDRAMINE HCL 50 MG/ML IJ SOLN: 50.0000 mg | Freq: Once | INTRAMUSCULAR | Status: DC | PRN

## 2021-10-05 MED ORDER — SODIUM CHLORIDE 0.9 % IV SOLN: Freq: Once | INTRAVENOUS | Status: DC | PRN

## 2021-10-05 MED ORDER — EPINEPHRINE 0.3 MG/0.3ML IJ SOAJ: 0.3000 mg | Freq: Once | INTRAMUSCULAR | Status: DC | PRN

## 2021-10-05 MED ORDER — METHYLPREDNISOLONE SODIUM SUCC 125 MG IJ SOLR: 125.0000 mg | Freq: Once | INTRAMUSCULAR | Status: DC | PRN

## 2021-10-05 MED ORDER — ALBUTEROL SULFATE HFA 108 (90 BASE) MCG/ACT IN AERS: 2.0000 | INHALATION_SPRAY | Freq: Once | RESPIRATORY_TRACT | Status: DC | PRN

## 2021-10-05 NOTE — Progress Notes (Signed)
Diagnosis: Iron Deficiency Anemia  Provider:  Chilton Greathouse, MD  Procedure: Infusion  IV Type: Peripheral, IV Location: R Hand  Feraheme (Ferumoxytol), Dose: 510 mg  Infusion Start Time: 0942  Infusion Stop Time: 1000  Post Infusion IV Care: Observation period completed and Peripheral IV Discontinued  Discharge: Condition: Good, Destination: Home . AVS provided to patient.   Performed by:  Dewanda Fennema, Lyman Speller, LPN

## 2021-10-10 ENCOUNTER — Emergency Department (HOSPITAL_COMMUNITY)
Admission: EM | Admit: 2021-10-10 | Discharge: 2021-10-10 | Disposition: A | Discharge status: Home / Self Care | Payer: Medicaid Other | Attending: Emergency Medicine | Admitting: Emergency Medicine

## 2021-10-10 ENCOUNTER — Encounter (HOSPITAL_COMMUNITY): Payer: Self-pay

## 2021-10-10 ENCOUNTER — Emergency Department (HOSPITAL_COMMUNITY): Payer: Medicaid Other

## 2021-10-10 ENCOUNTER — Other Ambulatory Visit: Payer: Self-pay

## 2021-10-10 DIAGNOSIS — F17210 Nicotine dependence, cigarettes, uncomplicated: Secondary | ICD-10-CM | POA: Insufficient documentation

## 2021-10-10 DIAGNOSIS — Z7982 Long term (current) use of aspirin: Secondary | ICD-10-CM | POA: Insufficient documentation

## 2021-10-10 DIAGNOSIS — R519 Headache, unspecified: Secondary | ICD-10-CM | POA: Diagnosis not present

## 2021-10-10 DIAGNOSIS — R410 Disorientation, unspecified: Secondary | ICD-10-CM | POA: Diagnosis not present

## 2021-10-10 DIAGNOSIS — I509 Heart failure, unspecified: Secondary | ICD-10-CM | POA: Diagnosis not present

## 2021-10-10 DIAGNOSIS — R569 Unspecified convulsions: Secondary | ICD-10-CM

## 2021-10-10 NOTE — ED Triage Notes (Incomplete)
Per EMS- Patient and her partner report that the patient has had "blank stare episodes and shivering since IUD p[laced several months ago." Patient alert and oriented x 4 during episode.

## 2021-10-10 NOTE — ED Provider Notes (Signed)
[facility]-EMERGENCY DEPT Provider Note   CSN: [id number] Arrival date & time: 10/10/21  0915     History No chief complaint on file.   Tabitha Smith is a 30 y.o. female.  30 year old female who presents after having episode of where she had possible seizure activity.  Has had 2 or 3 of these recently.  Patient's friend describes that patient becomes unresponsive and is then confused afterwards.  No reported tonic-clonic activity.  No loss of bowel or bladder function.  No tongue biting appreciated.  No prior history of seizures.  Does have a history of migraines and has been out of her medications.  Does have a mild headache at this time but denies any fever or neck pain      Past Medical History:  Diagnosis Date   Adnexal mass    Anxiety    CHF (congestive heart failure) (HCC)    Depression    History of acute heart failure    Hx of septic shock    IBS (irritable bowel syndrome)    Pneumonia    PONV (postoperative nausea and vomiting)    Sinusitis    Stroke Procedure [facility])     Patient Active Problem List   Diagnosis Date Noted   Iron deficiency anemia 09/27/2021   Fatigue 06/16/2021   Adnexal mass 05/24/2021   Abnormal uterine bleeding 05/24/2021   History of stroke 05/24/2021   Secondary cardiomyopathy (HCC)    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   Sepsis (HCC) 09/03/2020   Septic shock (HCC) 09/02/2020   Depression 09/02/2020   History of suicidal ideation 09/02/2020   Aspiration pneumonia of left lower lobe (HCC)    Demand ischemia of myocardium (HCC)    Chronic migraine without aura, with intractable migraine, so stated, with status migrainosus 08/06/2018    Past Surgical History:  Procedure Laterality Date   BUBBLE STUDY  09/06/2020   Procedure: BUBBLE STUDY;  Surgeon: Pricilla Riffle, MD;  Location: [facility] ENDOSCOPY;  Service: Cardiovascular;;   DILATION AND CURETTAGE OF UTERUS N/A 06/14/2021   Procedure: DILATATION AND  CURETTAGE;  Surgeon: Carver Fila, MD;  Location: [facility] ORS;  Service: Gynecology;  Laterality: N/A;   INTRAUTERINE DEVICE (IUD) INSERTION N/A 06/14/2021   Procedure: MERINA INTRAUTERINE DEVICE (IUD) INSERTION;  Surgeon: Carver Fila, MD;  Location: [facility] ORS;  Service: Gynecology;  Laterality: N/A;   NO PAST SURGERIES     stitches to abdomen      for a cut    TEE WITHOUT CARDIOVERSION N/A 09/06/2020   Procedure: TRANSESOPHAGEAL ECHOCARDIOGRAM (TEE);  Surgeon: Pricilla Riffle, MD;  Location: [facility] ENDOSCOPY;  Service: Cardiovascular;  Laterality: N/A;     OB History     Gravida  2   Para  1   Term      Preterm      AB  1   Living  1      SAB      IAB      Ectopic      Multiple      Live Births           Obstetric Comments  SVD in 2017         Family History  Problem Relation Age of Onset   Diabetes Mother    Seizures Mother    Cancer Mother        Colon   Hypertension Mother    Stroke Mother    Migraines Neg Hx  Headache Neg Hx    Breast cancer Neg Hx    Ovarian cancer Neg Hx    Endometrial cancer Neg Hx    Pancreatic cancer Neg Hx    Prostate cancer Neg Hx     Social History   Tobacco Use   Smoking status: Every Day    Packs/day: 0.50    Types: Cigarettes   Smokeless tobacco: Never  Vaping Use   Vaping Use: Never used  Substance Use Topics   Alcohol use: Yes    Comment: occasionally   Drug use: Never    Home Medications Prior to Admission medications   Medication Sig Start Date End Date Taking? Authorizing Provider  aspirin EC 81 MG tablet Take 81 mg by mouth daily. Swallow whole.    [provider]  DULoxetine (CYMBALTA) 60 MG capsule Take 120 mg by mouth daily. 05/02/21   [provider]  Erenumab-aooe (AIMOVIG) 70 MG/ML SOAJ Inject 70 mg into the skin every 14 (fourteen) days. 06/14/20   Anson Fret, MD  ferrous sulfate 325 (65 FE) MG EC tablet Take 1 tablet (325 mg total) by mouth daily with breakfast.  06/21/21   Briant Cedar, PA-C  linaclotide (LINZESS) 290 MCG CAPS capsule Take 290 mcg by mouth daily as needed (constipation).    [provider]  prazosin (MINIPRESS) 1 MG capsule Take 1 mg by mouth 2 (two) times daily. Pt works 3rd shift so takes med at [employer] and at bedtime. 05/02/21   [provider]  QUEtiapine Fumarate (SEROQUEL XR) 150 MG 24 hr tablet Take 150 mg by mouth at bedtime. 05/20/21   [provider]  zolpidem (AMBIEN) 10 MG tablet Take 10 mg by mouth at bedtime as needed for sleep.    [provider]    Allergies    Amoxicillin and Penicillins  Review of Systems   Review of Systems  All other systems reviewed and are negative.  Physical Exam Updated Vital Signs BP (!) 130/91 (BP Location: Left Arm)   Pulse 91   Temp 98.8 F (37.1 C) (Oral)   Resp 14   Ht 1.676 m (5\' 6" )   Wt 93 kg   SpO2 100%   BMI 33.09 kg/m   Physical Exam Vitals and nursing note reviewed.  Constitutional:      General: She is not in acute distress.    Appearance: Normal appearance. She is well-developed. She is not toxic-appearing.  HENT:     Head: Normocephalic and atraumatic.  Eyes:     General: Lids are normal.     Conjunctiva/sclera: Conjunctivae normal.     Pupils: Pupils are equal, round, and reactive to light.  Neck:     Thyroid: No thyroid mass.     Trachea: No tracheal deviation.  Cardiovascular:     Rate and Rhythm: Normal rate and regular rhythm.     Heart sounds: Normal heart sounds. No murmur heard.   No gallop.  Pulmonary:     Effort: Pulmonary effort is normal. No respiratory distress.     Breath sounds: Normal breath sounds. No stridor. No decreased breath sounds, wheezing, rhonchi or rales.  Abdominal:     General: There is no distension.     Palpations: Abdomen is soft.     Tenderness: There is no abdominal tenderness. There is no rebound.  Musculoskeletal:        General: No tenderness. Normal range of motion.     Cervical  back: Normal range of  motion and neck supple.  Skin:    General: Skin is warm and dry.     Findings: No abrasion or rash.  Neurological:     Mental Status: She is alert and oriented to person, place, and time. Mental status is at baseline.     GCS: GCS eye subscore is 4. GCS verbal subscore is 5. GCS motor subscore is 6.     Cranial Nerves: No cranial nerve deficit.     Sensory: No sensory deficit.     Motor: Motor function is intact.  Psychiatric:        Attention and Perception: Attention normal.        Speech: Speech normal.        Behavior: Behavior normal.    ED Results / Procedures / Treatments   Labs (all labs ordered are listed, but only abnormal results are displayed) Labs Reviewed - No data to display  EKG None  Radiology No results found.  Procedures Procedures   Medications Ordered in ED Medications - No data to display  ED Course  I have reviewed the triage vital signs and the nursing notes.  Pertinent labs & imaging results that were available during my care of the patient were reviewed by me and considered in my medical decision making (see chart for details).    MDM Rules/Calculators/A&P                           Head CT without acute findings here.  Will give referral to neurology Final Clinical Impression(s) / ED Diagnoses Final diagnoses:  None    Rx / DC Orders ED Discharge Orders     None        Lorre Nick, MD 10/10/21 1135

## 2021-10-11 ENCOUNTER — Other Ambulatory Visit: Payer: Self-pay | Admitting: Neurology

## 2021-10-11 ENCOUNTER — Telehealth: Payer: Self-pay | Admitting: Adult Health

## 2021-10-11 MED ORDER — AIMOVIG 70 MG/ML ~~LOC~~ SOAJ
70.0000 mg | SUBCUTANEOUS | 6 refills | Status: DC
Start: 2021-10-11 — End: 2023-01-16

## 2021-10-11 NOTE — Telephone Encounter (Signed)
I was able to get the pt scheduled sooner on 10/26/21 at 9 AM.

## 2021-10-11 NOTE — Telephone Encounter (Signed)
Called the patient back she has been out of the [place] since early November. She states that the script was never sent in. She is taking the 70 mg every 2 weeks which is what she has been on for a while now. I have forwarded the refill request to the pharmacy on file for the patient.    Pt is also calling because with her going to ER with seizure like activity they are referring her to be evaluated for that. Given we have not seen the patient for seizures, the patient needs to schedule this visit with Dr Lucia Gaskins. I have placed her on the schedule for an opening that was available 11/02/2021 at 8:30 am. Will send to Dr Trevor Mace group to verify if any changes need to be made

## 2021-10-11 NOTE — Telephone Encounter (Signed)
Called the patient back she has been out of the aimovig since early November. She states that the script was never sent in. She is taking the 70 mg every 2 weeks which is what she has been on for a while now. I have forwarded the refill request to the pharmacy on file for the patient.  °  °Pt is also calling because with her going to ER with seizure like activity they are referring her to be evaluated for that. Given we have not seen the patient for seizures, the patient needs to schedule this visit with Dr Ahern. I have placed her on the schedule for an opening that was available 11/02/2021 at 8:30 am. Will send to Dr Ahern's group to verify if any changes need to be made °

## 2021-10-11 NOTE — Telephone Encounter (Signed)
Got pt scheduled sooner on 10/26/21 at 9 AM.

## 2021-10-11 NOTE — Telephone Encounter (Signed)
Pt states she was in the hospital for possible seizure activity but that is because of how bad her migraines are and her only getting a 14 day amount of :Erenumab-aooe (AIMOVIG) 70 MG/ML SOAJ Pt would like a call to discuss a full month Rx for the medication

## 2021-10-12 ENCOUNTER — Other Ambulatory Visit: Payer: Self-pay

## 2021-10-12 ENCOUNTER — Ambulatory Visit (INDEPENDENT_AMBULATORY_CARE_PROVIDER_SITE_OTHER): Payer: Medicaid Other

## 2021-10-12 VITALS — BP 132/85 | HR 82 | Temp 99.0°F | Resp 18 | Ht 66.0 in | Wt 201.0 lb

## 2021-10-12 DIAGNOSIS — D509 Iron deficiency anemia, unspecified: Secondary | ICD-10-CM

## 2021-10-12 MED ORDER — EPINEPHRINE 0.3 MG/0.3ML IJ SOAJ: 0.3000 mg | Freq: Once | INTRAMUSCULAR | Status: DC | PRN

## 2021-10-12 MED ORDER — SODIUM CHLORIDE 0.9 % IV SOLN
510.0000 mg | Freq: Once | INTRAVENOUS | Status: AC
  Administered 2021-10-12: 11:00:00 510 mg via INTRAVENOUS
  Filled 2021-10-12: qty 17

## 2021-10-12 MED ORDER — METHYLPREDNISOLONE SODIUM SUCC 125 MG IJ SOLR: 125.0000 mg | Freq: Once | INTRAMUSCULAR | Status: DC | PRN

## 2021-10-12 MED ORDER — DIPHENHYDRAMINE HCL 50 MG/ML IJ SOLN: 50.0000 mg | Freq: Once | INTRAMUSCULAR | Status: DC | PRN

## 2021-10-12 MED ORDER — SODIUM CHLORIDE 0.9 % IV SOLN: Freq: Once | INTRAVENOUS | Status: DC | PRN

## 2021-10-12 MED ORDER — FAMOTIDINE IN NACL 20-0.9 MG/50ML-% IV SOLN: 20.0000 mg | Freq: Once | INTRAVENOUS | Status: DC | PRN

## 2021-10-12 MED ORDER — ALBUTEROL SULFATE HFA 108 (90 BASE) MCG/ACT IN AERS: 2.0000 | INHALATION_SPRAY | Freq: Once | RESPIRATORY_TRACT | Status: DC | PRN

## 2021-10-12 NOTE — Progress Notes (Signed)
Diagnosis: Iron Deficiency Anemia  Provider:  Chilton Greathouse, MD  Procedure: Infusion  IV Type: Peripheral, IV Location: L Antecubital  Feraheme (Ferumoxytol), Dose: 510 mg  Infusion Start Time: 1045  Infusion Stop Time: 1103  Post Infusion IV Care: Peripheral IV Discontinued  Discharge: Condition: Good, Destination: Home . AVS provided to patient.   Performed by:  Marguarite Arbour, RN

## 2021-10-26 ENCOUNTER — Telehealth: Payer: Self-pay | Admitting: *Deleted

## 2021-10-26 ENCOUNTER — Other Ambulatory Visit: Payer: Self-pay

## 2021-10-26 ENCOUNTER — Encounter: Payer: Self-pay | Admitting: Neurology

## 2021-10-26 ENCOUNTER — Ambulatory Visit: Payer: Medicaid Other | Admitting: Neurology

## 2021-10-26 VITALS — BP 126/80 | HR 81 | Ht 66.0 in | Wt 200.6 lb

## 2021-10-26 DIAGNOSIS — G43009 Migraine without aura, not intractable, without status migrainosus: Secondary | ICD-10-CM

## 2021-10-26 DIAGNOSIS — R5383 Other fatigue: Secondary | ICD-10-CM | POA: Diagnosis not present

## 2021-10-26 DIAGNOSIS — R569 Unspecified convulsions: Secondary | ICD-10-CM

## 2021-10-26 DIAGNOSIS — R419 Unspecified symptoms and signs involving cognitive functions and awareness: Secondary | ICD-10-CM | POA: Diagnosis not present

## 2021-10-26 HISTORY — DX: Migraine without aura, not intractable, without status migrainosus: G43.009

## 2021-10-26 MED ORDER — UBRELVY 100 MG PO TABS: 100.0000 mg | ORAL_TABLET | ORAL | 11 refills | Status: DC | PRN

## 2021-10-26 NOTE — Patient Instructions (Addendum)
MRI brain We will call you for 72-hour EEG at home Bloodwork Continue Aimovig, try Bernita Raisin   Per [facility] statutes, patients with seizures are not allowed to drive until they have been seizure-free for six months.    Use caution when using heavy equipment or power tools. Avoid working on ladders or at heights. Take showers instead of baths. Ensure the water temperature is not too high on the home water heater. Do not go swimming alone. Do not lock yourself in a room alone (i.e. bathroom). When caring for infants or small children, sit down when holding, feeding, or changing them to minimize risk of injury to the child in the event you have a seizure. Maintain good sleep hygiene. Avoid alcohol.    If patient has another seizure, call 911 and bring them back to the ED if: A.  The seizure lasts longer than 5 minutes.      B.  The patient doesn't wake shortly after the seizure or has new problems such as difficulty seeing, speaking or moving following the seizure C.  The patient was injured during the seizure D.  The patient has a temperature over 102 F (39C) E.  The patient vomited during the seizure and now is having trouble breathing  Per [facility] statutes, patients with seizures are not allowed to drive until they have been seizure-free for six months.  Other recommendations include using caution when using heavy equipment or power tools. Avoid working on ladders or at heights. Take showers instead of baths.  Do not swim alone.  Ensure the water temperature is not too high on the home water heater. Do not go swimming alone. Do not lock yourself in a room alone (i.e. bathroom). When caring for infants or small children, sit down when holding, feeding, or changing them to minimize risk of injury to the child in the event you have a seizure. Maintain good sleep hygiene. Avoid alcohol.  Also recommend adequate sleep, hydration, good diet and minimize stress.  During the  Seizure  - First, ensure adequate ventilation and place patients on the floor on their left side  Loosen clothing around the neck and ensure the airway is patent. If the patient is clenching the teeth, do not force the mouth open with any object as this can cause severe damage - Remove all items from the surrounding that can be hazardous. The patient may be oblivious to what's happening and may not even know what he or she is doing. If the patient is confused and wandering, either gently guide him/her away and block access to outside areas - Reassure the individual and be comforting - Call 911. In most cases, the seizure ends before EMS arrives. However, there are cases when seizures may last over 3 to 5 minutes. Or the individual may have developed breathing difficulties or severe injuries. If a pregnant patient or a person with diabetes develops a seizure, it is prudent to call an ambulance. - Finally, if the patient does not regain full consciousness, then call EMS. Most patients will remain confused for about 45 to 90 minutes after a seizure, so you must use judgment in calling for help. - Avoid restraints but make sure the patient is in a bed with padded side rails - Place the individual in a lateral position with the neck slightly flexed; this will help the saliva drain from the mouth and prevent the tongue from falling backward - Remove all nearby furniture and other hazards from  the area - Provide verbal assurance as the individual is regaining consciousness - Provide the patient with privacy if possible - Call for help and start treatment as ordered by the caregiver   fter the Seizure (Postictal Stage)  After a seizure, most patients experience confusion, fatigue, muscle pain and/or a headache. Thus, one should permit the individual to sleep. For the next few days, reassurance is essential. Being calm and helping reorient the person is also of importance.  Most seizures are painless and  end spontaneously. Seizures are not harmful to others but can lead to complications such as stress on the lungs, brain and the heart. Individuals with prior lung problems may develop labored breathing and respiratory distress.    Ubrogepant tablets What is this medication? UBROGEPANT (ue BROE je pant) is used to treat migraine headaches with or without aura. An aura is a strange feeling or visual disturbance that warns you of an attack. It is not used to prevent migraines. This medicine may be used for other purposes; ask your health care provider or pharmacist if you have questions. COMMON BRAND NAME(S): [name] What should I tell my care team before I take this medication? They need to know if you have any of these conditions: kidney disease liver disease an unusual or allergic reaction to ubrogepant, other medicines, foods, dyes, or preservatives pregnant or trying to get pregnant breast-feeding How should I use this medication? Take this medicine by mouth with a glass of water. Follow the directions on the prescription label. You can take it with or without food. If it upsets your stomach, take it with food. Take your medicine at regular intervals. Do not take it more often than directed. Do not stop taking except on your doctor's advice. Talk to your pediatrician about the use of this medicine in children. Special care may be needed. Overdosage: If you think you have taken too much of this medicine contact a poison control center or emergency room at once. NOTE: This medicine is only for you. Do not share this medicine with others. What if I miss a dose? This does not apply. This medicine is not for regular use. What may interact with this medication? Do not take this medicine with any of the following medicines: ceritinib certain antibiotics like chloramphenicol, clarithromycin, telithromycin certain antivirals for HIV like atazanavir, cobicistat, darunavir, delavirdine,  fosamprenavir, indinavir, ritonavir certain medicines for fungal infections like itraconazole, ketoconazole, posaconazole, voriconazole conivaptan grapefruit idelalisib mifepristone nefazodone ribociclib This medicine may also interact with the following medications: carvedilol certain medicines for seizures like phenobarbital, phenytoin ciprofloxacin cyclosporine eltrombopag fluconazole fluvoxamine quinidine rifampin [facility]. John's wort verapamil This list may not describe all possible interactions. Give your health care provider a list of all the medicines, herbs, non-prescription drugs, or dietary supplements you use. Also tell them if you smoke, drink alcohol, or use illegal drugs. Some items may interact with your medicine. What should I watch for while using this medication? Visit your health care professional for regular checks on your progress. Tell your health care professional if your symptoms do not start to get better or if they get worse. Your mouth may get dry. Chewing sugarless gum or sucking hard candy and drinking plenty of water may help. Contact your health care professional if the problem does not go away or is severe. What side effects may I notice from receiving this medication? Side effects that you should report to your doctor or health care professional as soon as possible: allergic reactions  like skin rash, itching or hives; swelling of the face, lips, or tongue Side effects that usually do not require medical attention (report these to your doctor or health care professional if they continue or are bothersome): drowsiness dry mouth nausea tiredness This list may not describe all possible side effects. Call your doctor for medical advice about side effects. You may report side effects to FDA at 1-800-FDA-1088. Where should I keep my medication? Keep out of the reach of children. Store at room temperature between 15 and 30 degrees C (59 and 86 degrees F). Throw  away any unused medicine after the expiration date. NOTE: This sheet is a summary. It may not cover all possible information. If you have questions about this medicine, talk to your doctor, pharmacist, or health care provider.  2022 [employer]vier/[employer] Standard (2019-01-02 00:00:00)

## 2021-10-26 NOTE — Telephone Encounter (Signed)
Received fax confirmation received.

## 2021-10-26 NOTE — Telephone Encounter (Signed)
Shelise d [place] Key: [id number] - PA Case ID: [id number] - Rx #: [id number]  PA for Bernita Raisin has been sent this afternoon waiting on approval

## 2021-10-26 NOTE — Progress Notes (Addendum)
[name]    Provider:  Dr Jaynee Eagles Referring Provider: [name], NP Primary Care Physician:  [facility]  CC:  Migraines, stroke, now new seizure activity  Interval history 10/26/2021: Since I last saw patient 05/2020,  lots has transpired. Patient was on [place] and doing well in 12/2020; her baseline was 23 headache days and 15 migraines but on aimovig she reduced to 1-2 migraines. In 08/2020 she was hospitalized in the setting of septic shock with acute combined systolic diastolic heart failure and possible seizure activity, aspiration pneumonia and acute CVA, in the setting of a root canal 2 days prior, symptoms seem to improve with Narcan, echo showed reduced ejection fraction and global hypokinesis but TEE showed no evidence of embolic source and normal left ventricular systolic function, Zio patch was ordered by cardiology which was normal, follow-up TEE and echo showed heart function had returned to normal.  Since triptans were now contraindicated she was started on Nurtec, she was encouraged to follow-up with cardiology and continue ASA for stroke prevention.  She followed up with [name] her stroke nurse in July 2022, at that time she had been stable from a stroke standpoint without new or recurring stroke TIA symptoms, she did have follow-up with cardiology and repeat echo in November 2021 showing normal EF, she completed a cardiac monitor which was negative.  At the time of admission for stroke she was found unresponsive with tongue biting.  She was also found to have protein S deficiency and she has been to see hematology for Hypercoagulable workup was positive for mild elevation of anticardiolipin Igm at 19 and decreased protein S activity at 37%.05/26/2021: Repeat cardiolipin antibody panel revealed mild increase in anticardiolipin IgM at 28.  She last saw hematology oncology in November of this year and continue to report migraines, chronic  headaches, dizziness, presyncopal events.  She reported being on Cymbalta, Aimovig, Ambien some of her medications.  She did not meet criteria for antiphospholipid antibody syndrome and no further intervention was required for elevated anticardiolipin IgM level, repeat protein S level activity was checked in August 2022 and was within normal limits no further intervention required, she was diagnosed with iron deficiency anemia likely causing her fatigue and IV infusions were requested.  She was also referred to GI, I encouraged her to call [name] because she has not heard back.   She was recently seen in the emergency room earlier this month after having an episode where she had possible seizure activity, 2 or 3 of these, friend described unresponsiveness and then confused afterwards, no reported tonic-clonic activity, no loss of bowel or bladder, no tongue biting, no prior history of seizures except the unresponsiveness prior to her admission for stroke and acute heart failure, CT of the head showed no acute abnormality. Here with her friend, her whole body tenses up, shaking slightly, incoherent, lasted 30-45 seconds, then opened eyes and no postictal confusion, when awake she zones out and doesn't remember the conversation, it is happening every few days, discussed no driving. She had a clean bill of health, had a monitor, cardiac wise everything was normal again. She is here with her friend who has witnessed her events. She had iron infusion treatments. She just started back on the aimovig which is extremely helpful for migraines.   EEG 09/03/2020: IMPRESSION: personally reviewed  This study is within normal limits. No seizures or epileptiform discharges were seen throughout the recording.  Personally reviewed imaging and agree with the  following: CT head 10/10/2021: IMPRESSION: 1. No evidence of acute intracranial abnormality. 2. Partially visualized left maxillary sinus fluid and  mucosal thickening. Correlate for acute sinusitis.  Triptans contraindicated due ot stroke. Try [name].she has failed multiple triptans including imitrexPO an dinjection, rizatriptan, zolmitriptanPO and nasal, eletriptan, alleve, tylenol, excedrin, naproxen, benadryl, robaxin,flexeril,methyprednisolone, zofran, tramadol, nurtec, seroquel, phenergan,compazine, oxy, topiramate, amitriptyline, reglan, naproxen, antivert, Propranolol, Nortriptyline, Fioricet, she has tried "everything", Zomig nasal, maxalt,aimovig,ajovy,emgality  Patient complains of symptoms per HPI as well as the following symptoms: fatigue . Pertinent negatives and positives per HPI. All others negative  Interval history June 14, 2020: Patient was approved for Ajovy last appointment, from a review of records she was seen in the emergency room April 10, 2020 with right shoulder pain for 2 weeks with onset of migraine the day before, migraine began the day before and unrelieved by previous migraine medications, right-sided, pulsatile with light sensitivity, she denied any recent fever, dizziness, chest pain shortness of breath, abdominal pain or nausea vomiting, she has multiple medications that can be used for her migraines on her med list including Zofran, Nurtec, Imitrex, Ajovy,  She was given a Toradol injection.  She was given Robaxin as needed, exam consistent with muscle strain, also recommended she use heating pad, migraine likely triggered by this issue and Toradol IM helped and she was discharged with Robaxin.  Ajovy didn't help. Aimovig worked great but was wearing off by the end of the month We will prescriber her 70mg  twice daily. She did great on the Aimovig. Will try and get Nurtec approved she has failed multiple triptans including imitrex, rizatriptan, zolmitriptan, eletriptan. We discussed teratogenicity of medications, will check urine pregnancy test, asked her to always use condoms and backup. Discussed at length. Also  discussed other options, botox, oral medications, acute and preventative measures.  Interval history 12/16/2019:  Patient here for follow up, she ran out of [place] and she couldn't get it. When she was on the aimovig she was doing great initially but Aimovig started wearing off and she would also have recurrence of migraines at the end of the month, but she hadn't taken it in several months, she is not planning on children. The migaines are back, we will restart but change to Ajovy due to wearing off of Aimovig.  As far as acute medications she tried imitrex, zomig nasal and maxalt.  We did have a long talk today, we will try to get Ajovy approved, she has tried multiple triptans we discussed other options and we decided on Nurtec.  I did give her some samples of Nurtec as well as Ajovy to get her through until we can get it approved.  Interval history; Patient doing exceptionally well on Aimovig. She has had only a few migraines since starting and one was when she was last for the Aimovig. Her baseline is 23 headache days a month.  15 migraine days a month. MRi brain normal.  She has drainage and chronic sinusitis. Reviewed images with patient, she has drainage and sinus symptoms, MRI showed: "mucoperiosteal thickening and some mucous retention cysts within the left maxillary sinus and large mucous retention cyst within the right maxillary sinus."  HPI:  Tabitha Smith is a 30 y.o. female here as requested by Dr. Coralyn Mark for migraines. She has had them since a child. She missed lots of days of school in HS. Migraines/headaches worsening since having son 2 years ago. Migraines can be unilateral , eye pain, +photo/phonophobia, +nausea and vomiting, has to  go into a dark room which helps, can't have any noise. Marland Kitchen Anxiety and stress makes it worse. 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 She also has intermittent dizziness with and without the headache. She feels like she just got off an  elevator. When she lays down she gets a rush of dizziness and headache, positionally worse. She wakes in the morning with headaches, they have woken her up in the middle of the night. She is having numbness on the left side of her face. Not having anymore children. Not sexually active and uses birth control. No aura. No medication overuse.    Medications tried: Sumatriptan, Topiramate, Propranolol, Nortriptyline, Fioricet, she has tried "everything", Zomig nasal, maxalt,Triptans contraindicated due ot stroke. Try [name].she has failed multiple triptans including imitrexPO an dinjection, rizatriptan, zolmitriptanPO and nasal, eletriptan, alleve, tylenol, excedrin, naproxen, benadryl, robaxin,flexeril,methyprednisolone, zofran, tramadol, nurtec, seroquel, phenergan,compazine, oxy, topiramate, amitriptyline, reglan, naproxen, antivert, Propranolol, Nortriptyline, Fioricet, she has tried "everything", Zomig nasal, maxalt,aimovig,ajovy,emgality  Reviewed notes, labs and imaging from outside physicians, which showed:  Reviewed notes from referring physician Dr. Blenda Nicely.  Patient was seen in ear nose and throat for the evaluation of dizziness.  The problem intermittent.  Worse with moving around and last a few seconds.  Started 3 weeks prior to visit which was July 22, 2018.  Feels like she just got off an elevator, no hearing loss, no vertigo or room spinning, no voice changes she does report tingling in the hands and headaches with pain in the sides of the heads, with headaches there is photophobia and phonophobia, she is has history of migraines in the past.  Antinausea medications did not help.  Reviewed exam and work-up which was unremarkable.  Patient was scheduled for Dix-Hallpike with lenses and audiometric testing, and referred to neurology suspecting likely migraine disorder.   Review of Systems: Patient complains of symptoms per HPI as well as the following symptoms:shoulder pain, migraines .  Pertinent negatives and positives per HPI. All others negative    Social History   Socioeconomic History   Marital status: Single    Spouse name: Not on file   Number of children: 1   Years of education: Not on file   Highest education level: High school graduate  Occupational History   Not on file  Tobacco Use   Smoking status: Every Day    Packs/day: 0.50    Types: Cigarettes   Smokeless tobacco: Never  Vaping Use   Vaping Use: Never used  Substance and Sexual Activity   Alcohol use: Yes    Comment: occasionally   Drug use: Never   Sexual activity: Yes    Birth control/protection: I.U.D.  Other Topics Concern   Not on file  Social History Narrative   Lives at home with family   Left handed   Caffeine: coffee, 2-4 cups/day   Social Determinants of Health   Financial Resource Strain: Not on file  Food Insecurity: Not on file  Transportation Needs: Not on file  Physical Activity: Not on file  Stress: Not on file  Social Connections: Not on file  Intimate Partner Violence: Not on file    Family History  Problem Relation Age of Onset   Diabetes Mother    Seizures Mother    Cancer Mother        Colon   Hypertension Mother    Stroke Mother    Migraines Neg Hx    Headache Neg Hx  Breast cancer Neg Hx    Ovarian cancer Neg Hx    Endometrial cancer Neg Hx    Pancreatic cancer Neg Hx    Prostate cancer Neg Hx     Past Medical History:  Diagnosis Date   Adnexal mass    Anxiety    CHF (congestive heart failure) (HCC)    Depression    History of acute heart failure    Hx of septic shock    IBS (irritable bowel syndrome)    Migraine without aura and without status migrainosus, not intractable 10/26/2021   Pneumonia    PONV (postoperative nausea and vomiting)    Seizures ([place])    Sinusitis    Stroke [facility])     Past Surgical History:  Procedure Laterality Date   BUBBLE STUDY  09/06/2020   Procedure: BUBBLE STUDY;  Surgeon: Fay Records, MD;   Location: [place];  Service: Cardiovascular;;   DILATION AND CURETTAGE OF UTERUS N/A 06/14/2021   Procedure: DILATATION AND CURETTAGE;  Surgeon: Lafonda Mosses, MD;  Location: [facility];  Service: Gynecology;  Laterality: N/A;   INTRAUTERINE DEVICE (IUD) INSERTION N/A 06/14/2021   Procedure: [place] (IUD) INSERTION;  Surgeon: Lafonda Mosses, MD;  Location: [facility];  Service: Gynecology;  Laterality: N/A;   NO PAST SURGERIES     stitches to abdomen      for a cut    TEE WITHOUT CARDIOVERSION N/A 09/06/2020   Procedure: TRANSESOPHAGEAL ECHOCARDIOGRAM (TEE);  Surgeon: Fay Records, MD;  Location: [facility];  Service: Cardiovascular;  Laterality: N/A;    Current Outpatient Medications  Medication Sig Dispense Refill   aspirin EC 81 MG tablet Take 81 mg by mouth daily. Swallow whole.     DULoxetine (CYMBALTA) 60 MG capsule Take 120 mg by mouth daily.     Erenumab-aooe (AIMOVIG) 70 MG/ML SOAJ Inject 70 mg into the skin every 14 (fourteen) days. 2 mL 6   ferrous sulfate 325 (65 FE) MG EC tablet Take 1 tablet (325 mg total) by mouth daily with breakfast. 30 tablet 3   linaclotide (LINZESS) 290 MCG CAPS capsule Take 290 mcg by mouth daily as needed (constipation).     prazosin (MINIPRESS) 1 MG capsule Take 1 mg by mouth 2 (two) times daily. Pt works 3rd shift so takes med at [employer] and at bedtime.     QUEtiapine Fumarate (SEROQUEL XR) 150 MG 24 hr tablet Take 150 mg by mouth at bedtime.     Ubrogepant (UBRELVY) 100 MG TABS Take 100 mg by mouth every 2 (two) hours as needed. Maximum 200mg  a day. 16 tablet 11   zolpidem (AMBIEN) 10 MG tablet Take 10 mg by mouth at bedtime as needed for sleep.     No current facility-administered medications for this visit.    Allergies as of 10/26/2021 - Review Complete 10/26/2021  Allergen Reaction Noted   Amoxicillin Anaphylaxis 09/10/2017   Penicillins Anaphylaxis 06/19/2016    Vitals: BP 126/80    Pulse 81    Ht 5\' 6"  (1.676 m)     Wt 200 lb 9.6 oz (91 kg)    BMI 32.38 kg/m  Last Weight:  Wt Readings from Last 1 Encounters:  10/26/21 200 lb 9.6 oz (91 kg)   Last Height:   Ht Readings from Last 1 Encounters:  10/26/21 5\' 6"  (1.676 m)   Physical exam: Exam: Gen: NAD, conversant, well nourised, well groomed  CV: RRR, no MRG. No Carotid Bruits. No peripheral edema, warm, nontender Eyes: Conjunctivae clear without exudates or hemorrhage  Neuro: Detailed Neurologic Exam  Speech:    Speech is normal; fluent and spontaneous with normal comprehension.  Cognition:    The patient is oriented to person, place, and time;     recent and remote memory intact;     language fluent;     normal attention, concentration,     fund of knowledge Cranial Nerves:    The pupils are equal, round, and reactive to light. The fundi are flat Visual fields are full to finger confrontation. Extraocular movements are intact. Trigeminal sensation is intact and the muscles of mastication are normal. The face is symmetric. The palate elevates in the midline. Hearing intact. Voice is normal. Shoulder shrug is normal. The tongue has normal motion without fasciculations.   Coordination:    Normal   Gait:     normal.   Motor Observation:    No asymmetry, no atrophy, and no involuntary movements noted. Tone:    Normal muscle tone.    Posture:    Posture is normal. normal erect    Strength:    Strength is V/V in the upper and lower limbs.      Sensation: intact to LT     Reflex Exam:  DTR's:    Deep tendon reflexes in the upper and lower extremities are normal bilaterally.   Toes:    The toes are downgoing bilaterally.   Clonus:    Clonus is absent.   Assessment/Plan:  30 year old with chronic intractable migraines and now alterations of awareness/ witnessed seizure-like activity. In 08/2020 she was hospitalized in the setting of septic shock with acute combined systolic diastolic heart failure and possible  seizure activity, aspiration pneumonia and acute CVA, in the setting of a root canal 2 days prior, symptoms seem to improve with Narcan, echo showed reduced ejection fraction and global hypokinesis but TEE showed no evidence of embolic source, follow-up TEE and echo showed heart function had returned to normal.  She followed up with [name] her stroke nurse in July 2022, at that time she had been stable from a stroke standpoint without new or recurring stroke TIA symptoms, she did have follow-up with cardiology and repeat echo in November 2021 showing normal EF, she completed a cardiac monitor which was negative.  She saw hematology   She did not meet criteria for antiphospholipid antibody syndrome and no further intervention was required for elevated anticardiolipin IgM level, repeat protein S level activity was checked in August 2022 and was within normal limits no further intervention required, she was diagnosed with iron deficiency anemia likely causing her fatigue and IV infusions were requested.  She was also referred to GI, pending  - She was recently seen in the emergency room earlier this month after having episodes where she had possible seizure activity, 2 or 3 of these, every few days friend described unresponsiveness, no reported tonic-clonic activity, no loss of bowel or bladder, no tongue biting, no prior history of seizures except the unresponsiveness prior to her admission for stroke and acute heart failure, CT of the head showed no acute abnormality. EEG 09/03/2020 in hospital negative. Need 96 hour ambulatory EEG to catch event that she has every "3-4 days", MRI brain  Orders Placed This Encounter  Procedures   MR BRAIN W WO CONTRAST   TSH   Basic Metabolic Panel   T4, Free     -  Doing great on Aimovig now has 4 migraines a month only and 4 headache days a month (Total of only 8 monthly headaches). Triptans contraindicated due ot stroke. Try [name].she has failed multiple  triptans including imitrexPO an dinjection, rizatriptan, zolmitriptanPO and nasal, eletriptan, alleve, tylenol, excedrin, naproxen, benadryl, robaxin,flexeril,methyprednisolone, zofran, tramadol, nurtec, seroquel, phenergan,compazine, oxy, topiramate, amitriptyline, reglan, naproxen, antivert, Propranolol, Nortriptyline, Fioricet, she has tried "everything", Zomig nasal, maxalt,  Meds ordered this encounter  Medications   Ubrogepant (UBRELVY) 100 MG TABS    Sig: Take 100 mg by mouth every 2 (two) hours as needed. Maximum 200mg  a day.    Dispense:  16 tablet    Refill:  11     - Ajovy did not work. Will try Aimovig 70mg  twice monthly.  Ajovy didn't help. Aimovig worked great but was wearing off by the end of the month We will prescriber her 70mg  twice monthly.   - discussed teratogenicity do not get pregnant.  - continue ASA 81mg  daily  Discussed: Per [facility] statutes, patients with seizures are not allowed to drive until they have been seizure-free for six months.    Use caution when using heavy equipment or power tools. Avoid working on ladders or at heights. Take showers instead of baths. Ensure the water temperature is not too high on the home water heater. Do not go swimming alone. Do not lock yourself in a room alone (i.e. bathroom). When caring for infants or small children, sit down when holding, feeding, or changing them to minimize risk of injury to the child in the event you have a seizure. Maintain good sleep hygiene. Avoid alcohol.    If patient has another seizure, call 911 and bring them back to the ED if: A.  The seizure lasts longer than 5 minutes.      B.  The patient doesn't wake shortly after the seizure or has new problems such as difficulty seeing, speaking or moving following the seizure C.  The patient was injured during the seizure D.  The patient has a temperature over 102 F (39C) E.  The patient vomited during the seizure and now is having trouble  breathing  Per [facility] statutes, patients with seizures are not allowed to drive until they have been seizure-free for six months.  Other recommendations include using caution when using heavy equipment or power tools. Avoid working on ladders or at heights. Take showers instead of baths.  Do not swim alone.  Ensure the water temperature is not too high on the home water heater. Do not go swimming alone. Do not lock yourself in a room alone (i.e. bathroom). When caring for infants or small children, sit down when holding, feeding, or changing them to minimize risk of injury to the child in the event you have a seizure. Maintain good sleep hygiene. Avoid alcohol.  Also recommend adequate sleep, hydration, good diet and minimize stress.  During the Seizure  - First, ensure adequate ventilation and place patients on the floor on their left side  Loosen clothing around the neck and ensure the airway is patent. If the patient is clenching the teeth, do not force the mouth open with any object as this can cause severe damage - Remove all items from the surrounding that can be hazardous. The patient may be oblivious to what's happening and may not even know what he or she is doing. If the patient is confused and wandering, either gently guide him/her away and block access  to outside areas - Reassure the individual and be comforting - Call 911. In most cases, the seizure ends before EMS arrives. However, there are cases when seizures may last over 3 to 5 minutes. Or the individual may have developed breathing difficulties or severe injuries. If a pregnant patient or a person with diabetes develops a seizure, it is prudent to call an ambulance. - Finally, if the patient does not regain full consciousness, then call EMS. Most patients will remain confused for about 45 to 90 minutes after a seizure, so you must use judgment in calling for help. - Avoid restraints but make sure the patient is in a bed  with padded side rails - Place the individual in a lateral position with the neck slightly flexed; this will help the saliva drain from the mouth and prevent the tongue from falling backward - Remove all nearby furniture and other hazards from the area - Provide verbal assurance as the individual is regaining consciousness - Provide the patient with privacy if possible - Call for help and start treatment as ordered by the caregiver   fter the Seizure (Postictal Stage)  After a seizure, most patients experience confusion, fatigue, muscle pain and/or a headache. Thus, one should permit the individual to sleep. For the next few days, reassurance is essential. Being calm and helping reorient the person is also of importance.  Most seizures are painless and end spontaneously. Seizures are not harmful to others but can lead to complications such as stress on the lungs, brain and the heart. Individuals with prior lung problems may develop labored breathing and respiratory distress.    Naomie Dean, MD  [facility] [address] [place], [place] [address]  Phone [phone] Fax [phone]

## 2021-10-26 NOTE — Telephone Encounter (Signed)
Order for 96hour VEEG (demogs, insurance, last ofv note, EEG 09-03-2020) faxed with confirmation to [facility] [phone].

## 2021-10-27 ENCOUNTER — Telehealth: Payer: Self-pay | Admitting: Neurology

## 2021-10-27 LAB — BASIC METABOLIC PANEL
BUN/Creatinine Ratio: 10 (ref 9–23)
BUN: 10 mg/dL (ref 6–20)
CO2: 26 mmol/L (ref 20–29)
Calcium: 9.5 mg/dL (ref 8.7–10.2)
Chloride: 101 mmol/L (ref 96–106)
Creatinine, Ser: 1.01 mg/dL — ABNORMAL HIGH (ref 0.57–1.00)
Glucose: 87 mg/dL (ref 70–99)
Potassium: 3.9 mmol/L (ref 3.5–5.2)
Sodium: 139 mmol/L (ref 134–144)
eGFR: 76 mL/min/{1.73_m2} (ref >59)

## 2021-10-27 LAB — TSH: TSH: 2.22 u[IU]/mL (ref 0.450–4.500)

## 2021-10-27 LAB — T4, FREE: Free T4: 1.28 ng/dL (ref 0.82–1.77)

## 2021-10-27 NOTE — Telephone Encounter (Signed)
I called [employer] [phone].  REF # [id number] Bernita Raisin 100mg  tablets #16/30days.  Approved until 10-27-2022.  (Pt has 8 headaches per month (4 migraines/ 4 headaches).

## 2021-10-27 NOTE — Telephone Encounter (Addendum)
Your request has been denied PA Case: [id number], Status: Denied. Notification: Completed. (No reason given).  Will call insurance to see why denied.

## 2021-10-27 NOTE — Telephone Encounter (Signed)
[facility] [employer] Berkley Harvey: [id number] (exp. 10/27/21 to 11/27/21) oirder sent to GI, they will reach out to the patient to schedule.l

## 2021-10-30 DIAGNOSIS — N939 Abnormal uterine and vaginal bleeding, unspecified: Secondary | ICD-10-CM

## 2021-10-30 HISTORY — DX: Abnormal uterine and vaginal bleeding, unspecified: N93.9

## 2021-11-01 ENCOUNTER — Other Ambulatory Visit: Payer: Self-pay | Admitting: Pharmacy Technician

## 2021-11-02 ENCOUNTER — Ambulatory Visit: Payer: Medicaid Other | Admitting: Neurology

## 2021-11-16 ENCOUNTER — Other Ambulatory Visit: Payer: Self-pay

## 2021-11-16 ENCOUNTER — Ambulatory Visit
Admission: RE | Admit: 2021-11-16 | Discharge: 2021-11-16 | Disposition: A | Discharge status: Home / Self Care | Payer: Medicaid Other | Source: Ambulatory Visit | Attending: Neurology | Admitting: Neurology

## 2021-11-16 DIAGNOSIS — R569 Unspecified convulsions: Secondary | ICD-10-CM | POA: Diagnosis not present

## 2021-11-16 DIAGNOSIS — R419 Unspecified symptoms and signs involving cognitive functions and awareness: Secondary | ICD-10-CM

## 2021-11-16 DIAGNOSIS — G43009 Migraine without aura, not intractable, without status migrainosus: Secondary | ICD-10-CM | POA: Diagnosis not present

## 2021-11-16 MED ORDER — GADOBENATE DIMEGLUMINE 529 MG/ML IV SOLN
19.0000 mL | Freq: Once | INTRAVENOUS | Status: AC | PRN
  Administered 2021-11-16: 19 mL via INTRAVENOUS

## 2021-11-22 ENCOUNTER — Other Ambulatory Visit: Payer: Self-pay | Admitting: Physician Assistant

## 2021-11-22 DIAGNOSIS — D509 Iron deficiency anemia, unspecified: Secondary | ICD-10-CM

## 2021-11-23 ENCOUNTER — Inpatient Hospital Stay: Payer: Medicaid Other | Attending: Gynecologic Oncology

## 2021-11-23 ENCOUNTER — Inpatient Hospital Stay: Payer: Medicaid Other | Admitting: Physician Assistant

## 2021-11-23 ENCOUNTER — Other Ambulatory Visit: Payer: Self-pay

## 2021-11-23 VITALS — BP 135/85 | HR 84 | Temp 98.1°F | Resp 17 | Wt 201.2 lb

## 2021-11-23 DIAGNOSIS — R768 Other specified abnormal immunological findings in serum: Secondary | ICD-10-CM | POA: Insufficient documentation

## 2021-11-23 DIAGNOSIS — D509 Iron deficiency anemia, unspecified: Secondary | ICD-10-CM | POA: Insufficient documentation

## 2021-11-23 DIAGNOSIS — Z87891 Personal history of nicotine dependence: Secondary | ICD-10-CM | POA: Diagnosis not present

## 2021-11-23 DIAGNOSIS — Z8673 Personal history of transient ischemic attack (TIA), and cerebral infarction without residual deficits: Secondary | ICD-10-CM | POA: Diagnosis not present

## 2021-11-23 LAB — CBC WITH DIFFERENTIAL (CANCER CENTER ONLY)
Abs Immature Granulocytes: 0.03 10*3/uL (ref 0.00–0.07)
Basophils Absolute: 0 10*3/uL (ref 0.0–0.1)
Basophils Relative: 0 %
Eosinophils Absolute: 0.2 10*3/uL (ref 0.0–0.5)
Eosinophils Relative: 2 %
HCT: 38.9 % (ref 36.0–46.0)
Hemoglobin: 13.3 g/dL (ref 12.0–15.0)
Immature Granulocytes: 0 %
Lymphocytes Relative: 29 %
Lymphs Abs: 2.5 10*3/uL (ref 0.7–4.0)
MCH: 30.4 pg (ref 26.0–34.0)
MCHC: 34.2 g/dL (ref 30.0–36.0)
MCV: 89 fL (ref 80.0–100.0)
Monocytes Absolute: 0.7 10*3/uL (ref 0.1–1.0)
Monocytes Relative: 8 %
Neutro Abs: 5.3 10*3/uL (ref 1.7–7.7)
Neutrophils Relative %: 61 %
Platelet Count: 228 10*3/uL (ref 150–400)
RBC: 4.37 MIL/uL (ref 3.87–5.11)
RDW: 14.2 % (ref 11.5–15.5)
WBC Count: 8.8 10*3/uL (ref 4.0–10.5)
nRBC: 0 % (ref 0.0–0.2)

## 2021-11-23 LAB — IRON AND IRON BINDING CAPACITY (CC-WL,HP ONLY)
Iron: 84 ug/dL (ref 28–170)
Saturation Ratios: 22 % (ref 10.4–31.8)
TIBC: 374 ug/dL (ref 250–450)
UIBC: 290 ug/dL

## 2021-11-24 LAB — FERRITIN: Ferritin: 431 ng/mL — ABNORMAL HIGH (ref 11–307)

## 2021-11-27 NOTE — Progress Notes (Signed)
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 Hypercoagulable workup was positive for mild elevation of anticardiolipin Igm at 19 and decreased protein S activity at 37%.  2) 05/26/2021: Repeat cardiolipin antibody panel revealed mild increase in anticardiolipin IgM at 28.  3) 06/16/2021: Establish care with [name] PA-C  4) 10/05/2021-10/12/2021: Received IV Feraheme 510 mg x 2 doses  CHIEF COMPLAINTS/PURPOSE OF CONSULTATION:  Iron deficiency anemia  HISTORY OF PRESENTING ILLNESS:  Tabitha Smith 31 y.o. female returns for follow-up for iron deficiency anemia.  She is unaccompanied for this visit.  On exam today, Tabitha Smith reports that following her IV iron infusions, her energy levels did improve but she has noticed it starting to decline the past 3 to 4 weeks.  She is able to complete all her daily activities on her own but does require frequent resting.  She denies any appetite changes or dietary restrictions.  She denies any nausea, vomiting or abdominal pain.  Her bowel habits are regular without any diarrhea or constipation.  Her menstrual bleeding is light due to IUD placement. She reports bruising on her legs with no known injury.   She reports intermittent episodes of dizziness but no syncopal episodes.  She denies any fevers, chills, night sweats, shortness of breath, chest pain, cough. She has no other complaints. Rest of the 10 point ROS is below.   MEDICAL HISTORY:  Past Medical History:  Diagnosis Date   Adnexal mass    Anxiety    CHF (congestive heart failure) ([place])    Depression     History of acute heart failure    Hx of septic shock    IBS (irritable bowel syndrome)    Migraine without aura and without status migrainosus, not intractable 10/26/2021   Pneumonia    PONV (postoperative nausea and vomiting)    Seizures ([place])    Sinusitis    Stroke [facility])     SURGICAL HISTORY: Past Surgical History:  Procedure Laterality Date   BUBBLE STUDY  09/06/2020   Procedure: BUBBLE STUDY;  Surgeon: Fay Records, MD;  Location: [place];  Service: Cardiovascular;;   DILATION AND CURETTAGE OF UTERUS N/A 06/14/2021   Procedure: DILATATION AND CURETTAGE;  Surgeon: Lafonda Mosses, MD;  Location: [facility];  Service: Gynecology;  Laterality: N/A;   INTRAUTERINE DEVICE (IUD) INSERTION N/A 06/14/2021   Procedure: [place] (IUD) INSERTION;  Surgeon: Lafonda Mosses, MD;  Location: [facility];  Service: Gynecology;  Laterality: N/A;   NO PAST SURGERIES     stitches to abdomen      for a cut    TEE WITHOUT CARDIOVERSION N/A 09/06/2020   Procedure: TRANSESOPHAGEAL ECHOCARDIOGRAM (TEE);  Surgeon: Fay Records, MD;  Location[facility];  Service: Cardiovascular;  Laterality: N/A;    SOCIAL HISTORY: Social History   Socioeconomic History   Marital status: Single    Spouse name: Not on file   Number of children: 1   Years of education: Not on file   Highest education level: High school graduate  Occupational History   Not on file  Tobacco Use   Smoking status: Every Day    Packs/day: 0.50  Types: Cigarettes   Smokeless tobacco: Never  Vaping Use   Vaping Use: Never used  Substance and Sexual Activity   Alcohol use: Yes    Comment: occasionally   Drug use: Never   Sexual activity: Yes    Birth control/protection: I.U.D.  Other Topics Concern   Not on file  Social History Narrative   Lives at home with family   Left handed   Caffeine: coffee, 2-4 cups/day   Social Determinants of Health   Financial Resource Strain: Not on file  Food  Insecurity: Not on file  Transportation Needs: Not on file  Physical Activity: Not on file  Stress: Not on file  Social Connections: Not on file  Intimate Partner Violence: Not on file    FAMILY HISTORY: Family History  Problem Relation Age of Onset   Diabetes Mother    Seizures Mother    Cancer Mother        Colon   Hypertension Mother    Stroke Mother    Migraines Neg Hx    Headache Neg Hx    Breast cancer Neg Hx    Ovarian cancer Neg Hx    Endometrial cancer Neg Hx    Pancreatic cancer Neg Hx    Prostate cancer Neg Hx     ALLERGIES:  is allergic to amoxicillin and penicillins.  MEDICATIONS:  Current Outpatient Medications  Medication Sig Dispense Refill   aspirin EC 81 MG tablet Take 81 mg by mouth daily. Swallow whole.     DULoxetine (CYMBALTA) 60 MG capsule Take 120 mg by mouth daily.     Erenumab-aooe (AIMOVIG) 70 MG/ML SOAJ Inject 70 mg into the skin every 14 (fourteen) days. 2 mL 6   ferrous sulfate 325 (65 FE) MG EC tablet Take 1 tablet (325 mg total) by mouth daily with breakfast. 30 tablet 3   linaclotide (LINZESS) 290 MCG CAPS capsule Take 290 mcg by mouth daily as needed (constipation).     prazosin (MINIPRESS) 1 MG capsule Take 1 mg by mouth 2 (two) times daily. Pt works 3rd shift so takes med at [employer] and at bedtime.     QUEtiapine Fumarate (SEROQUEL XR) 150 MG 24 hr tablet Take 150 mg by mouth at bedtime.     Ubrogepant (UBRELVY) 100 MG TABS Take 100 mg by mouth every 2 (two) hours as needed. Maximum 200mg  a day. 16 tablet 11   zolpidem (AMBIEN) 10 MG tablet Take 10 mg by mouth at bedtime as needed for sleep.     No current facility-administered medications for this visit.    REVIEW OF SYSTEMS:   Constitutional: ( - ) fevers, ( - )  chills , ( - ) night sweats Eyes: ( - ) blurriness of vision, ( - ) double vision, ( - ) watery eyes Ears, nose, mouth, throat, and face: ( - ) mucositis, ( - ) sore throat Respiratory: ( - ) cough, ( - ) dyspnea, ( - )  wheezes Cardiovascular: ( - ) palpitation, ( - ) chest discomfort, ( - ) lower extremity swelling Gastrointestinal:  ( - ) nausea, ( - ) heartburn, ( - ) change in bowel habits Skin: ( - ) abnormal skin rashes Lymphatics: ( - ) new lymphadenopathy, ( - ) easy bruising Neurological: ( - ) numbness, ( - ) tingling, ( - ) new weaknesses Behavioral/Psych: ( - ) mood change, ( - ) new changes  All other systems were reviewed with the patient and are negative.  PHYSICAL EXAMINATION: ECOG PERFORMANCE STATUS: 1 - Symptomatic but completely ambulatory  Vitals:   11/23/21 1537  BP: 135/85  Pulse: 84  Resp: 17  Temp: 98.1 F (36.7 C)  SpO2: 100%   Filed Weights   11/23/21 1537  Weight: 201 lb 4 oz (91.3 kg)    GENERAL: well appearing African American female in NAD  SKIN: skin color, texture, turgor are normal, no rashes or significant lesions EYES: conjunctiva are pink and non-injected, sclera clear LUNGS: clear to auscultation and percussion with normal breathing effort HEART: regular rate & rhythm and no murmurs and no lower extremity edema ABDOMEN: soft, non-tender, non-distended, normal bowel sounds Musculoskeletal: no cyanosis of digits and no clubbing  PSYCH: alert & oriented x 3, fluent speech NEURO: no focal motor/sensory deficits  LABORATORY DATA:  I have reviewed the data as listed CBC Latest Ref Rng & Units 11/23/2021 09/27/2021 06/16/2021  WBC 4.0 - 10.5 K/uL 8.8 10.0 9.0  Hemoglobin 12.0 - 15.0 g/dL 13.3 12.5 11.8(L)  Hematocrit 36.0 - 46.0 % 38.9 36.4 35.1(L)  Platelets 150 - 400 K/uL 228 284 298    CMP Latest Ref Rng & Units 10/26/2021 06/08/2021 09/07/2020  Glucose 70 - 99 mg/dL 87 85 85  BUN 6 - 20 mg/dL 10 11 6   Creatinine 0.57 - 1.00 mg/dL 1.01(H) 0.90 0.93  Sodium 134 - 144 mmol/L 139 141 136  Potassium 3.5 - 5.2 mmol/L 3.9 3.7 3.4(L)  Chloride 96 - 106 mmol/L 101 110 104  CO2 20 - 29 mmol/L 26 23 21(L)  Calcium 8.7 - 10.2 mg/dL 9.5 8.9 9.5  Total Protein  6.5 - 8.1 g/dL - - -  Total Bilirubin 0.3 - 1.2 mg/dL - - -  Alkaline Phos 38 - 126 U/L - - -  AST 15 - 41 U/L - - -  ALT 0 - 44 U/L - - -     ASSESSMENT & PLAN Tabitha Smith is a 31 y.o. female returns for a follow up for iron deficiency anemia.   #Elevated anticardiolipin IgM level: --Reviewed most recent evated anticardiolipin IgM level of 28. Prior level was 19.  --Does not meet criteria for antiphospholipid antibody syndrome --No further intervention is required.   #Decreased protein S activity: --Decreased to 37% in November 2021 when admitted for CVA --Repeat Protein S level and activity was checked on 06/16/2021 and was within normal limits.  --No further intervention is required.   #Iron deficiency anemia: --Currently on ferrous sulfate 325 mg once daily, does cause some constipation. Ok to continue every other day.  --Received IV feraheme 510 mg x 2 doses on 10/05/2021 and 10/12/2021: --Since patient reports very light menstrual periods, made referral to GI to rule out malabsorption versus GI bleeding. Not scheduled yet so will request again.  --Labs today show anemia has resolved with Hgb 13.3. Iron panel shows serum iron 84, TIB 374, saturation 22% and ferritin 431.  --RTC in 4 months with repeat labs  #Fatigue: --Advised to follow up with PCP to evaluate for other causes of fatigue.   No orders of the defined types were placed in this encounter.   All questions were answered. The patient knows to call the clinic with any problems, questions or concerns.  I have spent a total of 25 minutes minutes of face-to-face and non-face-to-face time, preparing to see the patient, obtaining and/or reviewing separately obtained history, performing a medically appropriate examination, counseling and educating the patient, ordering medications,  documenting clinical information in the  electronic health record, and care coordination.   [name], PA-C Department of  Hematology/Oncology [place] [facility] Phone: [phone]

## 2021-11-28 ENCOUNTER — Telehealth: Payer: Self-pay

## 2021-11-28 ENCOUNTER — Encounter: Payer: Self-pay | Admitting: Gastroenterology

## 2021-11-28 NOTE — Telephone Encounter (Signed)
-----   Message from Briant Cedar, PA-C sent at 11/28/2021 11:24 AM EST ----- Please notify patient that there is no evidence of iron deficiency anemia. Okay to continue iron pills and take every other day to minimize constipation.

## 2021-11-28 NOTE — Telephone Encounter (Signed)
Pt advised of lab results and voiced understanding.  She has not heard for [facility].  I will reach out to them to see when she will be scheduled

## 2021-11-29 NOTE — Telephone Encounter (Signed)
Appt with  GI is 12/22/21

## 2021-11-30 ENCOUNTER — Encounter: Payer: Self-pay | Admitting: Neurology

## 2021-12-21 ENCOUNTER — Encounter: Payer: Self-pay | Admitting: Neurology

## 2021-12-21 ENCOUNTER — Ambulatory Visit: Payer: Medicaid Other | Admitting: Neurology

## 2021-12-21 VITALS — BP 128/85 | HR 81 | Ht 65.0 in | Wt 204.2 lb

## 2021-12-21 DIAGNOSIS — R569 Unspecified convulsions: Secondary | ICD-10-CM

## 2021-12-21 MED ORDER — LEVETIRACETAM ER 500 MG PO TB24: 1000.0000 mg | ORAL_TABLET | Freq: Every day | ORAL | 3 refills | Status: DC

## 2021-12-21 NOTE — Patient Instructions (Signed)
No driving until 6 months seizure free.  Levetiracetam Tablets What is this medication? LEVETIRACETAM (lee ve tye RA se tam) prevents and controls seizures in people with epilepsy. It works by calming overactive nerves in your body. This medicine may be used for other purposes; ask your health care provider or pharmacist if you have questions. COMMON BRAND NAME(S): Keppra, Roweepra What should I tell my care team before I take this medication? They need to know if you have any of these conditions: Kidney disease Suicidal thoughts, plans, or attempt; a previous suicide attempt by you or a family member An unusual or allergic reaction to levetiracetam, other medications, foods, dyes, or preservatives Pregnant or trying to get pregnant Breast-feeding How should I use this medication? Take this medication by mouth with a glass of water. Follow the directions on the prescription label. Swallow the tablets whole. Do not crush or chew this medication. You may take this medication with or without food. Take your doses at regular intervals. Do not take your medication more often than directed. Do not stop taking this medication or any of your seizure medications unless instructed by your care team. Stopping your medication suddenly can increase your seizures or their severity. A special MedGuide will be given to you by the pharmacist with each prescription and refill. Be sure to read this information carefully each time. Contact your care team about the use of this medication in children. While this medication may be prescribed for children as young as 81 years of age for selected conditions, precautions do apply. Overdosage: If you think you have taken too much of this medicine contact a poison control center or emergency room at once. NOTE: This medicine is only for you. Do not share this medicine with others. What if I miss a dose? If you miss a dose, take it as soon as you can. If it is almost time  for your next dose, take only that dose. Do not take double or extra doses. What may interact with this medication? This medication may interact with the following: Carbamazepine Colesevelam Probenecid Sevelamer This list may not describe all possible interactions. Give your health care provider a list of all the medicines, herbs, non-prescription drugs, or dietary supplements you use. Also tell them if you smoke, drink alcohol, or use illegal drugs. Some items may interact with your medicine. What should I watch for while using this medication? Visit your care team for a regular check on your progress. Wear a medical identification bracelet or chain to say you have epilepsy, and carry a card that lists all your medications. This medication may cause serious skin reactions. They can happen weeks to months after starting the medication. Contact your care team right away if you notice fevers or flu-like symptoms with a rash. The rash may be red or purple and then turn into blisters or peeling of the skin. Or, you might notice a red rash with swelling of the face, lips or lymph nodes in your neck or under your arms. It is important to take this medication exactly as instructed by your care team. When first starting treatment, your dose may need to be adjusted. It may take weeks or months before your dose is stable. You should contact your care team if your seizures get worse or if you have any new types of seizures. You may get drowsy or dizzy. Do not drive, use machinery, or do anything that needs mental alertness until you know how this medication affects  you. Do not stand or sit up quickly, especially if you are an older patient. This reduces the risk of dizzy or fainting spells. Alcohol may interfere with the effect of this medication. Avoid alcoholic drinks. The use of this medication may increase the chance of suicidal thoughts or actions. Pay special attention to how you are responding while on this  medication. Any worsening of mood, or thoughts of suicide or dying should be reported to your care team right away. Women who become pregnant while using this medication may enroll in the [place] Antiepileptic Drug Pregnancy Registry by calling [phone]. This registry collects information about the safety of antiepileptic medication use during pregnancy. What side effects may I notice from receiving this medication? Side effects that you should report to your care team as soon as possible: Allergic reactions--skin rash, itching, hives, swelling of the face, lips, tongue, or throat Increase in blood pressure in children Infection--fever, chills, cough, or sore throat Loss of balance or coordination Low red blood cell count--unusual weakness or fatigue, dizziness, headache, trouble breathing Mood and behavior changes--anxiety, nervousness, confusion, hallucinations, irritability, hostility, thoughts of suicide or self-harm, worsening mood, feelings of depression Redness, blistering, peeling, or loosening of the skin, including inside the mouth Trouble walking Unusual bruising or bleeding Unusual weakness or fatigue Side effects that usually do not require medical attention (report to your care team if they continue or are bothersome): Dizziness Drowsiness Fatigue Irritability Loss of appetite This list may not describe all possible side effects. Call your doctor for medical advice about side effects. You may report side effects to FDA at 1-800-FDA-1088. Where should I keep my medication? Keep out of reach of children. Store at room temperature between 15 and 30 degrees C (59 and 86 degrees F). Throw away any unused medication after the expiration date. NOTE: This sheet is a summary. It may not cover all possible information. If you have questions about this medicine, talk to your doctor, pharmacist, or health care provider.  2022 Elsevier/Gold Standard (2021-02-11 00:00:00)

## 2021-12-21 NOTE — Progress Notes (Signed)
[name]    Provider:  Dr Jaynee Eagles Referring Provider: [name], NP Primary Care Physician:  [name]  CC:  Migraines, stroke, now new seizure activity  Interval history 11/20/2021:  She hs had 4 "seizures" will start keppra and order 72 hour ambulatory eeg. She did not report any of them to me. She has video but it is difficult to see what is going on in the first video can only see part of her head moving slighly back and forth. The second one she is sitting, eyes open, unresponsive for about 30 seconds and then she wants to go to sleep and she doesn't remember. We tried to get her a 62 eeg an wasn't covered by insurance. She has missed some days of work. She was sitting at work for one and her screen just stopped moving, noone answered her phone, she was sleeping in the chair, she kind of feels off right beforehand, no known triggers, no tongue biting, no urination.  MRI brain 11/16/2021: IMPRESSION: This MRI of the brain with and without contrast shows the following: 1.  No acute findings. 2.  Normal enhancement pattern. 3.  Brain parenchyma appears normal.  Specifically, sequelae of the small acute infarctions noted on the 09/02/2020 were not noted   Interval history 10/26/2021: Since I last saw patient 05/2020,  lots has transpired. Patient was on [place] and doing well in 12/2020; her baseline was 23 headache days and 15 migraines but on aimovig she reduced to 1-2 migraines. In 08/2020 she was hospitalized in the setting of septic shock with acute combined systolic diastolic heart failure and possible seizure activity, aspiration pneumonia and acute CVA, in the setting of a root canal 2 days prior, symptoms seem to improve with Narcan, echo showed reduced ejection fraction and global hypokinesis but TEE showed no evidence of embolic source and normal left ventricular systolic function, Zio patch was ordered by cardiology which was normal, follow-up TEE and  echo showed heart function had returned to normal.  Since triptans were now contraindicated she was started on Nurtec, she was encouraged to follow-up with cardiology and continue ASA for stroke prevention.  She followed up with [name] her stroke nurse in July 2022, at that time she had been stable from a stroke standpoint without new or recurring stroke TIA symptoms, she did have follow-up with cardiology and repeat echo in November 2021 showing normal EF, she completed a cardiac monitor which was negative.  At the time of admission for stroke she was found unresponsive with tongue biting.  She was also found to have protein S deficiency and she has been to see hematology for Hypercoagulable workup was positive for mild elevation of anticardiolipin Igm at 19 and decreased protein S activity at 37%.05/26/2021: Repeat cardiolipin antibody panel revealed mild increase in anticardiolipin IgM at 28.  She last saw hematology oncology in November of this year and continue to report migraines, chronic headaches, dizziness, presyncopal events.  She reported being on Cymbalta, Aimovig, Ambien some of her medications.  She did not meet criteria for antiphospholipid antibody syndrome and no further intervention was required for elevated anticardiolipin IgM level, repeat protein S level activity was checked in August 2022 and was within normal limits no further intervention required, she was diagnosed with iron deficiency anemia likely causing her fatigue and IV infusions were requested.  She was also referred to GI, I encouraged her to call [name] because she has not heard back.   She  was recently seen in the emergency room earlier this month after having an episode where she had possible seizure activity, 2 or 3 of these, friend described unresponsiveness and then confused afterwards, no reported tonic-clonic activity, no loss of bowel or bladder, no tongue biting, no prior history of seizures except the  unresponsiveness prior to her admission for stroke and acute heart failure, CT of the head showed no acute abnormality. Here with her friend, her whole body tenses up, shaking slightly, incoherent, lasted 30-45 seconds, then opened eyes and no postictal confusion, when awake she zones out and doesn't remember the conversation, it is happening every few days, discussed no driving. She had a clean bill of health, had a monitor, cardiac wise everything was normal again. She is here with her friend who has witnessed her events. She had iron infusion treatments. She just started back on the aimovig which is extremely helpful for migraines.   EEG 09/03/2020: IMPRESSION: personally reviewed  This study is within normal limits. No seizures or epileptiform discharges were seen throughout the recording.  Personally reviewed imaging and agree with the following: CT head 10/10/2021: IMPRESSION: 1. No evidence of acute intracranial abnormality. 2. Partially visualized left maxillary sinus fluid and mucosal thickening. Correlate for acute sinusitis.  Triptans contraindicated due ot stroke. Try [name].she has failed multiple triptans including imitrexPO an dinjection, rizatriptan, zolmitriptanPO and nasal, eletriptan, alleve, tylenol, excedrin, naproxen, benadryl, robaxin,flexeril,methyprednisolone, zofran, tramadol, nurtec, seroquel, phenergan,compazine, oxy, topiramate, amitriptyline, reglan, naproxen, antivert, Propranolol, Nortriptyline, Fioricet, she has tried "everything", Zomig nasal, maxalt,aimovig,ajovy,emgality  Patient complains of symptoms per HPI as well as the following symptoms: fatigue . Pertinent negatives and positives per HPI. All others negative  Spent additional 0 minutes reviewing videos of her episodes and discussing care with Dr. April Manson  Interval history June 14, 2020: Patient was approved for Ajovy last appointment, from a review of records she was seen in the emergency room April 10, 2020  with right shoulder pain for 2 weeks with onset of migraine the day before, migraine began the day before and unrelieved by previous migraine medications, right-sided, pulsatile with light sensitivity, she denied any recent fever, dizziness, chest pain shortness of breath, abdominal pain or nausea vomiting, she has multiple medications that can be used for her migraines on her med list including Zofran, Nurtec, Imitrex, Ajovy,  She was given a Toradol injection.  She was given Robaxin as needed, exam consistent with muscle strain, also recommended she use heating pad, migraine likely triggered by this issue and Toradol IM helped and she was discharged with Robaxin.  Ajovy didn't help. Aimovig worked great but was wearing off by the end of the month We will prescriber her 70mg  twice daily. She did great on the Aimovig. Will try and get Nurtec approved she has failed multiple triptans including imitrex, rizatriptan, zolmitriptan, eletriptan. We discussed teratogenicity of medications, will check urine pregnancy test, asked her to always use condoms and backup. Discussed at length. Also discussed other options, botox, oral medications, acute and preventative measures.  Interval history 12/16/2019:  Patient here for follow up, she ran out of [place] and she couldn't get it. When she was on the aimovig she was doing great initially but Aimovig started wearing off and she would also have recurrence of migraines at the end of the month, but she hadn't taken it in several months, she is not planning on children. The migaines are back, we will restart but change to Ajovy due to wearing off of Aimovig.  As far as acute medications she tried imitrex, zomig nasal and maxalt.  We did have a long talk today, we will try to get Ajovy approved, she has tried multiple triptans we discussed other options and we decided on Nurtec.  I did give her some samples of Nurtec as well as Ajovy to get her through until we can get it  approved.  Interval history; Patient doing exceptionally well on Aimovig. She has had only a few migraines since starting and one was when she was last for the Aimovig. Her baseline is 23 headache days a month.  15 migraine days a month. MRi brain normal.  She has drainage and chronic sinusitis. Reviewed images with patient, she has drainage and sinus symptoms, MRI showed: "mucoperiosteal thickening and some mucous retention cysts within the left maxillary sinus and large mucous retention cyst within the right maxillary sinus."  HPI:  Tabitha Smith is a 31 y.o. female here as requested by Dr. Coralyn Mark for migraines. She has had them since a child. She missed lots of days of school in HS. Migraines/headaches worsening since having son 2 years ago. Migraines can be unilateral , eye pain, +photo/phonophobia, +nausea and vomiting, has to go into a dark room which helps, can't have any noise. Marland Kitchen Anxiety and stress makes it worse. 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 She also has intermittent dizziness with and without the headache. She feels like she just got off an elevator. When she lays down she gets a rush of dizziness and headache, positionally worse. She wakes in the morning with headaches, they have woken her up in the middle of the night. She is having numbness on the left side of her face. Not having anymore children. Not sexually active and uses birth control. No aura. No medication overuse.    Medications tried: Sumatriptan, Topiramate, Propranolol, Nortriptyline, Fioricet, she has tried "everything", Zomig nasal, maxalt,Triptans contraindicated due ot stroke. Try [name].she has failed multiple triptans including imitrexPO an dinjection, rizatriptan, zolmitriptanPO and nasal, eletriptan, alleve, tylenol, excedrin, naproxen, benadryl, robaxin,flexeril,methyprednisolone, zofran, tramadol, nurtec, seroquel, phenergan,compazine, oxy, topiramate, amitriptyline, reglan, naproxen,  antivert, Propranolol, Nortriptyline, Fioricet, she has tried "everything", Zomig nasal, maxalt,aimovig,ajovy,emgality  Reviewed notes, labs and imaging from outside physicians, which showed:  Reviewed notes from referring physician Dr. Blenda Nicely.  Patient was seen in ear nose and throat for the evaluation of dizziness.  The problem intermittent.  Worse with moving around and last a few seconds.  Started 3 weeks prior to visit which was July 22, 2018.  Feels like she just got off an elevator, no hearing loss, no vertigo or room spinning, no voice changes she does report tingling in the hands and headaches with pain in the sides of the heads, with headaches there is photophobia and phonophobia, she is has history of migraines in the past.  Antinausea medications did not help.  Reviewed exam and work-up which was unremarkable.  Patient was scheduled for Dix-Hallpike with lenses and audiometric testing, and referred to neurology suspecting likely migraine disorder.   Review of Systems: Patient complains of symptoms per HPI as well as the following symptoms:shoulder pain, migraines . Pertinent negatives and positives per HPI. All others negative    Social History   Socioeconomic History   Marital status: Single    Spouse name: Not on file   Number of children: 1   Years of education: Not on file   Highest education level: High school graduate  Occupational History  Not on file  Tobacco Use   Smoking status: Every Day    Packs/day: 0.25    Types: Cigarettes   Smokeless tobacco: Never  Vaping Use   Vaping Use: Never used  Substance and Sexual Activity   Alcohol use: Yes    Comment: occasionally   Drug use: Never   Sexual activity: Yes    Birth control/protection: I.U.D.  Other Topics Concern   Not on file  Social History Narrative   Lives at home with family   Left handed   Caffeine: coffee, 2-4 cups/day   Social Determinants of Health   Financial Resource Strain: Not on  file  Food Insecurity: Not on file  Transportation Needs: Not on file  Physical Activity: Not on file  Stress: Not on file  Social Connections: Not on file  Intimate Partner Violence: Not on file    Family History  Problem Relation Age of Onset   Diabetes Mother    Seizures Mother    Cancer Mother        Colon   Hypertension Mother    Stroke Mother    Migraines Neg Hx    Headache Neg Hx    Breast cancer Neg Hx    Ovarian cancer Neg Hx    Endometrial cancer Neg Hx    Pancreatic cancer Neg Hx    Prostate cancer Neg Hx     Past Medical History:  Diagnosis Date   Adnexal mass    Anxiety    CHF (congestive heart failure) (HCC)    Depression    History of acute heart failure    Hx of septic shock    IBS (irritable bowel syndrome)    Migraine without aura and without status migrainosus, not intractable 10/26/2021   Pneumonia    PONV (postoperative nausea and vomiting)    Seizures ([place])    Sinusitis    Stroke [facility])     Past Surgical History:  Procedure Laterality Date   BUBBLE STUDY  09/06/2020   Procedure: BUBBLE STUDY;  Surgeon: Fay Records, MD;  Location: [place];  Service: Cardiovascular;;   DILATION AND CURETTAGE OF UTERUS N/A 06/14/2021   Procedure: DILATATION AND CURETTAGE;  Surgeon: Lafonda Mosses, MD;  Location: [facility];  Service: Gynecology;  Laterality: N/A;   INTRAUTERINE DEVICE (IUD) INSERTION N/A 06/14/2021   Procedure: [place] (IUD) INSERTION;  Surgeon: Lafonda Mosses, MD;  Location: [facility];  Service: Gynecology;  Laterality: N/A;   NO PAST SURGERIES     stitches to abdomen      for a cut    TEE WITHOUT CARDIOVERSION N/A 09/06/2020   Procedure: TRANSESOPHAGEAL ECHOCARDIOGRAM (TEE);  Surgeon: Fay Records, MD;  Location: [facility];  Service: Cardiovascular;  Laterality: N/A;    Current Outpatient Medications  Medication Sig Dispense Refill   aspirin EC 81 MG tablet Take 81 mg by mouth daily. Swallow whole.      DULoxetine (CYMBALTA) 60 MG capsule Take 120 mg by mouth daily.     Erenumab-aooe (AIMOVIG) 70 MG/ML SOAJ Inject 70 mg into the skin every 14 (fourteen) days. 2 mL 6   ferrous sulfate 325 (65 FE) MG EC tablet Take 1 tablet (325 mg total) by mouth daily with breakfast. 30 tablet 3   linaclotide (LINZESS) 290 MCG CAPS capsule Take 290 mcg by mouth daily as needed (constipation).     prazosin (MINIPRESS) 1 MG capsule Take 1 mg by mouth 2 (two) times daily. Pt works  3rd shift so takes med at 1pm and at bedtime.     QUEtiapine Fumarate (SEROQUEL XR) 150 MG 24 hr tablet Take 150 mg by mouth at bedtime.     Ubrogepant (UBRELVY) 100 MG TABS Take 100 mg by mouth every 2 (two) hours as needed. Maximum 200mg  a day. 16 tablet 11   zolpidem (AMBIEN) 10 MG tablet Take 10 mg by mouth at bedtime as needed for sleep.     No current facility-administered medications for this visit.    Allergies as of 12/21/2021 - Review Complete 12/21/2021  Allergen Reaction Noted   Amoxicillin Anaphylaxis 09/10/2017   Penicillins Anaphylaxis 06/19/2016    Vitals: BP 128/85    Pulse 81    Ht 5\' 5"  (1.651 m)    Wt 204 lb 3.2 oz (92.6 kg)    BMI 33.98 kg/m  Last Weight:  Wt Readings from Last 1 Encounters:  12/21/21 204 lb 3.2 oz (92.6 kg)   Last Height:   Ht Readings from Last 1 Encounters:  12/21/21 5\' 5"  (1.651 m)   Physical exam: Exam: Gen: NAD, conversant, well nourised, well groomed                     CV: RRR, no MRG. No Carotid Bruits. No peripheral edema, warm, nontender Eyes: Conjunctivae clear without exudates or hemorrhage  Neuro: Detailed Neurologic Exam  Speech:    Speech is normal; fluent and spontaneous with normal comprehension.  Cognition:    The patient is oriented to person, place, and time;     recent and remote memory intact;     language fluent;     normal attention, concentration,     fund of knowledge Cranial Nerves:    The pupils are equal, round, and reactive to light. The fundi  are flat Visual fields are full to finger confrontation. Extraocular movements are intact. Trigeminal sensation is intact and the muscles of mastication are normal. The face is symmetric. The palate elevates in the midline. Hearing intact. Voice is normal. Shoulder shrug is normal. The tongue has normal motion without fasciculations.   Coordination:    Normal   Gait:     normal.   Motor Observation:    No asymmetry, no atrophy, and no involuntary movements noted. Tone:    Normal muscle tone.    Posture:    Posture is normal. normal erect    Strength:    Strength is V/V in the upper and lower limbs.      Sensation: intact to LT     Reflex Exam:  DTR's:    Deep tendon reflexes in the upper and lower extremities are normal bilaterally.   Toes:    The toes are downgoing bilaterally.   Clonus:    Clonus is absent.   Assessment/Plan:  31 year old with chronic intractable migraines and now alterations of awareness/ witnessed seizure-like activity. In 08/2020 she was hospitalized in the setting of septic shock with acute combined systolic diastolic heart failure and possible seizure activity, aspiration pneumonia and acute CVA, in the setting of a root canal 2 days prior, symptoms seem to improve with Narcan, echo showed reduced ejection fraction and global hypokinesis but TEE showed no evidence of embolic source, follow-up TEE and echo showed heart function had returned to normal.  She followed up with [name] her stroke nurse in July 2022, at that time she had been stable from a stroke standpoint without new or recurring stroke TIA symptoms,  she did have follow-up with cardiology and repeat echo in November 2021 showing normal EF, she completed a cardiac monitor which was negative.  She saw hematology   She did not meet criteria for antiphospholipid antibody syndrome and no further intervention was required for elevated anticardiolipin IgM level, repeat protein S level activity was  checked in August 2022 and was within normal limits no further intervention required, she was diagnosed with iron deficiency anemia likely causing her fatigue and IV infusions were requested.  She was also referred to GI, pending  - She was recently seen in the emergency room earlier this month after having episodes where she had possible seizure activity, 2 or 3 of these, every few days friend described unresponsiveness, no reported tonic-clonic activity, no loss of bowel or bladder, no tongue biting, no prior history of seizures except the unresponsiveness prior to her admission for stroke and acute heart failure, CT of the head showed no acute abnormality. EEG 09/03/2020 in hospital negative. Need 96 hour ambulatory EEG to catch event that she has every "3-4 days"(insurance declined), MRI brain completed no new events. She has had 4 known episodes in the last 8 weeks. She has video her eyes are open, she is unresponsive, 30 seconds, then sleeps for several hours. Mother has seizures.  Start Keppra. She does not do well with bid meds will try and see if insurance will pay for XR.   Meds ordered this encounter  Medications   levETIRAcetam (KEPPRA XR) 500 MG 24 hr tablet    Sig: Take 2 tablets (1,000 mg total) by mouth at bedtime.    Dispense:  60 tablet    Refill:  3     - strokes appear generalized like absence, she had strokes but I'd think those would act for focal, no focal events prior to just becoming unresponsive for 30 seconds then needing to sleep. Will try to order a prolonged EEG, recommend AED and also taking folic acid or a prenatal.   Doing great on Aimovig and [name] is amazing.   No orders of the defined types were placed in this encounter.  - Doing great on Aimovig now has 4 migraines a month only and 4 headache days a month (Total of only 8 monthly headaches). Triptans contraindicated due ot stroke. Try [name].she has failed multiple triptans including imitrexPO an dinjection,  rizatriptan, zolmitriptanPO and nasal, eletriptan, alleve, tylenol, excedrin, naproxen, benadryl, robaxin,flexeril,methyprednisolone, zofran, tramadol, nurtec, seroquel, phenergan,compazine, oxy, topiramate, amitriptyline, reglan, naproxen, antivert, Propranolol, Nortriptyline, Fioricet, she has tried "everything", Zomig nasal, maxalt,  - Ajovy did not work. Will try Aimovig 70mg  twice monthly.  Ajovy didn't help. Aimovig worked great but was wearing off by the end of the month We will prescriber her 70mg  twice monthly.   - discussed teratogenicity do not get pregnant.  - continue ASA 81mg  daily  Discussed AGAIN: Per [facility] statutes, patients with seizures are not allowed to drive until they have been seizure-free for six months.    Use caution when using heavy equipment or power tools. Avoid working on ladders or at heights. Take showers instead of baths. Ensure the water temperature is not too high on the home water heater. Do not go swimming alone. Do not lock yourself in a room alone (i.e. bathroom). When caring for infants or small children, sit down when holding, feeding, or changing them to minimize risk of injury to the child in the event you have a seizure. Maintain good sleep hygiene. Avoid alcohol.  If patient has another seizure, call 911 and bring them back to the ED if: A.  The seizure lasts longer than 5 minutes.      B.  The patient doesn't wake shortly after the seizure or has new problems such as difficulty seeing, speaking or moving following the seizure C.  The patient was injured during the seizure D.  The patient has a temperature over 102 F (39C) E.  The patient vomited during the seizure and now is having trouble breathing  Per [facility] statutes, patients with seizures are not allowed to drive until they have been seizure-free for six months.  Other recommendations include using caution when using heavy equipment or power tools. Avoid working on  ladders or at heights. Take showers instead of baths.  Do not swim alone.  Ensure the water temperature is not too high on the home water heater. Do not go swimming alone. Do not lock yourself in a room alone (i.e. bathroom). When caring for infants or small children, sit down when holding, feeding, or changing them to minimize risk of injury to the child in the event you have a seizure. Maintain good sleep hygiene. Avoid alcohol.  Also recommend adequate sleep, hydration, good diet and minimize stress.  During the Seizure  - First, ensure adequate ventilation and place patients on the floor on their left side  Loosen clothing around the neck and ensure the airway is patent. If the patient is clenching the teeth, do not force the mouth open with any object as this can cause severe damage - Remove all items from the surrounding that can be hazardous. The patient may be oblivious to what's happening and may not even know what he or she is doing. If the patient is confused and wandering, either gently guide him/her away and block access to outside areas - Reassure the individual and be comforting - Call 911. In most cases, the seizure ends before EMS arrives. However, there are cases when seizures may last over 3 to 5 minutes. Or the individual may have developed breathing difficulties or severe injuries. If a pregnant patient or a person with diabetes develops a seizure, it is prudent to call an ambulance. - Finally, if the patient does not regain full consciousness, then call EMS. Most patients will remain confused for about 45 to 90 minutes after a seizure, so you must use judgment in calling for help. - Avoid restraints but make sure the patient is in a bed with padded side rails - Place the individual in a lateral position with the neck slightly flexed; this will help the saliva drain from the mouth and prevent the tongue from falling backward - Remove all nearby furniture and other hazards from the  area - Provide verbal assurance as the individual is regaining consciousness - Provide the patient with privacy if possible - Call for help and start treatment as ordered by the caregiver   fter the Seizure (Postictal Stage)  After a seizure, most patients experience confusion, fatigue, muscle pain and/or a headache. Thus, one should permit the individual to sleep. For the next few days, reassurance is essential. Being calm and helping reorient the person is also of importance.  Most seizures are painless and end spontaneously. Seizures are not harmful to others but can lead to complications such as stress on the lungs, brain and the heart. Individuals with prior lung problems may develop labored breathing and respiratory distress.    Sarina Ill, MD  [facility] [address] [place], [place] [address]  Phone [phone] Fax [phone]

## 2021-12-22 ENCOUNTER — Encounter: Payer: Self-pay | Admitting: Gastroenterology

## 2021-12-22 ENCOUNTER — Ambulatory Visit: Payer: Medicaid Other | Admitting: Gastroenterology

## 2021-12-22 VITALS — BP 134/86 | HR 87 | Ht 65.0 in | Wt 204.6 lb

## 2021-12-22 DIAGNOSIS — D509 Iron deficiency anemia, unspecified: Secondary | ICD-10-CM

## 2021-12-22 MED ORDER — NA SULFATE-K SULFATE-MG SULF 17.5-3.13-1.6 GM/177ML PO SOLN: 1.0000 | Freq: Once | ORAL | 0 refills | Status: AC

## 2021-12-22 NOTE — Progress Notes (Signed)
Referring Provider: Cordelia Poche Primary Care Physician:  [name], NP  Reason for Consultation: Iron deficiency anemia   IMPRESSION:  Iron deficiency anemia without known overt or occult bleeding, may be exacerbated by heavy menses that have occurred since her IUD was placed 8/22. Must exclude GI blood loss.  Family history of colon cancer. Mother at age 33. Will need close surveillance colonoscopies over time.   PLAN: - EGD and colonoscopy with a 2 day bowel prep - Obtain prior colonoscopy report from [place] 2021 - Follow-up with GYN given heavy menses following placement of Mirena   HPI: Tabitha Smith is a 31 y.o. female referred by PA Thayil for further evaluation of iron deficiency anemia.  The history is obtained through the patient, review of her referral records, and review of her electronic health record.  She had an embolic stroke [id number] with a hypercoagulable work-up positive for an elevated anticardiolipin.  She also carries a history of CHF, depression, anxiety, adnexal mass, and postoperative nausea and vomiting.  Last echocardiogram 12/21 showed an EF of 55 to 60%.  Referred by hematology for iron deficiency anemia.  No obvious source for her iron deficiency has been identified.  She has been receiving oral iron and recently started IV iron.  She reports chronic fatigue.  Pica to ice and blue playdough. Stable appetite but progressive weight gain over the last several months.  No weakness, headache, irritability, exercise intolerance, exertional dyspnea, vertigo, or angina pectoris. No beeturia.  No hearing loss  No overt GI blood loss. No melena, hematochezia, bright red blood per rectum. Intermittent LLQ pain. No change in bowel habits.  No epistaxis, hemoptysis, or hematuria. She has heavier menstrual periods since IUD placement in 8/22.  However, Mirena was ultimately placed for abnormal uterine bleeding.   Baseline bowel habits are 1 formed bowel  movement daily.  No identified exacerbating or relieving features.   Had a colonoscopy at [facility] in 2021 given her family history of colon cancer. She remembers the clean out wasn't good that she attributes to her chronic constipation.   Mother with colon cancer diagnosed at age 73 and died 29 weeks later.  There is no other known family history of colon cancer or polyps. No family history of stomach cancer or other GI malignancy. No family history of inflammatory bowel disease or celiac.   Labs 11/23/21: hgb 13.3, MCV 89, RDW 14.2, platelets 228   Past Medical History:  Diagnosis Date   Adnexal mass    Anxiety    CHF (congestive heart failure) (HCC)    Depression    History of acute heart failure    Hx of septic shock    IBS (irritable bowel syndrome)    Migraine without aura and without status migrainosus, not intractable 10/26/2021   Pneumonia    PONV (postoperative nausea and vomiting)    Seizures ([place])    Sinusitis    Stroke [facility])     Past Surgical History:  Procedure Laterality Date   BUBBLE STUDY  09/06/2020   Procedure: BUBBLE STUDY;  Surgeon: Fay Records, MD;  Location: [place];  Service: Cardiovascular;;   DILATION AND CURETTAGE OF UTERUS N/A 06/14/2021   Procedure: DILATATION AND CURETTAGE;  Surgeon: Lafonda Mosses, MD;  Location: [facility];  Service: Gynecology;  Laterality: N/A;   INTRAUTERINE DEVICE (IUD) INSERTION N/A 06/14/2021   Procedure: [place] (IUD) INSERTION;  Surgeon: Lafonda Mosses, MD;  Location: [facility];  Service: Gynecology;  Laterality: N/A;   NO PAST SURGERIES     stitches to abdomen      for a cut    TEE WITHOUT CARDIOVERSION N/A 09/06/2020   Procedure: TRANSESOPHAGEAL ECHOCARDIOGRAM (TEE);  Surgeon: Fay Records, MD;  Location: [facility] ENDOSCOPY;  Service: Cardiovascular;  Laterality: N/A;    Current Outpatient Medications  Medication Sig Dispense Refill   aspirin EC 81 MG tablet Take 81 mg by mouth daily. Swallow  whole.     DULoxetine (CYMBALTA) 60 MG capsule Take 120 mg by mouth daily.     Erenumab-aooe (AIMOVIG) 70 MG/ML SOAJ Inject 70 mg into the skin every 14 (fourteen) days. 2 mL 6   ferrous sulfate 325 (65 FE) MG EC tablet Take 1 tablet (325 mg total) by mouth daily with breakfast. 30 tablet 3   levETIRAcetam (KEPPRA XR) 500 MG 24 hr tablet Take 2 tablets (1,000 mg total) by mouth at bedtime. 60 tablet 3   linaclotide (LINZESS) 290 MCG CAPS capsule Take 290 mcg by mouth daily as needed (constipation).     prazosin (MINIPRESS) 1 MG capsule Take 1 mg by mouth 2 (two) times daily. Pt works 3rd shift so takes med at [employer] and at bedtime.     QUEtiapine Fumarate (SEROQUEL XR) 150 MG 24 hr tablet Take 150 mg by mouth at bedtime.     Ubrogepant (UBRELVY) 100 MG TABS Take 100 mg by mouth every 2 (two) hours as needed. Maximum 200mg  a day. 16 tablet 11   zolpidem (AMBIEN) 10 MG tablet Take 10 mg by mouth at bedtime as needed for sleep.     No current facility-administered medications for this visit.    Allergies as of 12/22/2021 - Review Complete 12/21/2021  Allergen Reaction Noted   Amoxicillin Anaphylaxis 09/10/2017   Penicillins Anaphylaxis 06/19/2016    Family History  Problem Relation Age of Onset   Diabetes Mother    Seizures Mother    Cancer Mother        Colon   Hypertension Mother    Stroke Mother    Migraines Neg Hx    Headache Neg Hx    Breast cancer Neg Hx    Ovarian cancer Neg Hx    Endometrial cancer Neg Hx    Pancreatic cancer Neg Hx    Prostate cancer Neg Hx     Social History   Socioeconomic History   Marital status: Single    Spouse name: Not on file   Number of children: 1   Years of education: Not on file   Highest education level: High school graduate  Occupational History   Not on file  Tobacco Use   Smoking status: Every Day    Packs/day: 0.25    Types: Cigarettes   Smokeless tobacco: Never  Vaping Use   Vaping Use: Never used  Substance and Sexual  Activity   Alcohol use: Yes    Comment: occasionally   Drug use: Never   Sexual activity: Yes    Birth control/protection: I.U.D.  Other Topics Concern   Not on file  Social History Narrative   Lives at home with family   Left handed   Caffeine: coffee, 2-4 cups/day   Social Determinants of Health   Financial Resource Strain: Not on file  Food Insecurity: Not on file  Transportation Needs: Not on file  Physical Activity: Not on file  Stress: Not on file  Social Connections: Not on file  Intimate Partner Violence: Not on file  Review of Systems: 12 system ROS is negative except as noted above with the addition of anxiety, back pain, depression, fatigue, headaches, menstrual pain, night sweats, sleeping problems, excessive thirst.   Physical Exam: General:   Alert,  well-nourished, pleasant and cooperative in NAD Head:  Normocephalic and atraumatic. Eyes:  Sclera clear, no icterus.   Conjunctiva pink. Ears:  Normal auditory acuity. Nose:  No deformity, discharge,  or lesions. Mouth:  No deformity or lesions.   Neck:  Supple; no masses or thyromegaly. Lungs:  Clear throughout to auscultation.   No wheezes. Heart:  Regular rate and rhythm; no murmurs. Abdomen:  Soft, nontender, nondistended, normal bowel sounds, no rebound or guarding. No hepatosplenomegaly.   Rectal:  Deferred  Msk:  Symmetrical. No boney deformities LAD: No inguinal or umbilical LAD Extremities:  No clubbing or edema. Neurologic:  Alert and  oriented x4;  grossly nonfocal Skin:  Intact without significant lesions or rashes. Psych:  Alert and cooperative. Normal mood and affect.    Marialena Wollen L. Tarri Glenn, MD, MPH 12/22/2021, 10:37 AM

## 2021-12-22 NOTE — Patient Instructions (Addendum)
It was my pleasure to provide care to you today. Based on our discussion, I am providing you with my recommendations below:  RECOMMENDATION(S):   COLONOSCOPY:   You have been scheduled for a colonoscopy. Please follow written instructions given to you at your visit today.   PREP:   Please pick up your prep supplies at the pharmacy within the next 1-3 days.  COLONOSCOPY TIPS:  To reduce nausea and dehydration, stay well hydrated for 3-4 days prior to the exam.  To prevent skin/hemorrhoid irritation - prior to wiping, put A&Dointment or vaseline on the toilet paper. Keep a towel or pad on the bed.  BEFORE STARTING YOUR PREP, drink  64oz of clear liquids in the morning. This will help to flush the colon and will ensure you are well hydrated!!!!  NOTE - This is in addition to the fluids required for to complete your prep. Use of a flavored hard candy, such as grape Rubin Payor, can counteract some of the flavor of the prep and may prevent some nausea.   FOLLOW UP:  After your procedure, you will receive a call from my office staff regarding my recommendation for follow up.  BMI:  If you are age 44 or older, your body mass index should be between 23-30. Your There is no height or weight on file to calculate BMI. If this is out of the aforementioned range listed, please consider follow up with your Primary Care Provider.  If you are age 99 or younger, your body mass index should be between 19-25. Your There is no height or weight on file to calculate BMI. If this is out of the aformentioned range listed, please consider follow up with your Primary Care Provider.   MY CHART:  The [facility] providers would like to encourage you to use [facility] to communicate with providers for non-urgent requests or questions.  Due to long hold times on the telephone, sending your provider a message by [facility] may be a faster and more efficient way to get a response.  Please allow 48 business hours for a  response.  Please remember that this is for non-urgent requests.   Thank you for trusting me with your gastrointestinal care!    Tressia Danas, MD, MPH

## 2022-01-10 ENCOUNTER — Encounter: Payer: Self-pay | Admitting: Gastroenterology

## 2022-01-10 ENCOUNTER — Other Ambulatory Visit: Payer: Self-pay

## 2022-01-10 ENCOUNTER — Telehealth: Payer: Self-pay

## 2022-01-10 ENCOUNTER — Ambulatory Visit (AMBULATORY_SURGERY_CENTER): Payer: Medicaid Other | Admitting: Gastroenterology

## 2022-01-10 ENCOUNTER — Inpatient Hospital Stay (HOSPITAL_COMMUNITY): Admit: 2022-01-10 | Payer: Self-pay

## 2022-01-10 VITALS — BP 122/73 | HR 76 | Temp 97.5°F | Resp 17 | Ht 65.0 in | Wt 204.0 lb

## 2022-01-10 DIAGNOSIS — D509 Iron deficiency anemia, unspecified: Secondary | ICD-10-CM

## 2022-01-10 DIAGNOSIS — K5730 Diverticulosis of large intestine without perforation or abscess without bleeding: Secondary | ICD-10-CM

## 2022-01-10 DIAGNOSIS — K2970 Gastritis, unspecified, without bleeding: Secondary | ICD-10-CM

## 2022-01-10 DIAGNOSIS — K319 Disease of stomach and duodenum, unspecified: Secondary | ICD-10-CM | POA: Diagnosis not present

## 2022-01-10 HISTORY — PX: COLONOSCOPY WITH ESOPHAGOGASTRODUODENOSCOPY (EGD): SHX5779

## 2022-01-10 MED ORDER — SODIUM CHLORIDE 0.9 % IV SOLN: 500.0000 mL | Freq: Once | INTRAVENOUS | Status: DC

## 2022-01-10 MED ORDER — PANTOPRAZOLE SODIUM 40 MG PO TBEC: 40.0000 mg | DELAYED_RELEASE_TABLET | Freq: Two times a day (BID) | ORAL | 2 refills | Status: DC

## 2022-01-10 NOTE — Op Note (Signed)
[facility] ?Patient Name: Tabitha Smith ?Procedure Date: 01/10/2022 1:58 PM ?MRN: [id number] ?Endoscopist: Thornton Park MD, MD ?Age: 31 ?Referring MD:  ?Date of Birth: 08-08-1991 ?Gender: Female ?Account #: [id number] ?Procedure:                Colonoscopy ?Indications:              Unexplained iron deficiency anemia ?Medicines:                Monitored Anesthesia Care ?Procedure:                Pre-Anesthesia Assessment: ?                          - Prior to the procedure, a History and Physical  ?                          was performed, and patient medications and  ?                          allergies were reviewed. The patient's tolerance of  ?                          previous anesthesia was also reviewed. The risks  ?                          and benefits of the procedure and the sedation  ?                          options and risks were discussed with the patient.  ?                          All questions were answered, and informed consent  ?                          was obtained. Prior Anticoagulants: The patient has  ?                          taken no previous anticoagulant or antiplatelet  ?                          agents. ASA Grade Assessment: III - A patient with  ?                          severe systemic disease. After reviewing the risks  ?                          and benefits, the patient was deemed in  ?                          satisfactory condition to undergo the procedure. ?                          After obtaining informed consent, the colonoscope  ?  was passed under direct vision. Throughout the  ?                          procedure, the patient's blood pressure, pulse, and  ?                          oxygen saturations were monitored continuously. The  ?                          CF [id number] was introduced through the anus  ?                          and advanced to the 4 cm into the ileum. The  ?                          colonoscopy was  performed without difficulty. The  ?                          patient tolerated the procedure well. The quality  ?                          of the bowel preparation was good. The terminal  ?                          ileum, ileocecal valve, appendiceal orifice, and  ?                          rectum were photographed. ?Scope In: 2:33:30 PM ?Scope Out: 2:47:24 PM ?Scope Withdrawal Time: 0 hours 8 minutes 58 seconds  ?Total Procedure Duration: 0 hours 13 minutes 54 seconds  ?Findings:                 The perianal and digital rectal examinations were  ?                          normal. ?                          The colon (entire examined portion) appeared normal. ?                          A few small-mouthed diverticula were found in the  ?                          sigmoid colon. ?                          The exam was otherwise without abnormality on  ?                          direct and retroflexion views including evaluation  ?                          of the terminal ileum. ?Complications:            No immediate complications. ?  Estimated Blood Loss:     Estimated blood loss: none. ?Impression:               - The entire examined colon is normal. ?                          - Sigmoid diverticulosis. ?                          - The examined portion of the ileum was normal. ?                          - The examination was otherwise normal on direct  ?                          and retroflexion views. ?Recommendation:           - Patient has a contact number available for  ?                          emergencies. The signs and symptoms of potential  ?                          delayed complications were discussed with the  ?                          patient. Return to normal activities tomorrow.  ?                          Written discharge instructions were provided to the  ?                          patient. ?                          - Resume previous diet. ?                          - Continue present  medications. ?                          - Repeat colonoscopy at age 21 for screening  ?                          purposes given the family history of colon cancer. ?Thornton Park MD, MD ?01/10/2022 2:55:55 PM ?This report has been signed electronically. ?

## 2022-01-10 NOTE — Progress Notes (Signed)
? ?Referring Provider: Carmel Sacramento, NP ?Primary Care Physician:  Carmel Sacramento, NP ? ?Reason for Procedure:  Iron Deficiency Anemia ? ? ?IMPRESSION:  ?Iron deficiency anemia without known overt or occult bleeding, may be exacerbated by heavy menses that have occurred since her IUD was placed 8/22. Must exclude GI blood loss. ?  ?Family history of colon cancer. Mother at age 33. Will need close surveillance colonoscopies over time.   ? ?Discussed with Dr. Lucia Gaskins, her neurologist, who feels she is an appropriate candidate for monitored anesthesia care in the [facility] today.  ? ?PLAN: ?Colonoscopy in the [facility] today ? ? ?HPI: Tabitha Smith is a 31 y.o. female presents for screening colonoscopy. ? ?She had an embolic stroke 2021 with a hypercoagulable work-up positive for an elevated anticardiolipin.  She also carries a history of CHF, depression, anxiety, adnexal mass, and postoperative nausea and vomiting.  Last echocardiogram 12/21 showed an EF of 55 to 60%. ?  ?Referred by hematology for iron deficiency anemia.  No obvious source for her iron deficiency has been identified.  She has been receiving oral iron and recently started IV iron. ?  ?She reports chronic fatigue.  Pica to ice and blue playdough. Stable appetite but progressive weight gain over the last several months.  No weakness, headache, irritability, exercise intolerance, exertional dyspnea, vertigo, or angina pectoris. No beeturia.  No hearing loss ?  ?No overt GI blood loss. No melena, hematochezia, bright red blood per rectum. Intermittent LLQ pain. No change in bowel habits.  No epistaxis, hemoptysis, or hematuria. She has heavier menstrual periods since IUD placement in 8/22.  However, Mirena was ultimately placed for abnormal uterine bleeding.  ?  ?Baseline bowel habits are 1 formed bowel movement daily. ?  ?No identified exacerbating or relieving features.  ?  ?Had a colonoscopy at [facility] in 2021 given her family history of colon cancer. She remembers  the clean out wasn't good that she attributes to her chronic constipation.  ?  ?Mother with colon cancer diagnosed at age 66 and died 6 weeks later.  There is no other known family history of colon cancer or polyps. No family history of stomach cancer or other GI malignancy. No family history of inflammatory bowel disease or celiac.  ?  ?Labs 11/23/21: hgb 13.3, MCV 89, RDW 14.2, platelets 228 ? ? ?Past Medical History:  ?Diagnosis Date  ? Adnexal mass   ? Anxiety   ? CHF (congestive heart failure) (HCC)   ? Depression   ? History of acute heart failure   ? Hx of septic shock   ? IBS (irritable bowel syndrome)   ? Migraine without aura and without status migrainosus, not intractable 10/26/2021  ? Pneumonia   ? PONV (postoperative nausea and vomiting)   ? Seizures (HCC)   ? Sinusitis   ? Stroke [facility])   ? ? ?Past Surgical History:  ?Procedure Laterality Date  ? BUBBLE STUDY  09/06/2020  ? Procedure: BUBBLE STUDY;  Surgeon: Pricilla Riffle, MD;  Location: [facility] ENDOSCOPY;  Service: Cardiovascular;;  ? DILATION AND CURETTAGE OF UTERUS N/A 06/14/2021  ? Procedure: DILATATION AND CURETTAGE;  Surgeon: Carver Fila, MD;  Location: [facility];  Service: Gynecology;  Laterality: N/A;  ? INTRAUTERINE DEVICE (IUD) INSERTION N/A 06/14/2021  ? Procedure: [place] INTRAUTERINE DEVICE (IUD) INSERTION;  Surgeon: Carver Fila, MD;  Location: [facility];  Service: Gynecology;  Laterality: N/A;  ? NO PAST SURGERIES    ? stitches to abdomen     ?  for a cut   ? TEE WITHOUT CARDIOVERSION N/A 09/06/2020  ? Procedure: TRANSESOPHAGEAL ECHOCARDIOGRAM (TEE);  Surgeon: Pricilla Riffle, MD;  Location[facility] ENDOSCOPY;  Service: Cardiovascular;  Laterality: N/A;  ? ? ?Current Outpatient Medications  ?Medication Sig Dispense Refill  ? aspirin EC 81 MG tablet Take 81 mg by mouth daily. Swallow whole.    ? DULoxetine (CYMBALTA) 60 MG capsule Take 120 mg by mouth daily.    ? levETIRAcetam (KEPPRA XR) 500 MG 24 hr tablet Take 2 tablets (1,000 mg total) by mouth  at bedtime. 60 tablet 3  ? linaclotide (LINZESS) 290 MCG CAPS capsule Take 290 mcg by mouth daily as needed (constipation).    ? prazosin (MINIPRESS) 1 MG capsule Take 1 mg by mouth 2 (two) times daily. Pt works 3rd shift so takes med at [employer] and at bedtime.    ? QUEtiapine Fumarate (SEROQUEL XR) 150 MG 24 hr tablet Take 150 mg by mouth at bedtime.    ? Erenumab-aooe (AIMOVIG) 70 MG/ML SOAJ Inject 70 mg into the skin every 14 (fourteen) days. 2 mL 6  ? ferrous sulfate 325 (65 FE) MG EC tablet Take 1 tablet (325 mg total) by mouth daily with breakfast. 30 tablet 3  ? Ubrogepant (UBRELVY) 100 MG TABS Take 100 mg by mouth every 2 (two) hours as needed. Maximum 200mg  a day. 16 tablet 11  ? zolpidem (AMBIEN) 10 MG tablet Take 10 mg by mouth at bedtime as needed for sleep.    ? ?Current Facility-Administered Medications  ?Medication Dose Route Frequency Provider Last Rate Last Admin  ? 0.9 %  sodium chloride infusion  500 mL Intravenous Once , MD      ? ? ?Allergies as of 01/10/2022 - Review Complete 01/10/2022  ?Allergen Reaction Noted  ? Amoxicillin Anaphylaxis 09/10/2017  ? Bee venom  12/22/2021  ? Penicillins Anaphylaxis 06/19/2016  ? ? ?Family History  ?Problem Relation Age of Onset  ? Diabetes Mother   ? Seizures Mother   ? Cancer Mother   ?     Colon  ? Hypertension Mother   ? Stroke Mother   ? Migraines Neg Hx   ? Headache Neg Hx   ? Breast cancer Neg Hx   ? Ovarian cancer Neg Hx   ? Endometrial cancer Neg Hx   ? Pancreatic cancer Neg Hx   ? Prostate cancer Neg Hx   ? ? ? ?Physical Exam: ?General:   Alert,  well-nourished, pleasant and cooperative in NAD ?Head:  Normocephalic and atraumatic. ?Eyes:  Sclera clear, no icterus.   Conjunctiva pink. ?Mouth:  No deformity or lesions.   ?Neck:  Supple; no masses or thyromegaly. ?Lungs:  Clear throughout to auscultation.   No wheezes. ?Heart:  Regular rate and rhythm; no murmurs. ?Abdomen:  Soft, non-tender, nondistended, normal bowel sounds, no rebound or  guarding.  ?Msk:  Symmetrical. No boney deformities ?LAD: No inguinal or umbilical LAD ?Extremities:  No clubbing or edema. ?Neurologic:  Alert and  oriented x4;  grossly nonfocal ?Skin:  No obvious rash or bruise. ?Psych:  Alert and cooperative. Normal mood and affect. ? ? ? ? ?Studies/Results: ?No results found. ? ? ? ?Benjamen Koelling L. 06/21/2016, MD, MPH ?01/10/2022, 2:18 PM ? ? ? ?  ?

## 2022-01-10 NOTE — Progress Notes (Signed)
Report to PACU, RN, vss, BBS= Clear.  

## 2022-01-10 NOTE — Progress Notes (Signed)
Called to room to assist during endoscopic procedure.  Patient ID and intended procedure confirmed with present staff. Received instructions for my participation in the procedure from the performing physician.  

## 2022-01-10 NOTE — Progress Notes (Signed)
VS  DT ? ?Pt's states no medical or surgical changes since previsit or office visit. ? ?

## 2022-01-10 NOTE — Telephone Encounter (Signed)
-----   Message from Tressia Danas, MD sent at 01/10/2022  3:01 PM EDT ----- ?Regarding: FW: Endoscopy ?Can you suggest a way to save this to the chart?  ? ?Thanks. ? ?KLB ?----- Message ----- ?From: Anson Fret, MD ?Sent: 01/10/2022   2:13 PM EDT ?To: Tressia Danas, MD ?Subject: Endoscopy                                     ? ?Patient is cleared from a neurology perspective for endoscopy. There is a minimal risk of seizure however is very unlikely, I believe the benefits of the procedure outweigh the risks.For any complications please contact me on my cell [phone]. Thanks Dr. Lucia Gaskins Sheralyn Boatman) ? ? ?

## 2022-01-10 NOTE — Op Note (Signed)
[place] ?Patient Name: Tabitha Smith ?Procedure Date: 01/10/2022 1:59 PM ?MRN: [id number] ?Endoscopist: Thornton Park MD, MD ?Age: 31 ?Referring MD:  ?Date of Birth: August 07, 1991 ?Gender: Female ?Account #: [id number] ?Procedure:                Upper GI endoscopy ?Indications:              Unexplained iron deficiency anemia ?Medicines:                Monitored Anesthesia Care ?Procedure:                Pre-Anesthesia Assessment: ?                          - Prior to the procedure, a History and Physical  ?                          was performed, and patient medications and  ?                          allergies were reviewed. The patient's tolerance of  ?                          previous anesthesia was also reviewed. The risks  ?                          and benefits of the procedure and the sedation  ?                          options and risks were discussed with the patient.  ?                          All questions were answered, and informed consent  ?                          was obtained. Prior Anticoagulants: The patient has  ?                          taken no previous anticoagulant or antiplatelet  ?                          agents. ASA Grade Assessment: III - A patient with  ?                          severe systemic disease. After reviewing the risks  ?                          and benefits, the patient was deemed in  ?                          satisfactory condition to undergo the procedure. ?                          After obtaining informed consent, the endoscope was  ?  passed under direct vision. Throughout the  ?                          procedure, the patient's blood pressure, pulse, and  ?                          oxygen saturations were monitored continuously. The  ?                          GIF [id number] was introduced through the  ?                          mouth, and advanced to the third part of duodenum.  ?                          The upper GI  endoscopy was accomplished without  ?                          difficulty. The patient tolerated the procedure  ?                          well. ?Scope In: ?Scope Out: ?Findings:                 The examined esophagus was normal. ?                          Localized moderate inflammation characterized by  ?                          erythema, friability and granularity was found in  ?                          the gastric fundus and scattered throughout the  ?                          gastric body. Biopsies were taken from the antrum,  ?                          body, and fundus with a cold forceps for histology.  ?                          Estimated blood loss was minimal. ?                          Multiple erosions without bleeding were found in  ?                          the duodenal bulb. Biopsies were taken with a cold  ?                          forceps for histology. Estimated blood loss was  ?                          minimal. ?  The exam was otherwise without abnormality. ?Complications:            No immediate complications. Estimated blood loss:  ?                          Minimal. ?Estimated Blood Loss:     Estimated blood loss was minimal. ?Impression:               - Normal esophagus. ?                          - Gastritis. Biopsied. ?                          - Duodenal erosions without bleeding. Biopsied. ?                          - The examination was otherwise normal. ?Recommendation:           - Patient has a contact number available for  ?                          emergencies. The signs and symptoms of potential  ?                          delayed complications were discussed with the  ?                          patient. Return to normal activities tomorrow.  ?                          Written discharge instructions were provided to the  ?                          patient. ?                          - Resume previous diet. ?                          - Continue present  medications. ?                          - Pantoprazole 40 mg BID x 10 weeks. ?                          - Await pathology results. ?                          - No aspirin, ibuprofen, naproxen, or other  ?                          non-steroidal anti-inflammatory drugs. ?Thornton Park MD, MD ?01/10/2022 2:53:09 PM ?This report has been signed electronically. ?

## 2022-01-10 NOTE — Telephone Encounter (Signed)
Neurology clearance received. ?

## 2022-01-10 NOTE — Patient Instructions (Signed)
Thank you for letting us take care of your healthcare needs today. ?Please see handouts given to you on Gastritis. ?New Prescription for Pantoprazole sent to you Pharmacy. ?NO ASA, Ibuprofen, Advil or Motrin.  ? ? ? ?YOU HAD AN ENDOSCOPIC PROCEDURE TODAY AT THE  ENDOSCOPY CENTER:   Refer to the procedure report that was given to you for any specific questions about what was found during the examination.  If the procedure report does not answer your questions, please call your gastroenterologist to clarify.  If you requested that your care partner not be given the details of your procedure findings, then the procedure report has been included in a sealed envelope for you to review at your convenience later. ? ?YOU SHOULD EXPECT: Some feelings of bloating in the abdomen. Passage of more gas than usual.  Walking can help get rid of the air that was put into your GI tract during the procedure and reduce the bloating. If you had a lower endoscopy (such as a colonoscopy or flexible sigmoidoscopy) you may notice spotting of blood in your stool or on the toilet paper. If you underwent a bowel prep for your procedure, you may not have a normal bowel movement for a few days. ? ?Please Note:  You might notice some irritation and congestion in your nose or some drainage.  This is from the oxygen used during your procedure.  There is no need for concern and it should clear up in a day or so. ? ?SYMPTOMS TO REPORT IMMEDIATELY: ? ?Following lower endoscopy (colonoscopy or flexible sigmoidoscopy): ? Excessive amounts of blood in the stool ? Significant tenderness or worsening of abdominal pains ? Swelling of the abdomen that is new, acute ? Fever of 100?F or higher ? ?Following upper endoscopy (EGD) ? Vomiting of blood or coffee ground material ? New chest pain or pain under the shoulder blades ? Painful or persistently difficult swallowing ? New shortness of breath ? Fever of 100?F or higher ? Black, tarry-looking  stools ? ?For urgent or emergent issues, a gastroenterologist can be reached at any hour by calling [phone]. ?Do not use MyChart messaging for urgent concerns.  ? ? ?DIET:  We do recommend a small meal at first, but then you may proceed to your regular diet.  Drink plenty of fluids but you should avoid alcoholic beverages for 24 hours. ? ?ACTIVITY:  You should plan to take it easy for the rest of today and you should NOT DRIVE or use heavy machinery until tomorrow (because of the sedation medicines used during the test).   ? ?FOLLOW UP: ?Our staff will call the number listed on your records 48-72 hours following your procedure to check on you and address any questions or concerns that you may have regarding the information given to you following your procedure. If we do not reach you, we will leave a message.  We will attempt to reach you two times.  During this call, we will ask if you have developed any symptoms of COVID 19. If you develop any symptoms (ie: fever, flu-like symptoms, shortness of breath, cough etc.) before then, please call [phone].  If you test positive for Covid 19 in the 2 weeks post procedure, please call and report this information to [place].   ? ?If any biopsies were taken you will be contacted by phone or by letter within the next 1-3 weeks.  Please call us at [phone] if you have not heard about the biopsies in  3 weeks.  ? ? ?SIGNATURES/CONFIDENTIALITY: ?You and/or your care partner have signed paperwork which will be entered into your electronic medical record.  These signatures attest to the fact that that the information above on your After Visit Summary has been reviewed and is understood.  Full responsibility of the confidentiality of this discharge information lies with you and/or your care-partner.  ?

## 2022-01-12 ENCOUNTER — Telehealth: Payer: Self-pay | Admitting: *Deleted

## 2022-01-12 ENCOUNTER — Encounter: Payer: Self-pay | Admitting: Neurology

## 2022-01-12 NOTE — Telephone Encounter (Signed)
Pt last seen 12-21-2021.  On Keppra xr 1000mg  daily (evening).  No skipped doses.  See mychart message.  Please advise.  ?

## 2022-01-12 NOTE — Telephone Encounter (Signed)
?  Follow up Call- ? ?Call back number 01/10/2022  ?Post procedure Call Back phone  # [phone]  ?Permission to leave phone message Yes  ?Some recent data might be hidden  ?  ? ?Patient questions: ?Message left to call us if necessary. ?

## 2022-01-12 NOTE — Telephone Encounter (Signed)
?  Follow up Call- ? ?Call back number 01/10/2022  ?Post procedure Call Back phone  # [phone]  ?Permission to leave phone message Yes  ?Some recent data might be hidden  ?  ? ?Patient questions: ? ?Do you have a fever, pain , or abdominal swelling? Yes.   ?Pain Score  2 * ? ?Have you tolerated food without any problems? Yes.   ? ?Have you been able to return to your normal activities? Yes.   ? ?Do you have any questions about your discharge instructions: ?Diet   No. ?Medications  No. ?Follow up visit  No. ? ?Do you have questions or concerns about your Care? No. ? ?Actions: ?* If pain score is 4 or above: ?No action needed, pain <4. ? ? ?

## 2022-01-23 ENCOUNTER — Other Ambulatory Visit: Payer: Self-pay | Admitting: Neurology

## 2022-01-23 DIAGNOSIS — R569 Unspecified convulsions: Secondary | ICD-10-CM

## 2022-01-24 ENCOUNTER — Encounter: Payer: Self-pay | Admitting: *Deleted

## 2022-01-25 ENCOUNTER — Telehealth: Payer: Self-pay | Admitting: Neurology

## 2022-01-25 NOTE — Telephone Encounter (Signed)
Referral sent to Dr. Ernest Haber @ [phone]. ?

## 2022-01-26 ENCOUNTER — Encounter: Payer: Self-pay | Admitting: Gastroenterology

## 2022-01-26 NOTE — Telephone Encounter (Signed)
PT RESCHEDULED VEEG  Set up date 02/08/2022  ?

## 2022-01-31 NOTE — Telephone Encounter (Signed)
?  Follow up Call- ? ? ?  01/10/2022  ?  1:23 PM  ?Call back number  ?Post procedure Call Back phone  # [phone]  ?Permission to leave phone message Yes  ?  ? ?Patient questions: ? ?Do you have a fever, pain , or abdominal swelling? No. ?Pain Score  0 * ? ?Have you tolerated food without any problems? Yes.   ? ?Have you been able to return to your normal activities? Yes.   ? ?Do you have any questions about your discharge instructions: ?Diet   No. ?Medications  No. ?Follow up visit  No. ? ?Do you have questions or concerns about your Care? No. ? ?Actions: ?* If pain score is 4 or above: ?No action needed, pain <4. ? ? ?

## 2022-02-01 ENCOUNTER — Other Ambulatory Visit: Payer: Self-pay

## 2022-02-01 ENCOUNTER — Telehealth: Payer: Self-pay | Admitting: Gastroenterology

## 2022-02-01 DIAGNOSIS — R112 Nausea with vomiting, unspecified: Secondary | ICD-10-CM

## 2022-02-01 NOTE — Telephone Encounter (Signed)
Does not appear diagnostic imaging or labs had been obtained at the time of visit, but rather urine pregnancy test and UA. Will plan to schedule pt for an appt with APP. However, routing this message to Dr. Orvan Falconer to determine what, if any, testing needs to be ordered prior to OV.  ? ?From Urgent Care visit: ? ?Patient presents with  ? Emesis  ?For 2 weeks  ? ?31 year old female complaining of a 10-day history of persistent, recurrent nausea and vomiting episodes. Usually starts not long after she gets up in the mornings and starts moving around. She has had intermittent dizziness with it. She has a prior history of vertigo. She denies any abdominal pain. She denies any dysuria, urinary frequency, urinary urgency or flank pain. She denies any fever. She denies any headache, visual changes, numbness/tingling, or weakness. She denies any risk for pregnancy as she is homosexual. Her appetite has been normal. She has been having normal bowel movements and denies constipation or diarrhea. She sometimes has nausea and vomiting after she eats but not always. She denies any hematemesis or coffee-ground emesis. Promethazine does not help as she throws it back up soon after she takes it. Zofran has helped some but she is out of it. She recently had a colonoscopy and EGD done with gastroenterology a little over 2 weeks ago because of iron deficiency anemia. The studies found no specific sources of GI blood loss or acute gastritis. She was started on pantoprazole 40 mg twice daily at that time. She was not having issues with nausea and vomiting at that time. ? ?Impression  ? ?1. Nausea and vomiting, unspecified vomiting type  ?2. Lower abdominal pain  ? ?Plan  ?She will increase her fluid intake by sipping on fluids in small amounts on a regular basis and not trying to drink large quantities of anything at 1 time. She will use the generic Zofran as needed for nausea and vomiting and the meclizine as needed for dizziness. She  will follow-up with her gastroenterologist regarding the symptoms as soon as possible. She agrees that she will go immediately to the emergency department for further evaluation if she experiences worsening nausea and vomiting, is unable to keep anything down, worsening abdominal pain, or fever. Otherwise she will follow-up with her primary care provider if not improving. ? ?New Prescriptions  ?Instructions  ?meclizine HCl 25 mg tablet ?Commonly known as: ANTIVERT ?25 mg, Oral, 3 times a day as needed ? ?ondansetron 8 mg disintegrating tablet ?Commonly known as: ZOFRAN-ODT ?8 mg, Oral, Every 8 hours as needed ?

## 2022-02-01 NOTE — Telephone Encounter (Signed)
Orders placed per Dr. Orvan Falconer request. Reminder created to ensure pt has completed labs and imaging in a timely manner. Called pt and informed of Dr. Orvan Falconer response below. Verbalized acceptance and understanding. Requested I send her a My Chart message with contact # so she can self scheduled [place]. Pt also states she will keep appt as scheduled in May. ?

## 2022-02-01 NOTE — Telephone Encounter (Signed)
Patient called complaining that ever since her procedure all she does is vomit.  When she gets up in the morning, she vomits.  If she drinks or eats, she vomits.  She went to urgent care yesterday and they have referred her back to Dr. Orvan Falconer.  Her first appt was 5/4.  Patient wanted to know what she should do in the meantime or if she might possibly be seen sooner than that.  Please call patient and advise.  Thank you. ?

## 2022-02-07 NOTE — Telephone Encounter (Signed)
At the time of this entry, pt chart was reviewed. Labs and [place] not yet completed. Called pt with reminder to schedule [place] and to ensure labs are completed. States she intends to be in [place] tomorrow. Will complete labs then. States she still has contact # and will call to schedule [place]. F/u efforts continue at this time. ?

## 2022-02-09 NOTE — Telephone Encounter (Signed)
SECOND ATTEMPT:  ? ?At the time of this entry, pt still has not scheduled [place] nor completed labs as requested. Again, called pt to remind to complete orders as requested. LVM requesting returned call. ?

## 2022-02-10 ENCOUNTER — Encounter: Payer: Self-pay | Admitting: Gastroenterology

## 2022-02-10 NOTE — Telephone Encounter (Signed)
FINAL ATTEMPT: ? ?At the time of this entry, pt still has not scheduled [place]. Does appear CBC and CMP have been completed (found in Care Everywhere). Discussed results with Dr. Orvan Falconer. Pt will not need to complete CBC and CMP but does require Lipase to be completed. Orders updated to reflect Dr. Orvan Falconer request. Again called pt to remind to schedule [place] and to complete labs. LVM requesting returned call. Letter mailed to pt home address and My Chart message sent. Pt is scheduled for f/u 03/02/22. Routing this message to Dr. Orvan Falconer to make her aware of failed efforts at this time. ?

## 2022-02-10 NOTE — Telephone Encounter (Signed)
Noted. Thanks.

## 2022-02-13 ENCOUNTER — Telehealth: Payer: Self-pay | Admitting: Hematology and Oncology

## 2022-02-13 ENCOUNTER — Encounter: Payer: Self-pay | Admitting: Neurology

## 2022-02-13 NOTE — Telephone Encounter (Signed)
Called patient regarding upcoming appointments, left a voicemail. 

## 2022-02-13 NOTE — Telephone Encounter (Addendum)
Made appointment for patient with Amy Lomax,NP  for right arm weakness . Pt went to ED wanted patient  to follow up with our office  ?Will forward to Dr Jaynee Eagles and Amy lomax,NP to make them aware  ?
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
## 2022-02-15 ENCOUNTER — Encounter: Payer: Self-pay | Admitting: Family Medicine

## 2022-02-15 ENCOUNTER — Other Ambulatory Visit (INDEPENDENT_AMBULATORY_CARE_PROVIDER_SITE_OTHER): Payer: Medicaid Other

## 2022-02-15 ENCOUNTER — Ambulatory Visit: Payer: Medicaid Other | Admitting: Family Medicine

## 2022-02-15 VITALS — BP 121/76 | HR 85 | Ht 65.0 in | Wt 193.0 lb

## 2022-02-15 DIAGNOSIS — G43009 Migraine without aura, not intractable, without status migrainosus: Secondary | ICD-10-CM

## 2022-02-15 DIAGNOSIS — R202 Paresthesia of skin: Secondary | ICD-10-CM

## 2022-02-15 DIAGNOSIS — R569 Unspecified convulsions: Secondary | ICD-10-CM

## 2022-02-15 DIAGNOSIS — R112 Nausea with vomiting, unspecified: Secondary | ICD-10-CM

## 2022-02-15 DIAGNOSIS — R200 Anesthesia of skin: Secondary | ICD-10-CM

## 2022-02-15 DIAGNOSIS — R419 Unspecified symptoms and signs involving cognitive functions and awareness: Secondary | ICD-10-CM | POA: Diagnosis not present

## 2022-02-15 HISTORY — DX: Anesthesia of skin: R20.2

## 2022-02-15 HISTORY — DX: Paresthesia of skin: R20.0

## 2022-02-15 LAB — LIPASE: Lipase: 39 U/L (ref 11.0–59.0)

## 2022-02-15 NOTE — Patient Instructions (Addendum)
Below is our plan: ? ?We will continue levetiracetam XR 1000mg  daily. Continue Amovig 70mg  every 30 days and Ubrelvy as needed. Reach out to your psychiatrist to discuss your treatment plan.  ? ?Please make sure you are consistent with timing of seizure medication. I recommend annual visit with primary care provider (PCP) for complete physical and routine blood work. We will monitor vitamin D level. I recommend daily intake of vitamin D (400-800iu) and calcium (800-1000mg ) for bone health. Discuss Dexa screening with PCP.  ? ?According to [place] law, you can not drive unless you are seizure / syncope free for at least 6 months and under physician's care. ? ?Please maintain precautions. Do not participate in activities where a loss of awareness could harm you or someone else. No swimming alone, no tub bathing, no hot tubs, no driving, no operating motorized vehicles (cars, ATVs, motocycles, etc), lawnmowers, power tools or firearms. No standing at heights, such as rooftops, ladders or stairs. Avoid hot objects such as stoves, heaters, open fires. Wear a helmet when riding a bicycle, scooter, skateboard, etc. and avoid areas of traffic. Set your water heater to 120 degrees or less.  ? ?Please make sure you are staying well hydrated. I recommend 50-60 ounces daily. Well balanced diet and regular exercise encouraged. Consistent sleep schedule with 6-8 hours recommended.  ? ?Please continue follow up with care team as directed.  ? ?Follow up with Dr as directed pending evaluation with Dr  ? ?You may receive a survey regarding today's visit. I encourage you to leave honest feed back as I do use this information to improve patient care. Thank you for seeing me today!  ? ? ?

## 2022-02-18 ENCOUNTER — Telehealth: Payer: Self-pay | Admitting: Gastroenterology

## 2022-02-18 NOTE — Telephone Encounter (Signed)
Review of outside records from [facility] ? ?Colonoscopy with Dr. Shary Decamp 03/23/2020 for a family history of colon cancer in a distant relative and constipation.  The prep was inadequate.  Medium sized internal hemorrhoids were seen.  Repeat colonoscopy recommended in 2 years given the limited prep. ?

## 2022-02-22 ENCOUNTER — Ambulatory Visit (HOSPITAL_COMMUNITY)
Admission: RE | Admit: 2022-02-22 | Discharge: 2022-02-22 | Disposition: A | Discharge status: Home / Self Care | Payer: Medicaid Other | Source: Ambulatory Visit | Attending: Gastroenterology | Admitting: Gastroenterology

## 2022-02-22 DIAGNOSIS — R112 Nausea with vomiting, unspecified: Secondary | ICD-10-CM | POA: Insufficient documentation

## 2022-03-01 ENCOUNTER — Telehealth: Payer: Self-pay | Admitting: *Deleted

## 2022-03-01 NOTE — Telephone Encounter (Signed)
Received video EEG results from [facility]. Full report sent to medical records to be scanned.  ? ?EEG was from April 12-15, 2023.  ? ? ?Events ?The patient logged 0 events and there were 0 "patient event" button pushes noted.  ? ?Event #1- 02/08/22 at 2200 -  button not pushed. Patient logged: "slight migraine- took [place]"; the patient is not seen on camera. There were no clinical or EEG correlations noted.  ? ?EKG ?EKG was regular with  a heart rate of 66 bpm with no arrhythmias noted.  ? ?Physician Conclusion/Impression: ?This is a normal awake and asleep EEG.  No focal or lateralizing abnormalities or epileptiform discharges are noted.  One patient event was present with no seizure activity during that time. ? ?Maureen Ralphs MD 02/26/22 ? ?

## 2022-03-02 ENCOUNTER — Ambulatory Visit: Payer: Medicaid Other | Admitting: Gastroenterology

## 2022-03-03 ENCOUNTER — Encounter: Payer: Self-pay | Admitting: Neurology

## 2022-03-15 ENCOUNTER — Other Ambulatory Visit: Payer: Self-pay | Admitting: Obstetrics & Gynecology

## 2022-03-15 NOTE — Telephone Encounter (Signed)
I called pt. She stated that she gave me name of wrong form (it is workplace accomodation form and not STD form.  She says she has taken hit for all previous times but this would be going forward.  Per her work with [employer] (she works from home but cannot have any video or monitoring device (not even medical) due to the sensitive information she works with (this even means cannot have her cellphone ,alexa, or siri in vicinity of where she works).  She works Friday thru [employer]. If she has sz/episode and has to be out any of those days then can make up on another day and not be penalized.  I told her to DL or attach the form to mychart and then will have Dr. Lucia Gaskins look at it.  She has appt with Dr. Logan Bores w/[facility] in 03/2022 EMU Admission.  I asked her how many sz she is having but could not give me at the time as had another appt and provider walked in.  ?

## 2022-03-20 NOTE — Telephone Encounter (Signed)
Received Accomodation form.  In your inbox pod 4.

## 2022-03-24 ENCOUNTER — Other Ambulatory Visit: Payer: Self-pay | Admitting: Obstetrics & Gynecology

## 2022-03-29 ENCOUNTER — Inpatient Hospital Stay: Payer: Medicaid Other | Attending: Hematology and Oncology

## 2022-03-29 ENCOUNTER — Inpatient Hospital Stay (HOSPITAL_BASED_OUTPATIENT_CLINIC_OR_DEPARTMENT_OTHER): Payer: Medicaid Other | Admitting: Hematology and Oncology

## 2022-03-29 ENCOUNTER — Other Ambulatory Visit: Payer: Self-pay | Admitting: Hematology and Oncology

## 2022-03-29 ENCOUNTER — Other Ambulatory Visit: Payer: Self-pay

## 2022-03-29 VITALS — BP 129/77 | HR 65 | Temp 97.6°F | Resp 17 | Ht 65.0 in | Wt 188.5 lb

## 2022-03-29 DIAGNOSIS — D509 Iron deficiency anemia, unspecified: Secondary | ICD-10-CM | POA: Insufficient documentation

## 2022-03-29 DIAGNOSIS — Z79899 Other long term (current) drug therapy: Secondary | ICD-10-CM | POA: Diagnosis not present

## 2022-03-29 DIAGNOSIS — R569 Unspecified convulsions: Secondary | ICD-10-CM | POA: Insufficient documentation

## 2022-03-29 DIAGNOSIS — R768 Other specified abnormal immunological findings in serum: Secondary | ICD-10-CM | POA: Insufficient documentation

## 2022-03-29 DIAGNOSIS — F17210 Nicotine dependence, cigarettes, uncomplicated: Secondary | ICD-10-CM | POA: Insufficient documentation

## 2022-03-29 DIAGNOSIS — Z8673 Personal history of transient ischemic attack (TIA), and cerebral infarction without residual deficits: Secondary | ICD-10-CM | POA: Insufficient documentation

## 2022-03-29 LAB — RETIC PANEL
Immature Retic Fract: 17.4 % — ABNORMAL HIGH (ref 2.3–15.9)
RBC.: 4.6 MIL/uL (ref 3.87–5.11)
Retic Count, Absolute: 88.3 10*3/uL (ref 19.0–186.0)
Retic Ct Pct: 1.9 % (ref 0.4–3.1)
Reticulocyte Hemoglobin: 35.7 pg (ref >27.9)

## 2022-03-29 LAB — CMP (CANCER CENTER ONLY)
ALT: 15 U/L (ref 0–44)
AST: 20 U/L (ref 15–41)
Albumin: 4.1 g/dL (ref 3.5–5.0)
Alkaline Phosphatase: 51 U/L (ref 38–126)
Anion gap: 8 (ref 5–15)
BUN: 7 mg/dL (ref 6–20)
CO2: 24 mmol/L (ref 22–32)
Calcium: 9.2 mg/dL (ref 8.9–10.3)
Chloride: 108 mmol/L (ref 98–111)
Creatinine: 1.03 mg/dL — ABNORMAL HIGH (ref 0.44–1.00)
GFR, Estimated: >60 mL/min (ref >60)
Glucose, Bld: 116 mg/dL — ABNORMAL HIGH (ref 70–99)
Potassium: 3.7 mmol/L (ref 3.5–5.1)
Sodium: 140 mmol/L (ref 135–145)
Total Bilirubin: 0.4 mg/dL (ref 0.3–1.2)
Total Protein: 7.5 g/dL (ref 6.5–8.1)

## 2022-03-29 LAB — CBC WITH DIFFERENTIAL (CANCER CENTER ONLY)
Abs Immature Granulocytes: 0.02 10*3/uL (ref 0.00–0.07)
Basophils Absolute: 0 10*3/uL (ref 0.0–0.1)
Basophils Relative: 0 %
Eosinophils Absolute: 0.1 10*3/uL (ref 0.0–0.5)
Eosinophils Relative: 1 %
HCT: 42.1 % (ref 36.0–46.0)
Hemoglobin: 14.3 g/dL (ref 12.0–15.0)
Immature Granulocytes: 0 %
Lymphocytes Relative: 24 %
Lymphs Abs: 2 10*3/uL (ref 0.7–4.0)
MCH: 31.3 pg (ref 26.0–34.0)
MCHC: 34 g/dL (ref 30.0–36.0)
MCV: 92.1 fL (ref 80.0–100.0)
Monocytes Absolute: 0.6 10*3/uL (ref 0.1–1.0)
Monocytes Relative: 7 %
Neutro Abs: 5.7 10*3/uL (ref 1.7–7.7)
Neutrophils Relative %: 68 %
Platelet Count: 259 10*3/uL (ref 150–400)
RBC: 4.57 MIL/uL (ref 3.87–5.11)
RDW: 13.2 % (ref 11.5–15.5)
WBC Count: 8.4 10*3/uL (ref 4.0–10.5)
nRBC: 0 % (ref 0.0–0.2)

## 2022-03-29 LAB — IRON AND IRON BINDING CAPACITY (CC-WL,HP ONLY)
Iron: 50 ug/dL (ref 28–170)
Saturation Ratios: 15 % (ref 10.4–31.8)
TIBC: 332 ug/dL (ref 250–450)
UIBC: 282 ug/dL

## 2022-03-29 LAB — FERRITIN: Ferritin: 70 ng/mL (ref 11–307)

## 2022-03-29 NOTE — Progress Notes (Signed)
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 Hypercoagulable workup was positive for mild elevation of anticardiolipin Igm at 19 and decreased protein S activity at 37%.  2) 05/26/2021: Repeat cardiolipin antibody panel revealed mild increase in anticardiolipin IgM at 28.  3) 06/16/2021: Establish care with [name] PA-C  4) 10/05/2021-10/12/2021: Received IV Feraheme 510 mg x 2 doses  HISTORY OF PRESENTING ILLNESS:  Tabitha Smith 31 y.o. female returns for follow-up for iron deficiency anemia.   On exam today, Tabitha Smith reports she has been sleeping more but getting a poor quality sleep.  She reports that today she feels quite wiped out already and is ready to go back to sleep.  She notes that she is not eating as much iron rich foods as she would like.  She stopped taking iron pills because was causing too much constipation.  She reports that she is not having any bleeding or bruising anywhere and that her menstrual cycles continue to be "random".  She notes that she has not endometrial ablation planned for next month.  She reports her last cycle was around the time of [place].  She reports unfortunately she did have more seizures in the interim since her last visit and is currently taking Keppra 1000 mg nightly.  Otherwise she has not noted any bright red blood per rectum or in her urine.  She denies any fevers, chills, night sweats, shortness of breath, chest pain, cough. She has no other complaints. Rest of the 10 point ROS is below.   MEDICAL HISTORY:   Past Medical History:  Diagnosis Date   Adnexal mass    Anxiety    CHF (congestive heart failure) ([place])    Depression    History of acute heart failure    Hx of septic shock    IBS (irritable bowel syndrome)    Migraine without aura and without status migrainosus, not intractable 10/26/2021   Pneumonia    PONV (postoperative nausea and vomiting)    Seizures ([place])    Sinusitis    Stroke [facility])     SURGICAL HISTORY: Past Surgical History:  Procedure Laterality Date   BUBBLE STUDY  09/06/2020   Procedure: BUBBLE STUDY;  Surgeon: Fay Records, MD;  Location: [facility];  Service: Cardiovascular;;   DILATION AND CURETTAGE OF UTERUS N/A 06/14/2021   Procedure: DILATATION AND CURETTAGE;  Surgeon: Lafonda Mosses, MD;  Location: [facility];  Service: Gynecology;  Laterality: N/A;   INTRAUTERINE DEVICE (IUD) INSERTION N/A 06/14/2021   Procedure: [place] (IUD) INSERTION;  Surgeon: Lafonda Mosses, MD;  Location: [facility];  Service: Gynecology;  Laterality: N/A;   NO PAST SURGERIES     stitches to abdomen      for a cut    TEE WITHOUT CARDIOVERSION N/A 09/06/2020   Procedure: TRANSESOPHAGEAL ECHOCARDIOGRAM (TEE);  Surgeon: Fay Records, MD;  Location: [facility];  Service: Cardiovascular;  Laterality: N/A;    SOCIAL HISTORY: Social History   Socioeconomic History   Marital status: Single    Spouse name: Not on file   Number of children: 1   Years of  education: Not on file   Highest education level: High school graduate  Occupational History   Not on file  Tobacco Use   Smoking status: Every Day    Packs/day: 0.25    Types: Cigarettes   Smokeless tobacco: Never  Vaping Use   Vaping Use: Never used  Substance and Sexual Activity   Alcohol use: Yes    Comment: occasionally   Drug use: Never   Sexual activity: Yes    Birth control/protection: I.U.D.  Other Topics Concern   Not on file  Social History Narrative   Lives at home with family   Left  handed   Caffeine: coffee, 2-4 cups/day   Social Determinants of Health   Financial Resource Strain: Not on file  Food Insecurity: Not on file  Transportation Needs: Not on file  Physical Activity: Not on file  Stress: Not on file  Social Connections: Not on file  Intimate Partner Violence: Not on file    FAMILY HISTORY: Family History  Problem Relation Age of Onset   Diabetes Mother    Seizures Mother    Cancer Mother        Colon   Hypertension Mother    Stroke Mother    Migraines Neg Hx    Headache Neg Hx    Breast cancer Neg Hx    Ovarian cancer Neg Hx    Endometrial cancer Neg Hx    Pancreatic cancer Neg Hx    Prostate cancer Neg Hx     ALLERGIES:  is allergic to amoxicillin, bee venom, and penicillins.  MEDICATIONS:  Current Outpatient Medications  Medication Sig Dispense Refill   Erenumab-aooe (AIMOVIG) 70 MG/ML SOAJ Inject 70 mg into the skin every 14 (fourteen) days. 2 mL 6   levETIRAcetam (KEPPRA XR) 500 MG 24 hr tablet Take 2 tablets (1,000 mg total) by mouth at bedtime. 60 tablet 3   linaclotide (LINZESS) 290 MCG CAPS capsule Take 290 mcg by mouth daily as needed (constipation).     pantoprazole (PROTONIX) 40 MG tablet Take 1 tablet (40 mg total) by mouth 2 (two) times daily before a meal. 60 tablet 2   Ubrogepant (UBRELVY) 100 MG TABS Take 100 mg by mouth every 2 (two) hours as needed. Maximum 238m a day. 16 tablet 11   zolpidem (AMBIEN) 10 MG tablet Take 10 mg by mouth at bedtime as needed for sleep.     No current facility-administered medications for this visit.    REVIEW OF SYSTEMS:   Constitutional: ( - ) fevers, ( - )  chills , ( - ) night sweats Eyes: ( - ) blurriness of vision, ( - ) double vision, ( - ) watery eyes Ears, nose, mouth, throat, and face: ( - ) mucositis, ( - ) sore throat Respiratory: ( - ) cough, ( - ) dyspnea, ( - ) wheezes Cardiovascular: ( - ) palpitation, ( - ) chest discomfort, ( - ) lower extremity  swelling Gastrointestinal:  ( - ) nausea, ( - ) heartburn, ( - ) change in bowel habits Skin: ( - ) abnormal skin rashes Lymphatics: ( - ) new lymphadenopathy, ( - ) easy bruising Neurological: ( - ) numbness, ( - ) tingling, ( - ) new weaknesses Behavioral/Psych: ( - ) mood change, ( - ) new changes  All other systems were reviewed with the patient and are negative.  PHYSICAL EXAMINATION: ECOG PERFORMANCE STATUS: 1 - Symptomatic but completely ambulatory  Vitals:   03/29/22 1414  BP:  129/77  Pulse: 65  Resp: 17  Temp: 97.6 F (36.4 C)  SpO2: 100%   Filed Weights   03/29/22 1414  Weight: 188 lb 8 oz (85.5 kg)    GENERAL: well appearing African American female in NAD  SKIN: skin color, texture, turgor are normal, no rashes or significant lesions EYES: conjunctiva are pink and non-injected, sclera clear LUNGS: clear to auscultation and percussion with normal breathing effort HEART: regular rate & rhythm and no murmurs and no lower extremity edema ABDOMEN: soft, non-tender, non-distended, normal bowel sounds Musculoskeletal: no cyanosis of digits and no clubbing  PSYCH: alert & oriented x 3, fluent speech NEURO: no focal motor/sensory deficits  LABORATORY DATA:  I have reviewed the data as listed    Latest Ref Rng & Units 03/29/2022    1:52 PM 11/23/2021    3:08 PM 09/27/2021    2:48 PM  CBC  WBC 4.0 - 10.5 K/uL 8.4   8.8   10.0    Hemoglobin 12.0 - 15.0 g/dL 14.3   13.3   12.5    Hematocrit 36.0 - 46.0 % 42.1   38.9   36.4    Platelets 150 - 400 K/uL 259   228   284         Latest Ref Rng & Units 03/29/2022    1:52 PM 10/26/2021   10:01 AM 06/08/2021   11:25 AM  CMP  Glucose 70 - 99 mg/dL 116   87   85    BUN 6 - 20 mg/dL _0 Creatinine 0.44 - 1.00 mg/dL 1.03   1.01   0.90    Sodium 135 - 145 mmol/L 140   139   141    Potassium 3.5 - 5.1 mmol/L 3.7   3.9   3.7    Chloride 98 - 111 mmol/L 108   101   110    CO2 22 - 32 mmol/L _1 Calcium  8.9 - 10.3 mg/dL 9.2   9.5   8.9    Total Protein 6.5 - 8.1 g/dL 7.5      Total Bilirubin 0.3 - 1.2 mg/dL 0.4      Alkaline Phos 38 - 126 U/L 51      AST 15 - 41 U/L 20      ALT 0 - 44 U/L 15        ASSESSMENT & PLAN Tabitha Smith is a 31 y.o. female returns for a follow up for iron deficiency anemia.   #Iron deficiency anemia 2/2 to GYN Bleeding: -- Not currently on p.o. iron therapy. --Received IV feraheme 510 mg x 2 doses on 10/05/2021 and 10/12/2021: --Since patient reports very light menstrual periods, made referral to GI to rule out malabsorption versus GI bleeding.  Patient has met with GI and establish care with Dr. Tarri Glenn.  Underwent colonoscopy with adequate prep.  Plan for repeat colonoscopy in 2 years time. --Labs today show anemia has resolved with Hgb 14.3. Iron panel shows serum iron 50, TIB 332, saturation 15% and ferritin pending  --RTC in 6 months with repeat labs  #Elevated anticardiolipin IgM level: --Reviewed most recent evated anticardiolipin IgM level of 28. Prior level was 19.  --Does not meet criteria for antiphospholipid antibody syndrome --No further intervention is required.   #Decreased protein S activity: --Decreased to 37% in November 2021 when admitted for CVA --Repeat Protein S level  and activity was checked on 06/16/2021 and was within normal limits.  --No further intervention is required.   #Fatigue: --Advised to follow up with PCP to evaluate for other causes of fatigue.  --Potentially some as result of side effect of antiseizure medications.  No orders of the defined types were placed in this encounter.   All questions were answered. The patient knows to call the clinic with any problems, questions or concerns.  I have spent a total of 30 minutes minutes of face-to-face and non-face-to-face time, preparing to see the patient, obtaining and/or reviewing separately obtained history, performing a medically appropriate examination, counseling and  educating the patient, ordering medications,  documenting clinical information in the electronic health record, and care coordination.   Ledell Peoples, MD Department of Hematology/Oncology [place] at [facility] Phone: [phone] Pager: [phone] Email: [email or web address]

## 2022-03-30 ENCOUNTER — Telehealth: Payer: Self-pay | Admitting: Hematology and Oncology

## 2022-03-30 NOTE — Telephone Encounter (Signed)
Per 5/31 los called and left message for pt about appointment  details and call back number were left

## 2022-04-04 ENCOUNTER — Ambulatory Visit: Payer: Medicaid Other | Admitting: Gastroenterology

## 2022-04-16 ENCOUNTER — Other Ambulatory Visit: Payer: Self-pay | Admitting: Neurology

## 2022-04-27 ENCOUNTER — Telehealth (INDEPENDENT_AMBULATORY_CARE_PROVIDER_SITE_OTHER): Payer: Medicaid Other | Admitting: Neurology

## 2022-04-27 DIAGNOSIS — R569 Unspecified convulsions: Secondary | ICD-10-CM

## 2022-04-27 NOTE — Patient Instructions (Signed)
Try to take Keppra 500mg  XR twice daily

## 2022-04-27 NOTE — Progress Notes (Addendum)
[name]    Provider:  Dr Lucia Gaskins Referring Provider: Carmel Sacramento, NP Primary Care Physician:  [facility]  Virtual Visit via Video Note  I connected with Tabitha Smith on 04/27/2022 at  3:30 PM EDT by a video enabled telemedicine application and verified that I am speaking with the correct person using two identifiers.  Location: Patient: home Provider: office   I discussed the limitations of evaluation and management by telemedicine and the availability of in person appointments. The patient expressed understanding and agreed to proceed.    Follow Up Instructions:    I discussed the assessment and treatment plan with the patient. The patient was provided an opportunity to ask questions and all were answered. The patient agreed with the plan and demonstrated an understanding of the instructions.   The patient was advised to call back or seek an in-person evaluation if the symptoms worsen or if the condition fails to improve as anticipated.  I provided 30 minutes of non-face-to-face time during this encounter.   Anson Fret, MD   CC:  Migraines, stroke, now new seizure activity  05/03/2022: Migraines doing well. She had one bad one migraine and took [place] but otherwie doing well with migraines. And Admission to EMU unit pending 8/8. She also had another seizure-like event. 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 Reviewed Dr. Logan Bores' notes (epilepsy, EMU at [facility]) who wants her to stay on current dose Keppra. Discussed multiple options, Will change it to 500mg  ER bid instead of once daily. Patient agreed. No driving for 6 months until episode free.   Patient complains of symptoms per HPI as well as the following symptoms: seizure-like episode . Pertinent negatives and positives per HPI. All others negative   01/15/2022 addendum: reports another seizure, question non-epileptic events. Recommend admission to EMU monitoring unit and  consult with Dr. 01/17/2022 or his team. Patient agrees, referral sent.   Interval history 11/20/2021:  She hs had 4 "seizures" will start keppra and order 72 hour ambulatory eeg. She did not report any of them to me. She has video but it is difficult to see what is going on in the first video can only see part of her head moving slighly back and forth. The second one she is sitting, eyes open, unresponsive for about 30 seconds and then she wants to go to sleep and she doesn't remember. We tried to get her a 24 eeg an wasn't covered by insurance. She has missed some days of work. She was sitting at work for one and her screen just stopped moving, noone answered her phone, she was sleeping in the chair, she kind of feels off right beforehand, no known triggers, no tongue biting, no urination.  MRI brain 11/16/2021: IMPRESSION: This MRI of the brain with and without contrast shows the following: 1.  No acute findings. 2.  Normal enhancement pattern. 3.  Brain parenchyma appears normal.  Specifically, sequelae of the small acute infarctions noted on the 09/02/2020 were not noted   Interval history 10/26/2021: Since I last saw patient 05/2020,  lots has transpired. Patient was on Aimovig and doing well in 12/2020; her baseline was 23 headache days and 15 migraines but on aimovig she reduced to 1-2 migraines. In 08/2020 she was hospitalized in the setting of septic shock with acute combined systolic diastolic heart failure and possible seizure activity, aspiration pneumonia and acute CVA, in the setting of a root canal 2 days prior, symptoms seem to improve with  Narcan, echo showed reduced ejection fraction and global hypokinesis but TEE showed no evidence of embolic source and normal left ventricular systolic function, Zio patch was ordered by cardiology which was normal, follow-up TEE and echo showed heart function had returned to normal.  Since triptans were now contraindicated she was started on Nurtec, she  was encouraged to follow-up with cardiology and continue ASA for stroke prevention.  She followed up with [name] her stroke nurse in July 2022, at that time she had been stable from a stroke standpoint without new or recurring stroke TIA symptoms, she did have follow-up with cardiology and repeat echo in November 2021 showing normal EF, she completed a cardiac monitor which was negative.  At the time of admission for stroke she was found unresponsive with tongue biting.  She was also found to have protein S deficiency and she has been to see hematology for Hypercoagulable workup was positive for mild elevation of anticardiolipin Igm at 19 and decreased protein S activity at 37%.05/26/2021: Repeat cardiolipin antibody panel revealed mild increase in anticardiolipin IgM at 28.  She last saw hematology oncology in November of this year and continue to report migraines, chronic headaches, dizziness, presyncopal events.  She reported being on Cymbalta, Aimovig, Ambien some of her medications.  She did not meet criteria for antiphospholipid antibody syndrome and no further intervention was required for elevated anticardiolipin IgM level, repeat protein S level activity was checked in August 2022 and was within normal limits no further intervention required, she was diagnosed with iron deficiency anemia likely causing her fatigue and IV infusions were requested.  She was also referred to GI, I encouraged her to call [name] because she has not heard back.   She was recently seen in the emergency room earlier this month after having an episode where she had possible seizure activity, 2 or 3 of these, friend described unresponsiveness and then confused afterwards, no reported tonic-clonic activity, no loss of bowel or bladder, no tongue biting, no prior history of seizures except the unresponsiveness prior to her admission for stroke and acute heart failure, CT of the head showed no acute abnormality. Here  with her friend, her whole body tenses up, shaking slightly, incoherent, lasted 30-45 seconds, then opened eyes and no postictal confusion, when awake she zones out and doesn't remember the conversation, it is happening every few days, discussed no driving. She had a clean bill of health, had a monitor, cardiac wise everything was normal again. She is here with her friend who has witnessed her events. She had iron infusion treatments. She just started back on the aimovig which is extremely helpful for migraines.   EEG 09/03/2020: IMPRESSION: personally reviewed  This study is within normal limits. No seizures or epileptiform discharges were seen throughout the recording.  Personally reviewed imaging and agree with the following: CT head 10/10/2021: IMPRESSION: 1. No evidence of acute intracranial abnormality. 2. Partially visualized left maxillary sinus fluid and mucosal thickening. Correlate for acute sinusitis.  Triptans contraindicated due ot stroke. Try [name].she has failed multiple triptans including imitrexPO an dinjection, rizatriptan, zolmitriptanPO and nasal, eletriptan, alleve, tylenol, excedrin, naproxen, benadryl, robaxin,flexeril,methyprednisolone, zofran, tramadol, nurtec, seroquel, phenergan,compazine, oxy, topiramate, amitriptyline, reglan, naproxen, antivert, Propranolol, Nortriptyline, Fioricet, she has tried "everything", Zomig nasal, maxalt,aimovig,ajovy,emgality  Patient complains of symptoms per HPI as well as the following symptoms: fatigue . Pertinent negatives and positives per HPI. All others negative  Spent additional 0 minutes reviewing videos of her episodes and discussing care with  Dr. April Manson  Interval history June 14, 2020: Patient was approved for Ajovy last appointment, from a review of records she was seen in the emergency room April 10, 2020 with right shoulder pain for 2 weeks with onset of migraine the day before, migraine began the day before and unrelieved by  previous migraine medications, right-sided, pulsatile with light sensitivity, she denied any recent fever, dizziness, chest pain shortness of breath, abdominal pain or nausea vomiting, she has multiple medications that can be used for her migraines on her med list including Zofran, Nurtec, Imitrex, Ajovy,  She was given a Toradol injection.  She was given Robaxin as needed, exam consistent with muscle strain, also recommended she use heating pad, migraine likely triggered by this issue and Toradol IM helped and she was discharged with Robaxin.  Ajovy didn't help. Aimovig worked great but was wearing off by the end of the month We will prescriber her 70mg  twice daily. She did great on the Aimovig. Will try and get Nurtec approved she has failed multiple triptans including imitrex, rizatriptan, zolmitriptan, eletriptan. We discussed teratogenicity of medications, will check urine pregnancy test, asked her to always use condoms and backup. Discussed at length. Also discussed other options, botox, oral medications, acute and preventative measures.  Interval history 12/16/2019:  Patient here for follow up, she ran out of [place] and she couldn't get it. When she was on the aimovig she was doing great initially but Aimovig started wearing off and she would also have recurrence of migraines at the end of the month, but she hadn't taken it in several months, she is not planning on children. The migaines are back, we will restart but change to Ajovy due to wearing off of Aimovig.  As far as acute medications she tried imitrex, zomig nasal and maxalt.  We did have a long talk today, we will try to get Ajovy approved, she has tried multiple triptans we discussed other options and we decided on Nurtec.  I did give her some samples of Nurtec as well as Ajovy to get her through until we can get it approved.  Interval history; Patient doing exceptionally well on Aimovig. She has had only a few migraines since starting and  one was when she was last for the Aimovig. Her baseline is 23 headache days a month.  15 migraine days a month. MRi brain normal.  She has drainage and chronic sinusitis. Reviewed images with patient, she has drainage and sinus symptoms, MRI showed: "mucoperiosteal thickening and some mucous retention cysts within the left maxillary sinus and large mucous retention cyst within the right maxillary sinus."  HPI:  Tabitha Smith is a 31 y.o. female here as requested by Dr. Coralyn Mark for migraines. She has had them since a child. She missed lots of days of school in HS. Migraines/headaches worsening since having son 2 years ago. Migraines can be unilateral , eye pain, +photo/phonophobia, +nausea and vomiting, has to go into a dark room which helps, can't have any noise. Marland Kitchen Anxiety and stress makes it worse. 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 She also has intermittent dizziness with and without the headache. She feels like she just got off an elevator. When she lays down she gets a rush of dizziness and headache, positionally worse. She wakes in the morning with headaches, they have woken her up in the middle of the night. She is having numbness on the left side of her face. Not having anymore  children. Not sexually active and uses birth control. No aura. No medication overuse.    Medications tried: Sumatriptan, Topiramate, Propranolol, Nortriptyline, Fioricet, she has tried "everything", Zomig nasal, maxalt,Triptans contraindicated due ot stroke. Try [name].she has failed multiple triptans including imitrexPO an dinjection, rizatriptan, zolmitriptanPO and nasal, eletriptan, alleve, tylenol, excedrin, naproxen, benadryl, robaxin,flexeril,methyprednisolone, zofran, tramadol, nurtec, seroquel, phenergan,compazine, oxy, topiramate, amitriptyline, reglan, naproxen, antivert, Propranolol, Nortriptyline, Fioricet, she has tried "everything", Zomig nasal, maxalt,aimovig,ajovy,emgality  Reviewed  notes, labs and imaging from outside physicians, which showed:  Reviewed notes from referring physician Dr. Blenda Nicely.  Patient was seen in ear nose and throat for the evaluation of dizziness.  The problem intermittent.  Worse with moving around and last a few seconds.  Started 3 weeks prior to visit which was July 22, 2018.  Feels like she just got off an elevator, no hearing loss, no vertigo or room spinning, no voice changes she does report tingling in the hands and headaches with pain in the sides of the heads, with headaches there is photophobia and phonophobia, she is has history of migraines in the past.  Antinausea medications did not help.  Reviewed exam and work-up which was unremarkable.  Patient was scheduled for Dix-Hallpike with lenses and audiometric testing, and referred to neurology suspecting likely migraine disorder.   Review of Systems: Patient complains of symptoms per HPI as well as the following symptoms:shoulder pain, migraines . Pertinent negatives and positives per HPI. All others negative    Social History   Socioeconomic History   Marital status: Single    Spouse name: Not on file   Number of children: 1   Years of education: Not on file   Highest education level: High school graduate  Occupational History   Not on file  Tobacco Use   Smoking status: Every Day    Packs/day: 0.25    Types: Cigarettes   Smokeless tobacco: Never  Vaping Use   Vaping Use: Never used  Substance and Sexual Activity   Alcohol use: Yes    Comment: occasionally   Drug use: Never   Sexual activity: Yes    Birth control/protection: I.U.D.  Other Topics Concern   Not on file  Social History Narrative   Lives at home with family   Left handed   Caffeine: coffee, 2-4 cups/day   Social Determinants of Health   Financial Resource Strain: Not on file  Food Insecurity: Not on file  Transportation Needs: Not on file  Physical Activity: Not on file  Stress: Not on file   Social Connections: Not on file  Intimate Partner Violence: Not on file    Family History  Problem Relation Age of Onset   Diabetes Mother    Seizures Mother    Cancer Mother        Colon   Hypertension Mother    Stroke Mother    Migraines Neg Hx    Headache Neg Hx    Breast cancer Neg Hx    Ovarian cancer Neg Hx    Endometrial cancer Neg Hx    Pancreatic cancer Neg Hx    Prostate cancer Neg Hx     Past Medical History:  Diagnosis Date   Adnexal mass    Anxiety    CHF (congestive heart failure) (HCC)    Depression    History of acute heart failure    Hx of septic shock    IBS (irritable bowel syndrome)    Migraine without aura and without status migrainosus, not  intractable 10/26/2021   Pneumonia    PONV (postoperative nausea and vomiting)    Seizures (HCC)    Sinusitis    Stroke [facility])     Past Surgical History:  Procedure Laterality Date   BUBBLE STUDY  09/06/2020   Procedure: BUBBLE STUDY;  Surgeon: Pricilla Riffle, MD;  Location: [facility] ENDOSCOPY;  Service: Cardiovascular;;   DILATION AND CURETTAGE OF UTERUS N/A 06/14/2021   Procedure: DILATATION AND CURETTAGE;  Surgeon: Carver Fila, MD;  Location: [facility] ORS;  Service: Gynecology;  Laterality: N/A;   INTRAUTERINE DEVICE (IUD) INSERTION N/A 06/14/2021   Procedure: MERINA INTRAUTERINE DEVICE (IUD) INSERTION;  Surgeon: Carver Fila, MD;  Location: [facility] ORS;  Service: Gynecology;  Laterality: N/A;   NO PAST SURGERIES     stitches to abdomen      for a cut    TEE WITHOUT CARDIOVERSION N/A 09/06/2020   Procedure: TRANSESOPHAGEAL ECHOCARDIOGRAM (TEE);  Surgeon: Pricilla Riffle, MD;  Location: [facility] ENDOSCOPY;  Service: Cardiovascular;  Laterality: N/A;    Current Outpatient Medications  Medication Sig Dispense Refill   Erenumab-aooe (AIMOVIG) 70 MG/ML SOAJ Inject 70 mg into the skin every 14 (fourteen) days. 2 mL 6   levETIRAcetam (KEPPRA XR) 500 MG 24 hr tablet TAKE 2 TABLETS(1000 MG) BY MOUTH AT BEDTIME 60 tablet 3    linaclotide (LINZESS) 290 MCG CAPS capsule Take 290 mcg by mouth daily as needed (constipation).     pantoprazole (PROTONIX) 40 MG tablet Take 1 tablet (40 mg total) by mouth 2 (two) times daily before a meal. 60 tablet 2   Ubrogepant (UBRELVY) 100 MG TABS Take 100 mg by mouth every 2 (two) hours as needed. Maximum 200mg  a day. 16 tablet 11   zolpidem (AMBIEN) 10 MG tablet Take 10 mg by mouth at bedtime as needed for sleep.     No current facility-administered medications for this visit.    Allergies as of 04/27/2022 - Review Complete 02/15/2022  Allergen Reaction Noted   Amoxicillin Anaphylaxis 09/10/2017   Bee venom  12/22/2021   Penicillins Anaphylaxis 06/19/2016    Vitals: There were no vitals taken for this visit. Last Weight:  Wt Readings from Last 1 Encounters:  03/29/22 188 lb 8 oz (85.5 kg)   Last Height:   Ht Readings from Last 1 Encounters:  03/29/22 5\' 5"  (1.651 m)   Physical exam: Exam: Gen: NAD, conversant      CV: Could not perform over Web Video. Denies palpitations or chest pain or SOB. VS: Breathing at a normal rate. Weight appears within normal limits. Not febrile. Eyes: Conjunctivae clear without exudates or hemorrhage  Neuro: Detailed Neurologic Exam  Speech:    Speech is normal; fluent and spontaneous with normal comprehension.  Cognition:    The patient is oriented to person, place, and time;     recent and remote memory intact;     language fluent;     normal attention, concentration,     fund of knowledge Cranial Nerves:    The pupils are equal, round, and reactive to light. Cannot perform fundoscopic exam. Visual fields are full to finger confrontation. Extraocular movements are intact.  The face is symmetric with normal sensation. The palate elevates in the midline. Hearing intact. Voice is normal. Shoulder shrug is normal. The tongue has normal motion without fasciculations.   Coordination:    Normal finger to nose  Gait:    Normal  native gait  Motor Observation:   no involuntary movements noted.  Tone:    Appears normal  Posture:    Posture is normal. normal erect    Strength:    Strength is anti-gravity and symmetric in the upper and lower limbs.      Sensation: intact to LT       Assessment/Plan:  31 year old with chronic intractable migraines and now alterations of awareness/ witnessed seizure-like activity. In 08/2020 she was hospitalized in the setting of septic shock with acute combined systolic diastolic heart failure and possible seizure activity, aspiration pneumonia and acute CVA, in the setting of a root canal 2 days prior, symptoms seem to improve with Narcan, echo showed reduced ejection fraction and global hypokinesis but TEE showed no evidence of embolic source, follow-up TEE and echo showed heart function had returned to normal.  She followed up with [name] her stroke nurse in July 2022, at that time she had been stable from a stroke standpoint without new or recurring stroke TIA symptoms, she did have follow-up with cardiology and repeat echo in November 2021 showing normal EF, she completed a cardiac monitor which was negative.  She saw hematology   She did not meet criteria for antiphospholipid antibody syndrome and no further intervention was required for elevated anticardiolipin IgM level, repeat protein S level activity was checked in August 2022 and was within normal limits no further intervention required, she was diagnosed with iron deficiency anemia likely causing her fatigue and IV infusions were requested.  She was also referred to GI, pending.  - reports another seizure, has had multiple events, question non-epileptic events. Recommended admission to EMU monitoring unit and consult with Dr. Ernest Haber or his team. Admission to EMU unit pending 8/8.  Reviewed Dr. Amalia Hailey' notes (epilepsy, EMU at [facility]) who wants her to stay on current dose Keppra. Discussed multiple options, Will change it  to 500mg  ER bid instead of once daily. Patient agreed. No driving for 6 months until episode free.   - She was seen in the emergency room multiple times after having episodes where she had possible seizure activity, 2 or 3 of these, every few days friend described unresponsiveness, no reported tonic-clonic activity, no loss of bowel or bladder, no tongue biting, no prior history of seizures except the unresponsiveness prior to her admission for stroke and acute heart failure, CT of the head showed no acute abnormality. EEG 09/03/2020 in hospital negative. Prolonged ambuatory EEG negative. MRI brain completed no new events. She has had multiple known episodes. She has video her eyes are open, she is unresponsive, 30 seconds, then sleeps for several hours. Mother has seizures.  We started Keppra, will continue until EMU admission.    Migraines: Doing great on Aimovig and [name] is amazing.  Doing great on Aimovig now has 4 migraines a month only and 4 headache days a month (Total of only 8 monthly headaches). Triptans contraindicated due ot stroke. she has failed multiple triptans including imitrexPO an dinjection, rizatriptan, zolmitriptanPO and nasal, eletriptan, alleve, tylenol, excedrin, naproxen, benadryl, robaxin,flexeril,methyprednisolone, zofran, tramadol, nurtec, seroquel, phenergan,compazine, oxy, topiramate, amitriptyline, reglan, naproxen, antivert, Propranolol, Nortriptyline, Fioricet, she has tried "everything", Zomig nasal, maxalt,  - Ajovy did not work.   - discussed teratogenicity do not get pregnant.  - continue ASA 81mg  daily  Discussed AGAIN: Per [facility] statutes, patients with seizures are not allowed to drive until they have been seizure-free for six months.    Use caution when using heavy equipment or power tools. Avoid working on ladders or  at heights. Take showers instead of baths. Ensure the water temperature is not too high on the home water heater. Do not go  swimming alone. Do not lock yourself in a room alone (i.e. bathroom). When caring for infants or small children, sit down when holding, feeding, or changing them to minimize risk of injury to the child in the event you have a seizure. Maintain good sleep hygiene. Avoid alcohol.    If patient has another seizure, call 911 and bring them back to the ED if: A.  The seizure lasts longer than 5 minutes.      B.  The patient doesn't wake shortly after the seizure or has new problems such as difficulty seeing, speaking or moving following the seizure C.  The patient was injured during the seizure D.  The patient has a temperature over 102 F (39C) E.  The patient vomited during the seizure and now is having trouble breathing  Per [facility] statutes, patients with seizures are not allowed to drive until they have been seizure-free for six months.  Other recommendations include using caution when using heavy equipment or power tools. Avoid working on ladders or at heights. Take showers instead of baths.  Do not swim alone.  Ensure the water temperature is not too high on the home water heater. Do not go swimming alone. Do not lock yourself in a room alone (i.e. bathroom). When caring for infants or small children, sit down when holding, feeding, or changing them to minimize risk of injury to the child in the event you have a seizure. Maintain good sleep hygiene. Avoid alcohol.  Also recommend adequate sleep, hydration, good diet and minimize stress.  During the Seizure  - First, ensure adequate ventilation and place patients on the floor on their left side  Loosen clothing around the neck and ensure the airway is patent. If the patient is clenching the teeth, do not force the mouth open with any object as this can cause severe damage - Remove all items from the surrounding that can be hazardous. The patient may be oblivious to what's happening and may not even know what he or she is doing. If the  patient is confused and wandering, either gently guide him/her away and block access to outside areas - Reassure the individual and be comforting - Call 911. In most cases, the seizure ends before EMS arrives. However, there are cases when seizures may last over 3 to 5 minutes. Or the individual may have developed breathing difficulties or severe injuries. If a pregnant patient or a person with diabetes develops a seizure, it is prudent to call an ambulance. - Finally, if the patient does not regain full consciousness, then call EMS. Most patients will remain confused for about 45 to 90 minutes after a seizure, so you must use judgment in calling for help. - Avoid restraints but make sure the patient is in a bed with padded side rails - Place the individual in a lateral position with the neck slightly flexed; this will help the saliva drain from the mouth and prevent the tongue from falling backward - Remove all nearby furniture and other hazards from the area - Provide verbal assurance as the individual is regaining consciousness - Provide the patient with privacy if possible - Call for help and start treatment as ordered by the caregiver   fter the Seizure (Postictal Stage)  After a seizure, most patients experience confusion, fatigue, muscle pain and/or a headache. Thus, one  should permit the individual to sleep. For the next few days, reassurance is essential. Being calm and helping reorient the person is also of importance.  Most seizures are painless and end spontaneously. Seizures are not harmful to others but can lead to complications such as stress on the lungs, brain and the heart. Individuals with prior lung problems may develop labored breathing and respiratory distress.    Sarina Ill, MD  [facility] [address] [place], [place] [address]  Phone [phone] Fax [phone]

## 2022-05-10 ENCOUNTER — Encounter (HOSPITAL_BASED_OUTPATIENT_CLINIC_OR_DEPARTMENT_OTHER): Payer: Self-pay | Admitting: Obstetrics & Gynecology

## 2022-05-16 ENCOUNTER — Encounter (HOSPITAL_BASED_OUTPATIENT_CLINIC_OR_DEPARTMENT_OTHER): Payer: Self-pay | Admitting: Obstetrics & Gynecology

## 2022-05-16 ENCOUNTER — Other Ambulatory Visit: Payer: Self-pay

## 2022-05-16 NOTE — Progress Notes (Signed)
Spoke w/ via phone for pre-op interview---pt Lab needs dos----  urine preg             Lab results------lab appt 05-22-2022 for cbc bmp t & s, ekg COVID test -----patient states asymptomatic no test needed Arrive at -------845 am 05-22-2022 NPO after MN NO Solid Food.  Clear liquids from MN until---745 am Med rec completed Medications to take morning of surgery -----pantoprazole Diabetic medication -----n/a Patient instructed no nail polish to be worn day of surgery Patient instructed to bring photo id and insurance card day of surgery Patient aware to have Driver (ride ) / caregiver  [place] sta[place] partner   for 24 hours after surgery  Patient Special Instructions -----none Pre-Op special Istructions -----no smiking 24 hours before surgery Patient verbalized understanding of instructions that were given at this phone interview. Patient denies shortness of breath, chest pain, fever, cough at this phone interview.   Echo 10-08-2020 epic Lov neurology 04-27-2022 dr Lucia Gaskins epic [facility] oncology 03-29-2022  dr Leonides Schanz epic  Reviewed patient history and lov dr Lucia Gaskins neurology  04-27-2022 (last seizure 04-19-2022) with dr Janie Morning, pt ok for 04-22-2022 surgery at [facility] per dr Clemens Catholic, mda.

## 2022-05-18 ENCOUNTER — Encounter (HOSPITAL_COMMUNITY)
Admission: RE | Admit: 2022-05-18 | Discharge: 2022-05-18 | Disposition: A | Discharge status: Home / Self Care | Payer: Medicaid Other | Source: Ambulatory Visit | Attending: Obstetrics & Gynecology | Admitting: Obstetrics & Gynecology

## 2022-05-18 DIAGNOSIS — Z01818 Encounter for other preprocedural examination: Secondary | ICD-10-CM | POA: Insufficient documentation

## 2022-05-18 DIAGNOSIS — N921 Excessive and frequent menstruation with irregular cycle: Secondary | ICD-10-CM | POA: Diagnosis not present

## 2022-05-18 LAB — BASIC METABOLIC PANEL
Anion gap: 11 (ref 5–15)
BUN: 7 mg/dL (ref 6–20)
CO2: 23 mmol/L (ref 22–32)
Calcium: 9 mg/dL (ref 8.9–10.3)
Chloride: 103 mmol/L (ref 98–111)
Creatinine, Ser: 0.81 mg/dL (ref 0.44–1.00)
GFR, Estimated: >60 mL/min (ref >60)
Glucose, Bld: 103 mg/dL — ABNORMAL HIGH (ref 70–99)
Potassium: 3.3 mmol/L — ABNORMAL LOW (ref 3.5–5.1)
Sodium: 137 mmol/L (ref 135–145)

## 2022-05-18 LAB — CBC
HCT: 47.6 % — ABNORMAL HIGH (ref 36.0–46.0)
Hemoglobin: 16.2 g/dL — ABNORMAL HIGH (ref 12.0–15.0)
MCH: 32.1 pg (ref 26.0–34.0)
MCHC: 34 g/dL (ref 30.0–36.0)
MCV: 94.3 fL (ref 80.0–100.0)
Platelets: 279 10*3/uL (ref 150–400)
RBC: 5.05 MIL/uL (ref 3.87–5.11)
RDW: 13.8 % (ref 11.5–15.5)
WBC: 8 10*3/uL (ref 4.0–10.5)
nRBC: 0 % (ref 0.0–0.2)

## 2022-05-21 NOTE — Anesthesia Preprocedure Evaluation (Addendum)
Anesthesia Evaluation  Patient identified by MRN, date of birth, ID band Patient awake    Reviewed: Allergy & Precautions, NPO status , Patient's Chart, lab work & pertinent test results  History of Anesthesia Complications Negative for: history of anesthetic complications  Airway Mallampati: II  TM Distance: >3 FB Neck ROM: Full    Dental  (+) Teeth Intact, Dental Advisory Given   Pulmonary neg pulmonary ROS, Current Smoker and Patient abstained from smoking.,    Pulmonary exam normal breath sounds clear to auscultation       Cardiovascular (-) CHF Normal cardiovascular exam Rhythm:Regular Rate:Normal  08/2020 she was hospitalized in the setting of septic shock with acute combined systolic diastolic heart failure and possible seizure activity, aspiration pneumonia and acute CVA, in the setting of a root canal 2 days prior, symptoms seem to improve with Narcan, echo showed reduced ejection fraction and global hypokinesis but TEE showed no evidence of embolic source and normal left ventricular systolic function, Zio patch was ordered by cardiology which was normal, follow-up TEE and echo showed heart function had returned to normal.    Neuro/Psych  Headaches, Seizures - (last one about 1 mo ago, took keppra as schedule last night), Well Controlled,  PSYCHIATRIC DISORDERS Anxiety Depression CVA a/w hypercoagulable state- anticardiolipin  Follows w/ neurology for recurrent migraines, seizure-like activity  CVA (2021)    GI/Hepatic Neg liver ROS, GERD  Medicated and Controlled,  Endo/Other  negative endocrine ROS  Renal/GU negative Renal ROS  Female GU complaint     Musculoskeletal negative musculoskeletal ROS (+)   Abdominal   Peds  Hematology negative hematology ROS (+)   Anesthesia Other Findings   Reproductive/Obstetrics negative OB ROS                            Anesthesia Physical Anesthesia  Plan  ASA: 3  Anesthesia Plan: General   Post-op Pain Management: Tylenol PO (pre-op)* and Toradol IV (intra-op)*   Induction: Intravenous  PONV Risk Score and Plan: 4 or greater and Ondansetron, Dexamethasone, Midazolam, Treatment may vary due to age or medical condition and Scopolamine patch - Pre-op  Airway Management Planned: LMA  Additional Equipment: None  Intra-op Plan:   Post-operative Plan: Extubation in OR  Informed Consent: I have reviewed the patients History and Physical, chart, labs and discussed the procedure including the risks, benefits and alternatives for the proposed anesthesia with the patient or authorized representative who has indicated his/her understanding and acceptance.     Dental advisory given  Plan Discussed with: CRNA  Anesthesia Plan Comments:        Anesthesia Quick Evaluation

## 2022-05-22 ENCOUNTER — Other Ambulatory Visit: Payer: Self-pay

## 2022-05-22 ENCOUNTER — Ambulatory Visit (HOSPITAL_BASED_OUTPATIENT_CLINIC_OR_DEPARTMENT_OTHER)
Admission: RE | Admit: 2022-05-22 | Discharge: 2022-05-22 | Disposition: A | Discharge status: Home / Self Care | Payer: Medicaid Other | Attending: Obstetrics & Gynecology | Admitting: Obstetrics & Gynecology

## 2022-05-22 ENCOUNTER — Encounter (HOSPITAL_BASED_OUTPATIENT_CLINIC_OR_DEPARTMENT_OTHER): Payer: Self-pay | Admitting: Obstetrics & Gynecology

## 2022-05-22 ENCOUNTER — Encounter (HOSPITAL_BASED_OUTPATIENT_CLINIC_OR_DEPARTMENT_OTHER)
Admission: RE | Disposition: A | Discharge status: Home / Self Care | Payer: Self-pay | Source: Home / Self Care | Attending: Obstetrics & Gynecology

## 2022-05-22 ENCOUNTER — Ambulatory Visit (HOSPITAL_BASED_OUTPATIENT_CLINIC_OR_DEPARTMENT_OTHER): Payer: Medicaid Other | Admitting: Anesthesiology

## 2022-05-22 DIAGNOSIS — Z975 Presence of (intrauterine) contraceptive device: Secondary | ICD-10-CM | POA: Insufficient documentation

## 2022-05-22 DIAGNOSIS — F17210 Nicotine dependence, cigarettes, uncomplicated: Secondary | ICD-10-CM | POA: Insufficient documentation

## 2022-05-22 DIAGNOSIS — G43909 Migraine, unspecified, not intractable, without status migrainosus: Secondary | ICD-10-CM | POA: Insufficient documentation

## 2022-05-22 DIAGNOSIS — N920 Excessive and frequent menstruation with regular cycle: Secondary | ICD-10-CM

## 2022-05-22 DIAGNOSIS — K219 Gastro-esophageal reflux disease without esophagitis: Secondary | ICD-10-CM | POA: Diagnosis not present

## 2022-05-22 DIAGNOSIS — Z01818 Encounter for other preprocedural examination: Secondary | ICD-10-CM

## 2022-05-22 DIAGNOSIS — I5042 Chronic combined systolic (congestive) and diastolic (congestive) heart failure: Secondary | ICD-10-CM | POA: Diagnosis not present

## 2022-05-22 DIAGNOSIS — R569 Unspecified convulsions: Secondary | ICD-10-CM | POA: Diagnosis not present

## 2022-05-22 DIAGNOSIS — F418 Other specified anxiety disorders: Secondary | ICD-10-CM

## 2022-05-22 DIAGNOSIS — Z8673 Personal history of transient ischemic attack (TIA), and cerebral infarction without residual deficits: Secondary | ICD-10-CM | POA: Diagnosis not present

## 2022-05-22 DIAGNOSIS — I69398 Other sequelae of cerebral infarction: Secondary | ICD-10-CM

## 2022-05-22 HISTORY — PX: DILITATION & CURRETTAGE/HYSTROSCOPY WITH NOVASURE ABLATION: SHX5568

## 2022-05-22 HISTORY — DX: Iron deficiency anemia, unspecified: D50.9

## 2022-05-22 HISTORY — PX: IUD REMOVAL: SHX5392

## 2022-05-22 LAB — TYPE AND SCREEN
ABO/RH(D): B POS
Antibody Screen: NEGATIVE

## 2022-05-22 LAB — POCT PREGNANCY, URINE: Preg Test, Ur: NEGATIVE

## 2022-05-22 LAB — ABO/RH: ABO/RH(D): B POS

## 2022-05-22 SURGERY — DILATATION & CURETTAGE/HYSTEROSCOPY WITH NOVASURE ABLATION
Anesthesia: General | Site: Vagina

## 2022-05-22 MED ORDER — DEXAMETHASONE SODIUM PHOSPHATE 10 MG/ML IJ SOLN
INTRAMUSCULAR | Status: AC
  Filled 2022-05-22: qty 1

## 2022-05-22 MED ORDER — HYDROMORPHONE HCL 1 MG/ML IJ SOLN: 0.2500 mg | INTRAMUSCULAR | Status: DC | PRN

## 2022-05-22 MED ORDER — DEXMEDETOMIDINE (PRECEDEX) IN NS 20 MCG/5ML (4 MCG/ML) IV SYRINGE
PREFILLED_SYRINGE | INTRAVENOUS | Status: DC | PRN
  Administered 2022-05-22: 8 ug via INTRAVENOUS
  Administered 2022-05-22: 12 ug via INTRAVENOUS

## 2022-05-22 MED ORDER — OXYCODONE HCL 5 MG PO TABS: 5.0000 mg | ORAL_TABLET | Freq: Once | ORAL | Status: DC | PRN

## 2022-05-22 MED ORDER — OXYCODONE HCL 5 MG/5ML PO SOLN: 5.0000 mg | Freq: Once | ORAL | Status: DC | PRN

## 2022-05-22 MED ORDER — MIDAZOLAM HCL 2 MG/2ML IJ SOLN
INTRAMUSCULAR | Status: AC
  Filled 2022-05-22: qty 2

## 2022-05-22 MED ORDER — DEXAMETHASONE SODIUM PHOSPHATE 10 MG/ML IJ SOLN
INTRAMUSCULAR | Status: DC | PRN
  Administered 2022-05-22: 5 mg via INTRAVENOUS

## 2022-05-22 MED ORDER — OXYCODONE HCL 5 MG PO TABS: 5.0000 mg | ORAL_TABLET | ORAL | 0 refills | Status: DC | PRN

## 2022-05-22 MED ORDER — ACETAMINOPHEN 500 MG PO TABS
ORAL_TABLET | ORAL | Status: AC
  Filled 2022-05-22: qty 2

## 2022-05-22 MED ORDER — LIDOCAINE 2% (20 MG/ML) 5 ML SYRINGE
INTRAMUSCULAR | Status: DC | PRN
  Administered 2022-05-22: 60 mg via INTRAVENOUS

## 2022-05-22 MED ORDER — ONDANSETRON HCL 4 MG/2ML IJ SOLN
INTRAMUSCULAR | Status: AC
  Filled 2022-05-22: qty 2

## 2022-05-22 MED ORDER — ONDANSETRON HCL 4 MG/2ML IJ SOLN
4.0000 mg | Freq: Once | INTRAMUSCULAR | Status: DC | PRN
Start: 2022-05-22 — End: 2022-05-22

## 2022-05-22 MED ORDER — BUPIVACAINE-EPINEPHRINE 0.5% -1:200000 IJ SOLN
INTRAMUSCULAR | Status: DC | PRN
  Administered 2022-05-22: 20 mL

## 2022-05-22 MED ORDER — KETOROLAC TROMETHAMINE 30 MG/ML IJ SOLN: 30.0000 mg | Freq: Once | INTRAMUSCULAR | Status: DC | PRN

## 2022-05-22 MED ORDER — HYDROMORPHONE HCL 1 MG/ML IJ SOLN
INTRAMUSCULAR | Status: AC
  Filled 2022-05-22: qty 1

## 2022-05-22 MED ORDER — SCOPOLAMINE 1 MG/3DAYS TD PT72
MEDICATED_PATCH | TRANSDERMAL | Status: AC
  Filled 2022-05-22: qty 1

## 2022-05-22 MED ORDER — SODIUM CHLORIDE 0.9 % IR SOLN
Status: DC | PRN
  Administered 2022-05-22: 3000 mL

## 2022-05-22 MED ORDER — AMISULPRIDE (ANTIEMETIC) 5 MG/2ML IV SOLN
10.0000 mg | Freq: Once | INTRAVENOUS | Status: DC | PRN
Start: 2022-05-22 — End: 2022-05-22

## 2022-05-22 MED ORDER — KETOROLAC TROMETHAMINE 30 MG/ML IJ SOLN: 30.0000 mg | Freq: Once | INTRAMUSCULAR | 0 refills | Status: AC

## 2022-05-22 MED ORDER — ACETAMINOPHEN 500 MG PO TABS
1000.0000 mg | ORAL_TABLET | Freq: Once | ORAL | Status: AC
Start: 2022-05-22 — End: 2022-05-22
  Administered 2022-05-22: 1000 mg via ORAL

## 2022-05-22 MED ORDER — LACTATED RINGERS IV SOLN: INTRAVENOUS | Status: DC

## 2022-05-22 MED ORDER — KETOROLAC TROMETHAMINE 30 MG/ML IJ SOLN
INTRAMUSCULAR | Status: AC
  Filled 2022-05-22: qty 1

## 2022-05-22 MED ORDER — OXYCODONE HCL 5 MG PO TABS: 5.0000 mg | ORAL_TABLET | ORAL | Status: DC | PRN

## 2022-05-22 MED ORDER — KETOROLAC TROMETHAMINE 30 MG/ML IJ SOLN
INTRAMUSCULAR | Status: DC | PRN
  Administered 2022-05-22: 30 mg via INTRAVENOUS

## 2022-05-22 MED ORDER — ONDANSETRON HCL 4 MG/2ML IJ SOLN
INTRAMUSCULAR | Status: DC | PRN
  Administered 2022-05-22: 4 mg via INTRAVENOUS

## 2022-05-22 MED ORDER — LIDOCAINE HCL (PF) 2 % IJ SOLN
INTRAMUSCULAR | Status: AC
  Filled 2022-05-22: qty 5

## 2022-05-22 MED ORDER — IBUPROFEN 800 MG PO TABS: 800.0000 mg | ORAL_TABLET | Freq: Three times a day (TID) | ORAL | Status: DC | PRN

## 2022-05-22 MED ORDER — MEPERIDINE HCL 25 MG/ML IJ SOLN
6.2500 mg | INTRAMUSCULAR | Status: DC | PRN
  Administered 2022-05-22: 6.25 mg via INTRAVENOUS

## 2022-05-22 MED ORDER — POVIDONE-IODINE 10 % EX SWAB: 2.0000 | Freq: Once | CUTANEOUS | Status: DC

## 2022-05-22 MED ORDER — MIDAZOLAM HCL 2 MG/2ML IJ SOLN
INTRAMUSCULAR | Status: DC | PRN
  Administered 2022-05-22: 2 mg via INTRAVENOUS

## 2022-05-22 MED ORDER — IBUPROFEN 800 MG PO TABS: 800.0000 mg | ORAL_TABLET | Freq: Three times a day (TID) | ORAL | 0 refills | Status: DC | PRN

## 2022-05-22 MED ORDER — PROPOFOL 10 MG/ML IV BOLUS
INTRAVENOUS | Status: DC | PRN
  Administered 2022-05-22: 200 mg via INTRAVENOUS
  Administered 2022-05-22: 20 mg via INTRAVENOUS
  Administered 2022-05-22: 30 mg via INTRAVENOUS

## 2022-05-22 MED ORDER — FENTANYL CITRATE (PF) 100 MCG/2ML IJ SOLN
INTRAMUSCULAR | Status: DC | PRN
Start: 2022-05-22 — End: 2022-05-22
  Administered 2022-05-22 (×2): 50 ug via INTRAVENOUS

## 2022-05-22 MED ORDER — SCOPOLAMINE 1 MG/3DAYS TD PT72
1.0000 | MEDICATED_PATCH | TRANSDERMAL | Status: DC
Start: 2022-05-22 — End: 2022-05-22
  Administered 2022-05-22: 1.5 mg via TRANSDERMAL

## 2022-05-22 MED ORDER — FENTANYL CITRATE (PF) 100 MCG/2ML IJ SOLN
INTRAMUSCULAR | Status: AC
  Filled 2022-05-22: qty 2

## 2022-05-22 MED ORDER — MEPERIDINE HCL 25 MG/ML IJ SOLN
INTRAMUSCULAR | Status: AC
  Filled 2022-05-22: qty 1

## 2022-05-22 SURGICAL SUPPLY — 17 items
ABLATOR SURESOUND NOVASURE (ABLATOR) ×1 IMPLANT
DRSG TELFA 3X8 NADH (GAUZE/BANDAGES/DRESSINGS) ×2 IMPLANT
GLOVE BIO SURGEON STRL SZ7 (GLOVE) ×1 IMPLANT
GLOVE BIOGEL PI IND STRL 7.0 (GLOVE) ×2 IMPLANT
GLOVE BIOGEL PI INDICATOR 7.0 (GLOVE) ×4
GLOVE SURG SS PI 6.5 STRL IVOR (GLOVE) ×2 IMPLANT
GOWN STRL REUS W/TWL LRG LVL3 (GOWN DISPOSABLE) ×5 IMPLANT
IV NS IRRIG 3000ML ARTHROMATIC (IV SOLUTION) ×1 IMPLANT
KIT PROCEDURE FLUENT (KITS) ×2 IMPLANT
KIT TURNOVER CYSTO (KITS) ×2 IMPLANT
PACK VAGINAL MINOR WOMEN LF (CUSTOM PROCEDURE TRAY) ×2 IMPLANT
PAD DRESSING TELFA 3X8 NADH (GAUZE/BANDAGES/DRESSINGS) ×1 IMPLANT
PAD OB MATERNITY 4.3X12.25 (PERSONAL CARE ITEMS) ×2 IMPLANT
SEAL ROD LENS SCOPE MYOSURE (ABLATOR) ×2 IMPLANT
SOL PREP POV-IOD 4OZ 10% (MISCELLANEOUS) ×1 IMPLANT
SPIKE FLUID TRANSFER (MISCELLANEOUS) ×1 IMPLANT
TOWEL OR 17X26 10 PK STRL BLUE (TOWEL DISPOSABLE) ×1 IMPLANT

## 2022-05-22 NOTE — OR Nursing (Signed)
1130H- IUD was removed by Dr. Sallye Ober and was discarded.

## 2022-05-22 NOTE — Transfer of Care (Signed)
Immediate Anesthesia Transfer of Care Note  Patient: Akhila Mahnken  Procedure(s) Performed: Procedure(s) (LRB): DILATATION & CURETTAGE/HYSTEROSCOPY WITH NOVASURE ABLATION (N/A) INTRAUTERINE DEVICE (IUD) REMOVAL (N/A)  Patient Location: PACU  Anesthesia Type: General  Level of Consciousness: awake, sedated and patient cooperative. VSS and respirations regular and even  Airway & Oxygen Therapy: Patient Spontanous Breathing and Patient connected to face mask oxygen  Post-op Assessment: Report given to PACU RN and Post -op Vital signs reviewed and stable  Post vital signs: Reviewed and stable  Complications: No apparent anesthesia complications Last Vitals:  Vitals Value Taken Time  BP 123/61 05/22/22 1217  Temp    Pulse 103 05/22/22 1221  Resp 29 05/22/22 1221  SpO2 100 % 05/22/22 1221  Vitals shown include unvalidated device data.  Last Pain:  Vitals:   05/22/22 0847  TempSrc: Oral         Complications: No notable events documented.

## 2022-05-22 NOTE — Interval H&P Note (Signed)
History and Physical Interval Note:  05/22/2022 10:33 AM  Tabitha Smith  has presented today for surgery, with the diagnosis of EXCESSIVE AND FREQUENT MENSTRUATION.  The various methods of treatment have been discussed with the patient and family. After consideration of risks, benefits and other options for treatment, the patient has consented to  Procedure(s): DILATATION & CURETTAGE/HYSTEROSCOPY WITH NOVASURE ABLATION (N/A) AND MIRENA IUD REMOVAL as a surgical intervention.  The patient's history has been reviewed, patient examined, no change in status, stable for surgery.  I have reviewed the patient's chart and labs.  Questions were answered to the patient's satisfaction.    Tabitha Creson W Vernadine Coombs,MD.

## 2022-05-22 NOTE — Discharge Instructions (Addendum)
[place], 1. Nothing in vagina x 2 weeks: no sex, no tampons, no douching x 2 weeks.  2. You may return to work or school in 2 days.  3. No taking baths for the next two weeks but you may shower as usual.   4. Expect some vaginal bleeding and vaginal discharge (may be brown or yellow or tan) for next several days to 6 weeks, call me if with excessive bleeding requiring you to change a pad every hour of if with greenish discharge.   5.  Expect some abdominal cramping, take tylenol as needed.  You may also use prescribed ibuprofen and oxycodone if pain is not well controlled.  Use oxycodone if the first two are not working.  Call me if pain is intolerable despite medication use. 6. Call me if with fever of 100.4 or greater, uncontrollable nausea or vomiting or any other concerns.   I wish you a quick and safe recovery,  Dr. Hoover Browns.  Phone [phone].       NO TYLENOL PRODUCTS UNTIL 3:00 PM TODAY.     Post Anesthesia Home Care Instructions  Activity: Get plenty of rest for the remainder of the day. A responsible individual must stay with you for 24 hours following the procedure.  For the next 24 hours, DO NOT: -Drive a car -Advertising copywriter -Drink alcoholic beverages -Take any medication unless instructed by your physician -Make any legal decisions or sign important papers.  Meals: Start with liquid foods such as gelatin or soup. Progress to regular foods as tolerated. Avoid greasy, spicy, heavy foods. If nausea and/or vomiting occur, drink only clear liquids until the nausea and/or vomiting subsides. Call your physician if vomiting continues.  Special Instructions/Symptoms: Your throat may feel dry or sore from the anesthesia or the breathing tube placed in your throat during surgery. If this causes discomfort, gargle with warm salt water. The discomfort should disappear within 24 hours.  If you had a scopolamine patch placed behind your ear for the  management of post- operative nausea and/or vomiting:  1. The medication in the patch is effective for 72 hours, after which it should be removed.  Wrap patch in a tissue and discard in the trash. Wash hands thoroughly with soap and water. 2. You may remove the patch earlier than 72 hours if you experience unpleasant side effects which may include dry mouth, dizziness or visual disturbances. 3. Avoid touching the patch. Wash your hands with soap and water after contact with the patch.

## 2022-05-22 NOTE — Op Note (Incomplete)
  Name: * MRN: DOB:  PREOP DIAGNOSIS:  1.Heavy and irregular periods.  2. Endometrial polyp  Post operative diagnosis:  Same as above  Procedures:  Dilation and Curettage (D & C) hysteroscopy and Novasure ablation   Surgeon: Dr. Waymon Amato  Assistant: None  Anesthesiologist: General anesthesia- LMA  Complications: None   IV fluid: 1500 cc LR  Fluid deficit: 150 cc  Urine: Voided     EBL: 10 cc  Indications: 31 year old Para 3 with a history of heavy and irregular periods, and endometrial polyp, here for D & C hysteroscopy and endometrial ablation.  Pelvic ultrasound showed a small intramural fibroid and an endometrial lesion suspect polyps.        Procedure:  Informed consent was obtained from the patient to undergo the procedure.  She was taken to the operating room and MAC anesthesia was administered without difficult but had to be converted to LMA as patient was having coughing bouts and airway could not be maintained.  She self voided during  the coughing bouts.  She was prepped and draped in the usual sterile fashion.  A graves speculum was used to view the cervix. Single-tooth tenaculum was placed anteriorly on the cervix. 20 cc of 0.5% lidocaine with epinephrine instilled as a paracervical block.  Uterus sounded and cervical canal noted to be 3 cm and uterus total sound to be 8 cm, cavity length was 5 cm.  The cervix was dilated to #15 [facility] dilator.  The diagnostic hysteroscope was then placed to view the uterine cavity with limited views, therefore the Omni hysterocope was used with clearer view and uterus was noted to have two small pink lesions just beyond the internal cervical os. The Aquilex system had to be replaced due to malfunction. Curettage as well as polyp forceps was used to remove these lesions.  Repeat hysteroscope showed the specimen removed.  Both tubal ostia were noted during the procedure.   The cervix was further dilated to the #18.   Novasure ablation was  then performed in the standard fashion for 1 minute 20 seconds, uterine width was 3.7 cm, cavity length 5 cm.  The Novasure device was then removed and repeat hysteroscopy performed with endometrial tissue noted to be blanched.  Hysteroscope and tenaculum were removed and remaining instruments were then removed.  The patient was awoken from anesthesia and was taken to the PACU in stable condition.   Specimen:  Endometrial curettings with endometrial lesion. Endocervical curettings.      Disposition: Stable to PACU.   Dr. Waymon Amato. 02/20/2022. 5:00 PM.

## 2022-05-22 NOTE — Anesthesia Procedure Notes (Signed)
Procedure Name: LMA Insertion Date/Time: 05/22/2022 11:17 AM  Performed by: Francie Massing, CRNAPre-anesthesia Checklist: Patient identified, Emergency Drugs available, Suction available and Patient being monitored Patient Re-evaluated:Patient Re-evaluated prior to induction Oxygen Delivery Method: Circle system utilized Preoxygenation: Pre-oxygenation with 100% oxygen Induction Type: IV induction Ventilation: Mask ventilation without difficulty LMA: LMA inserted LMA Size: 4.0 Number of attempts: 1 Airway Equipment and Method: Bite block Placement Confirmation: positive ETCO2 Tube secured with: Tape Dental Injury: Teeth and Oropharynx as per pre-operative assessment

## 2022-05-22 NOTE — H&P (Signed)
Tabitha Smith is an 31 y.o. female Para 1 with a history of excessive, frequent and irregular periods here for dilation and curettage (D & C) hysteroscopy, Novasure endometrial ablation and Mirena IUD removal.       Pertinent Gynecological History: Menses:  frequent, irregular, sometimes heavy Bleeding: Prolonged bleeding, abnormal uterine bleeding Contraception: IUD DES exposure: unknown Blood transfusions: none Sexually transmitted diseases: no past history Previous GYN Procedures: DNC  Last mammogram:  N/A   Last pap: normal Date: 05/18/21: Neg/neg OB History: G1, P1001   Menstrual History: Patient's last menstrual period was 04/24/2022 (approximate).    Past Medical History:  Diagnosis Date   Abnormal uterine bleeding 2023   Adnexal mass    Anxiety    Depression    w/ hx of suidical ideation   History of acute heart failure 08/2020   in the setting of septic shock and acute CVA after having a root canal   history of CHF (congestive heart failure) (HCC) 08/2020   acute systolic and diastolic heart failure in the setting of septic shock and acute CVA. LOV with cardiologist Dr. Epifanio Lesches on 01/11/21 in Epic (f/u as needed).   Hx of septic shock 08/2020   Hospital admission 09/02/20 to 09/07/20, pt has a root canal about 2 days before hospital admission   IBS (irritable bowel syndrome)    Iron deficiency anemia    hx of IV iron, Pt follows with Dr. Jeanie Sewer, Hematology / Oncology, lov 03/29/22 in [place].   Migraine without aura and without status migrainosus, not intractable 10/26/2021   Follows with Dr. Naomie Dean, LOV 04/27/22.   Numbness and tingling of right arm 02/15/2022   w/ confusion, see office visit w/ Shawnie Dapper, NP, neurology, dated 02/15/22 in [place].   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 Pt follows with neurologist, Dr. Naomie Dean at [facility], LOV (video visit) 04/27/22 in Epic. 03/01/22 EEG - normal awake and asleep EEG.   Sinusitis    Stroke [facility]) 09/02/2020   09/02/2020 MRI of brain (in Epic)  revealed multi-focal left occipitotemporal & left cerebellar infarcts. Patient was also found to be hypercoagulable with and elevated anticardiolipen.    Past Surgical History:  Procedure Laterality Date   BUBBLE STUDY  09/06/2020   Procedure: BUBBLE STUDY;  Surgeon: Pricilla Riffle, MD;  Location: [facility] ENDOSCOPY;  Service: Cardiovascular;;   COLONOSCOPY WITH ESOPHAGOGASTRODUODENOSCOPY (EGD)  01/10/2022   DILATION AND CURETTAGE OF UTERUS N/A 06/14/2021   Procedure: DILATATION AND CURETTAGE;  Surgeon: Carver Fila, MD;  Location: [facility] ORS;  Service: Gynecology;  Laterality: N/A;   INTRAUTERINE DEVICE (IUD) INSERTION N/A 06/14/2021   Procedure: MERINA INTRAUTERINE DEVICE (IUD) INSERTION;  Surgeon: Carver Fila, MD;  Location: [facility] ORS;  Service: Gynecology;  Laterality: N/A;   stitches to abdomen      for a cut    TEE WITHOUT CARDIOVERSION N/A 09/06/2020   Procedure: TRANSESOPHAGEAL ECHOCARDIOGRAM (TEE);  Surgeon: Pricilla Riffle, MD;  Location: [facility] ENDOSCOPY;  Service: Cardiovascular;  Laterality: N/A;    Family History  Problem Relation Age of Onset   Diabetes Mother    Seizures Mother    Cancer Mother        Colon   Hypertension Mother    Stroke Mother  Migraines Neg Hx    Headache Neg Hx    Breast cancer Neg Hx    Ovarian cancer Neg Hx    Endometrial cancer Neg Hx    Pancreatic cancer Neg Hx    Prostate cancer Neg Hx     Social History:  reports that she has been smoking cigarettes. She has a 3.00 pack-year smoking history. She has never used smokeless tobacco. She reports current alcohol use. She reports that she does not use drugs.  Allergies:  Allergies  Allergen Reactions   Amoxicillin  Anaphylaxis   Bee Venom    Penicillins Anaphylaxis    Has patient had a PCN reaction causing immediate rash, facial/tongue/throat swelling, SOB or lightheadedness with hypotension: Yes Has patient had a PCN reaction causing severe rash involving mucus membranes or skin necrosis: Unk Has patient had a PCN reaction that required hospitalization: No Has patient had a PCN reaction occurring within the last 10 years: No If all of the above answers are "NO", then may proceed with Cephalosporin use.     Medications Prior to Admission  Medication Sig Dispense Refill Last Dose   levETIRAcetam (KEPPRA XR) 500 MG 24 hr tablet TAKE 2 TABLETS(1000 MG) BY MOUTH AT BEDTIME 60 tablet 3 05/21/2022   levonorgestrel (MIRENA) 20 MCG/DAY IUD 1 each by Intrauterine route once. Inserted 2022      pantoprazole (PROTONIX) 40 MG tablet Take 1 tablet (40 mg total) by mouth 2 (two) times daily before a meal. 60 tablet 2 05/21/2022   Ubrogepant (UBRELVY) 100 MG TABS Take 100 mg by mouth every 2 (two) hours as needed. Maximum 200mg  a day. 16 tablet 11 Past Week   Erenumab-aooe (AIMOVIG) 70 MG/ML SOAJ Inject 70 mg into the skin every 14 (fourteen) days. 2 mL 6 More than a month   linaclotide (LINZESS) 290 MCG CAPS capsule Take 290 mcg by mouth daily as needed (constipation).   Unknown   zolpidem (AMBIEN) 10 MG tablet Take 10 mg by mouth at bedtime as needed for sleep.   Unknown    Review of Systems Constitutional: Denies fevers/chills Cardiovascular: Denies chest pain or palpitations Pulmonary: Denies coughing or wheezing Gastrointestinal: Denies nausea, vomiting or diarrhea Genitourinary: Denies pelvic pain, unusual vaginal discharge, dysuria, urgency or frequency.  With unusual vaginal bleeding.  Musculoskeletal: Denies muscle or joint aches and pain.  Neurology: Denies abnormal sensations such as tingling or numbness.    Blood pressure 129/87, pulse 69, temperature 98.1 F (36.7 C), temperature source Oral, resp.  rate 17, height 5\' 5"  (1.651 m), weight 84.2 kg, last menstrual period 04/24/2022, SpO2 97 %. Physical Exam  Constitutional: She is oriented to person, place, and time. She appears well-developed and well-nourished.  HENT:  Head: Normocephalic and atraumatic.  Neck: Normal range of motion.  Cardiovascular: Normal rate, regular rhythm and normal heart sounds.   Respiratory: Effort normal and breath sounds normal.  GI: Soft. Bowel sounds are normal.  Neurological: She is alert and oriented to person, place, and time.  Skin: Skin is warm and dry.  Psychiatric: She has a normal mood and affect. Her behavior is normal.   Results for orders placed or performed during the hospital encounter of 05/22/22 (from the past 24 hour(s))  Pregnancy, urine POC     Status: None   Collection Time: 05/22/22  8:39 AM  Result Value Ref Range   Preg Test, Ur NEGATIVE NEGATIVE   CBC    Component Value Date/Time   WBC 8.0 05/18/2022 1123  RBC 5.05 05/18/2022 1123   HGB 16.2 (H) 05/18/2022 1123   HGB 14.3 03/29/2022 1352   HGB 11.4 08/06/2018 1041   HCT 47.6 (H) 05/18/2022 1123   HCT 34.5 08/06/2018 1041   PLT 279 05/18/2022 1123   PLT 259 03/29/2022 1352   PLT 258 08/06/2018 1041   MCV 94.3 05/18/2022 1123   MCV 85 08/06/2018 1041   MCH 32.1 05/18/2022 1123   MCHC 34.0 05/18/2022 1123   RDW 13.8 05/18/2022 1123   RDW 13.3 08/06/2018 1041   LYMPHSABS 2.0 03/29/2022 1352   MONOABS 0.6 03/29/2022 1352   EOSABS 0.1 03/29/2022 1352   BASOSABS 0.0 03/29/2022 1352    CMP     Component Value Date/Time   NA 137 05/18/2022 1123   NA 139 10/26/2021 1001   K 3.3 (L) 05/18/2022 1123   CL 103 05/18/2022 1123   CO2 23 05/18/2022 1123   GLUCOSE 103 (H) 05/18/2022 1123   BUN 7 05/18/2022 1123   BUN 10 10/26/2021 1001   CREATININE 0.81 05/18/2022 1123   CREATININE 1.03 (H) 03/29/2022 1352   CALCIUM 9.0 05/18/2022 1123   PROT 7.5 03/29/2022 1352   PROT 6.6 08/06/2018 1041   ALBUMIN 4.1 03/29/2022 1352    ALBUMIN 4.3 08/06/2018 1041   AST 20 03/29/2022 1352   ALT 15 03/29/2022 1352   ALKPHOS 51 03/29/2022 1352   BILITOT 0.4 03/29/2022 1352   GFRNONAA >60 05/18/2022 1123   GFRNONAA >60 03/29/2022 1352   GFRAA >60 01/01/2020 1700    Assessment/Plan: 31 y/o with history of excessive, frequent and irregular periods here for dilation and curettage (D & C) hysteroscopy, Novasure endometrial ablation and Mirena IUD removal, - Admit to [facility].  - This procedure has been fully reviewed with the patient and written informed consent has been obtained.  - Discussed with patient risks of bleeding, infection, damage to organs and nerves and possible need for additional procedures. We discussed pregnancy is not advised after an endometrial ablation and she states she does not desire future pregnancies.   - NPO and IV fluids.  Prescilla Sours, MD.  05/22/2022, 10:17 AM

## 2022-05-22 NOTE — Anesthesia Postprocedure Evaluation (Signed)
Anesthesia Post Note  Patient: Tabitha Smith  Procedure(s) Performed: DILATATION & CURETTAGE/HYSTEROSCOPY WITH NOVASURE ABLATION (Vagina ) INTRAUTERINE DEVICE (IUD) REMOVAL (Vagina )     Patient location during evaluation: PACU Anesthesia Type: General Level of consciousness: awake and alert, oriented and patient cooperative Pain management: pain level controlled Vital Signs Assessment: post-procedure vital signs reviewed and stable Respiratory status: spontaneous breathing, nonlabored ventilation and respiratory function stable Cardiovascular status: blood pressure returned to baseline and stable Postop Assessment: no apparent nausea or vomiting Anesthetic complications: no Comments: Exhibited normal postoperative shaking in the OR and PACU, better w/ demerol. One possible "seizure-like" episode in PACU that lasted <12min which consisted of shaking and eyes rolled back, unresponsive to voice- but resolved on its own. No postictal state exhibited. 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    No notable events documented.  Last Vitals:  Vitals:   05/22/22 0847  BP: 129/87  Pulse: 69  Resp: 17  Temp: 36.7 C  SpO2: 97%    Last Pain:  Vitals:   05/22/22 0847  TempSrc: Oral                 Lannie Fields

## 2022-05-23 ENCOUNTER — Encounter (HOSPITAL_BASED_OUTPATIENT_CLINIC_OR_DEPARTMENT_OTHER): Payer: Self-pay | Admitting: Obstetrics & Gynecology

## 2022-05-23 LAB — SURGICAL PATHOLOGY

## 2022-06-15 ENCOUNTER — Other Ambulatory Visit: Payer: Self-pay | Admitting: Gastroenterology

## 2022-06-30 ENCOUNTER — Encounter (HOSPITAL_BASED_OUTPATIENT_CLINIC_OR_DEPARTMENT_OTHER): Payer: Self-pay

## 2022-06-30 ENCOUNTER — Other Ambulatory Visit: Payer: Self-pay

## 2022-06-30 DIAGNOSIS — R1032 Left lower quadrant pain: Secondary | ICD-10-CM | POA: Insufficient documentation

## 2022-06-30 DIAGNOSIS — I509 Heart failure, unspecified: Secondary | ICD-10-CM | POA: Insufficient documentation

## 2022-06-30 DIAGNOSIS — Z79899 Other long term (current) drug therapy: Secondary | ICD-10-CM | POA: Insufficient documentation

## 2022-06-30 LAB — URINALYSIS, ROUTINE W REFLEX MICROSCOPIC
Bilirubin Urine: NEGATIVE
Glucose, UA: NEGATIVE mg/dL
Leukocytes,Ua: NEGATIVE
Nitrite: NEGATIVE
Protein, ur: NEGATIVE mg/dL
Specific Gravity, Urine: 1.017 (ref 1.005–1.030)
pH: 6.5 (ref 5.0–8.0)

## 2022-06-30 LAB — CBC
HCT: 48.2 % — ABNORMAL HIGH (ref 36.0–46.0)
Hemoglobin: 16.4 g/dL — ABNORMAL HIGH (ref 12.0–15.0)
MCH: 31.5 pg (ref 26.0–34.0)
MCHC: 34 g/dL (ref 30.0–36.0)
MCV: 92.5 fL (ref 80.0–100.0)
Platelets: 277 10*3/uL (ref 150–400)
RBC: 5.21 MIL/uL — ABNORMAL HIGH (ref 3.87–5.11)
RDW: 13 % (ref 11.5–15.5)
WBC: 8.6 10*3/uL (ref 4.0–10.5)
nRBC: 0 % (ref 0.0–0.2)

## 2022-06-30 LAB — PREGNANCY, URINE: Preg Test, Ur: NEGATIVE

## 2022-06-30 NOTE — ED Triage Notes (Signed)
POV, pt sts that she began having intermittent sharp pains on left flank approx 2 weeks ago, sts urine is limited, unknown LBM, endorses N/V. Alert and oriented x 4, ambulatory to triage.

## 2022-07-01 ENCOUNTER — Emergency Department (HOSPITAL_BASED_OUTPATIENT_CLINIC_OR_DEPARTMENT_OTHER)
Admission: EM | Admit: 2022-07-01 | Discharge: 2022-07-01 | Disposition: A | Discharge status: Home / Self Care | Payer: Self-pay | Attending: Emergency Medicine | Admitting: Emergency Medicine

## 2022-07-01 ENCOUNTER — Emergency Department (HOSPITAL_BASED_OUTPATIENT_CLINIC_OR_DEPARTMENT_OTHER): Payer: Self-pay

## 2022-07-01 DIAGNOSIS — R109 Unspecified abdominal pain: Secondary | ICD-10-CM

## 2022-07-01 LAB — COMPREHENSIVE METABOLIC PANEL
ALT: 13 U/L (ref 0–44)
AST: 16 U/L (ref 15–41)
Albumin: 4.3 g/dL (ref 3.5–5.0)
Alkaline Phosphatase: 54 U/L (ref 38–126)
Anion gap: 8 (ref 5–15)
BUN: 6 mg/dL (ref 6–20)
CO2: 26 mmol/L (ref 22–32)
Calcium: 10 mg/dL (ref 8.9–10.3)
Chloride: 106 mmol/L (ref 98–111)
Creatinine, Ser: 0.88 mg/dL (ref 0.44–1.00)
GFR, Estimated: >60 mL/min (ref >60)
Glucose, Bld: 94 mg/dL (ref 70–99)
Potassium: 3.4 mmol/L — ABNORMAL LOW (ref 3.5–5.1)
Sodium: 140 mmol/L (ref 135–145)
Total Bilirubin: 0.4 mg/dL (ref 0.3–1.2)
Total Protein: 7.2 g/dL (ref 6.5–8.1)

## 2022-07-01 LAB — LIPASE, BLOOD: Lipase: 27 U/L (ref 11–51)

## 2022-07-01 MED ORDER — IOHEXOL 300 MG/ML  SOLN: 100.0000 mL | Freq: Once | INTRAMUSCULAR | Status: DC | PRN

## 2022-07-01 MED ORDER — KETOROLAC TROMETHAMINE 15 MG/ML IJ SOLN
15.0000 mg | Freq: Once | INTRAMUSCULAR | Status: AC
  Administered 2022-07-01: 15 mg via INTRAVENOUS
  Filled 2022-07-01: qty 1

## 2022-07-01 NOTE — ED Provider Notes (Signed)
MEDCENTER [facility] EMERGENCY DEPT Provider Note  CSN: [id number] Arrival date & time: 06/30/22 2321  Chief Complaint(s) Flank Pain  HPI Tabitha Smith is a 31 y.o. female with a past medical history listed below including heart failure and CVAs related to septic shock after root canal, abnormal uterine bleeding status post endometrial ablation 5 weeks ago here for 2 weeks of left-sided leg pain  The history is provided by the patient.  Flank Pain This is a new problem. Episode onset: 2 weeks. Episode frequency: intermittent until 2 days ago. constant since. The problem has not changed since onset.Associated symptoms include abdominal pain. Pertinent negatives include no chest pain, no headaches and no shortness of breath. Exacerbated by: movement,palpation. Nothing relieves the symptoms. She has tried nothing for the symptoms.   Patient endorses clearish white vaginal discharge.  Past Medical History Past Medical History:  Diagnosis Date   Abnormal uterine bleeding 2023   Adnexal mass    Anxiety    Depression    w/ hx of suidical ideation   History of acute heart failure 08/2020   in the setting of septic shock and acute CVA after having a root canal   history of CHF (congestive heart failure) (HCC) 08/2020   acute systolic and diastolic heart failure in the setting of septic shock and acute CVA. LOV with cardiologist Dr. Epifanio Lesches on 01/11/21 in Epic (f/u as needed).   Hx of septic shock 08/2020   Hospital admission 09/02/20 to 09/07/20, pt has a root canal about 2 days before hospital admission   IBS (irritable bowel syndrome)    Iron deficiency anemia    hx of IV iron, Pt follows with Dr. Jeanie Sewer, Hematology / Oncology, lov 03/29/22 in [place].   Migraine without aura and without status migrainosus, not intractable 10/26/2021   Follows with Dr. Naomie Dean, LOV 04/27/22.   Numbness and tingling of right arm 02/15/2022   w/ confusion, see office visit w/ Shawnie Dapper, NP, neurology, dated 02/15/22 in [place].   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 Pt follows with neurologist, Dr. Naomie Dean at [facility], LOV (video visit) 04/27/22 in Epic. 03/01/22 EEG - normal awake and asleep EEG.   Sinusitis    Stroke [facility]) 09/02/2020   09/02/2020 MRI of brain (in Epic)  revealed multi-focal left occipitotemporal & left cerebellar infarcts. Patient was also found to be hypercoagulable with and elevated anticardiolipen.   Patient Active Problem List   Diagnosis Date Noted   Migraine without aura and without status migrainosus, not intractable 10/26/2021   Witnessed seizure-like activity (HCC) 10/26/2021   Iron deficiency anemia 09/27/2021   Fatigue 06/16/2021   Adnexal mass 05/24/2021   Abnormal uterine bleeding 05/24/2021   History of stroke 05/24/2021   Secondary cardiomyopathy (HCC)    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   Sepsis (HCC) 09/03/2020   Septic shock (HCC) 09/02/2020   Depression 09/02/2020   History of suicidal ideation 09/02/2020   Aspiration pneumonia of left lower lobe (HCC)    Demand ischemia of myocardium (HCC)    Chronic migraine without aura, with intractable migraine, so stated, with status migrainosus 08/06/2018   Home Medication(s) Prior to Admission medications   Medication Sig Start Date End Date Taking? Authorizing Provider  Erenumab-aooe (AIMOVIG) 70  MG/ML SOAJ Inject 70 mg into the skin every 14 (fourteen) days. 10/11/21   Anson Fret, MD  ibuprofen (ADVIL) 800 MG tablet Take 1 tablet (800 mg total) by mouth every 8 (eight) hours as needed for moderate pain or cramping. 05/22/22   Hoover Browns, MD  levETIRAcetam (KEPPRA XR) 500 MG 24 hr tablet TAKE 2 TABLETS(1000 MG) BY MOUTH AT  BEDTIME 04/18/22   Anson Fret, MD  linaclotide (LINZESS) 290 MCG CAPS capsule Take 290 mcg by mouth daily as needed (constipation).    [provider]  oxyCODONE (OXY IR/ROXICODONE) 5 MG immediate release tablet Take 1 tablet (5 mg total) by mouth every 4 (four) hours as needed for severe pain or breakthrough pain. 05/22/22   Hoover Browns, MD  pantoprazole (PROTONIX) 40 MG tablet TAKE 1 TABLET(40 MG) BY MOUTH TWICE DAILY BEFORE A MEAL 06/16/22   Tressia Danas, MD  Ubrogepant (UBRELVY) 100 MG TABS Take 100 mg by mouth every 2 (two) hours as needed. Maximum 200mg  a day. 10/26/21   10/28/21, MD  zolpidem (AMBIEN) 10 MG tablet Take 10 mg by mouth at bedtime as needed for sleep.    [provider]                                                                                                                                    Allergies Amoxicillin, Bee venom, and Penicillins  Review of Systems Review of Systems  Respiratory:  Negative for shortness of breath.   Cardiovascular:  Negative for chest pain.  Gastrointestinal:  Positive for abdominal pain.  Genitourinary:  Positive for flank pain.  Neurological:  Negative for headaches.   As noted in HPI  Physical Exam Vital Signs  I have reviewed the triage vital signs BP 128/82   Pulse (!) 56   Temp 98.4 F (36.9 C)   Resp 18   Ht 5\' 5"  (1.651 m)   Wt 78 kg   SpO2 99%   BMI 28.62 kg/m   Physical Exam Vitals reviewed.  Constitutional:      General: She is not in acute distress.    Appearance: She is well-developed. She is not diaphoretic.  HENT:     Head: Normocephalic and atraumatic.     Right Ear: External ear normal.     Left Ear: External ear normal.     Nose: Nose normal.  Eyes:     General: No scleral icterus.    Conjunctiva/sclera: Conjunctivae normal.  Neck:     Trachea: Phonation normal.  Cardiovascular:     Rate and Rhythm: Normal rate and regular rhythm.  Pulmonary:     Effort:  Pulmonary effort is normal. No respiratory distress.     Breath sounds: No stridor.  Abdominal:     General: There is no distension.     Tenderness: There is abdominal tenderness in the suprapubic area. There is left CVA  tenderness.  Genitourinary:    Comments: Declined pelvic exam Musculoskeletal:        General: Normal range of motion.     Cervical back: Normal range of motion.  Neurological:     Mental Status: She is alert and oriented to person, place, and time.  Psychiatric:        Behavior: Behavior normal.     ED Results and Treatments Labs (all labs ordered are listed, but only abnormal results are displayed) Labs Reviewed  COMPREHENSIVE METABOLIC PANEL - Abnormal; Notable for the following components:      Result Value   Potassium 3.4 (*)    All other components within normal limits  CBC - Abnormal; Notable for the following components:   RBC 5.21 (*)    Hemoglobin 16.4 (*)    HCT 48.2 (*)    All other components within normal limits  URINALYSIS, ROUTINE W REFLEX MICROSCOPIC - Abnormal; Notable for the following components:   Hgb urine dipstick TRACE (*)    Ketones, ur TRACE (*)    All other components within normal limits  LIPASE, BLOOD  PREGNANCY, URINE                                                                                                                         EKG  EKG Interpretation  Date/Time:    Ventricular Rate:    PR Interval:    QRS Duration:   QT Interval:    QTC Calculation:   R Axis:     Text Interpretation:         Radiology CT Renal Stone Study  Result Date: 07/01/2022 CLINICAL DATA:  Flank pain, kidney stone suspected EXAM: CT ABDOMEN AND PELVIS WITHOUT CONTRAST TECHNIQUE: Multidetector CT imaging of the abdomen and pelvis was performed following the standard protocol without IV contrast. RADIATION DOSE REDUCTION: This exam was performed according to the departmental dose-optimization program which includes automated exposure  control, adjustment of the mA and/or kV according to patient size and/or use of iterative reconstruction technique. COMPARISON:  09/10/2017 FINDINGS: Lower chest: No acute abnormality. Hepatobiliary: No focal liver abnormality is seen. No gallstones, gallbladder wall thickening, or biliary dilatation. Pancreas: Unremarkable Spleen: Unremarkable Adrenals/Urinary Tract: Adrenal glands are unremarkable. Kidneys are normal, without renal calculi, focal lesion, or hydronephrosis. Bladder is unremarkable. Stomach/Bowel: Stomach is within normal limits. Appendix appears normal. No evidence of bowel wall thickening, distention, or inflammatory changes. Vascular/Lymphatic: No significant vascular findings are present. No enlarged abdominal or pelvic lymph nodes. Reproductive: Uterus and bilateral adnexa are unremarkable. Other: No abdominal wall hernia or abnormality. No abdominopelvic ascites. Musculoskeletal: No acute or significant osseous findings. IMPRESSION: No acute intra-abdominal pathology identified. No renal or ureteral calculi identified. Electronically Signed   By: Fidela Salisbury M.D.   On: 07/01/2022 03:18    Medications Ordered in ED Medications  iohexol (OMNIPAQUE) 300 MG/ML solution 100 mL (has no administration in time range)  ketorolac (TORADOL) 15 MG/ML injection 15 mg (15  mg Intravenous Given 07/01/22 0253)                                                                                                                                     Procedures Procedures  (including critical care time)  Medical Decision Making / ED Course   Medical Decision Making Amount and/or Complexity of Data Reviewed Labs: ordered. Decision-making details documented in ED Course. Radiology: ordered and independent interpretation performed. Decision-making details documented in ED Course.  Risk Prescription drug management.   Suprapubic and left flank/CVA tenderness. Will assess for urinary  source/infection Considering renal stones as well We will also assess for any intra-abdominal inflammatory/infectious process though I have lower suspicion for this. Also considering postprocedural infection patient is declining GU exam at this time.  Labs and imaging ordered. CBC without leukocytosis or anemia Metabolic panel without significant electrolyte derangement or renal sufficiency No evidence of biliary obstruction or pancreatitis UA with mild hematuria but not consistent with a urinary tract infection Urine pregnancy negative CT negative for any renal stones, or other intra-abdominal inflammatory/infectious processes.  Given the reassuring work-up above I have low suspicion for intra-abdominal inflammatory/infectious process or postprocedural infection.  I did recommend patient follow-up closely with her gynecologist for pelvic exam and reevaluation if symptoms do not resolve.  Patient was treated with IV Toradol which provided some relief.       Final Clinical Impression(s) / ED Diagnoses Final diagnoses:  Left flank discomfort   The patient appears reasonably screened and/or stabilized for discharge and I doubt any other medical condition or other [facility] requiring further screening, evaluation, or treatment in the ED at this time. I have discussed the findings, Dx and Tx plan with the patient/family who expressed understanding and agree(s) with the plan. Discharge instructions discussed at length. The patient/family was given strict return precautions who verbalized understanding of the instructions. No further questions at time of discharge.  Disposition: Discharge  Condition: Good  ED Discharge Orders     None        Follow Up: [name], NP [place] [address] [phone]  Call  to schedule an appointment for close follow up           This chart was dictated using voice recognition software.  Despite best efforts to  proofread,  errors can occur which can change the documentation meaning.    Fatima Blank, MD 07/01/22 [phone]

## 2022-07-01 NOTE — ED Notes (Signed)
Pt agreeable with d/c plan as discussed by provider- this nurse has verbally reinforced d/c instructions and provided pt with written copy; pt acknowledges verbal understanding and denies any addl questions, concerns, needs - pt ambulatory independently at d/c

## 2022-07-01 NOTE — ED Notes (Signed)
Pt awake and alert; GCS 15 - lying on R lateral side.  RR even and unlabored on RA with symmetrical rise and fall of chest.  Abdomen soft, nontender- pt unable to advise on last BM but does report flatus and BS x4 with ausculation.  Ongoing L flank pain rated 10/10.  No active nausea however pt reports 1 day pta had multiple episodes of vomiting. Skin warm dry and intact.  Will monitor for acute changes and maintain plan of care

## 2022-07-02 ENCOUNTER — Telehealth: Payer: Self-pay | Admitting: Nurse Practitioner

## 2022-07-02 DIAGNOSIS — R112 Nausea with vomiting, unspecified: Secondary | ICD-10-CM

## 2022-07-02 MED ORDER — ONDANSETRON HCL 4 MG PO TABS: 4.0000 mg | ORAL_TABLET | Freq: Three times a day (TID) | ORAL | 0 refills | Status: DC | PRN

## 2022-07-02 NOTE — Patient Instructions (Signed)
  [name], thank you for joining Claiborne Rigg, NP for today's virtual visit.  While this provider is not your primary care provider (PCP), if your PCP is located in our provider database this encounter information will be shared with them immediately following your visit.  Consent: (Patient) Tabitha Smith provided verbal consent for this virtual visit at the beginning of the encounter.  Current Medications:  Current Outpatient Medications:    ondansetron (ZOFRAN) 4 MG tablet, Take 1 tablet (4 mg total) by mouth every 8 (eight) hours as needed for nausea or vomiting., Disp: 30 tablet, Rfl: 0   Erenumab-aooe (AIMOVIG) 70 MG/ML SOAJ, Inject 70 mg into the skin every 14 (fourteen) days., Disp: 2 mL, Rfl: 6   ibuprofen (ADVIL) 800 MG tablet, Take 1 tablet (800 mg total) by mouth every 8 (eight) hours as needed for moderate pain or cramping., Disp: 30 tablet, Rfl: 0   levETIRAcetam (KEPPRA XR) 500 MG 24 hr tablet, TAKE 2 TABLETS(1000 MG) BY MOUTH AT BEDTIME, Disp: 60 tablet, Rfl: 3   linaclotide (LINZESS) 290 MCG CAPS capsule, Take 290 mcg by mouth daily as needed (constipation)., Disp: , Rfl:    oxyCODONE (OXY IR/ROXICODONE) 5 MG immediate release tablet, Take 1 tablet (5 mg total) by mouth every 4 (four) hours as needed for severe pain or breakthrough pain., Disp: 30 tablet, Rfl: 0   pantoprazole (PROTONIX) 40 MG tablet, TAKE 1 TABLET(40 MG) BY MOUTH TWICE DAILY BEFORE A MEAL, Disp: 60 tablet, Rfl: 2   Ubrogepant (UBRELVY) 100 MG TABS, Take 100 mg by mouth every 2 (two) hours as needed. Maximum 200mg  a day., Disp: 16 tablet, Rfl: 11   zolpidem (AMBIEN) 10 MG tablet, Take 10 mg by mouth at bedtime as needed for sleep., Disp: , Rfl:    Medications ordered in this encounter:  Meds ordered this encounter  Medications   ondansetron (ZOFRAN) 4 MG tablet    Sig: Take 1 tablet (4 mg total) by mouth every 8 (eight) hours as needed for nausea or vomiting.    Dispense:  30 tablet    Refill:  0     Order Specific Question:   Supervising Provider    Answer:   , BRIAN [[id number]]     *If you need refills on other medications prior to your next appointment, please contact your pharmacy*  Follow-Up: Call back or seek an in-person evaluation if the symptoms worsen or if the condition fails to improve as anticipated.  Other Instructions Return to ED for further evaluation   If you have been instructed to have an in-person evaluation today at a local Urgent Care facility, please use the link below. It will take you to a list of all of our available [facility], including address, phone number and hours of operation. Please do not delay care.  [facility]  If you or a family member do not have a primary care provider, use the link below to schedule a visit and establish care. When you choose a [facility] primary care physician or advanced practice provider, you gain a long-term partner in health. Find a Primary Care Provider  Learn more about [facility]'s in-office and virtual care options:  - Get Care Now

## 2022-07-02 NOTE — Progress Notes (Signed)
Virtual Visit Consent   Tabitha Smith, you are scheduled for a virtual visit with a [facility] provider today. Just as with appointments in the office, your consent must be obtained to participate. Your consent will be active for this visit and any virtual visit you may have with one of our providers in the next 365 days. If you have a MyChart account, a copy of this consent can be sent to you electronically.  As this is a virtual visit, video technology does not allow for your provider to perform a traditional examination. This may limit your provider's ability to fully assess your condition. If your provider identifies any concerns that need to be evaluated in person or the need to arrange testing (such as labs, EKG, etc.), we will make arrangements to do so. Although advances in technology are sophisticated, we cannot ensure that it will always work on either your end or our end. If the connection with a video visit is poor, the visit may have to be switched to a telephone visit. With either a video or telephone visit, we are not always able to ensure that we have a secure connection.  By engaging in this virtual visit, you consent to the provision of healthcare and authorize for your insurance to be billed (if applicable) for the services provided during this visit. Depending on your insurance coverage, you may receive a charge related to this service.  I need to obtain your verbal consent now. Are you willing to proceed with your visit today? Tabitha Smith has provided verbal consent on 07/02/2022 for a virtual visit (video or telephone). Tabitha Rigg, NP  Date: 07/02/2022 8:07 AM  Virtual Visit via Video Note   I, Tabitha Smith, connected with  Tabitha Smith  ([id number], December 28, 1990) on 07/02/22 at  8:00 AM EDT by a video-enabled telemedicine application and verified that I am speaking with the correct person using two identifiers.  Location: Patient: Virtual Visit Location Patient:  Home Provider: Virtual Visit Location Provider: Home Office   I discussed the limitations of evaluation and management by telemedicine and the availability of in person appointments. The patient expressed understanding and agreed to proceed.    History of Present Illness: Tabitha Smith is a 31 y.o. who identifies as a female who was assigned female at birth, and is being seen today for left flank pain  Tabitha Smith was seen in the ED on 07-01-2022 (yesterday) for left flank pain. She had D&C/ hysteroscopy (post endometrial ablation 05-22-2022 (declined GU exam in ED. Noted for suprapubic and left flank pain with CVA tenderness.  Instructed to follow up with GYN as soon as possible to rulue of any post procedural infection. CBC with no leukocytosis. UA not consistent for UTI. CT neg for renal stones or other intra-abdominal inflammatory/infectious processes.  Patient was treated with IV Toradol which provided some relief.   Today she reports nausea/vomiting and left flank pain unchanged in intensity. Associated symptoms: Headache. Currently taking motrin 800 mg with little relief. Denies hematemesis, melena or hematochezia     Problems:  Patient Active Problem List   Diagnosis Date Noted   Migraine without aura and without status migrainosus, not intractable 10/26/2021   Witnessed seizure-like activity (HCC) 10/26/2021   Iron deficiency anemia 09/27/2021   Fatigue 06/16/2021   Adnexal mass 05/24/2021   Abnormal uterine bleeding 05/24/2021   History of stroke 05/24/2021   Secondary cardiomyopathy (HCC)    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   Sepsis (  HCC) 09/03/2020   Septic shock (HCC) 09/02/2020   Depression 09/02/2020   History of suicidal ideation 09/02/2020   Aspiration pneumonia of left lower lobe (HCC)    Demand ischemia of myocardium (HCC)    Chronic migraine without aura, with intractable migraine, so stated, with status migrainosus 08/06/2018    Allergies:   Allergies  Allergen Reactions   Amoxicillin Anaphylaxis   Bee Venom    Penicillins Anaphylaxis    Has patient had a PCN reaction causing immediate rash, facial/tongue/throat swelling, SOB or lightheadedness with hypotension: Yes Has patient had a PCN reaction causing severe rash involving mucus membranes or skin necrosis: Unk Has patient had a PCN reaction that required hospitalization: No Has patient had a PCN reaction occurring within the last 10 years: No If all of the above answers are "NO", then may proceed with Cephalosporin use.    Medications:  Current Outpatient Medications:    ondansetron (ZOFRAN) 4 MG tablet, Take 1 tablet (4 mg total) by mouth every 8 (eight) hours as needed for nausea or vomiting., Disp: 30 tablet, Rfl: 0   Erenumab-aooe (AIMOVIG) 70 MG/ML SOAJ, Inject 70 mg into the skin every 14 (fourteen) days., Disp: 2 mL, Rfl: 6   ibuprofen (ADVIL) 800 MG tablet, Take 1 tablet (800 mg total) by mouth every 8 (eight) hours as needed for moderate pain or cramping., Disp: 30 tablet, Rfl: 0   levETIRAcetam (KEPPRA XR) 500 MG 24 hr tablet, TAKE 2 TABLETS(1000 MG) BY MOUTH AT BEDTIME, Disp: 60 tablet, Rfl: 3   linaclotide (LINZESS) 290 MCG CAPS capsule, Take 290 mcg by mouth daily as needed (constipation)., Disp: , Rfl:    oxyCODONE (OXY IR/ROXICODONE) 5 MG immediate release tablet, Take 1 tablet (5 mg total) by mouth every 4 (four) hours as needed for severe pain or breakthrough pain., Disp: 30 tablet, Rfl: 0   pantoprazole (PROTONIX) 40 MG tablet, TAKE 1 TABLET(40 MG) BY MOUTH TWICE DAILY BEFORE A MEAL, Disp: 60 tablet, Rfl: 2   Ubrogepant (UBRELVY) 100 MG TABS, Take 100 mg by mouth every 2 (two) hours as needed. Maximum 200mg  a day., Disp: 16 tablet, Rfl: 11   zolpidem (AMBIEN) 10 MG tablet, Take 10 mg by mouth at bedtime as needed for sleep., Disp: , Rfl:   Observations/Objective: Patient is well-developed, well-nourished. Moaning during video visit Head is normocephalic,  atraumatic.  No labored breathing.  Speech is clear and coherent with logical content.  Patient is alert and oriented at baseline.    Assessment and Plan: 1. Nausea and vomiting, unspecified vomiting type - ondansetron (ZOFRAN) 4 MG tablet; Take 1 tablet (4 mg total) by mouth every 8 (eight) hours as needed for nausea or vomiting.  Dispense: 30 tablet; Refill: 0 Return to ED for further evaluation  Follow Up Instructions: I discussed the assessment and treatment plan with the patient. The patient was provided an opportunity to ask questions and all were answered. The patient agreed with the plan and demonstrated an understanding of the instructions.  A copy of instructions were sent to the patient via MyChart unless otherwise noted below.    The patient was advised to call back or seek an in-person evaluation if the symptoms worsen or if the condition fails to improve as anticipated.  Time:  I spent 11 minutes with the patient via telehealth technology discussing the above problems/concerns.    , NP

## 2022-07-09 IMAGING — CT CT ANGIO HEAD
1 of 8 series · 14 of 47 positions shown · IV contrast (omnipaque)
Comparison: Chest CTA 09/02/2020.

CLINICAL DATA: Acute left cerebellar and left temporo-occipital
infarcts on MRI.

EXAM:
CT ANGIOGRAPHY HEAD AND NECK
TECHNIQUE: Multidetector CT imaging of the head and neck was performed using
the standard protocol during bolus administration of intravenous
contrast. Multiplanar CT image reconstructions and MIPs were
obtained to evaluate the vascular anatomy. Carotid stenosis
measurements (when applicable) are obtained utilizing NASCET
criteria, using the distal internal carotid diameter as the
denominator.
CONTRAST:  75mL OMNIPAQUE IOHEXOL 350 MG/ML SOLN

[Series 12: thin · axial · 0.45mm/px · z∈[-368,-56]mm · 14 of 723 slices shown]
[im 49/723  brain]
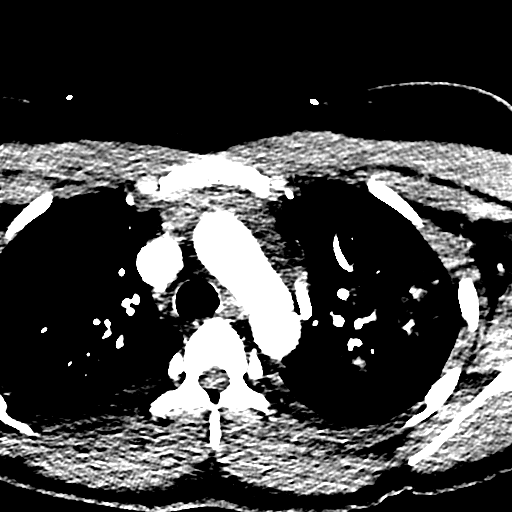
[im 97/723  bone]
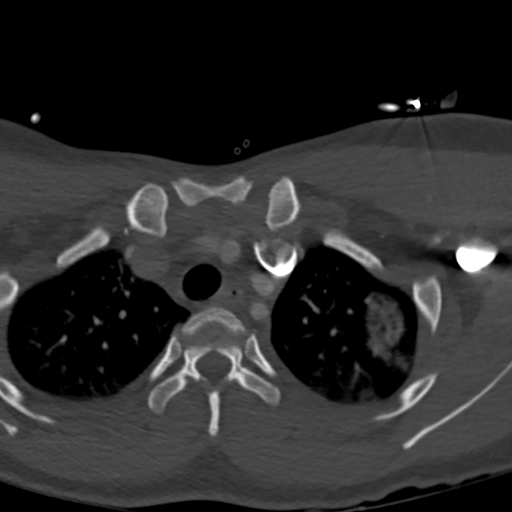
[im 145/723  brain]
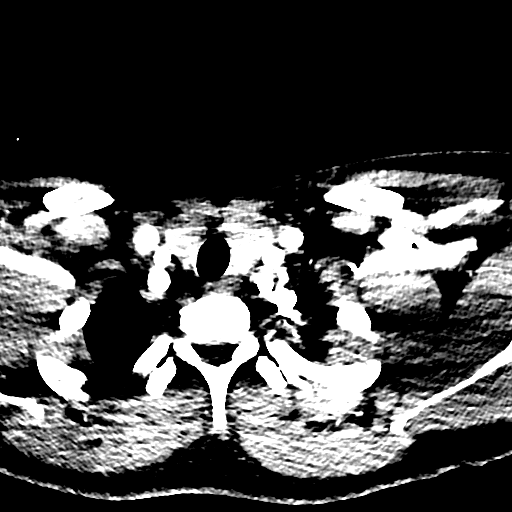
[im 193/723  bone]
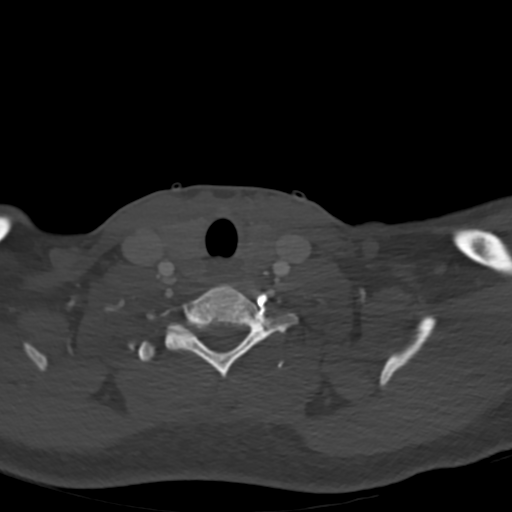
[im 241/723  brain]
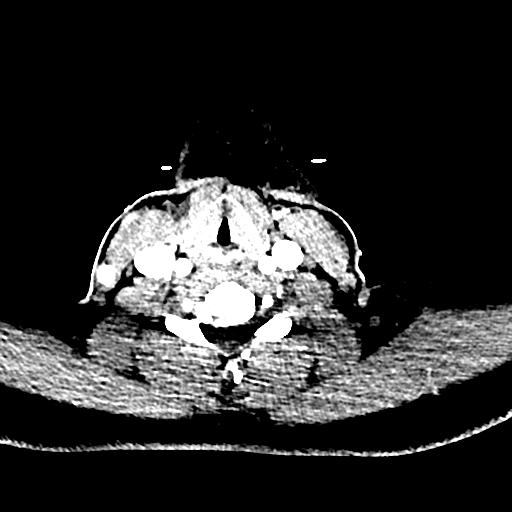
[im 289/723  bone]
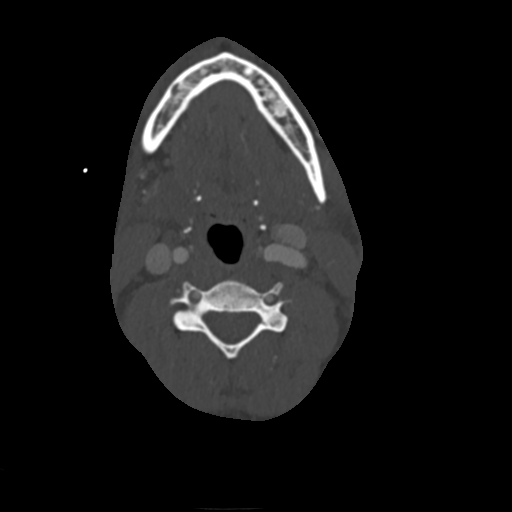
[im 337/723  brain]
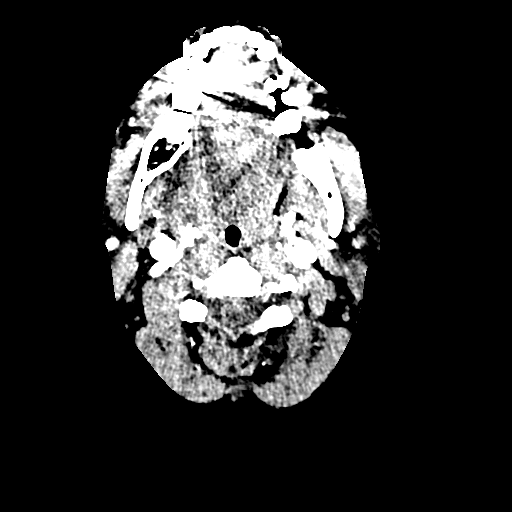
[im 386/723  bone]
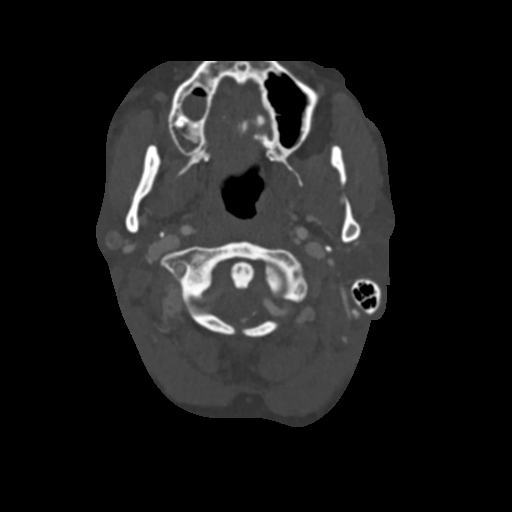
[im 434/723  brain]
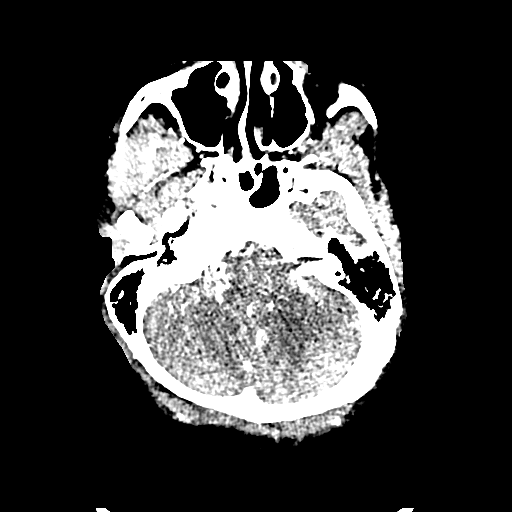
[im 482/723  bone]
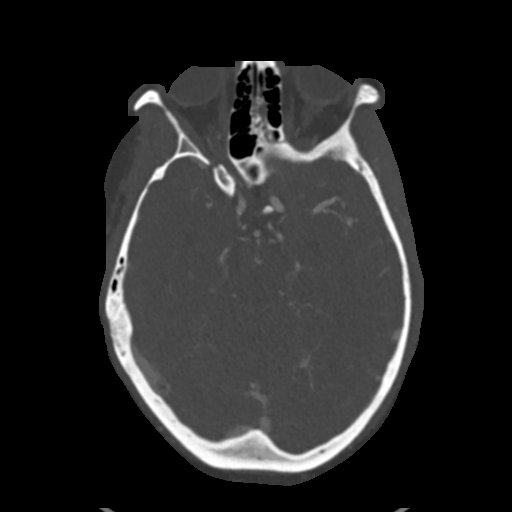
[im 530/723  brain]
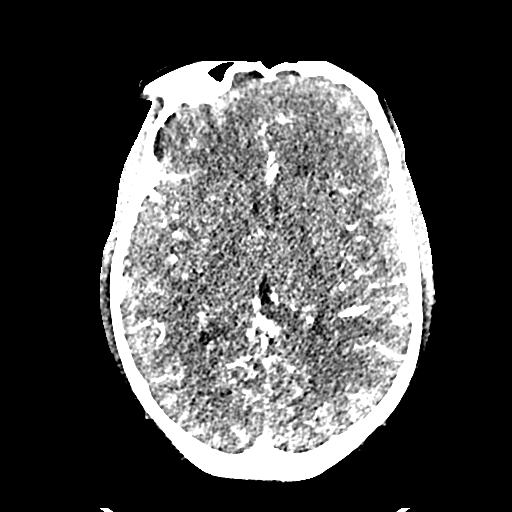
[im 578/723  bone]
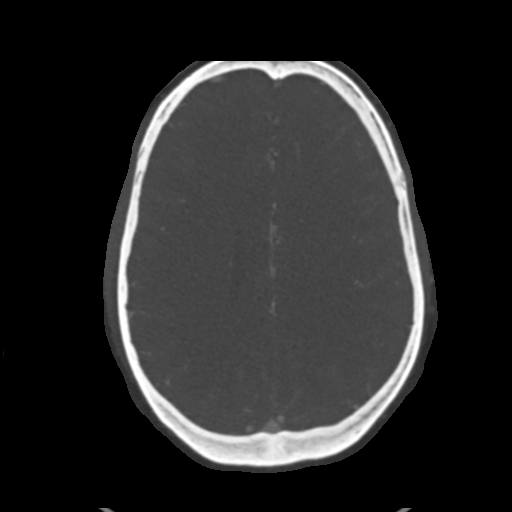
[im 626/723  brain]
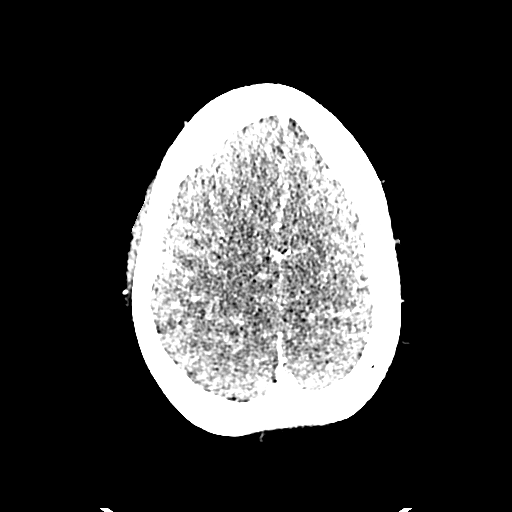
[im 674/723  bone]
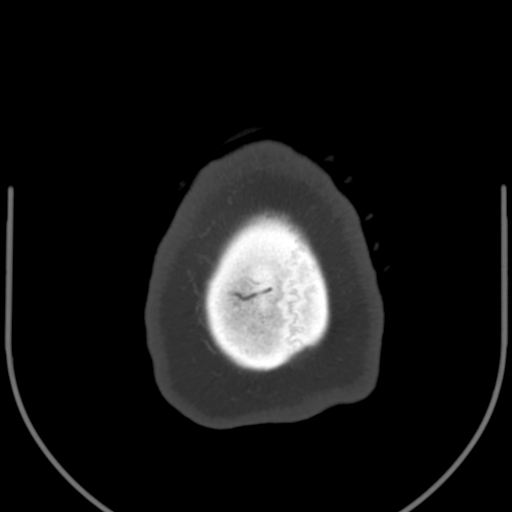

[14 of 47 positions shown; findings below may reference images not displayed]

FINDINGS: CTA NECK FINDINGS

Aortic arch: Normal variant aortic arch branching pattern with
common origin of the brachiocephalic and left common carotid
arteries. Widely patent arch vessel origins.

Right carotid system: Patent without evidence of stenosis,
dissection, or significant atherosclerosis.

Left carotid system: Patent without evidence of stenosis,
dissection, or significant atherosclerosis.

Vertebral arteries: Patent without evidence of stenosis, dissection,
or significant atherosclerosis. Codominant.

Skeleton: No acute osseous abnormality or suspicious osseous lesion.

Other neck: No evidence of cervical lymphadenopathy or mass.

Upper chest: Patchy ground-glass opacities in the visualized
portions of the left upper and left lower lobes, similar in
appearance to yesterday's chest CTA. Small bilateral pleural
effusions.

Review of the MIP images confirms the above findings

CTA HEAD FINDINGS

Anterior circulation: The internal carotid arteries are widely
patent from skull base to carotid termini. ACAs and MCAs are patent
without evidence of a proximal branch occlusion or significant
proximal stenosis. No aneurysm is identified.

Posterior circulation: The intracranial vertebral arteries are
widely patent to the basilar. The left PICA and right AICA appear
dominant. Patent SCA is are seen bilaterally. The basilar artery is
widely patent. There are left larger than right posterior
communicating arteries. Both PCAs are patent without evidence of a
significant proximal stenosis. No aneurysm is identified.

Venous sinuses: Patent.

Anatomic variants: None.

Review of the MIP images confirms the above findings
IMPRESSION: 1. No large vessel occlusion or significant stenosis in the head and
neck.
2. Patchy ground-glass opacities in the left lung, more fully
evaluated on yesterday's chest CTA. Small bilateral pleural
effusions.

## 2022-08-09 ENCOUNTER — Encounter: Payer: Self-pay | Admitting: *Deleted

## 2022-08-09 ENCOUNTER — Telehealth: Payer: Self-pay | Admitting: *Deleted

## 2022-08-09 NOTE — Telephone Encounter (Signed)
[employer] PA, Key: [id number], member not found. Sent my chart, requested she send new insurance to [place].

## 2022-09-28 ENCOUNTER — Inpatient Hospital Stay: Payer: Self-pay | Attending: Hematology and Oncology

## 2022-09-28 ENCOUNTER — Other Ambulatory Visit: Payer: Self-pay | Admitting: Hematology and Oncology

## 2022-09-28 ENCOUNTER — Inpatient Hospital Stay (HOSPITAL_BASED_OUTPATIENT_CLINIC_OR_DEPARTMENT_OTHER): Payer: Self-pay | Admitting: Hematology and Oncology

## 2022-09-28 ENCOUNTER — Other Ambulatory Visit: Payer: Self-pay

## 2022-09-28 VITALS — BP 128/74 | HR 84 | Temp 99.0°F | Resp 15 | Wt 174.8 lb

## 2022-09-28 DIAGNOSIS — D509 Iron deficiency anemia, unspecified: Secondary | ICD-10-CM

## 2022-09-28 DIAGNOSIS — R768 Other specified abnormal immunological findings in serum: Secondary | ICD-10-CM | POA: Insufficient documentation

## 2022-09-28 DIAGNOSIS — Z8673 Personal history of transient ischemic attack (TIA), and cerebral infarction without residual deficits: Secondary | ICD-10-CM | POA: Insufficient documentation

## 2022-09-28 DIAGNOSIS — F17210 Nicotine dependence, cigarettes, uncomplicated: Secondary | ICD-10-CM | POA: Insufficient documentation

## 2022-09-28 DIAGNOSIS — N939 Abnormal uterine and vaginal bleeding, unspecified: Secondary | ICD-10-CM | POA: Insufficient documentation

## 2022-09-28 DIAGNOSIS — Z800 Family history of malignant neoplasm of digestive organs: Secondary | ICD-10-CM | POA: Insufficient documentation

## 2022-09-28 DIAGNOSIS — R5383 Other fatigue: Secondary | ICD-10-CM

## 2022-09-28 DIAGNOSIS — D500 Iron deficiency anemia secondary to blood loss (chronic): Secondary | ICD-10-CM | POA: Insufficient documentation

## 2022-09-28 DIAGNOSIS — D6859 Other primary thrombophilia: Secondary | ICD-10-CM | POA: Insufficient documentation

## 2022-09-28 LAB — CBC WITH DIFFERENTIAL (CANCER CENTER ONLY)
Abs Immature Granulocytes: 0.02 10*3/uL (ref 0.00–0.07)
Basophils Absolute: 0 10*3/uL (ref 0.0–0.1)
Basophils Relative: 0 %
Eosinophils Absolute: 0.2 10*3/uL (ref 0.0–0.5)
Eosinophils Relative: 2 %
HCT: 36.6 % (ref 36.0–46.0)
Hemoglobin: 12.7 g/dL (ref 12.0–15.0)
Immature Granulocytes: 0 %
Lymphocytes Relative: 33 %
Lymphs Abs: 3 10*3/uL (ref 0.7–4.0)
MCH: 31.6 pg (ref 26.0–34.0)
MCHC: 34.7 g/dL (ref 30.0–36.0)
MCV: 91 fL (ref 80.0–100.0)
Monocytes Absolute: 0.6 10*3/uL (ref 0.1–1.0)
Monocytes Relative: 7 %
Neutro Abs: 5.2 10*3/uL (ref 1.7–7.7)
Neutrophils Relative %: 58 %
Platelet Count: 253 10*3/uL (ref 150–400)
RBC: 4.02 MIL/uL (ref 3.87–5.11)
RDW: 15 % (ref 11.5–15.5)
WBC Count: 9.1 10*3/uL (ref 4.0–10.5)
nRBC: 0 % (ref 0.0–0.2)

## 2022-09-28 LAB — CMP (CANCER CENTER ONLY)
ALT: 31 U/L (ref 0–44)
AST: 20 U/L (ref 15–41)
Albumin: 4.6 g/dL (ref 3.5–5.0)
Alkaline Phosphatase: 56 U/L (ref 38–126)
Anion gap: 7 (ref 5–15)
BUN: 9 mg/dL (ref 6–20)
CO2: 28 mmol/L (ref 22–32)
Calcium: 9.7 mg/dL (ref 8.9–10.3)
Chloride: 104 mmol/L (ref 98–111)
Creatinine: 0.88 mg/dL (ref 0.44–1.00)
GFR, Estimated: >60 mL/min (ref >60)
Glucose, Bld: 101 mg/dL — ABNORMAL HIGH (ref 70–99)
Potassium: 3.4 mmol/L — ABNORMAL LOW (ref 3.5–5.1)
Sodium: 139 mmol/L (ref 135–145)
Total Bilirubin: 0.3 mg/dL (ref 0.3–1.2)
Total Protein: 7.7 g/dL (ref 6.5–8.1)

## 2022-09-28 LAB — IRON AND IRON BINDING CAPACITY (CC-WL,HP ONLY)
Iron: 52 ug/dL (ref 28–170)
Saturation Ratios: 14 % (ref 10.4–31.8)
TIBC: 385 ug/dL (ref 250–450)
UIBC: 333 ug/dL (ref 148–442)

## 2022-09-28 LAB — RETIC PANEL
Immature Retic Fract: 7.6 % (ref 2.3–15.9)
RBC.: 4 MIL/uL (ref 3.87–5.11)
Retic Count, Absolute: 48.8 10*3/uL (ref 19.0–186.0)
Retic Ct Pct: 1.2 % (ref 0.4–3.1)
Reticulocyte Hemoglobin: 34.7 pg (ref >27.9)

## 2022-09-28 NOTE — Progress Notes (Signed)
[place] [phone]   [phone]  PROGRESS NOTE  Patient Care Team: [name], NP as PCP - General (Nurse Practitioner) Donato Heinz, MD as PCP - Cardiology (Cardiology)  Hematological/Oncological History  #Iron Deficiency Anemia 2/2 to GYN Bleeding 1) Admitted from 80/06/9832-82/02/538 for embolic stroke. MRI brain revealed multifocal L occipitotemporal and L cerebellar infarcts.She was discharged on Aspirin and Plavix. Hypercoagulable workup was positive for mild elevation of anticardiolipin Igm at 19 and decreased protein S activity at 37%.  2) 05/26/2021: Repeat cardiolipin antibody panel revealed mild increase in anticardiolipin IgM at 28.  3) 06/16/2021: Establish care with [name] PA-C  4) 10/05/2021-10/12/2021: Received IV Feraheme 510 mg x 2 doses  5) 09/28/2022: White blood cell 9.1, hemoglobin 12.7, MCV 91, and platelets 253  HISTORY OF PRESENTING ILLNESS:  Tabitha Smith 31 y.o. female returns for follow-up for iron deficiency anemia.   On exam today, Ms. Wieser reports she continues to have trouble with tiredness, sleepiness, and hunger.  She reports that she feels like she can take a nap right now.  She reports that she is constantly up throughout the night and typically only sleeps about 4 AM to 6 AM.  She reports that she just cannot get a full sleep.  She has done all sorts of sleep hygiene techniques such as removing the TV from her room and trying to take warm baths, white noise, sleep sounds, and melatonin.  None of these has helped.  She has had Ambien before which helps her sleep about 4-1/2 to 5 hours.  She is currently scheduled for a home sleep study next week.  She reports that she is not currently taking her iron pills.  She notes that she last received iron from her PCP in May though her levels based on our records appeared normal at that time.  She denies any fevers, chills, night sweats, shortness of  breath, chest pain, cough. She has no other complaints. Rest of the 10 point ROS is below.   MEDICAL HISTORY:  Past Medical History:  Diagnosis Date   Abnormal uterine bleeding 2023   Adnexal mass    Anxiety    Depression    w/ hx of suidical ideation   History of acute heart failure 08/2020   in the setting of septic shock and acute CVA after having a root canal   history of CHF (congestive heart failure) ([place]) 76/7341   acute systolic and diastolic heart failure in the setting of septic shock and acute CVA. LOV with cardiologist Dr. Oswaldo Milian on 01/11/21 in Epic (f/u as needed).   Hx of septic shock 08/2020   Hospital admission 09/02/20 to 09/07/20, pt has a root canal about 2 days before hospital admission   IBS (irritable bowel syndrome)    Iron deficiency anemia    hx of IV iron, Pt follows with Dr. Narda Rutherford, Hematology / Oncology, lov 03/29/22 in [place].   Migraine without aura and without status migrainosus, not intractable 10/26/2021   Follows with Dr. Sarina Ill, [place] 04/27/22.   Numbness and tingling of right arm 02/15/2022   w/ confusion, see office visit w/ Debbora Presto, NP, neurology, dated 02/15/22 in [place].   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 Pt follows with neurologist, Dr.  Sarina Ill at [facility], LOV (video visit) 04/27/22 in Epic. 03/01/22 EEG - normal awake and asleep EEG.   Sinusitis    Stroke [facility]) 09/02/2020   09/02/2020 MRI of brain (in Epic)  revealed multi-focal left occipitotemporal & left cerebellar infarcts. Patient was also found to be hypercoagulable with and elevated anticardiolipen.    SURGICAL HISTORY: Past Surgical History:  Procedure Laterality Date   BUBBLE STUDY  09/06/2020   Procedure: BUBBLE STUDY;   Surgeon: Fay Records, MD;  Location: [place];  Service: Cardiovascular;;   COLONOSCOPY WITH ESOPHAGOGASTRODUODENOSCOPY (EGD)  01/10/2022   DILATION AND CURETTAGE OF UTERUS N/A 06/14/2021   Procedure: DILATATION AND CURETTAGE;  Surgeon: Lafonda Mosses, MD;  Location: [facility];  Service: Gynecology;  Laterality: N/A;   DILITATION & CURRETTAGE/HYSTROSCOPY WITH NOVASURE ABLATION N/A 05/22/2022   Procedure: DILATATION & CURETTAGE/HYSTEROSCOPY WITH NOVASURE ABLATION;  Surgeon: Waymon Amato, MD;  Location: [place];  Service: Gynecology;  Laterality: N/A;   INTRAUTERINE DEVICE (IUD) INSERTION N/A 06/14/2021   Procedure: [place] (IUD) INSERTION;  Surgeon: Lafonda Mosses, MD;  Location: [facility];  Service: Gynecology;  Laterality: N/A;   IUD REMOVAL N/A 05/22/2022   Procedure: INTRAUTERINE DEVICE (IUD) REMOVAL;  Surgeon: Waymon Amato, MD;  Location: [place];  Service: Gynecology;  Laterality: N/A;   stitches to abdomen      for a cut    TEE WITHOUT CARDIOVERSION N/A 09/06/2020   Procedure: TRANSESOPHAGEAL ECHOCARDIOGRAM (TEE);  Surgeon: Fay Records, MD;  Location: [facility];  Service: Cardiovascular;  Laterality: N/A;    SOCIAL HISTORY: Social History   Socioeconomic History   Marital status: Single    Spouse name: Not on file   Number of children: 1   Years of education: Not on file   Highest education level: High school graduate  Occupational History   Not on file  Tobacco Use   Smoking status: Every Day    Packs/day: 0.25    Years: 12.00    Total pack years: 3.00    Types: Cigarettes   Smokeless tobacco: Never  Vaping Use   Vaping Use: Never used  Substance and Sexual Activity   Alcohol use: Yes    Comment: occasionally   Drug use: Never   Sexual activity: Yes    Birth control/protection: I.U.D.  Other Topics Concern   Not on file  Social History Narrative   Lives at home with family   Left handed   Caffeine:  coffee, 2-4 cups/day   Social Determinants of Health   Financial Resource Strain: Not on file  Food Insecurity: Not on file  Transportation Needs: Not on file  Physical Activity: Not on file  Stress: Not on file  Social Connections: Not on file  Intimate Partner Violence: Not on file    FAMILY HISTORY: Family History  Problem Relation Age of Onset   Diabetes Mother    Seizures Mother    Cancer Mother        Colon   Hypertension Mother    Stroke Mother    Migraines Neg Hx    Headache Neg Hx    Breast cancer Neg Hx    Ovarian cancer Neg Hx    Endometrial cancer Neg Hx    Pancreatic cancer Neg Hx    Prostate cancer Neg Hx     ALLERGIES:  is allergic to amoxicillin, bee venom, and penicillins.  MEDICATIONS:  Current Outpatient Medications  Medication Sig Dispense  Refill   Erenumab-aooe (AIMOVIG) 70 MG/ML SOAJ Inject 70 mg into the skin every 14 (fourteen) days. 2 mL 6   ibuprofen (ADVIL) 800 MG tablet Take 1 tablet (800 mg total) by mouth every 8 (eight) hours as needed for moderate pain or cramping. 30 tablet 0   levETIRAcetam (KEPPRA XR) 500 MG 24 hr tablet TAKE 2 TABLETS(1000 MG) BY MOUTH AT BEDTIME 60 tablet 3   linaclotide (LINZESS) 290 MCG CAPS capsule Take 290 mcg by mouth daily as needed (constipation).     ondansetron (ZOFRAN) 4 MG tablet Take 1 tablet (4 mg total) by mouth every 8 (eight) hours as needed for nausea or vomiting. 30 tablet 0   oxyCODONE (OXY IR/ROXICODONE) 5 MG immediate release tablet Take 1 tablet (5 mg total) by mouth every 4 (four) hours as needed for severe pain or breakthrough pain. 30 tablet 0   pantoprazole (PROTONIX) 40 MG tablet TAKE 1 TABLET(40 MG) BY MOUTH TWICE DAILY BEFORE A MEAL 60 tablet 2   Ubrogepant (UBRELVY) 100 MG TABS Take 100 mg by mouth every 2 (two) hours as needed. Maximum 240m a day. 16 tablet 11   zolpidem (AMBIEN) 10 MG tablet Take 10 mg by mouth at bedtime as needed for sleep.     No current facility-administered  medications for this visit.    REVIEW OF SYSTEMS:   Constitutional: ( - ) fevers, ( - )  chills , ( - ) night sweats Eyes: ( - ) blurriness of vision, ( - ) double vision, ( - ) watery eyes Ears, nose, mouth, throat, and face: ( - ) mucositis, ( - ) sore throat Respiratory: ( - ) cough, ( - ) dyspnea, ( - ) wheezes Cardiovascular: ( - ) palpitation, ( - ) chest discomfort, ( - ) lower extremity swelling Gastrointestinal:  ( - ) nausea, ( - ) heartburn, ( - ) change in bowel habits Skin: ( - ) abnormal skin rashes Lymphatics: ( - ) new lymphadenopathy, ( - ) easy bruising Neurological: ( - ) numbness, ( - ) tingling, ( - ) new weaknesses Behavioral/Psych: ( - ) mood change, ( - ) new changes  All other systems were reviewed with the patient and are negative.  PHYSICAL EXAMINATION: ECOG PERFORMANCE STATUS: 1 - Symptomatic but completely ambulatory  Vitals:   09/28/22 1434  BP: 128/74  Pulse: 84  Resp: 15  Temp: 99 F (37.2 C)  SpO2: 100%   Filed Weights   09/28/22 1434  Weight: 174 lb 12.8 oz (79.3 kg)    GENERAL: well appearing African American female in NAD  SKIN: skin color, texture, turgor are normal, no rashes or significant lesions EYES: conjunctiva are pink and non-injected, sclera clear LUNGS: clear to auscultation and percussion with normal breathing effort HEART: regular rate & rhythm and no murmurs and no lower extremity edema ABDOMEN: soft, non-tender, non-distended, normal bowel sounds Musculoskeletal: no cyanosis of digits and no clubbing  PSYCH: alert & oriented x 3, fluent speech NEURO: no focal motor/sensory deficits  LABORATORY DATA:  I have reviewed the data as listed    Latest Ref Rng & Units 09/28/2022    2:15 PM 06/30/2022   11:31 PM 05/18/2022   11:23 AM  CBC  WBC 4.0 - 10.5 K/uL 9.1  8.6  8.0   Hemoglobin 12.0 - 15.0 g/dL 12.7  16.4  16.2   Hematocrit 36.0 - 46.0 % 36.6  48.2  47.6   Platelets 150 - 400 K/uL 253  277  279        Latest Ref  Rng & Units 09/28/2022    2:15 PM 06/30/2022   11:31 PM 05/18/2022   11:23 AM  CMP  Glucose 70 - 99 mg/dL 101  94  103   BUN 6 - 20 mg/dL _0 Creatinine 0.44 - 1.00 mg/dL 0.88  0.88  0.81   Sodium 135 - 145 mmol/L 139  140  137   Potassium 3.5 - 5.1 mmol/L 3.4  3.4  3.3   Chloride 98 - 111 mmol/L 104  106  103   CO2 22 - 32 mmol/L _1 Calcium 8.9 - 10.3 mg/dL 9.7  10.0  9.0   Total Protein 6.5 - 8.1 g/dL 7.7  7.2    Total Bilirubin 0.3 - 1.2 mg/dL 0.3  0.4    Alkaline Phos 38 - 126 U/L 56  54    AST 15 - 41 U/L 20  16    ALT 0 - 44 U/L 31  13      ASSESSMENT & PLAN Lue Sykora is a 31 y.o. female returns for a follow up for iron deficiency anemia.   #Iron deficiency anemia 2/2 to GYN Bleeding: -- Not currently on p.o. iron therapy. --Received IV feraheme 510 mg x 2 doses on 10/05/2021 and 10/12/2021: --Since patient reports very light menstrual periods, made referral to GI to rule out malabsorption versus GI bleeding.  Patient has met with GI and establish care with Dr. Tarri Glenn.  Underwent colonoscopy on 01/10/2022 with adequate prep.  Plan for repeat colonoscopy in 2 years time. --Labs today show anemia has resolved with Hgb 12.6, white blood cell 9.1, MCV 91.0, and platelets of 253 --RTC as needed.  Recommend continued follow-up with primary care provider.  #Elevated anticardiolipin IgM level: --Reviewed most recent evated anticardiolipin IgM level of 28. Prior level was 19.  --Does not meet criteria for antiphospholipid antibody syndrome --No further intervention is required.   #Decreased protein S activity: --Decreased to 37% in November 2021 when admitted for CVA --Repeat Protein S level and activity was checked on 06/16/2021 and was within normal limits.  --No further intervention is required.   #Fatigue: --Advised to follow up with PCP to evaluate for other causes of fatigue.  --Potentially some as result of side effect of antiseizure medications.  No  orders of the defined types were placed in this encounter.   All questions were answered. The patient knows to call the clinic with any problems, questions or concerns.  I have spent a total of 30 minutes minutes of face-to-face and non-face-to-face time, preparing to see the patient, obtaining and/or reviewing separately obtained history, performing a medically appropriate examination, counseling and educating the patient, ordering medications,  documenting clinical information in the electronic health record, and care coordination.   Ledell Peoples, MD Department of Hematology/Oncology [place] at [facility] Phone: [phone] Pager: [phone] Email: [email or web address]

## 2022-09-29 LAB — FERRITIN: Ferritin: 127 ng/mL (ref 11–307)

## 2023-01-15 ENCOUNTER — Emergency Department (HOSPITAL_COMMUNITY)
Admission: EM | Admit: 2023-01-15 | Discharge: 2023-01-15 | Disposition: A | Discharge status: Home / Self Care | Payer: Self-pay | Attending: Emergency Medicine | Admitting: Emergency Medicine

## 2023-01-15 ENCOUNTER — Other Ambulatory Visit: Payer: Self-pay

## 2023-01-15 ENCOUNTER — Emergency Department (HOSPITAL_COMMUNITY): Payer: Self-pay

## 2023-01-15 DIAGNOSIS — I509 Heart failure, unspecified: Secondary | ICD-10-CM | POA: Insufficient documentation

## 2023-01-15 DIAGNOSIS — R0789 Other chest pain: Secondary | ICD-10-CM

## 2023-01-15 DIAGNOSIS — Z8673 Personal history of transient ischemic attack (TIA), and cerebral infarction without residual deficits: Secondary | ICD-10-CM | POA: Insufficient documentation

## 2023-01-15 LAB — CBC WITH DIFFERENTIAL/PLATELET
Abs Immature Granulocytes: 0.02 10*3/uL (ref 0.00–0.07)
Basophils Absolute: 0 10*3/uL (ref 0.0–0.1)
Basophils Relative: 0 %
Eosinophils Absolute: 0.1 10*3/uL (ref 0.0–0.5)
Eosinophils Relative: 1 %
HCT: 41.5 % (ref 36.0–46.0)
Hemoglobin: 14.1 g/dL (ref 12.0–15.0)
Immature Granulocytes: 0 %
Lymphocytes Relative: 26 %
Lymphs Abs: 2 10*3/uL (ref 0.7–4.0)
MCH: 30.9 pg (ref 26.0–34.0)
MCHC: 34 g/dL (ref 30.0–36.0)
MCV: 90.8 fL (ref 80.0–100.0)
Monocytes Absolute: 0.6 10*3/uL (ref 0.1–1.0)
Monocytes Relative: 8 %
Neutro Abs: 4.8 10*3/uL (ref 1.7–7.7)
Neutrophils Relative %: 65 %
Platelets: 221 10*3/uL (ref 150–400)
RBC: 4.57 MIL/uL (ref 3.87–5.11)
RDW: 12.2 % (ref 11.5–15.5)
WBC: 7.5 10*3/uL (ref 4.0–10.5)
nRBC: 0 % (ref 0.0–0.2)

## 2023-01-15 LAB — BASIC METABOLIC PANEL
Anion gap: 5 (ref 5–15)
BUN: 8 mg/dL (ref 6–20)
CO2: 24 mmol/L (ref 22–32)
Calcium: 8.9 mg/dL (ref 8.9–10.3)
Chloride: 106 mmol/L (ref 98–111)
Creatinine, Ser: 0.87 mg/dL (ref 0.44–1.00)
GFR, Estimated: >60 mL/min (ref >60)
Glucose, Bld: 93 mg/dL (ref 70–99)
Potassium: 3.6 mmol/L (ref 3.5–5.1)
Sodium: 135 mmol/L (ref 135–145)

## 2023-01-15 LAB — I-STAT BETA HCG BLOOD, ED (MC, WL, AP ONLY): I-stat hCG, quantitative: <5 m[IU]/mL (ref <5)

## 2023-01-15 LAB — TROPONIN I (HIGH SENSITIVITY): Troponin I (High Sensitivity): 3 ng/L (ref <18)

## 2023-01-15 LAB — MAGNESIUM: Magnesium: 2.2 mg/dL (ref 1.7–2.4)

## 2023-01-15 MED ORDER — ASPIRIN 325 MG PO TABS
325.0000 mg | ORAL_TABLET | Freq: Once | ORAL | Status: AC
  Administered 2023-01-15: 325 mg via ORAL
  Filled 2023-01-15: qty 1

## 2023-01-15 MED ORDER — ONDANSETRON HCL 4 MG/2ML IJ SOLN
4.0000 mg | Freq: Once | INTRAMUSCULAR | Status: AC
  Administered 2023-01-15: 4 mg via INTRAVENOUS
  Filled 2023-01-15: qty 2

## 2023-01-15 MED ORDER — SODIUM CHLORIDE (PF) 0.9 % IJ SOLN
INTRAMUSCULAR | Status: AC
  Filled 2023-01-15: qty 50

## 2023-01-15 MED ORDER — NITROGLYCERIN 0.4 MG SL SUBL: 0.4000 mg | SUBLINGUAL_TABLET | SUBLINGUAL | Status: DC | PRN

## 2023-01-15 MED ORDER — MORPHINE SULFATE (PF) 4 MG/ML IV SOLN
4.0000 mg | Freq: Once | INTRAVENOUS | Status: AC
  Administered 2023-01-15: 4 mg via INTRAVENOUS
  Filled 2023-01-15: qty 1

## 2023-01-15 MED ORDER — NAPROXEN 375 MG PO TABS: 375.0000 mg | ORAL_TABLET | Freq: Two times a day (BID) | ORAL | 0 refills | Status: DC

## 2023-01-15 MED ORDER — IOHEXOL 350 MG/ML SOLN
75.0000 mL | Freq: Once | INTRAVENOUS | Status: AC | PRN
  Administered 2023-01-15: 75 mL via INTRAVENOUS

## 2023-01-15 NOTE — ED Provider Notes (Addendum)
[place] [facility]MENT AT [facility] Provider Note   CSN: [id number] Arrival date & time: 01/15/23  [id number]     History  Chief Complaint  Patient presents with   Chest Pain    Tabitha Smith is a 32 y.o. female.  31 year old female presents today for evaluation of chest pain.  Ongoing for the past 2 to 3 days.  She also reports left-sided neck pain.  Denies prior history of CAD, PE, or DVT.  Does have prior history of stroke.  She is unsure of what led to her stroke.  Not currently on anticoagulation.  States she was at 1 point.  She does have history of seizures as well.  States the pain is otherwise constant.  Unsure of other alleviating or aggravating factors.  Has not taken anything prior to arrival.  Denies any significant family history of CAD.  Denies recent long travel, leg pain, leg swelling.  The history is provided by the patient. No language interpreter was used.       Home Medications Prior to Admission medications   Medication Sig Start Date End Date Taking? Authorizing Provider  Erenumab-aooe (AIMOVIG) 70 MG/ML SOAJ Inject 70 mg into the skin every 14 (fourteen) days. 10/11/21   Melvenia Beam, MD  ibuprofen (ADVIL) 800 MG tablet Take 1 tablet (800 mg total) by mouth every 8 (eight) hours as needed for moderate pain or cramping. 05/22/22   Waymon Amato, MD  levETIRAcetam (KEPPRA XR) 500 MG 24 hr tablet TAKE 2 TABLETS(1000 MG) BY MOUTH AT BEDTIME 04/18/22   Melvenia Beam, MD  linaclotide (LINZESS) 290 MCG CAPS capsule Take 290 mcg by mouth daily as needed (constipation).    [provider]  ondansetron (ZOFRAN) 4 MG tablet Take 1 tablet (4 mg total) by mouth every 8 (eight) hours as needed for nausea or vomiting. 07/02/22   Gildardo Pounds, NP  oxyCODONE (OXY IR/ROXICODONE) 5 MG immediate release tablet Take 1 tablet (5 mg total) by mouth every 4 (four) hours as needed for severe pain or breakthrough pain. 05/22/22   Waymon Amato, MD   pantoprazole (PROTONIX) 40 MG tablet TAKE 1 TABLET(40 MG) BY MOUTH TWICE DAILY BEFORE A MEAL 06/16/22   Thornton Park, MD  Ubrogepant (UBRELVY) 100 MG TABS Take 100 mg by mouth every 2 (two) hours as needed. Maximum 200mg  a day. 10/26/21   Melvenia Beam, MD  zolpidem (AMBIEN) 10 MG tablet Take 10 mg by mouth at bedtime as needed for sleep.    [provider]      Allergies    Amoxicillin, Bee venom, and Penicillins    Review of Systems   Review of Systems  Constitutional:  Negative for chills and fever.  Respiratory:  Negative for cough and shortness of breath.   Cardiovascular:  Positive for chest pain. Negative for palpitations and leg swelling.  Gastrointestinal:  Negative for abdominal pain, nausea and vomiting.  All other systems reviewed and are negative.   Physical Exam Updated Vital Signs BP 113/79   Pulse 78   Temp 99.1 F (37.3 C) (Oral)   Resp 19   Wt 79 kg   SpO2 99%   BMI 28.98 kg/m  Physical Exam Vitals and nursing note reviewed.  Constitutional:      General: She is not in acute distress.    Appearance: Normal appearance. She is not ill-appearing.  HENT:     Head: Normocephalic and atraumatic.     Nose: Nose normal.  Eyes:     General: No scleral icterus.    Extraocular Movements: Extraocular movements intact.     Conjunctiva/sclera: Conjunctivae normal.  Cardiovascular:     Rate and Rhythm: Regular rhythm. Tachycardia present.     Pulses: Normal pulses.  Pulmonary:     Effort: Pulmonary effort is normal. No respiratory distress.     Breath sounds: Normal breath sounds. No wheezing or rales.  Abdominal:     General: There is no distension.     Tenderness: There is no abdominal tenderness.  Musculoskeletal:        General: Normal range of motion.     Cervical back: Normal range of motion.     Right lower leg: No edema.     Left lower leg: No edema.  Skin:    General: Skin is warm and dry.  Neurological:     General: No focal  deficit present.     Mental Status: She is alert. Mental status is at baseline.     ED Results / Procedures / Treatments   Labs (all labs ordered are listed, but only abnormal results are displayed) Labs Reviewed  BASIC METABOLIC PANEL  CBC WITH DIFFERENTIAL/PLATELET  MAGNESIUM  I-STAT BETA HCG BLOOD, ED ([facility], [facility], [facility] ONLY)  TROPONIN I (HIGH SENSITIVITY)  TROPONIN I (HIGH SENSITIVITY)    EKG None  Radiology CT Angio Chest PE W and/or Wo Contrast  Result Date: 01/15/2023 CLINICAL DATA:  Chest pain EXAM: CT ANGIOGRAPHY CHEST WITH CONTRAST TECHNIQUE: Multidetector CT imaging of the chest was performed using the standard protocol during bolus administration of intravenous contrast. Multiplanar CT image reconstructions and MIPs were obtained to evaluate the vascular anatomy. RADIATION DOSE REDUCTION: This exam was performed according to the departmental dose-optimization program which includes automated exposure control, adjustment of the mA and/or kV according to patient size and/or use of iterative reconstruction technique. CONTRAST:  75 mL OMNIPAQUE IOHEXOL 350 MG/ML SOLN COMPARISON:  09/02/2020 FINDINGS: Cardiovascular: Satisfactory opacification of the pulmonary arteries to the segmental level. No evidence of pulmonary embolism. Normal heart size. No pericardial effusion. Mediastinum/Nodes: No enlarged mediastinal, hilar, or axillary lymph nodes. Thyroid gland, trachea, and esophagus demonstrate no significant findings. Lungs/Pleura: Lungs are clear. No pleural effusion or pneumothorax. Upper Abdomen: No acute abnormality. Musculoskeletal: No chest wall abnormality. No acute or significant osseous findings. Review of the MIP images confirms the above findings. IMPRESSION: Unremarkable examination with no evidence of PE, aneurysm or dissection. Lungs are clear. Electronically Signed   By: Sammie Bench M.D.   On: 01/15/2023 11:06   DG Chest 2 View  Result Date: 01/15/2023 CLINICAL DATA:   chest pain EXAM: CHEST - 2 VIEW COMPARISON:  09/02/2020 FINDINGS: Cardiac silhouette is unremarkable. No pneumothorax or pleural effusion. The lungs are clear. The visualized skeletal structures are unremarkable. IMPRESSION: No acute cardiopulmonary process. Electronically Signed   By: Sammie Bench M.D.   On: 01/15/2023 09:01    Procedures Procedures    Medications Ordered in ED Medications  nitroGLYCERIN (NITROSTAT) SL tablet 0.4 mg (has no administration in time range)  aspirin tablet 325 mg (325 mg Oral Given 01/15/23 0902)  sodium chloride (PF) 0.9 % injection (  Given by Other 01/15/23 1051)  iohexol (OMNIPAQUE) 350 MG/ML injection 75 mL (75 mLs Intravenous Contrast Given 01/15/23 1055)  ondansetron (ZOFRAN) injection 4 mg (4 mg Intravenous Given 01/15/23 1130)  morphine (PF) 4 MG/ML injection 4 mg (4 mg Intravenous Given 01/15/23 1130)    ED Course/ Medical Decision  Making/ A&P                             Medical Decision Making Amount and/or Complexity of Data Reviewed Labs: ordered. Radiology: ordered.  Risk OTC drugs. Prescription drug management.   Medical Decision Making / ED Course   This patient presents to the ED for concern of chest pain, this involves an extensive number of treatment options, and is a complaint that carries with it a high risk of complications and morbidity.  The differential diagnosis includes ACS, PE, pneumonia, MSK etiology, GERD, dissection  MDM: 32 year old female with past medical history as above presents today for evaluation of chest pain.  Symptoms ongoing for 2 to 3 days.  Appears comfortable on exam.  Stable vital signs.  No acute distress.  Initially tachycardic.  Does have history of stroke.  Will obtain ACS workup as well as evaluate for PE.  CBC is unremarkable.  BMP is unremarkable.  Initial troponin of 3.  Given symptoms going on for about 2 to 3 days do not need to repeat.  Chest x-ray without acute cardiopulmonary process.  EKG  without acute ischemic changes.  Magnesium 2.2.  hCG negative.  CT angio PE study negative for acute process.  Reports improvement in pain following medications.  No concerning cause of her chest pain identified.  Will provide cardiology referral.  Discussed follow-up with PCP.  Patient voices understanding and is in agreement with plan.  Will trial both short course of NSAIDs while she awaits follow-up with cardiology and PCP.  Heart score of 3.  Low risk.  Lab Tests: -I ordered, reviewed, and interpreted labs.   The pertinent results include:   Labs Reviewed  BASIC METABOLIC PANEL  CBC WITH DIFFERENTIAL/PLATELET  MAGNESIUM  I-STAT BETA HCG BLOOD, ED ([facility], [facility], [facility] ONLY)  TROPONIN I (HIGH SENSITIVITY)  TROPONIN I (HIGH SENSITIVITY)      EKG  EKG Interpretation  Date/Time:    Ventricular Rate:    PR Interval:    QRS Duration:   QT Interval:    QTC Calculation:   R Axis:     Text Interpretation:           Imaging Studies ordered: I ordered imaging studies including chest x-ray, CT angio PE study I independently visualized and interpreted imaging. I agree with the radiologist interpretation   Medicines ordered and prescription drug management: Meds ordered this encounter  Medications   aspirin tablet 325 mg   nitroGLYCERIN (NITROSTAT) SL tablet 0.4 mg   sodium chloride (PF) 0.9 % injection    [place], [place]: cabinet override   iohexol (OMNIPAQUE) 350 MG/ML injection 75 mL   ondansetron (ZOFRAN) injection 4 mg   morphine (PF) 4 MG/ML injection 4 mg    -I have reviewed the patients home medicines and have made adjustments as needed   Reevaluation: After the interventions noted above, I reevaluated the patient and found that they have :improved  Co morbidities that complicate the patient evaluation  Past Medical History:  Diagnosis Date   Abnormal uterine bleeding 2023   Adnexal mass    Anxiety    Depression    w/ hx of suidical ideation   History of acute  heart failure 08/2020   in the setting of septic shock and acute CVA after having a root canal   history of CHF (congestive heart failure) ([place]) [id number]   acute systolic and diastolic heart failure in the  setting of septic shock and acute CVA. LOV with cardiologist Dr. Oswaldo Milian on 01/11/21 in Epic (f/u as needed).   Hx of septic shock 08/2020   Hospital admission 09/02/20 to 09/07/20, pt has a root canal about 2 days before hospital admission   IBS (irritable bowel syndrome)    Iron deficiency anemia    hx of IV iron, Pt follows with Dr. Narda Rutherford, Hematology / Oncology, lov 03/29/22 in [place].   Migraine without aura and without status migrainosus, not intractable 10/26/2021   Follows with Dr. Sarina Ill, [place] 04/27/22.   Numbness and tingling of right arm 02/15/2022   w/ confusion, see office visit w/ Debbora Presto, NP, neurology, dated 02/15/22 in [place].   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 Pt follows with neurologist, Dr. Sarina Ill at [facility], [place] (video visit) 04/27/22 in Epic. 03/01/22 EEG - normal awake and asleep EEG.   Sinusitis    Stroke [facility]) 09/02/2020   09/02/2020 MRI of brain (in Epic)  revealed multi-focal left occipitotemporal & left cerebellar infarcts. Patient was also found to be hypercoagulable with and elevated anticardiolipen.      Dispostion: Patient discharged in stable condition.  Return precaution discussed.  Patient voices understanding and is in agreement with plan.  Final Clinical Impression(s) / ED Diagnoses Final diagnoses:  Atypical chest pain    Rx / DC Orders ED Discharge Orders          Ordered    naproxen (NAPROSYN) 375 MG tablet  2 times daily        01/15/23 1200    Ambulatory referral to Cardiology        Comments: If you have not heard from the Cardiology office within the next 72 hours please call [phone].   01/15/23 1201              Evlyn Courier, PA-C 01/15/23 1201    Evlyn Courier, PA-C 01/15/23 1203    Sherwood Gambler, MD 01/19/23 [phone]

## 2023-01-15 NOTE — ED Triage Notes (Signed)
C/o centralized cp radiating into back of neck with left arm tingling/numbness, nausea, fatigue, and sob x2 days.

## 2023-01-15 NOTE — ED Notes (Signed)
Dc instructions and scripts reviewed with pt no questions or concerns at this time. Will follow up with cardiology. Ambulated with steady gait out of ED with friend at side. Decline wheelchair.

## 2023-01-15 NOTE — Discharge Instructions (Signed)
Your workup today was reassuring.  No concerns of your chest pain identified.  Have sent naproxen into the pharmacy for you.  Follow-up with cardiology and your primary care doctor.  I have given you referral to cardiology.  They should call you within the next 2 days to schedule this appointment.  If you do not hear from them please call their office to schedule this appointment.

## 2023-01-16 ENCOUNTER — Telehealth: Payer: Self-pay | Admitting: Licensed Clinical Social Worker

## 2023-01-16 ENCOUNTER — Encounter (HOSPITAL_BASED_OUTPATIENT_CLINIC_OR_DEPARTMENT_OTHER): Payer: Self-pay | Admitting: Family

## 2023-01-16 ENCOUNTER — Ambulatory Visit (INDEPENDENT_AMBULATORY_CARE_PROVIDER_SITE_OTHER): Payer: Self-pay | Admitting: Family

## 2023-01-16 VITALS — BP 120/82 | HR 90 | Ht 65.0 in | Wt 174.0 lb

## 2023-01-16 DIAGNOSIS — Z5989 Other problems related to housing and economic circumstances: Secondary | ICD-10-CM

## 2023-01-16 DIAGNOSIS — Z8673 Personal history of transient ischemic attack (TIA), and cerebral infarction without residual deficits: Secondary | ICD-10-CM

## 2023-01-16 DIAGNOSIS — R0609 Other forms of dyspnea: Secondary | ICD-10-CM

## 2023-01-16 DIAGNOSIS — R079 Chest pain, unspecified: Secondary | ICD-10-CM
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
## 2023-01-16 NOTE — Patient Instructions (Signed)
Medication Instructions:  Your physician recommends that you continue on your current medications as directed. Please refer to the Current Medication list given to you today.  *If you need a refill on your cardiac medications before your next appointment, please call your pharmacy*   Lab Work: Your physician recommends that you return for lab work today- Sed Rate, CRP, and BNP   Follow-Up: At [facility], you and your health needs are our priority.  As part of our continuing mission to provide you with exceptional heart care, we have created designated Provider Care Teams.  These Care Teams include your primary Cardiologist (physician) and Advanced Practice Providers (APPs -  Physician Assistants and Nurse Practitioners) who all work together to provide you with the care you need, when you need it.  We recommend signing up for the patient portal called "MyChart".  Sign up information is provided on this After Visit Summary.  MyChart is used to connect with patients for Virtual Visits (Telemedicine).  Patients are able to view lab/test results, encounter notes, upcoming appointments, etc.  Non-urgent messages can be sent to your provider as well.   To learn more about what you can do with MyChart, go to [email or web address].    Your next appointment:   1 month(s)  Provider:   Oswaldo Milian, MD or Laurann Montana, NP    Other Instructions We have referred you over to social work, Michiel Cowboy will reach out to you!   We have provided you with a gift card today to help with medication expenses!

## 2023-01-16 NOTE — Telephone Encounter (Signed)
Additional phone note opened in error.  Denise Washburn, MSW, LCSW Clinical Social Worker [facility]  [phone]- work cell phone (preferred) [phone]- desk phone  

## 2023-01-16 NOTE — Progress Notes (Signed)
Heart and Vascular Care Navigation  01/16/2023  Tabitha Smith 09-Sep-1991 [id number]  Reason for Referral:  Patient is participating in a Managed Medicaid Plan: No, previously received Medicaid, at this time uninsured  Engaged with patient by telephone for initial visit for Heart and Vascular Care Coordination.                                                                                                   Assessment:                LCSW spoke with pt via telephone at [phone]. Introduced self, role, reason for call. Pt confirmed home address, sees [name], NP at [facility]. She shares that she and her son both had Medicaid, last year they moved to [place] [facility] briefly and then returned to [place]. During that transition they lost coverage, her insurance through her job is too expensive to afford at this time. There was some discussion around potentially pt income being over guidelines, and pt mentioned something about her son's father passing away. I encouraged pt to go to DSS in person to discuss eligibility. Even if she is not eligible her son should be at this time. If she remains ineligible we discussed Patient Assistance through [facility] and [employer] if needed. Pt also is eligible to utilize [employer] which could fill her medications at affordable costs. If she continues to utilize [facility] she should utilize [employer] Pt shares she is making ends meet for rent/utilities. She also is receiving SNAP but needs to recertify, encouraged her to do this as well in person at [place]. Pt agreeable to this plan. No additional questions at this time.                        HRT/VAS Care Coordination     Patients Home Cardiology Office --  [facility]   Outpatient Care Team Social Worker   Social Worker Name: Westley Hummer, [place], [phone]   Living arrangements for the past 2 months Apartment   Lives with: Minor Children   Patient Current Insurance  Coverage Self-Pay   Patient Has Concern With Paying Medical Bills Yes   Patient Concerns With Medical Bills medicaid lapsed, ongoing medical care   Medical Bill Referrals: [facility] Financial Assistance if not eligible for Medicaid   Does Patient Have Prescription Coverage? No   Home Assistive Devices/Equipment None       Social History:                                                                             SDOH Screenings   Food Insecurity: No Food Insecurity (01/16/2023)  Housing: Low Risk  (01/16/2023)  Transportation Needs: No Transportation Needs (01/16/2023)  Utilities: Not At Risk (01/16/2023)  Financial Resource  Strain: Medium Risk (01/16/2023)  Tobacco Use: High Risk (01/16/2023)    SDOH Interventions: Financial Resources:  Financial Strain Interventions: Other (Comment) (Medicaid expansion, [place], community resources offered.) DSS for financial assistance and Occupational hygienist for [place] Insecurity:  Food Insecurity Interventions: Intervention Not Indicated (recieves SNAP, and working on Biochemist, clinical)  Housing Insecurity:  Housing Interventions: Intervention Not Indicated  Transportation:   Transportation Interventions: Intervention Not Indicated     Other Care Navigation Interventions:     Provided Pharmacy assistance resources  Pt needs to apply for Medicaid, at this time there are not assistance programs to cover medications, could utilize   Follow-up plan:   LCSW sent pt my card, GoodRx card, [facility] pharmacy information as many of her medications are more affordable through that pharmacy program. Encouraged her to let me know once she has heard back from caseworker about Medicaid coverage. I will f/u in the next few weeks if I do not hear directly from pt.

## 2023-01-17 LAB — BRAIN NATRIURETIC PEPTIDE: BNP: <2.5 pg/mL (ref 0.0–100.0)

## 2023-01-17 LAB — SEDIMENTATION RATE: Sed Rate: 3 mm/hr (ref 0–32)

## 2023-01-17 LAB — C-REACTIVE PROTEIN: CRP: <1 mg/L (ref 0–10)

## 2023-01-17 NOTE — Progress Notes (Signed)
Msg left for patient to return call so we can give her lab results . TDS 01/17/2023

## 2023-01-17 NOTE — Progress Notes (Signed)
Msg left for patient requesting call back TDS

## 2023-01-23 ENCOUNTER — Telehealth: Payer: Self-pay | Admitting: Licensed Clinical Social Worker

## 2023-01-23 NOTE — Telephone Encounter (Signed)
H&V Care Navigation CSW Progress Note  Clinical Social Worker contacted patient by phone to f/u on benefits questions. No answer today at [phone], previously had discussed pt going to see DSS caseworkers regarding Medicaid benefits and SNAP. Pt shared she had some challenges with benefits transitioning between moves. I left voicemail with my contact information and an update regarding DSS representatives available for walk in appointments at our [facility]. I will re-attempt as able to update pt.   Patient is participating in a Managed Medicaid Plan:  No, self pay, previously [facility].   SDOH Screenings   Food Insecurity: No Food Insecurity (01/16/2023)  Housing: Low Risk  (01/16/2023)  Transportation Needs: No Transportation Needs (01/16/2023)  Utilities: Not At Risk (01/16/2023)  Financial Resource Strain: Medium Risk (01/16/2023)  Tobacco Use: High Risk (01/16/2023)   Westley Hummer, MSW, [place]  [phone]- work cell phone (preferred) [phone]- desk phone

## 2023-01-25 ENCOUNTER — Telehealth: Payer: Self-pay | Admitting: Licensed Clinical Social Worker

## 2023-01-25 NOTE — Telephone Encounter (Signed)
H&V Care Navigation CSW Progress Note  Clinical Social Worker contacted patient by phone to f/u on benefits questions. No answer again today at [phone], previously had discussed pt going to see DSS caseworkers regarding Medicaid benefits and SNAP. I left additional voicemail with my contact information, I will re-attempt as able to update pt.    Patient is participating in a Managed Medicaid Plan:  No, self pay, previously [facility].   SDOH Screenings   Food Insecurity: No Food Insecurity (01/16/2023)  Housing: Low Risk  (01/16/2023)  Transportation Needs: No Transportation Needs (01/16/2023)  Utilities: Not At Risk (01/16/2023)  Financial Resource Strain: Medium Risk (01/16/2023)  Tobacco Use: High Risk (01/16/2023)   Westley Hummer, MSW, [place]  [phone]- work cell phone (preferred) [phone]- desk phone

## 2023-01-27 ENCOUNTER — Encounter (HOSPITAL_COMMUNITY): Payer: Self-pay | Admitting: Emergency Medicine

## 2023-01-27 ENCOUNTER — Emergency Department (HOSPITAL_COMMUNITY)
Admission: EM | Admit: 2023-01-27 | Discharge: 2023-01-27 | Disposition: A | Discharge status: Home / Self Care | Payer: Self-pay | Attending: Student | Admitting: Student

## 2023-01-27 ENCOUNTER — Emergency Department (HOSPITAL_COMMUNITY): Payer: Self-pay

## 2023-01-27 ENCOUNTER — Other Ambulatory Visit: Payer: Self-pay

## 2023-01-27 DIAGNOSIS — G40909 Epilepsy, unspecified, not intractable, without status epilepticus: Secondary | ICD-10-CM | POA: Insufficient documentation

## 2023-01-27 DIAGNOSIS — I509 Heart failure, unspecified: Secondary | ICD-10-CM | POA: Insufficient documentation

## 2023-01-27 DIAGNOSIS — F17210 Nicotine dependence, cigarettes, uncomplicated: Secondary | ICD-10-CM | POA: Insufficient documentation

## 2023-01-27 DIAGNOSIS — R519 Headache, unspecified: Secondary | ICD-10-CM | POA: Insufficient documentation

## 2023-01-27 DIAGNOSIS — R55 Syncope and collapse: Secondary | ICD-10-CM | POA: Insufficient documentation

## 2023-01-27 LAB — URINALYSIS, ROUTINE W REFLEX MICROSCOPIC
Bilirubin Urine: NEGATIVE
Glucose, UA: NEGATIVE mg/dL
Hgb urine dipstick: NEGATIVE
Ketones, ur: NEGATIVE mg/dL
Leukocytes,Ua: NEGATIVE
Nitrite: NEGATIVE
Protein, ur: NEGATIVE mg/dL
Specific Gravity, Urine: 1.003 — ABNORMAL LOW (ref 1.005–1.030)
pH: 7 (ref 5.0–8.0)

## 2023-01-27 LAB — CBC WITH DIFFERENTIAL/PLATELET
Abs Immature Granulocytes: 0.02 10*3/uL (ref 0.00–0.07)
Basophils Absolute: 0 10*3/uL (ref 0.0–0.1)
Basophils Relative: 0 %
Eosinophils Absolute: 0.1 10*3/uL (ref 0.0–0.5)
Eosinophils Relative: 1 %
HCT: 41.1 % (ref 36.0–46.0)
Hemoglobin: 13.6 g/dL (ref 12.0–15.0)
Immature Granulocytes: 0 %
Lymphocytes Relative: 37 %
Lymphs Abs: 2.4 10*3/uL (ref 0.7–4.0)
MCH: 30.5 pg (ref 26.0–34.0)
MCHC: 33.1 g/dL (ref 30.0–36.0)
MCV: 92.2 fL (ref 80.0–100.0)
Monocytes Absolute: 0.4 10*3/uL (ref 0.1–1.0)
Monocytes Relative: 6 %
Neutro Abs: 3.6 10*3/uL (ref 1.7–7.7)
Neutrophils Relative %: 56 %
Platelets: 222 10*3/uL (ref 150–400)
RBC: 4.46 MIL/uL (ref 3.87–5.11)
RDW: 12.3 % (ref 11.5–15.5)
WBC: 6.5 10*3/uL (ref 4.0–10.5)
nRBC: 0 % (ref 0.0–0.2)

## 2023-01-27 LAB — RAPID URINE DRUG SCREEN, HOSP PERFORMED
Amphetamines: NOT DETECTED
Barbiturates: NOT DETECTED
Benzodiazepines: NOT DETECTED
Cocaine: NOT DETECTED
Opiates: NOT DETECTED
Tetrahydrocannabinol: NOT DETECTED

## 2023-01-27 LAB — COMPREHENSIVE METABOLIC PANEL
ALT: 24 U/L (ref 0–44)
AST: 20 U/L (ref 15–41)
Albumin: 4.4 g/dL (ref 3.5–5.0)
Alkaline Phosphatase: 42 U/L (ref 38–126)
Anion gap: 8 (ref 5–15)
BUN: 6 mg/dL (ref 6–20)
CO2: 26 mmol/L (ref 22–32)
Calcium: 8.9 mg/dL (ref 8.9–10.3)
Chloride: 103 mmol/L (ref 98–111)
Creatinine, Ser: 0.9 mg/dL (ref 0.44–1.00)
GFR, Estimated: >60 mL/min (ref >60)
Glucose, Bld: 78 mg/dL (ref 70–99)
Potassium: 3.3 mmol/L — ABNORMAL LOW (ref 3.5–5.1)
Sodium: 137 mmol/L (ref 135–145)
Total Bilirubin: 0.3 mg/dL (ref 0.3–1.2)
Total Protein: 7.3 g/dL (ref 6.5–8.1)

## 2023-01-27 LAB — CBG MONITORING, ED: Glucose-Capillary: 76 mg/dL (ref 70–99)

## 2023-01-27 MED ORDER — KETOROLAC TROMETHAMINE 15 MG/ML IJ SOLN
15.0000 mg | Freq: Once | INTRAMUSCULAR | Status: AC
  Administered 2023-01-27: 15 mg via INTRAVENOUS
  Filled 2023-01-27: qty 1

## 2023-01-27 MED ORDER — LACTATED RINGERS IV BOLUS
1000.0000 mL | Freq: Once | INTRAVENOUS | Status: AC
  Administered 2023-01-27: 1000 mL via INTRAVENOUS

## 2023-01-27 MED ORDER — PROCHLORPERAZINE EDISYLATE 10 MG/2ML IJ SOLN
10.0000 mg | Freq: Once | INTRAMUSCULAR | Status: AC
  Administered 2023-01-27: 10 mg via INTRAVENOUS
  Filled 2023-01-27: qty 2

## 2023-01-27 MED ORDER — DIPHENHYDRAMINE HCL 50 MG/ML IJ SOLN
25.0000 mg | Freq: Once | INTRAMUSCULAR | Status: AC
  Administered 2023-01-27: 25 mg via INTRAVENOUS
  Filled 2023-01-27: qty 1

## 2023-01-27 NOTE — ED Triage Notes (Signed)
Patient presents due to a seizure today that lased about 1 min. During the seizure, she was lowered to the floor by co-workers. She had been seizure free for 2-3 weeks. Family reported she had complained of a headache the last 3 days. The headache is worse now.   Hx: seizure   EMS vitals: 89 CBG 148/82 BP 90 HR 97% SPO2 on room air

## 2023-01-27 NOTE — ED Notes (Signed)
Patient PO challenged. She was able to tolerated a sandwich and water well.

## 2023-01-27 NOTE — ED Provider Notes (Signed)
[facility] Provider Note  CSN: [id number] Arrival date & time: 01/27/23 1544  Chief Complaint(s) Seizures  HPI Kolbi Laning is a 32 y.o. 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  Patient states that she has had a headache for the last 3 days and was at work today, took an Excedrin for worsening head pain, and woke up in an ambulance.  I obtained additional history from the patient's manager Erlene Quan who states that the patient had rapid fluttering eye movements, blinking and a syncopal episode into his arms.  Patient then lowered to the floor and has returned to normal mental status baseline.  Denies chest pain, shortness of breath, vomiting, abdominal pain or other systemic symptoms.  Denies numbness, tingling, weakness or other neurologic complaints.   Past Medical History Past Medical History:  Diagnosis Date   Abnormal uterine bleeding 2023   Adnexal mass    Anxiety    Depression    w/ hx of suidical ideation   History of acute heart failure 08/2020   in the setting of septic shock and acute CVA after having a root canal   history of CHF (congestive heart failure) ([place]) [id number]   acute systolic and diastolic heart failure in the setting of septic shock and acute CVA. LOV with cardiologist Dr. Oswaldo Milian on 01/11/21 in Epic (f/u as needed).   Hx of septic shock 08/2020   Hospital admission 09/02/20 to 09/07/20, pt has a root canal about 2 days before hospital admission   IBS (irritable bowel syndrome)    Iron deficiency anemia    hx of IV iron, Pt follows with Dr. Narda Rutherford, Hematology / Oncology, lov 03/29/22 in [place].   Migraine without aura and without status migrainosus, not intractable 10/26/2021   Follows with Dr. Sarina Ill, [place] 04/27/22.   Numbness and tingling of  right arm 02/15/2022   w/ confusion, see office visit w/ Debbora Presto, NP, neurology, dated 02/15/22 in [place].   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 Pt follows with neurologist, Dr. Sarina Ill at [facility], [place] (video visit) 04/27/22 in Epic. 03/01/22 EEG - normal awake and asleep EEG.   Sinusitis    Stroke [facility]) 09/02/2020   09/02/2020 MRI of brain (in Epic)  revealed multi-focal left occipitotemporal & left cerebellar infarcts. Patient was also found to be hypercoagulable with and elevated anticardiolipen.   Patient Active Problem List   Diagnosis Date Noted   Migraine without aura and without status migrainosus, not intractable 10/26/2021   Witnessed seizure-like activity ([place]) 10/26/2021   Iron deficiency anemia 09/27/2021   Fatigue 06/16/2021   Adnexal mass 05/24/2021   Abnormal uterine bleeding 05/24/2021   History of stroke 05/24/2021   Secondary cardiomyopathy ([place])    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   Sepsis ([place]) 09/03/2020   Septic shock ([place]) 09/02/2020   Depression 09/02/2020   History of suicidal ideation 09/02/2020   Aspiration pneumonia of left lower lobe ([place])    Demand ischemia of myocardium    Chronic migraine without aura, with intractable migraine, so stated, with status migrainosus 08/06/2018   Home Medication(s) Prior to Admission medications  Medication Sig Start Date End Date Taking? Authorizing Provider  levETIRAcetam (KEPPRA XR) 500 MG 24 hr tablet TAKE 2 TABLETS(1000 MG) BY MOUTH AT BEDTIME 04/18/22   Melvenia Beam, MD  naproxen (NAPROSYN) 375 MG tablet Take 1 tablet (375 mg total) by mouth 2 (two) times daily. 01/15/23   Deatra Canter, Amjad, PA-C  pantoprazole (PROTONIX) 40 MG tablet TAKE 1 TABLET(40 MG) BY MOUTH  TWICE DAILY BEFORE A MEAL 06/16/22   Thornton Park, MD  zolpidem (AMBIEN) 10 MG tablet Take 10 mg by mouth at bedtime as needed for sleep.    [provider]                                                                                                                                    Past Surgical History Past Surgical History:  Procedure Laterality Date   BUBBLE STUDY  09/06/2020   Procedure: BUBBLE STUDY;  Surgeon: Fay Records, MD;  Location: [facility];  Service: Cardiovascular;;   COLONOSCOPY WITH ESOPHAGOGASTRODUODENOSCOPY (EGD)  01/10/2022   DILATION AND CURETTAGE OF UTERUS N/A 06/14/2021   Procedure: DILATATION AND CURETTAGE;  Surgeon: Lafonda Mosses, MD;  [facility];  Service: Gynecology;  Laterality: N/A;   DILITATION & CURRETTAGE/HYSTROSCOPY WITH NOVASURE ABLATION N/A 05/22/2022   Procedure: DILATATION & CURETTAGE/HYSTEROSCOPY WITH NOVASURE ABLATION;  Surgeon: Waymon Amato, MD;  Location: [facility];  Service: Gynecology;  Laterality: N/A;   INTRAUTERINE DEVICE (IUD) INSERTION N/A 06/14/2021   Procedure: [place] (IUD) INSERTION;  Surgeon: Lafonda Mosses, MD;  Location[facility];  Service: Gynecology;  Laterality: N/A;   IUD REMOVAL N/A 05/22/2022   Procedure: INTRAUTERINE DEVICE (IUD) REMOVAL;  Surgeon: Waymon Amato, MD;  Location: [facility];  Service: Gynecology;  Laterality: N/A;   stitches to abdomen      for a cut    TEE WITHOUT CARDIOVERSION N/A 09/06/2020   Procedure: TRANSESOPHAGEAL ECHOCARDIOGRAM (TEE);  Surgeon: Fay Records, MD;  Location: [facility];  Service: Cardiovascular;  Laterality: N/A;   Family History Family History  Problem Relation Age of Onset   Diabetes Mother    Seizures Mother    Cancer Mother        Colon   Hypertension Mother    Stroke Mother    Migraines Neg Hx    Headache Neg Hx    Breast cancer Neg Hx    Ovarian cancer Neg Hx    Endometrial cancer Neg Hx     Pancreatic cancer Neg Hx    Prostate cancer Neg Hx     Social History Social History   Tobacco Use   Smoking status: Every Day    Packs/day: 0.25    Years: 12.00    Additional pack years: 0.00    Total pack years: 3.00    Types: Cigarettes   Smokeless tobacco: Never  Vaping Use   Vaping Use: Never  used  Substance Use Topics   Alcohol use: Yes    Comment: occasionally   Drug use: Never   Allergies Amoxicillin, Bee venom, and Penicillins  Review of Systems Review of Systems  Neurological:  Positive for seizures, syncope and headaches.    Physical Exam Vital Signs  I have reviewed the triage vital signs BP 124/84 (BP Location: Right Arm)   Pulse (!) 58   Temp 97.6 F (36.4 C) (Oral)   Resp 14   SpO2 100%   Physical Exam Vitals and nursing note reviewed.  Constitutional:      General: She is not in acute distress.    Appearance: She is well-developed.  HENT:     Head: Normocephalic and atraumatic.  Eyes:     Conjunctiva/sclera: Conjunctivae normal.  Cardiovascular:     Rate and Rhythm: Normal rate and regular rhythm.     Heart sounds: No murmur heard. Pulmonary:     Effort: Pulmonary effort is normal. No respiratory distress.     Breath sounds: Normal breath sounds.  Abdominal:     Palpations: Abdomen is soft.     Tenderness: There is no abdominal tenderness.  Musculoskeletal:        General: No swelling.     Cervical back: Neck supple.  Skin:    General: Skin is warm and dry.     Capillary Refill: Capillary refill takes less than 2 seconds.  Neurological:     Mental Status: She is alert.  Psychiatric:        Mood and Affect: Mood normal.     ED Results and Treatments Labs (all labs ordered are listed, but only abnormal results are displayed) Labs Reviewed  COMPREHENSIVE METABOLIC PANEL  CBC WITH DIFFERENTIAL/PLATELET  URINALYSIS, ROUTINE W REFLEX MICROSCOPIC  RAPID URINE DRUG SCREEN, HOSP PERFORMED  CBG MONITORING, ED  I-STAT BETA HCG  BLOOD, ED ([facility], [facility], [facility] ONLY)                                                                                                                          Radiology No results found.  Pertinent labs & imaging results that were available during my care of the patient were reviewed by me and considered in my medical decision making (see MDM for details).  Medications Ordered in ED Medications  prochlorperazine (COMPAZINE) injection 10 mg (has no administration in time range)  diphenhydrAMINE (BENADRYL) injection 25 mg (has no administration in time range)  lactated ringers bolus 1,000 mL (has no administration in time range)  Procedures Procedures  (including critical care time)  Medical Decision Making / ED Course   This patient presents to the ED for concern of seizure for syncope, this involves an extensive number of treatment options, and is a complaint that carries with it a high risk of complications and morbidity.  The differential diagnosis includes epileptic seizures, nonepileptic seizures, convulsive syncope, migraine headache, electrolyte abnormality, intracranial pathology, viral illness  MDM: Patient seen emergency room for evaluation of a seizure-like episode.  Physical exam is unremarkable with no focal motor or sensory deficits.  No cranial nerve deficits.  Laboratory evaluation unremarkable outside of mild hypokalemia to 3.3.  CT head unremarkable.  UDS negative and obtained in the setting of abnormal seizure versus syncope behavior.  Headache cocktail was given to the patient for her persistent headache leading to significant improvement of her symptoms.  Suspect likely nonepileptic seizure versus convulsive syncopal episode today given the fact that she had rapid blinking during this episode and no postictal state.  On reevaluation, patient continues  to feel symptomatically improved and at this time she does not meet inpatient criteria for admission.  She is safe for discharge with outpatient follow-up.  Patient instructed to follow-up with her neurologist at [facility] neurology and her primary care physician.  Return precautions given which she voiced understanding.   Additional history obtained: -Additional history obtained from girlfriend -External records from outside source obtained and reviewed including: Chart review including previous notes, labs, imaging, consultation notes   Lab Tests: -I ordered, reviewed, and interpreted labs.   The pertinent results include:   Labs Reviewed  COMPREHENSIVE METABOLIC PANEL  CBC WITH DIFFERENTIAL/PLATELET  URINALYSIS, ROUTINE W REFLEX MICROSCOPIC  RAPID URINE DRUG SCREEN, HOSP PERFORMED  CBG MONITORING, ED  I-STAT BETA HCG BLOOD, ED ([facility], [facility], [facility] ONLY)       Imaging Studies ordered: I ordered imaging studies including CT head I independently visualized and interpreted imaging. I agree with the radiologist interpretation   Medicines ordered and prescription drug management: Meds ordered this encounter  Medications   prochlorperazine (COMPAZINE) injection 10 mg   diphenhydrAMINE (BENADRYL) injection 25 mg   lactated ringers bolus 1,000 mL    -I have reviewed the patients home medicines and have made adjustments as needed  Critical interventions none   Cardiac Monitoring: The patient was maintained on a cardiac monitor.  I personally viewed and interpreted the cardiac monitored which showed an underlying rhythm of: NSR  Social Determinants of Health:  Factors impacting patients care include: none   Reevaluation: After the interventions noted above, I reevaluated the patient and found that they have :improved  Co morbidities that complicate the patient evaluation  Past Medical History:  Diagnosis Date   Abnormal uterine bleeding 2023   Adnexal mass    Anxiety     Depression    w/ hx of suidical ideation   History of acute heart failure 08/2020   in the setting of septic shock and acute CVA after having a root canal   history of CHF (congestive heart failure) ([place]) 99991111   acute systolic and diastolic heart failure in the setting of septic shock and acute CVA. LOV with cardiologist Dr. Oswaldo Milian on 01/11/21 in Epic (f/u as needed).   Hx of septic shock 08/2020   Hospital admission 09/02/20 to 09/07/20, pt has a root canal about 2 days before hospital admission   IBS (irritable bowel syndrome)    Iron deficiency anemia    hx of IV iron,  Pt follows with Dr. Narda Rutherford, Hematology / Oncology, lov 03/29/22 in [place].   Migraine without aura and without status migrainosus, not intractable 10/26/2021   Follows with Dr. Sarina Ill, [place] 04/27/22.   Numbness and tingling of right arm 02/15/2022   w/ confusion, see office visit w/ Debbora Presto, NP, neurology, dated 02/15/22 in [place].   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 Pt follows with neurologist, Dr. Sarina Ill at [facility] [place] (video visit) 04/27/22 in Epic. 03/01/22 EEG - normal awake and asleep EEG.   Sinusitis    Stroke [facility]) 09/02/2020   09/02/2020 MRI of brain (in Epic)  revealed multi-focal left occipitotemporal & left cerebellar infarcts. Patient was also found to be hypercoagulable with and elevated anticardiolipen.      Dispostion: I considered admission for this patient, but at this time she does not meet inpatient criteria for admission she is safe for discharge with outpatient follow-up     Final Clinical Impression(s) / ED Diagnoses Final diagnoses:  None     @PCDICTATION @    Jancie Kercher, Debe Coder, MD 01/28/23 0121

## 2023-02-05 ENCOUNTER — Telehealth: Payer: Self-pay | Admitting: Licensed Clinical Social Worker

## 2023-02-05 NOTE — Telephone Encounter (Signed)
H&V Care Navigation CSW Progress Note  Clinical Social Worker contacted patient by phone to f/u on benefits questions. No answer again today at [phone]. I left additional voicemail with my contact information, I have sent her information about our DSS partnership at the [facility] and their hours. I remain available if needed.  Patient is participating in a Managed Medicaid Plan:  No, self pay, previously [facility].   SDOH Screenings   Food Insecurity: No Food Insecurity (01/16/2023)  Housing: Low Risk  (01/16/2023)  Transportation Needs: No Transportation Needs (01/16/2023)  Utilities: Not At Risk (01/16/2023)  Financial Resource Strain: Medium Risk (01/16/2023)  Tobacco Use: High Risk (01/27/2023)   Octavio Graves, MSW, LCSW Clinical Social Worker II [facility] Heart/Vascular Care Navigation  [phone]- work cell phone (preferred) [phone]- desk phone

## 2023-02-27 ENCOUNTER — Ambulatory Visit (HOSPITAL_BASED_OUTPATIENT_CLINIC_OR_DEPARTMENT_OTHER): Payer: Self-pay | Admitting: Family

## 2023-05-26 ENCOUNTER — Other Ambulatory Visit (HOSPITAL_BASED_OUTPATIENT_CLINIC_OR_DEPARTMENT_OTHER): Payer: Self-pay

## 2023-05-31 ENCOUNTER — Other Ambulatory Visit: Payer: Self-pay

## 2023-05-31 ENCOUNTER — Emergency Department (HOSPITAL_COMMUNITY)
Admission: EM | Admit: 2023-05-31 | Discharge: 2023-06-01 | Disposition: A | Discharge status: Home / Self Care | Payer: Self-pay | Attending: Emergency Medicine | Admitting: Emergency Medicine

## 2023-05-31 ENCOUNTER — Encounter (HOSPITAL_COMMUNITY): Payer: Self-pay

## 2023-05-31 DIAGNOSIS — I509 Heart failure, unspecified: Secondary | ICD-10-CM | POA: Insufficient documentation

## 2023-05-31 DIAGNOSIS — R519 Headache, unspecified: Secondary | ICD-10-CM | POA: Insufficient documentation

## 2023-05-31 DIAGNOSIS — R569 Unspecified convulsions: Secondary | ICD-10-CM | POA: Insufficient documentation

## 2023-05-31 NOTE — ED Triage Notes (Signed)
Arrives with significant other.   Reported that she has had ongoing headaches despite taking medications.   Partner reports that after the pt got in bed she began shaking. Once rolled over eyes were flickering. She said there was response to sternal rub and episode lasted less than one minute.   Noncompliant on Keppra. Has not had financial means to continue since May/June-ish.

## 2023-06-01 MED ORDER — KETOROLAC TROMETHAMINE 15 MG/ML IJ SOLN
15.0000 mg | Freq: Once | INTRAMUSCULAR | Status: AC
  Administered 2023-06-01: 15 mg via INTRAVENOUS
  Filled 2023-06-01: qty 1

## 2023-06-01 MED ORDER — PROCHLORPERAZINE EDISYLATE 10 MG/2ML IJ SOLN
10.0000 mg | Freq: Once | INTRAMUSCULAR | Status: AC
  Administered 2023-06-01: 10 mg via INTRAVENOUS
  Filled 2023-06-01: qty 2

## 2023-06-01 MED ORDER — LEVETIRACETAM IN NACL 1500 MG/100ML IV SOLN
1500.0000 mg | Freq: Once | INTRAVENOUS | Status: AC
  Administered 2023-06-01: 1500 mg via INTRAVENOUS
  Filled 2023-06-01: qty 100

## 2023-06-01 NOTE — ED Provider Notes (Signed)
[place] EMERGENCY DEPARTMENT AT [facility] Provider Note   CSN: [id number] Arrival date & time: 05/31/23  2226     History  Chief Complaint  Patient presents with   Seizures    Tabitha Smith is a 32 y.o. female.  The history is provided by the patient and a significant other.  Patient extensive history including previous history of CHF, history of depression, previous history of seizures previous history of stroke presents with headache and seizures.  Patient reports previous history of migraines which then tend to lead into seizures.  Per her significant other, patient came home from work around 4 PM and began having body shaking.  This occurred intermittently over the next several hours.  She reports patient will be awake and alert between the episodes.  She also reports migraine that is similar to previous episodes.  Significant other reports that patient typically will have these type of episodes with migraines and seizures.  Patient had otherwise been at her baseline. She does report nonadherence to medications due to lack of insurance During 1 to seizure episode she did fall into the door but denies any traumatic injury    Past Medical History:  Diagnosis Date   Abnormal uterine bleeding 2023   Adnexal mass    Anxiety    Depression    w/ hx of suidical ideation   History of acute heart failure 08/2020   in the setting of septic shock and acute CVA after having a root canal   history of CHF (congestive heart failure) (HCC) 08/2020   acute systolic and diastolic heart failure in the setting of septic shock and acute CVA. LOV with cardiologist Dr. Epifanio Lesches on 01/11/21 in Epic (f/u as needed).   Hx of septic shock 08/2020   Hospital admission 09/02/20 to 09/07/20, pt has a root canal about 2 days before hospital admission   IBS (irritable bowel syndrome)    Iron deficiency anemia    hx of IV iron, Pt follows with Dr. Jeanie Sewer, Hematology / Oncology,  lov 03/29/22 in [place].   Migraine without aura and without status migrainosus, not intractable 10/26/2021   Follows with Dr. Naomie Dean, LOV 04/27/22.   Numbness and tingling of right arm 02/15/2022   w/ confusion, see office visit w/ Shawnie Dapper, NP, neurology, dated 02/15/22 in [place].   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 Pt follows with neurologist, Dr. Naomie Dean at [facility], LOV (video visit) 04/27/22 in Epic. 03/01/22 EEG - normal awake and asleep EEG.   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 Patient was also found to be hypercoagulable with and elevated anticardiolipen.    Home Medications Prior to Admission medications   Medication Sig Start Date End Date Taking? Authorizing Provider  levETIRAcetam (KEPPRA XR) 500 MG 24 hr tablet TAKE 2 TABLETS(1000 MG) BY MOUTH AT BEDTIME 04/18/22   Anson Fret, MD  zolpidem (AMBIEN) 10 MG tablet Take 10 mg by mouth at bedtime as needed for sleep.    [provider]      Allergies    Amoxicillin, Bee venom, and Penicillins    Review of Systems   Review of Systems  Constitutional:  Negative for fever.  Neurological:  Positive for seizures and headaches.    Physical Exam Updated Vital Signs BP 122/73 (BP Location: Left Arm)   Pulse 72   Temp 98.8 F (37.1 C) (Oral)   Resp 18   Ht 1.651 m (5\' 5" )   Wt 81.6 kg   SpO2 97%   BMI 29.95 kg/m  Physical Exam CONSTITUTIONAL: Sleeping but easily arousable in no acute distress HEAD: Normocephalic/atraumatic, no visible trauma EYES: EOMI, no nystagmus ENMT: Mucous membranes moist NECK: supple no meningeal signs CV: S1/S2 noted, no  murmurs/rubs/gallops noted LUNGS: Lungs are clear to auscultation bilaterally, no apparent distress ABDOMEN: soft, nontender NEURO: Pt is sleeping but easily arousable and answers all questions appropriately.  No facial droop.  No arm or leg drift EXTREMITIES: pulses normal/equal, full ROM, no deformities SKIN: warm, color normal   ED Results / Procedures / Treatments   Labs (all labs ordered are listed, but only abnormal results are displayed) Labs Reviewed - No data to display  EKG EKG Interpretation Date/Time:  Friday June 01 2023 01:59:31 EDT Ventricular Rate:  71 PR Interval:  168 QRS Duration:  86 QT Interval:  410 QTC Calculation: 446 R Axis:   -22  Text Interpretation: Sinus rhythm Borderline left axis deviation Low voltage, precordial leads Confirmed by Zadie Rhine ([id number]) on 06/01/2023 2:04:07 AM  Radiology No results found.  Procedures Procedures    Medications Ordered in ED Medications  prochlorperazine (COMPAZINE) injection 10 mg (10 mg Intravenous Given 06/01/23 0147)  ketorolac (TORADOL) 15 MG/ML injection 15 mg (15 mg Intravenous Given 06/01/23 0154)  levETIRAcetam (KEPPRA) IVPB 1500 mg/ 100 mL premix (0 mg Intravenous Stopped 06/01/23 0239)    ED Course/ Medical Decision Making/ A&P Clinical Course as of 06/01/23 4782  Fri Jun 01, 2023  0200 Patient with extensive history though has been unable to afford her medications presents with migraine and seizures.  Patient has a long history of this in the past.  She declines extensive workup.  She appears to be back to her baseline.  Will treat her headache, will start Keppra and reassess.  Have also sent information to transition of care team [DW]  0310 Pt monitored for several hrs and feels improved Reports HA resolved Plan for d/c home  [DW]  0310 Advised patient she can not drive until further evaluation by neurology  [DW]    Clinical Course User Index [DW] Zadie Rhine, MD                                  Medical Decision Making Amount and/or Complexity of Data Reviewed ECG/medicine tests: ordered.  Risk Prescription drug management.   This patient presents to the ED for concern of headache and seizures, this involves an extensive number of treatment options, and is a complaint that carries with it a high risk of complications and morbidity.  The differential diagnosis includes but is not limited to subarachnoid hemorrhage, intracranial hemorrhage, meningitis, encephalitis, CVST, temporal arteritis, idiopathic intracranial hypertension, migraine, status epilepticus, psychogenic nonepileptic seizures    Comorbidities that complicate the patient evaluation: Patient's presentation is complicated by their history of seizures, CVA  Social Determinants of Health: Patient's lack of insurance, lack of prescription access, and impaired access to primary care  increases the complexity of managing their presentation  Additional history obtained: Additional history obtained from significant other Records reviewed  neurology notes reviewed  Medicines ordered and prescription drug management: I ordered medication including Compazine for headache Reevaluation of the patient after these medicines showed that the patient    improved  Test Considered: Patient reports multiple episodes previously with migraine leading to seizures.  Patient is now back to baseline but requesting meds for headache.  Patient would prefer avoiding labs and imaging due to lack of insurance  Reevaluation: After the interventions noted above, I reevaluated the patient and found that they have :improved  Complexity of problems addressed: Patient's presentation is most consistent with  acute presentation with potential threat to life or bodily function  Disposition: After consideration of the diagnostic results and the patient's response to treatment,  I feel that the patent would benefit from discharge   .            Final Clinical Impression(s) / ED Diagnoses Final diagnoses:  Seizure-like activity [facility])    Rx / DC Orders ED Discharge Orders     None         Zadie Rhine, MD 06/01/23 [phone]

## 2023-06-01 NOTE — Discharge Instructions (Signed)
Please be aware you may have another seizure ° °Do not drive until seen by your physician for your condition ° °Do not climb ladders/roofs/trees as a seizure can occur at that height and cause serious harm ° °Do not bathe/swim alone as a seizure can occur and cause serious harm ° °Please followup with your physician or neurologist for further testing and possible treatment ° ° °

## 2024-01-14 ENCOUNTER — Telehealth (HOSPITAL_COMMUNITY): Payer: Self-pay | Admitting: Medical

## 2024-01-14 ENCOUNTER — Telehealth: Payer: Self-pay | Admitting: Surgery

## 2024-01-14 ENCOUNTER — Emergency Department (HOSPITAL_COMMUNITY)

## 2024-01-14 ENCOUNTER — Other Ambulatory Visit: Payer: Self-pay

## 2024-01-14 ENCOUNTER — Emergency Department (HOSPITAL_COMMUNITY)
Admission: EM | Admit: 2024-01-14 | Discharge: 2024-01-14 | Disposition: A | Discharge status: Home / Self Care | Attending: Emergency Medicine | Admitting: Emergency Medicine

## 2024-01-14 DIAGNOSIS — R5383 Other fatigue: Secondary | ICD-10-CM | POA: Insufficient documentation

## 2024-01-14 DIAGNOSIS — R6883 Chills (without fever): Secondary | ICD-10-CM | POA: Insufficient documentation

## 2024-01-14 DIAGNOSIS — R569 Unspecified convulsions: Secondary | ICD-10-CM | POA: Insufficient documentation

## 2024-01-14 DIAGNOSIS — Z8673 Personal history of transient ischemic attack (TIA), and cerebral infarction without residual deficits: Secondary | ICD-10-CM | POA: Diagnosis not present

## 2024-01-14 DIAGNOSIS — R110 Nausea: Secondary | ICD-10-CM | POA: Diagnosis not present

## 2024-01-14 DIAGNOSIS — I509 Heart failure, unspecified: Secondary | ICD-10-CM | POA: Diagnosis not present

## 2024-01-14 LAB — COMPREHENSIVE METABOLIC PANEL
ALT: 17 U/L (ref 0–44)
AST: 17 U/L (ref 15–41)
Albumin: 3.6 g/dL (ref 3.5–5.0)
Alkaline Phosphatase: 46 U/L (ref 38–126)
Anion gap: 7 (ref 5–15)
BUN: 9 mg/dL (ref 6–20)
CO2: 27 mmol/L (ref 22–32)
Calcium: 8.9 mg/dL (ref 8.9–10.3)
Chloride: 103 mmol/L (ref 98–111)
Creatinine, Ser: 0.75 mg/dL (ref 0.44–1.00)
GFR, Estimated: >60 mL/min (ref >60)
Glucose, Bld: 98 mg/dL (ref 70–99)
Potassium: 3 mmol/L — ABNORMAL LOW (ref 3.5–5.1)
Sodium: 137 mmol/L (ref 135–145)
Total Bilirubin: 0.3 mg/dL (ref 0.0–1.2)
Total Protein: 6.7 g/dL (ref 6.5–8.1)

## 2024-01-14 LAB — CBC WITH DIFFERENTIAL/PLATELET
Abs Immature Granulocytes: 0.03 10*3/uL (ref 0.00–0.07)
Basophils Absolute: 0 10*3/uL (ref 0.0–0.1)
Basophils Relative: 0 %
Eosinophils Absolute: 0.1 10*3/uL (ref 0.0–0.5)
Eosinophils Relative: 1 %
HCT: 35.8 % — ABNORMAL LOW (ref 36.0–46.0)
Hemoglobin: 12.2 g/dL (ref 12.0–15.0)
Immature Granulocytes: 0 %
Lymphocytes Relative: 24 %
Lymphs Abs: 2.6 10*3/uL (ref 0.7–4.0)
MCH: 31 pg (ref 26.0–34.0)
MCHC: 34.1 g/dL (ref 30.0–36.0)
MCV: 90.9 fL (ref 80.0–100.0)
Monocytes Absolute: 1 10*3/uL (ref 0.1–1.0)
Monocytes Relative: 9 %
Neutro Abs: 7 10*3/uL (ref 1.7–7.7)
Neutrophils Relative %: 66 %
Platelets: 282 10*3/uL (ref 150–400)
RBC: 3.94 MIL/uL (ref 3.87–5.11)
RDW: 12.4 % (ref 11.5–15.5)
WBC: 10.7 10*3/uL — ABNORMAL HIGH (ref 4.0–10.5)
nRBC: 0 % (ref 0.0–0.2)

## 2024-01-14 LAB — URINALYSIS, ROUTINE W REFLEX MICROSCOPIC
Bilirubin Urine: NEGATIVE
Glucose, UA: NEGATIVE mg/dL
Hgb urine dipstick: NEGATIVE
Ketones, ur: NEGATIVE mg/dL
Leukocytes,Ua: NEGATIVE
Nitrite: NEGATIVE
Protein, ur: NEGATIVE mg/dL
Specific Gravity, Urine: 1.002 — ABNORMAL LOW (ref 1.005–1.030)
pH: 7 (ref 5.0–8.0)

## 2024-01-14 LAB — RESP PANEL BY RT-PCR (RSV, FLU A&B, COVID)  RVPGX2
Influenza A by PCR: NEGATIVE
Influenza B by PCR: NEGATIVE
Resp Syncytial Virus by PCR: NEGATIVE
SARS Coronavirus 2 by RT PCR: NEGATIVE

## 2024-01-14 LAB — HCG, QUANTITATIVE, PREGNANCY: hCG, Beta Chain, Quant, S: <1 m[IU]/mL (ref <5)

## 2024-01-14 LAB — TROPONIN I (HIGH SENSITIVITY): Troponin I (High Sensitivity): <2 ng/L (ref <18)

## 2024-01-14 MED ORDER — ONDANSETRON 4 MG PO TBDP: 4.0000 mg | ORAL_TABLET | Freq: Three times a day (TID) | ORAL | 0 refills | Status: AC | PRN

## 2024-01-14 MED ORDER — POTASSIUM CHLORIDE 20 MEQ PO PACK
40.0000 meq | PACK | Freq: Once | ORAL | Status: AC
  Administered 2024-01-14: 40 meq via ORAL
  Filled 2024-01-14: qty 2

## 2024-01-14 MED ORDER — LEVETIRACETAM ER 500 MG PO TB24
1000.0000 mg | ORAL_TABLET | Freq: Every evening | ORAL | 3 refills | Status: DC
  Filled 2024-01-14: qty 60, 30d supply, fill #0

## 2024-01-14 MED ORDER — ONDANSETRON HCL 4 MG/2ML IJ SOLN
4.0000 mg | Freq: Once | INTRAMUSCULAR | Status: AC
  Administered 2024-01-14: 4 mg via INTRAVENOUS
  Filled 2024-01-14: qty 2

## 2024-01-14 MED ORDER — LEVETIRACETAM IN NACL 1500 MG/100ML IV SOLN: 1500.0000 mg | Freq: Once | INTRAVENOUS | Status: DC

## 2024-01-14 MED ORDER — POTASSIUM CHLORIDE CRYS ER 20 MEQ PO TBCR: 20.0000 meq | EXTENDED_RELEASE_TABLET | Freq: Two times a day (BID) | ORAL | 0 refills | Status: AC

## 2024-01-14 MED ORDER — LEVETIRACETAM IN NACL 1000 MG/100ML IV SOLN
1000.0000 mg | Freq: Once | INTRAVENOUS | Status: AC
  Administered 2024-01-14: 1000 mg via INTRAVENOUS
  Filled 2024-01-14: qty 100

## 2024-01-14 MED ORDER — ACETAMINOPHEN 500 MG PO TABS
1000.0000 mg | ORAL_TABLET | Freq: Once | ORAL | Status: AC
  Administered 2024-01-14: 1000 mg via ORAL
  Filled 2024-01-14: qty 2

## 2024-01-14 MED ORDER — SODIUM CHLORIDE 0.9 % IV BOLUS
1000.0000 mL | Freq: Once | INTRAVENOUS | Status: AC
  Administered 2024-01-14: 1000 mL via INTRAVENOUS

## 2024-01-14 NOTE — Discharge Instructions (Addendum)
 Please follow-up with your primary care doctor, and your neurologist.  It is very important that you take your Keppra, I know you cannot afford it, I placed a social work consult and, and they will call you in the a.m., to help with medication obtainment.  Return to the ER if you have another seizure, have intractable nausea, or vomiting.  I did not send the Keppra to your pharmacy today, as you have already have a prescription. Try goodRx.com for coupons for your keppra for affordability.

## 2024-01-14 NOTE — Telephone Encounter (Signed)
 Contacted patient regarding medication assistance.  Patient states she has had an issue filling her Keppra prescription she received from her primary care doctor. Suggested that she contact the office tomorrow morning with this concern. Patient discharge prescription were sent to [facility] [place], patient lives in [place], contacted the [place] on [address] where they were transferred. No further needs verbalized.

## 2024-01-14 NOTE — ED Triage Notes (Addendum)
 Pt arrives via EMS from home co N/V, chills, and weakness x 3 days. Started taking tramadol on Friday, states started feeling bad that night. States she may have had a seizure yesterday. Received NS, 4mg  Zofran en route. 18G LAC Hx seizures

## 2024-01-14 NOTE — ED Provider Notes (Signed)
 [place] EMERGENCY DEPARTMENT AT [facility] Provider Note   CSN: [id number] Arrival date & time: 01/14/24  1710     History  Chief Complaint  Patient presents with   Altered Mental Status   Weakness   Emesis    Tabitha Smith is a 33 y.o. female, hx of CHF, sepsis, seizures, CVA, who presents to the ED 2/2 to not feeling well for the last few days.  She states she has not been feeling well the last few days, and has had some nausea, some lower back pain.  Denies any fevers, chills, no loss of bowel, or bladder.  Was started on tramadol by her primary care doctor, and has had nausea since.  States that today she was having a very bad day, and stressed out.  Was telling her partner, how stressed out she was, when she immediately started vomiting.  It was nonbloody, emesis.  Partner states that after that, patient had a seizure for about a minute and a half.  Left arm was twitching, and patient was very tired, and confused afterwards.  Has not been taking her Keppra for the last 2 weeks, due to insurance issues.  Denies any kind of fevers, chills, urinary symptoms, or chest pain or shortness of breath. Denies any abdominal pain.  Home Medications Prior to Admission medications   Medication Sig Start Date End Date Taking? Authorizing Provider  ondansetron (ZOFRAN-ODT) 4 MG disintegrating tablet Take 1 tablet (4 mg total) by mouth every 8 (eight) hours as needed. 01/14/24  Yes Addaline Peplinski L, PA  potassium chloride SA (KLOR-CON M) 20 MEQ tablet Take 1 tablet (20 mEq total) by mouth 2 (two) times daily. 01/14/24  Yes Ronnika Collett L, PA  levETIRAcetam (KEPPRA XR) 500 MG 24 hr tablet TAKE 2 TABLETS(1000 MG) BY MOUTH AT BEDTIME 04/18/22   Anson Fret, MD  zolpidem (AMBIEN) 10 MG tablet Take 10 mg by mouth at bedtime as needed for sleep.    [provider]      Allergies    Amoxicillin, Bee venom, and Penicillins    Review of Systems   Review of Systems   Gastrointestinal:  Positive for vomiting. Negative for abdominal pain.  Neurological:  Positive for weakness.    Physical Exam Updated Vital Signs BP 119/61 (BP Location: Left Arm)   Pulse 67   Temp 98.3 F (36.8 C) (Oral)   Resp 16   SpO2 98%  Physical Exam Vitals and nursing note reviewed.  Constitutional:      General: She is not in acute distress.    Appearance: She is well-developed.  HENT:     Head: Normocephalic and atraumatic.  Eyes:     Conjunctiva/sclera: Conjunctivae normal.  Cardiovascular:     Rate and Rhythm: Normal rate and regular rhythm.     Heart sounds: No murmur heard. Pulmonary:     Effort: Pulmonary effort is normal. No respiratory distress.     Breath sounds: Normal breath sounds.  Abdominal:     Palpations: Abdomen is soft.     Tenderness: There is no abdominal tenderness.  Musculoskeletal:        General: No swelling.     Cervical back: Neck supple.  Skin:    General: Skin is warm and dry.     Capillary Refill: Capillary refill takes less than 2 seconds.  Neurological:     General: No focal deficit present.     Mental Status: She is alert.  Comments: Post-ictal  Psychiatric:        Mood and Affect: Mood normal.     ED Results / Procedures / Treatments   Labs (all labs ordered are listed, but only abnormal results are displayed) Labs Reviewed  CBC WITH DIFFERENTIAL/PLATELET - Abnormal; Notable for the following components:      Result Value   WBC 10.7 (*)    HCT 35.8 (*)    All other components within normal limits  COMPREHENSIVE METABOLIC PANEL - Abnormal; Notable for the following components:   Potassium 3.0 (*)    All other components within normal limits  URINALYSIS, ROUTINE W REFLEX MICROSCOPIC - Abnormal; Notable for the following components:   Color, Urine COLORLESS (*)    Specific Gravity, Urine 1.002 (*)    All other components within normal limits  RESP PANEL BY RT-PCR (RSV, FLU A&B, COVID)  RVPGX2  HCG,  QUANTITATIVE, PREGNANCY  TROPONIN I (HIGH SENSITIVITY)  TROPONIN I (HIGH SENSITIVITY)    EKG None  Radiology DG Chest 2 View Result Date: 01/14/2024 CLINICAL DATA:  Nausea, vomiting, seizure EXAM: CHEST - 2 VIEW COMPARISON:  Radiograph and CT 01/15/2023 FINDINGS: The heart size and mediastinal contours are within normal limits. Both lungs are clear. The visualized skeletal structures are unremarkable. IMPRESSION: No active cardiopulmonary disease. Electronically Signed   By: Minerva Fester M.D.   On: 01/14/2024 19:49    Procedures Procedures    Medications Ordered in ED Medications  potassium chloride (KLOR-CON) packet 40 mEq (has no administration in time range)  sodium chloride 0.9 % bolus 1,000 mL (0 mLs Intravenous Stopped 01/14/24 2105)  ondansetron (ZOFRAN) injection 4 mg (4 mg Intravenous Given 01/14/24 1835)  levETIRAcetam (KEPPRA) IVPB 1000 mg/100 mL premix (0 mg Intravenous Stopped 01/14/24 1854)  acetaminophen (TYLENOL) tablet 1,000 mg (1,000 mg Oral Given 01/14/24 2036)    ED Course/ Medical Decision Making/ A&P                                 Medical Decision Making Patient is a 33 year old female, here for multiple complaints, including fatigue, nausea for the last few days, after having some back pain, and she is overall well-appearing, however has not been taking her seizure medications and had a seizure today.  She is postictal on my exam, however throughout the conversation, she became more alert.  Will obtain a blood work, EKG, urinalysis, COVID/flu, given her symptoms.  We will give her a bolus of Keppra and some fluids.  She has no focal deficits on exam  Amount and/or Complexity of Data Reviewed Labs: ordered.    Details: Unremarkable, urine within normal limits Radiology: ordered.    Details: Chest xray unremarkable ECG/medicine tests:     Details: NSR Discussion of management or test interpretation with external provider(s): Discussed with patient, her  blood work is unremarkable, I believe that her seizure is likely secondary to her noncompliance, with her medications, given difficulty affording it.  I recommend she follow-up, with her PCP, but is social work consulted, to call the patient to help with affordability.  Placed information for good Rx, and her paperwork, and advised to follow-up.  Provided potassium supplementation, for the patient, secondary to hypokalemia found in the ED.  Advised on return precautions, nausea, is likely secondary to the tramadol usage, and I advised her to take it with food.  She has been more stressed out, this may be resulting the  fatigue, no evidence of infection, Hu exam.  Discharged home  Risk OTC drugs. Prescription drug management.    Final Clinical Impression(s) / ED Diagnoses Final diagnoses:  Seizure (HCC)  Other fatigue  Nausea    Rx / DC Orders ED Discharge Orders          Ordered    ondansetron (ZOFRAN-ODT) 4 MG disintegrating tablet  Every 8 hours PRN        01/14/24 2105    potassium chloride SA (KLOR-CON M) 20 MEQ tablet  2 times daily        01/14/24 2113              Linnie Mcglocklin, Harley [place], PA 01/14/24 2115    Royanne Foots, DO 01/14/24 2347

## 2024-01-14 NOTE — Telephone Encounter (Cosign Needed)
 Initially was told to send to different pharmacy for med assistance, but canceled per SW instructions. They will be reaching out to the patient for further info

## 2024-01-15 ENCOUNTER — Other Ambulatory Visit (HOSPITAL_COMMUNITY): Payer: Self-pay
